# Patient Record
Sex: Male | Born: 1935
Health system: Southern US, Community
[De-identification: ages and names within clinical notes are randomized; demographics above are authoritative.]

## PROBLEM LIST (undated history)

## (undated) DIAGNOSIS — I452 Bifascicular block: Secondary | ICD-10-CM

## (undated) DIAGNOSIS — D649 Anemia, unspecified: Secondary | ICD-10-CM

## (undated) DIAGNOSIS — G4733 Obstructive sleep apnea (adult) (pediatric): Secondary | ICD-10-CM

## (undated) DIAGNOSIS — K5792 Diverticulitis of intestine, part unspecified, without perforation or abscess without bleeding: Secondary | ICD-10-CM

## (undated) DIAGNOSIS — R519 Headache, unspecified: Secondary | ICD-10-CM

## (undated) DIAGNOSIS — D509 Iron deficiency anemia, unspecified: Secondary | ICD-10-CM

## (undated) DIAGNOSIS — H269 Unspecified cataract: Secondary | ICD-10-CM

## (undated) DIAGNOSIS — C61 Malignant neoplasm of prostate: Secondary | ICD-10-CM

## (undated) DIAGNOSIS — H81319 Aural vertigo, unspecified ear: Secondary | ICD-10-CM

## (undated) DIAGNOSIS — K31819 Angiodysplasia of stomach and duodenum without bleeding: Secondary | ICD-10-CM

## (undated) DIAGNOSIS — K649 Unspecified hemorrhoids: Secondary | ICD-10-CM

## (undated) DIAGNOSIS — D126 Benign neoplasm of colon, unspecified: Secondary | ICD-10-CM

## (undated) DIAGNOSIS — I208 Other forms of angina pectoris: Secondary | ICD-10-CM

## (undated) DIAGNOSIS — E785 Hyperlipidemia, unspecified: Secondary | ICD-10-CM

## (undated) DIAGNOSIS — Z85828 Personal history of other malignant neoplasm of skin: Secondary | ICD-10-CM

## (undated) DIAGNOSIS — I2089 Other forms of angina pectoris: Secondary | ICD-10-CM

## (undated) DIAGNOSIS — K635 Polyp of colon: Secondary | ICD-10-CM

## (undated) DIAGNOSIS — M109 Gout, unspecified: Secondary | ICD-10-CM

## (undated) DIAGNOSIS — I5189 Other ill-defined heart diseases: Secondary | ICD-10-CM

## (undated) DIAGNOSIS — C44629 Squamous cell carcinoma of skin of left upper limb, including shoulder: Secondary | ICD-10-CM

## (undated) DIAGNOSIS — E861 Hypovolemia: Secondary | ICD-10-CM

## (undated) DIAGNOSIS — N529 Male erectile dysfunction, unspecified: Secondary | ICD-10-CM

## (undated) DIAGNOSIS — R0609 Other forms of dyspnea: Secondary | ICD-10-CM

## (undated) DIAGNOSIS — C4491 Basal cell carcinoma of skin, unspecified: Secondary | ICD-10-CM

## (undated) DIAGNOSIS — I700 Atherosclerosis of aorta: Secondary | ICD-10-CM

## (undated) DIAGNOSIS — I10 Essential (primary) hypertension: Secondary | ICD-10-CM

## (undated) DIAGNOSIS — K6389 Other specified diseases of intestine: Secondary | ICD-10-CM

## (undated) DIAGNOSIS — M791 Myalgia, unspecified site: Secondary | ICD-10-CM

## (undated) DIAGNOSIS — R7303 Prediabetes: Secondary | ICD-10-CM

## (undated) DIAGNOSIS — C44622 Squamous cell carcinoma of skin of right upper limb, including shoulder: Secondary | ICD-10-CM

## (undated) DIAGNOSIS — I2510 Atherosclerotic heart disease of native coronary artery without angina pectoris: Secondary | ICD-10-CM

## (undated) DIAGNOSIS — T466X5A Adverse effect of antihyperlipidemic and antiarteriosclerotic drugs, initial encounter: Secondary | ICD-10-CM

## (undated) HISTORY — DX: Hypovolemia: E86.1

## (undated) HISTORY — PX: OTHER SURGICAL HISTORY: SHX169

## (undated) HISTORY — DX: Polyp of colon: K63.5

## (undated) HISTORY — PX: CARPECTOMY: SHX5004

## (undated) HISTORY — PX: COLON SURGERY: SHX602

## (undated) HISTORY — DX: Headache, unspecified: R51.9

## (undated) HISTORY — PX: CARDIOVASCULAR STRESS TEST: SHX262

## (undated) HISTORY — DX: Diverticulitis of intestine, part unspecified, without perforation or abscess without bleeding: K57.92

## (undated) HISTORY — DX: Squamous cell carcinoma of skin of left upper limb, including shoulder: C44.629

## (undated) HISTORY — DX: Other specified diseases of intestine: K63.89

## (undated) HISTORY — PX: FOOT SURGERY: SHX648

## (undated) HISTORY — PX: CATARACT EXTRACTION W/ INTRAOCULAR LENS  IMPLANT, BILATERAL: SHX1307

## (undated) HISTORY — PX: CARDIAC SURGERY: SHX584

---
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
## 1984-03-27 HISTORY — PX: KNEE ARTHROSCOPY: SUR90

## 1997-03-27 HISTORY — PX: APPENDECTOMY: SHX54

## 1998-12-24 ENCOUNTER — Ambulatory Visit (HOSPITAL_BASED_OUTPATIENT_CLINIC_OR_DEPARTMENT_OTHER): Admission: RE | Admit: 1998-12-24 | Discharge: 1998-12-24 | Payer: Self-pay | Admitting: Orthopedic Surgery

## 2001-06-28 HISTORY — PX: BILATERAL CARPAL TUNNEL RELEASE: SHX6508

## 2001-06-28 HISTORY — PX: OLECRANON BURSA EXCISION: SUR541

## 2003-09-25 DIAGNOSIS — I214 Non-ST elevation (NSTEMI) myocardial infarction: Secondary | ICD-10-CM

## 2003-09-25 DIAGNOSIS — I25118 Atherosclerotic heart disease of native coronary artery with other forms of angina pectoris: Secondary | ICD-10-CM

## 2003-09-25 DIAGNOSIS — I2510 Atherosclerotic heart disease of native coronary artery without angina pectoris: Secondary | ICD-10-CM | POA: Insufficient documentation

## 2003-09-25 DIAGNOSIS — I252 Old myocardial infarction: Secondary | ICD-10-CM

## 2003-09-25 DIAGNOSIS — I25759 Atherosclerosis of native coronary artery of transplanted heart with unspecified angina pectoris: Secondary | ICD-10-CM

## 2003-09-25 HISTORY — DX: Non-ST elevation (NSTEMI) myocardial infarction: I21.4

## 2003-09-25 HISTORY — DX: Old myocardial infarction: I25.2

## 2003-09-25 HISTORY — DX: Atherosclerosis of native coronary artery of transplanted heart with unspecified angina pectoris: I25.759

## 2003-09-25 HISTORY — DX: Atherosclerotic heart disease of native coronary artery with other forms of angina pectoris: I25.118

## 2003-10-18 HISTORY — PX: LEFT HEART CATH AND CORONARY ANGIOGRAPHY: CATH118249

## 2003-10-19 DIAGNOSIS — Z951 Presence of aortocoronary bypass graft: Secondary | ICD-10-CM

## 2003-10-19 HISTORY — DX: Presence of aortocoronary bypass graft: Z95.1

## 2003-10-19 HISTORY — PX: CORONARY ARTERY BYPASS GRAFT: SHX141

## 2003-10-19 HISTORY — PX: LEFT HEART CATH AND CORONARY ANGIOGRAPHY: CATH118249

## 2004-04-14 ENCOUNTER — Inpatient Hospital Stay: Payer: Self-pay | Admitting: General Practice

## 2004-04-14 HISTORY — PX: TOTAL KNEE ARTHROPLASTY: SHX125

## 2004-04-20 ENCOUNTER — Ambulatory Visit: Payer: Self-pay | Admitting: Internal Medicine

## 2004-10-13 ENCOUNTER — Ambulatory Visit: Payer: Self-pay | Admitting: Ophthalmology

## 2004-10-18 ENCOUNTER — Ambulatory Visit: Payer: Self-pay | Admitting: Ophthalmology

## 2006-07-09 ENCOUNTER — Ambulatory Visit (HOSPITAL_COMMUNITY): Admission: RE | Admit: 2006-07-09 | Discharge: 2006-07-09 | Payer: Self-pay | Admitting: Orthopedic Surgery

## 2006-12-04 ENCOUNTER — Ambulatory Visit: Payer: Self-pay | Admitting: General Surgery

## 2006-12-31 DIAGNOSIS — C4491 Basal cell carcinoma of skin, unspecified: Secondary | ICD-10-CM

## 2006-12-31 HISTORY — DX: Basal cell carcinoma of skin, unspecified: C44.91

## 2007-04-16 ENCOUNTER — Ambulatory Visit: Payer: Self-pay | Admitting: Ophthalmology

## 2007-06-07 ENCOUNTER — Ambulatory Visit: Payer: Self-pay | Admitting: General Practice

## 2007-06-07 ENCOUNTER — Other Ambulatory Visit: Payer: Self-pay

## 2007-06-24 ENCOUNTER — Ambulatory Visit: Payer: Self-pay | Admitting: General Practice

## 2007-06-24 HISTORY — PX: OLECRANON BURSA EXCISION: SUR541

## 2008-08-12 ENCOUNTER — Emergency Department: Payer: Self-pay | Admitting: Unknown Physician Specialty

## 2009-08-04 ENCOUNTER — Ambulatory Visit: Payer: Self-pay | Admitting: General Practice

## 2009-08-25 ENCOUNTER — Ambulatory Visit: Payer: Self-pay | Admitting: General Practice

## 2009-09-06 ENCOUNTER — Ambulatory Visit: Payer: Self-pay | Admitting: General Practice

## 2010-11-08 ENCOUNTER — Ambulatory Visit: Payer: Self-pay | Admitting: General Practice

## 2010-11-08 DIAGNOSIS — I2510 Atherosclerotic heart disease of native coronary artery without angina pectoris: Secondary | ICD-10-CM

## 2010-11-21 ENCOUNTER — Inpatient Hospital Stay: Payer: Self-pay | Admitting: General Practice

## 2010-11-24 ENCOUNTER — Encounter: Payer: Self-pay | Admitting: Internal Medicine

## 2010-11-24 HISTORY — PX: TRANSTHORACIC ECHOCARDIOGRAM: SHX275

## 2010-11-26 ENCOUNTER — Encounter: Payer: Self-pay | Admitting: Internal Medicine

## 2010-12-26 ENCOUNTER — Encounter: Payer: Self-pay | Admitting: Internal Medicine

## 2011-03-28 LAB — HM COLONOSCOPY

## 2011-03-29 DIAGNOSIS — R972 Elevated prostate specific antigen [PSA]: Secondary | ICD-10-CM | POA: Diagnosis not present

## 2011-03-29 DIAGNOSIS — N402 Nodular prostate without lower urinary tract symptoms: Secondary | ICD-10-CM | POA: Diagnosis not present

## 2011-03-30 DIAGNOSIS — H8110 Benign paroxysmal vertigo, unspecified ear: Secondary | ICD-10-CM | POA: Diagnosis not present

## 2011-04-05 DIAGNOSIS — R972 Elevated prostate specific antigen [PSA]: Secondary | ICD-10-CM | POA: Diagnosis not present

## 2011-04-05 DIAGNOSIS — N402 Nodular prostate without lower urinary tract symptoms: Secondary | ICD-10-CM | POA: Diagnosis not present

## 2011-08-31 DIAGNOSIS — I059 Rheumatic mitral valve disease, unspecified: Secondary | ICD-10-CM | POA: Diagnosis not present

## 2011-08-31 DIAGNOSIS — I209 Angina pectoris, unspecified: Secondary | ICD-10-CM | POA: Diagnosis not present

## 2011-08-31 DIAGNOSIS — I2510 Atherosclerotic heart disease of native coronary artery without angina pectoris: Secondary | ICD-10-CM | POA: Diagnosis not present

## 2011-09-07 DIAGNOSIS — I209 Angina pectoris, unspecified: Secondary | ICD-10-CM | POA: Diagnosis not present

## 2011-09-15 DIAGNOSIS — H10509 Unspecified blepharoconjunctivitis, unspecified eye: Secondary | ICD-10-CM | POA: Diagnosis not present

## 2011-09-22 DIAGNOSIS — M19019 Primary osteoarthritis, unspecified shoulder: Secondary | ICD-10-CM | POA: Diagnosis not present

## 2011-10-04 DIAGNOSIS — R972 Elevated prostate specific antigen [PSA]: Secondary | ICD-10-CM | POA: Diagnosis not present

## 2011-10-04 DIAGNOSIS — N402 Nodular prostate without lower urinary tract symptoms: Secondary | ICD-10-CM | POA: Diagnosis not present

## 2011-10-05 DIAGNOSIS — H43819 Vitreous degeneration, unspecified eye: Secondary | ICD-10-CM | POA: Diagnosis not present

## 2011-10-11 DIAGNOSIS — N402 Nodular prostate without lower urinary tract symptoms: Secondary | ICD-10-CM | POA: Diagnosis not present

## 2011-10-11 DIAGNOSIS — R972 Elevated prostate specific antigen [PSA]: Secondary | ICD-10-CM | POA: Diagnosis not present

## 2011-10-25 DIAGNOSIS — J018 Other acute sinusitis: Secondary | ICD-10-CM | POA: Diagnosis not present

## 2011-10-25 DIAGNOSIS — H8110 Benign paroxysmal vertigo, unspecified ear: Secondary | ICD-10-CM | POA: Diagnosis not present

## 2011-11-02 DIAGNOSIS — Z96659 Presence of unspecified artificial knee joint: Secondary | ICD-10-CM | POA: Diagnosis not present

## 2011-12-22 DIAGNOSIS — H8110 Benign paroxysmal vertigo, unspecified ear: Secondary | ICD-10-CM | POA: Diagnosis not present

## 2011-12-29 DIAGNOSIS — M129 Arthropathy, unspecified: Secondary | ICD-10-CM | POA: Diagnosis not present

## 2011-12-29 DIAGNOSIS — M109 Gout, unspecified: Secondary | ICD-10-CM | POA: Diagnosis not present

## 2011-12-29 DIAGNOSIS — R42 Dizziness and giddiness: Secondary | ICD-10-CM | POA: Diagnosis not present

## 2011-12-29 DIAGNOSIS — I2510 Atherosclerotic heart disease of native coronary artery without angina pectoris: Secondary | ICD-10-CM | POA: Diagnosis not present

## 2011-12-29 DIAGNOSIS — M19049 Primary osteoarthritis, unspecified hand: Secondary | ICD-10-CM | POA: Diagnosis not present

## 2012-01-03 DIAGNOSIS — M109 Gout, unspecified: Secondary | ICD-10-CM | POA: Diagnosis not present

## 2012-01-03 DIAGNOSIS — M6530 Trigger finger, unspecified finger: Secondary | ICD-10-CM | POA: Diagnosis not present

## 2012-01-03 DIAGNOSIS — M25549 Pain in joints of unspecified hand: Secondary | ICD-10-CM | POA: Diagnosis not present

## 2012-01-11 ENCOUNTER — Encounter: Payer: Self-pay | Admitting: Rheumatology

## 2012-01-11 DIAGNOSIS — IMO0001 Reserved for inherently not codable concepts without codable children: Secondary | ICD-10-CM | POA: Diagnosis not present

## 2012-01-11 DIAGNOSIS — M109 Gout, unspecified: Secondary | ICD-10-CM | POA: Diagnosis not present

## 2012-01-11 DIAGNOSIS — M25649 Stiffness of unspecified hand, not elsewhere classified: Secondary | ICD-10-CM | POA: Diagnosis not present

## 2012-01-18 DIAGNOSIS — IMO0001 Reserved for inherently not codable concepts without codable children: Secondary | ICD-10-CM | POA: Diagnosis not present

## 2012-01-18 DIAGNOSIS — M25649 Stiffness of unspecified hand, not elsewhere classified: Secondary | ICD-10-CM | POA: Diagnosis not present

## 2012-01-18 DIAGNOSIS — M109 Gout, unspecified: Secondary | ICD-10-CM | POA: Diagnosis not present

## 2012-01-25 DIAGNOSIS — IMO0001 Reserved for inherently not codable concepts without codable children: Secondary | ICD-10-CM | POA: Diagnosis not present

## 2012-01-25 DIAGNOSIS — M109 Gout, unspecified: Secondary | ICD-10-CM | POA: Diagnosis not present

## 2012-01-25 DIAGNOSIS — M25649 Stiffness of unspecified hand, not elsewhere classified: Secondary | ICD-10-CM | POA: Diagnosis not present

## 2012-03-07 DIAGNOSIS — I10 Essential (primary) hypertension: Secondary | ICD-10-CM | POA: Diagnosis not present

## 2012-03-07 DIAGNOSIS — I2510 Atherosclerotic heart disease of native coronary artery without angina pectoris: Secondary | ICD-10-CM | POA: Diagnosis not present

## 2012-03-07 DIAGNOSIS — E782 Mixed hyperlipidemia: Secondary | ICD-10-CM | POA: Diagnosis not present

## 2012-03-07 DIAGNOSIS — G4730 Sleep apnea, unspecified: Secondary | ICD-10-CM | POA: Diagnosis not present

## 2012-03-28 DIAGNOSIS — M999 Biomechanical lesion, unspecified: Secondary | ICD-10-CM | POA: Diagnosis not present

## 2012-03-28 DIAGNOSIS — M5380 Other specified dorsopathies, site unspecified: Secondary | ICD-10-CM | POA: Diagnosis not present

## 2012-03-28 DIAGNOSIS — M533 Sacrococcygeal disorders, not elsewhere classified: Secondary | ICD-10-CM | POA: Diagnosis not present

## 2012-03-29 DIAGNOSIS — M5380 Other specified dorsopathies, site unspecified: Secondary | ICD-10-CM | POA: Diagnosis not present

## 2012-03-29 DIAGNOSIS — M533 Sacrococcygeal disorders, not elsewhere classified: Secondary | ICD-10-CM | POA: Diagnosis not present

## 2012-03-29 DIAGNOSIS — M999 Biomechanical lesion, unspecified: Secondary | ICD-10-CM | POA: Diagnosis not present

## 2012-04-10 DIAGNOSIS — R972 Elevated prostate specific antigen [PSA]: Secondary | ICD-10-CM | POA: Diagnosis not present

## 2012-04-18 DIAGNOSIS — R972 Elevated prostate specific antigen [PSA]: Secondary | ICD-10-CM | POA: Diagnosis not present

## 2012-04-18 DIAGNOSIS — N402 Nodular prostate without lower urinary tract symptoms: Secondary | ICD-10-CM | POA: Diagnosis not present

## 2012-05-08 DIAGNOSIS — Z8546 Personal history of malignant neoplasm of prostate: Secondary | ICD-10-CM

## 2012-05-08 DIAGNOSIS — R972 Elevated prostate specific antigen [PSA]: Secondary | ICD-10-CM | POA: Diagnosis not present

## 2012-05-08 DIAGNOSIS — C61 Malignant neoplasm of prostate: Secondary | ICD-10-CM

## 2012-05-08 DIAGNOSIS — IMO0002 Reserved for concepts with insufficient information to code with codable children: Secondary | ICD-10-CM | POA: Diagnosis not present

## 2012-05-08 HISTORY — DX: Malignant neoplasm of prostate: C61

## 2012-05-08 HISTORY — DX: Personal history of malignant neoplasm of prostate: Z85.46

## 2012-05-15 DIAGNOSIS — C61 Malignant neoplasm of prostate: Secondary | ICD-10-CM | POA: Diagnosis not present

## 2012-05-23 DIAGNOSIS — C61 Malignant neoplasm of prostate: Secondary | ICD-10-CM | POA: Diagnosis not present

## 2012-06-05 ENCOUNTER — Encounter: Payer: Self-pay | Admitting: Radiation Oncology

## 2012-06-05 ENCOUNTER — Ambulatory Visit
Admission: RE | Admit: 2012-06-05 | Discharge: 2012-06-05 | Disposition: A | Discharge status: Home / Self Care | Payer: Medicare Other | Source: Ambulatory Visit | Attending: Radiation Oncology | Admitting: Radiation Oncology

## 2012-06-05 VITALS — BP 124/75 | HR 75 | Temp 98.8°F | Resp 20 | Ht 70.5 in | Wt 232.7 lb

## 2012-06-05 DIAGNOSIS — Z951 Presence of aortocoronary bypass graft: Secondary | ICD-10-CM | POA: Insufficient documentation

## 2012-06-05 DIAGNOSIS — I252 Old myocardial infarction: Secondary | ICD-10-CM | POA: Diagnosis not present

## 2012-06-05 DIAGNOSIS — Z79899 Other long term (current) drug therapy: Secondary | ICD-10-CM | POA: Diagnosis not present

## 2012-06-05 DIAGNOSIS — I10 Essential (primary) hypertension: Secondary | ICD-10-CM | POA: Insufficient documentation

## 2012-06-05 DIAGNOSIS — E785 Hyperlipidemia, unspecified: Secondary | ICD-10-CM | POA: Insufficient documentation

## 2012-06-05 DIAGNOSIS — C61 Malignant neoplasm of prostate: Secondary | ICD-10-CM | POA: Insufficient documentation

## 2012-06-05 HISTORY — DX: Hyperlipidemia, unspecified: E78.5

## 2012-06-05 HISTORY — DX: Malignant neoplasm of prostate: C61

## 2012-06-05 HISTORY — DX: Male erectile dysfunction, unspecified: N52.9

## 2012-06-05 NOTE — Progress Notes (Addendum)
New Consult Prostate Adenocarcinoma Bx 05/08/12 Gleason 3+3=6,PSA=8.26,Volume=57cc  Elevated PSA 9/11= 6.5, 10/11=4.75 s/p antibiotic  Interested in [employer]plantation  Divorced,  Lives with significant other 29 years,Retired, 1 son, no dysuria, gets up 1x night, regular bowel movements, sometimes sciatic pain at night, no nausea,   Allergies:Indocin=tremors,PCN=itching,rash

## 2012-06-05 NOTE — Progress Notes (Signed)
Radiation Oncology         [phone]) [phone] ________________________________  Initial outpatient Consultation  Name: Phillip Nelson MRN: [id number]  Date: 06/05/2012  DOB: [birth year]  CC:No primary provider on file.  Garnett Farm, MD   REFERRING PHYSICIAN: Garnett Farm, MD  DIAGNOSIS: 78 y.o. gentleman with stage T2a adenocarcinoma of the prostate with a Gleason's score of 3+3 and a PSA of 8.26  HISTORY OF PRESENT ILLNESS::Phillip Nelson is a 78 y.o. gentleman.  He was noted to have an elevated PSA of 6.5 in September 2011. After a course of antibiotics, his PSA decreased to 4.75. Therefore, he was carefully monitored by his primary care physician, Dr. Lonna Cobb.  Most recently, the patient was noted to have some firmness in the left side of the prostate.  Accordingly, he was referred for evaluation in urology by Dr. Vernie Ammons on 10/11/2011,  digital rectal examination was performed at that time revealing very subtle firmness along the left lateral aspect of the left lobe of the mid prostate.  The patient proceeded to transrectal ultrasound with 12 biopsies of the prostate on 05/08/2012.  The prostate volume measured 57 cc.  Out of 12 core biopsies, 5 were positive.  The maximum Gleason score was 3+3, and this was seen in the right lateral mid, right lateral apex, left apex, left lateral mid, and left lateral apex in quantities ranging from 5% to 80%.  The patient reviewed the biopsy results with his urologist and he has kindly been referred today for discussion of potential radiation treatment options.  PREVIOUS RADIATION THERAPY: No  PAST MEDICAL HISTORY:  has a past medical history of Prostate cancer (05/08/12); Arthritis; Vertigo; CABG; Hypertension; Gout; Hyperlipidemia; Osteoarthritis, knee; Skin cancer; Status post foot surgery; History of knee replacement, total; Cataract; ED (erectile dysfunction); Allergy; and Myocardial infarction (2005).    PAST SURGICAL HISTORY: Past Surgical  History  Procedure Laterality Date  . Appendectomy    . Arthoscopic elbow Left   . Arthoscopic knee Left   . Carpectomy Right     wrist  . Cataract surgery Bilateral   . Coronary artery bypass graft  2005    FAMILY HISTORY: family history includes Alzheimer's disease in his father and Cancer (age of onset: 51) in his sister.  SOCIAL HISTORY:  reports that he has quit smoking. He has never used smokeless tobacco. He reports that  drinks alcohol. He reports that he does not use illicit drugs.  ALLERGIES: Indocin and Penicillins  MEDICATIONS:  Current Outpatient Prescriptions  Medication Sig Dispense Refill  . allopurinol (ZYLOPRIM) 300 MG tablet Take 300 mg by mouth daily.      . meclizine (ANTIVERT) 25 MG tablet Take 25 mg by mouth as needed.      . naproxen sodium (ANAPROX) 220 MG tablet Take 220 mg by mouth 2 (two) times daily with a meal.      . Potassium 99 MG TABS Take 5 tablets by mouth daily. OTC      . vitamin B-12 (CYANOCOBALAMIN) 100 MCG tablet Take 50 mcg by mouth as needed.       No current facility-administered medications for this encounter.    REVIEW OF SYSTEMS:  A 15 point review of systems is documented in the electronic medical record. This was obtained by the nursing staff. However, I reviewed this with the patient to discuss relevant findings and make appropriate changes.  A comprehensive review of systems was negative..  The patient completed an IPSS and IIEF questionnaire.  His IPSS score was 6 indicating mild urinary outflow obstructive symptoms.  He indicated that his erectile function is unable to initiate sexual activity.   PHYSICAL EXAM: This patient is in no acute distress.  He is alert and oriented.   height is 5' 10.5" (1.791 m) and weight is 232 lb 11.2 oz (105.552 kg). His oral temperature is 98.8 F (37.1 C). His blood pressure is 124/75 and his pulse is 75. His respiration is 20.  He exhibits no respiratory distress or labored breathing.  He appears  neurologically intact.  His mood is pleasant.  His affect is appropriate.  Please note the digital rectal exam findings described above.  LABORATORY DATA:  No results found for this basename: WBC, HGB, HCT, MCV, PLT   No results found for this basename: NA, K, CL, CO2   No results found for this basename: ALT, AST, GGT, ALKPHOS, BILITOT     RADIOGRAPHY: No results found.    IMPRESSION: This gentleman is a 78 y.o. gentleman with stage T2a adenocarcinoma of the prostate with a Gleason's score of 3+3 and a PSA of 8.26.  His T-Stage, Gleason's Score, and PSA put him into the favorable risk group.  Accordingly he is eligible for a variety of potential treatment options including active surveillance, prostatectomy, external radiation or seed implant.  PLAN:Today I reviewed the findings and workup thus far.  We discussed the natural history of prostate cancer.  We reviewed the the implications of T-stage, Gleason's Score, and PSA on decision-making and outcomes in prostate cancer.  We discussed radiation treatment in the management of prostate cancer with regard to the logistics and delivery of external beam radiation treatment as well as the logistics and delivery of prostate brachytherapy.  We compared and contrasted each of these approaches and also compared these against prostatectomy.  The patient expressed interest in prostate brachytherapy.  I filled out a patient counseling form for him with relevant treatment diagrams and we retained a copy for our records.   The patient would like to proceed with prostate brachytherapy.  I will share my findings with Dr. Vernie Ammons and move forward with scheduling the procedure in the near future.     I enjoyed meeting with him today, and will look forward to participating in the care of this very nice gentleman.  I spent 60 minutes face to face with the patient and more than 50% of that time was spent in counseling and/or coordination of care.     ------------------------------------------------  Artist Pais. Kathrynn Running, M.D.

## 2012-06-05 NOTE — Progress Notes (Signed)
Please see the Nurse Progress Note in the MD Initial Consult Encounter for this patient. 

## 2012-06-06 ENCOUNTER — Other Ambulatory Visit: Payer: Self-pay | Admitting: Urology

## 2012-06-06 ENCOUNTER — Telehealth: Payer: Self-pay | Admitting: *Deleted

## 2012-06-06 NOTE — Telephone Encounter (Signed)
Called patient to inform of appts and implant , spoke with patient and he is aware of these appts. And implant.

## 2012-06-20 ENCOUNTER — Telehealth: Payer: Self-pay | Admitting: *Deleted

## 2012-06-20 NOTE — Telephone Encounter (Signed)
CALLED PATIENT TO REMIND OF APPT. FOR 06-21-12, CONFIRMED APPT. WITH PATIENT.

## 2012-06-21 ENCOUNTER — Ambulatory Visit
Admission: RE | Admit: 2012-06-21 | Discharge: 2012-06-21 | Disposition: A | Discharge status: Home / Self Care | Payer: Medicare Other | Source: Ambulatory Visit | Attending: Radiation Oncology | Admitting: Radiation Oncology

## 2012-06-21 ENCOUNTER — Ambulatory Visit (HOSPITAL_COMMUNITY)
Admission: RE | Admit: 2012-06-21 | Discharge: 2012-06-21 | Disposition: A | Discharge status: Home / Self Care | Payer: Medicare Other | Source: Ambulatory Visit | Attending: Urology | Admitting: Urology

## 2012-06-21 DIAGNOSIS — Z01818 Encounter for other preprocedural examination: Secondary | ICD-10-CM | POA: Insufficient documentation

## 2012-06-21 DIAGNOSIS — C61 Malignant neoplasm of prostate: Secondary | ICD-10-CM | POA: Diagnosis not present

## 2012-06-21 DIAGNOSIS — Z87891 Personal history of nicotine dependence: Secondary | ICD-10-CM | POA: Diagnosis not present

## 2012-06-21 NOTE — Progress Notes (Signed)
  Radiation Oncology         [phone] ________________________________  Name: Phillip Nelson MRN: [id number]  Date: 06/21/2012  DOB: Aug 26, 1934  SIMULATION AND TREATMENT PLANNING NOTE PUBIC ARCH STUDY  CC:No primary provider on file.  Garnett Farm, MD  DIAGNOSIS: 78 y.o. gentleman with stage T2a adenocarcinoma of the prostate with a Gleason's score of 3+3 and a PSA of 8.26  COMPLEX SIMULATION:  The patient presented today for evaluation for possible prostate seed implant. He was brought to the radiation planning suite and placed supine on the CT couch. A 3-dimensional image study set was obtained in upload to the planning computer. There, on each axial slice, I contoured the prostate gland. Then, using three-dimensional radiation planning tools I reconstructed the prostate in view of the structures from the transperineal needle pathway to assess for possible pubic arch interference. In doing so, I did not appreciate any pubic arch interference. Also, the patient's prostate volume was estimated based on the drawn structure. The volume was 44.9 cc.  Given the pubic arch appearance and prostate volume, patient remains a good candidate to proceed with prostate seed implant. Today, he freely provided informed written consent to proceed.    PLAN: The patient will undergo prostate seed implant.   ________________________________  Artist Pais. Kathrynn Running, M.D.

## 2012-07-25 ENCOUNTER — Telehealth: Payer: Self-pay | Admitting: *Deleted

## 2012-07-25 NOTE — Telephone Encounter (Signed)
CALLED PATIENT TO REMIND OF APPT. FOR 07-26-12, SPOKE WITH PATIENT AND HE IS AWARE OF THIS APPT. 

## 2012-07-26 DIAGNOSIS — Z85828 Personal history of other malignant neoplasm of skin: Secondary | ICD-10-CM | POA: Diagnosis not present

## 2012-07-26 DIAGNOSIS — E785 Hyperlipidemia, unspecified: Secondary | ICD-10-CM | POA: Diagnosis not present

## 2012-07-26 DIAGNOSIS — I252 Old myocardial infarction: Secondary | ICD-10-CM | POA: Diagnosis not present

## 2012-07-26 DIAGNOSIS — C61 Malignant neoplasm of prostate: Secondary | ICD-10-CM | POA: Diagnosis not present

## 2012-07-26 DIAGNOSIS — Z79899 Other long term (current) drug therapy: Secondary | ICD-10-CM | POA: Diagnosis not present

## 2012-07-26 DIAGNOSIS — Z951 Presence of aortocoronary bypass graft: Secondary | ICD-10-CM | POA: Diagnosis not present

## 2012-07-26 DIAGNOSIS — I10 Essential (primary) hypertension: Secondary | ICD-10-CM | POA: Diagnosis not present

## 2012-07-26 LAB — COMPREHENSIVE METABOLIC PANEL
ALT: 41 U/L (ref 0–53)
AST: 40 U/L — ABNORMAL HIGH (ref 0–37)
Albumin: 4 g/dL (ref 3.5–5.2)
Alkaline Phosphatase: 74 U/L (ref 39–117)
BUN: 12 mg/dL (ref 6–23)
CO2: 25 mEq/L (ref 19–32)
Calcium: 9.8 mg/dL (ref 8.4–10.5)
Chloride: 101 mEq/L (ref 96–112)
Creatinine, Ser: 0.92 mg/dL (ref 0.50–1.35)
GFR calc Af Amer: >90 mL/min (ref >90)
GFR calc non Af Amer: 80 mL/min — ABNORMAL LOW (ref >90)
Glucose, Bld: 93 mg/dL (ref 70–99)
Potassium: 5.2 mEq/L — ABNORMAL HIGH (ref 3.5–5.1)
Sodium: 136 mEq/L (ref 135–145)
Total Bilirubin: 0.7 mg/dL (ref 0.3–1.2)
Total Protein: 8.2 g/dL (ref 6.0–8.3)

## 2012-07-26 LAB — CBC
HCT: 46.4 % (ref 39.0–52.0)
Hemoglobin: 16.2 g/dL (ref 13.0–17.0)
MCH: 33.3 pg (ref 26.0–34.0)
MCHC: 34.9 g/dL (ref 30.0–36.0)
MCV: 95.3 fL (ref 78.0–100.0)
Platelets: 231 10*3/uL (ref 150–400)
RBC: 4.87 MIL/uL (ref 4.22–5.81)
RDW: 12.9 % (ref 11.5–15.5)
WBC: 12.2 10*3/uL — ABNORMAL HIGH (ref 4.0–10.5)

## 2012-07-26 LAB — PROTIME-INR
INR: 0.93 (ref 0.00–1.49)
Prothrombin Time: 12.4 seconds (ref 11.6–15.2)

## 2012-07-26 LAB — APTT: aPTT: 23 seconds — ABNORMAL LOW (ref 24–37)

## 2012-07-30 ENCOUNTER — Encounter (HOSPITAL_BASED_OUTPATIENT_CLINIC_OR_DEPARTMENT_OTHER): Payer: Self-pay | Admitting: *Deleted

## 2012-07-31 ENCOUNTER — Encounter (HOSPITAL_BASED_OUTPATIENT_CLINIC_OR_DEPARTMENT_OTHER): Payer: Self-pay | Admitting: *Deleted

## 2012-07-31 NOTE — Progress Notes (Signed)
NPO AFTER MN. ARRIVES AT 0615. CURRENT LAB RESULTS, CXR, AND EKG IN EPIC AND CHART. WILL DO FLEET ENEMA AM OF SURG.

## 2012-08-01 ENCOUNTER — Telehealth: Payer: Self-pay | Admitting: *Deleted

## 2012-08-01 DIAGNOSIS — C61 Malignant neoplasm of prostate: Secondary | ICD-10-CM | POA: Diagnosis not present

## 2012-08-01 NOTE — Anesthesia Preprocedure Evaluation (Addendum)
Anesthesia Evaluation  Patient identified by MRN, date of birth, ID band Patient awake    Reviewed: Allergy & Precautions, H&P , NPO status , Patient's Chart, lab work & pertinent test results  Airway Mallampati: III TM Distance: >3 FB Neck ROM: Full    Dental  (+) Teeth Intact, Partial Upper and Dental Advisory Given   Pulmonary sleep apnea (Noncompliant with CPAP) , former smoker,    Pulmonary exam normal       Cardiovascular + CAD and + CABG Rhythm:Regular Rate:Normal     Neuro/Psych negative neurological ROS  negative psych ROS   GI/Hepatic negative GI ROS, Neg liver ROS,   Endo/Other  negative endocrine ROS  Renal/GU negative Renal ROS  negative genitourinary   Musculoskeletal negative musculoskeletal ROS (+)   Abdominal   Peds  Hematology negative hematology ROS (+)   Anesthesia Other Findings   Reproductive/Obstetrics                          Anesthesia Physical Anesthesia Plan  ASA: III  Anesthesia Plan: General   Post-op Pain Management:    Induction: Intravenous  Airway Management Planned: LMA  Additional Equipment:   Intra-op Plan:   Post-operative Plan: Extubation in OR  Informed Consent: I have reviewed the patients History and Physical, chart, labs and discussed the procedure including the risks, benefits and alternatives for the proposed anesthesia with the patient or authorized representative who has indicated his/her understanding and acceptance.   Dental advisory given  Plan Discussed with: CRNA  Anesthesia Plan Comments:         Anesthesia Quick Evaluation

## 2012-08-01 NOTE — Telephone Encounter (Signed)
Called patient to remind of procedure for 08-02-12, spoke with patient and he is aware of this procedure. 

## 2012-08-01 NOTE — H&P (Signed)
Elevated PSA: In 9/11 his PSA was found to be 6.5. He was placed on a 30 day course of doxycycline and his PSA was repeated and found to be 4.75. He was seen and evaluated by Dr. Lonna Cobb who felt there was some firmness to the prostate on the left-hand side and recommended a prostate biopsy. I noted some subtle firmness in the lateral aspect of the left lobe of his prostate as well. We discussed evaluation with transrectal ultrasound and biopsy versus continued close observation with serial DRE and PSA and he elected to proceed with observation initially do to an further elevation of his PSA to 8.26 he elected to proceed with biopsy. TRUS/BX 05/08/12: Prostate volume - 57 cc  Pathology: Adenocarcinoma Gleason score 3+3 = 6 in 5/12 cores bilaterally.  Interval history: No new urologic complaints are noted today. He had no difficulties following his prostate biopsy.   Past Medical History Problems  1. History of  Acute Myocardial Infarction V12.59 2. History of  Arthritis V13.4 3. History of  Aural Vertigo 386.19 4. History of  Autologous Artery Coronary Bypass Graft Stenosis 414.02 5. History of  Benign Essential Hypertension 401.1 6. History of  Gout 274.00 7. History of  Hyperlipidemia 272.4 8. History of  Hypertension 401.9 9. History of  Internal Derangement Of Posterior Horn Of Medial Meniscus 717.2 10. History of  Osteoarthritis Of The Knee 715.96 11. History of  Skin Cancer V10.83  Surgical History Problems  1. History of  Appendectomy 2. History of  Arthroscopy Elbow Left 3. History of  Arthroscopy Knee Left 4. History of  Biopsy Of The Prostate Needle 5. History of  Biopsy Skin Right 6. History of  CABG (CABG) V45.81 7. History of  Cataract Surgery Bilateral 8. History of  Curettage Of Left 1st Toe Phalanges 9. History of  Foot Surgery Left 10. History of  Foot Surgery Right 11. History of  Heart Surgery 12. History of  Knee Replacement Right 13. History of  Knee Replacement  Left 14. History of  Wrist Carpectomy Right  Current Meds 1. Aleve TABS; Therapy: (Recorded:11Jan2012) to 2. Allopurinol TABS; Therapy: (Recorded:11Jan2012) to 3. Aspirin 81 MG Oral Tablet; Therapy: (Recorded:11Jan2012) to 4. Azithromycin 250 MG Oral Tablet; Therapy: 14Jan2014 to 5. Gentamicin Sulfate 40 MG/ML Injection Solution; INJECT 80 MG Intramuscular; To Be Done:  12Feb2014; Status: HOLD FOR - Administration 6. Hydrocodone-Acetaminophen 5-325 MG Oral Tablet; Therapy: 27Dec2013 to 7. Levofloxacin 500 MG Oral Tablet; 1 po q day beginning the day prior to biopsy; Therapy:  23Jan2014 to (Evaluate:26Jan2014)  Requested for: 23Jan2014; Last Rx:23Jan2014 8. Meclizine HCl 25 MG Oral Tablet; Therapy: 02Oct2013 to 9. Pravastatin Sodium 20 MG Oral Tablet; Therapy: 12Dec2013 to 10. Vitamin B12 TABS; Therapy: (Recorded:09Jan2013) to  Allergies Medication  1. Indocin CAPS 2. Penicillins  Family History Problems  1. Paternal history of  Alzheimer's Disease 2. Maternal history of  Cerebral Artery Aneurysm 3. Family history of  Family Health Status Number Of Children 4. Family history of  Father Deceased At Age ____ Died at 46 from Alzhiemers 5. Family history of  Mother Deceased At Age ____ Died at 84 from cerebral bleed  Social History Problems  1. Caffeine Use 2. Former Smoker V15.82 3 ppd x 25 years at least 3. Marital History - Divorced V61.03 4. Occupation: retired Nurse, children's  5. History of  Alcohol Use 6. History of  Drug Use  Vitals Vital Signs BMI Calculated: 31.78 BSA Calculated: 2.18 Height: 5 ft 10 in Weight: 222  lb  Blood Pressure: 132 / 82 Heart Rate: 66  Review of Systems Genitourinary, constitutional, skin, eye, otolaryngeal, hematologic/lymphatic, cardiovascular, pulmonary, endocrine, musculoskeletal, gastrointestinal, neurological and psychiatric system(s) were reviewed and pertinent findings if present are noted.  Genitourinary: nocturia and erectile  dysfunction.  Physical Exam Constitutional: Well nourished and well developed. No acute distress.  ENT:. The ears and nose are normal in appearance.  Neck: The appearance of the neck is normal and no neck mass is present.  Pulmonary: No respiratory distress and normal respiratory rhythm and effort.  Cardiovascular: Heart rate and rhythm are normal. No peripheral edema.  Abdomen: The abdomen is soft and nontender. No masses are palpated. No CVA tenderness. No hernias are palpable. No hepatosplenomegaly noted.  Rectal: Rectal exam demonstrates normal sphincter tone, no tenderness and no masses. He had a very subtle firmness to the lateral aspect of the left lobe of the mid- prostate. The prostate has a palpable nodule and is not tender. The left seminal vesicle is nonpalpable. The right seminal vesicle is nonpalpable. The perineum is normal on inspection.  Genitourinary: Examination of the penis demonstrates no discharge, no masses, no lesions and a normal meatus. The scrotum is without lesions. The right epididymis is palpably normal and non-tender. The left epididymis is palpably normal and non-tender. The right testis is non-tender and without masses. The left testis is non-tender and without masses.  Lymphatics: The femoral and inguinal nodes are not enlarged or tender.  Skin: Normal skin turgor, no visible rash and no visible skin lesions.  Neuro/Psych:. Mood and affect are appropriate.   Plan Adenocarcinoma Of The Prostate Gland (185)   The patient was counseled about the natural history of prostate cancer and the standard treatment options that are available for prostate cancer. It was explained to him how his age and life expectancy, clinical stage, Gleason score, and PSA affect his prognosis, the decision to proceed with additional staging studies, as well as how that information influences recommended treatment strategies. We discussed the roles for active surveillance, radiation therapy,  surgical therapy, androgen deprivation, as well as ablative therapy options for the treatment of prostate cancer as appropriate to his individual cancer situation. We discussed the risks and benefits of these options with regard to their impact on cancer control and also in terms of potential adverse events, complications, and impact on quiality of life particularly related to urinary, bowel, and sexual function. The patient was encouraged to ask questions throughout the discussion today and all questions were answered to his stated satisfaction. In addition, the patient was provided with and/or directed to appropriate resources and literature for further education about prostate cancer and treatment options.   He came in with his wife and brought a notebook with questions which I have answered to his satisfaction. We discussed the forms of radiation therapy available and he has elected to proceed with radioactive seeds. We did discuss the risks and potential side effects of radiation therapy.

## 2012-08-02 ENCOUNTER — Ambulatory Visit (HOSPITAL_BASED_OUTPATIENT_CLINIC_OR_DEPARTMENT_OTHER)
Admission: RE | Admit: 2012-08-02 | Discharge: 2012-08-02 | Disposition: A | Discharge status: Home / Self Care | Payer: Medicare Other | Source: Ambulatory Visit | Attending: Urology | Admitting: Urology

## 2012-08-02 ENCOUNTER — Encounter (HOSPITAL_BASED_OUTPATIENT_CLINIC_OR_DEPARTMENT_OTHER): Payer: Self-pay | Admitting: Anesthesiology

## 2012-08-02 ENCOUNTER — Other Ambulatory Visit: Payer: Self-pay

## 2012-08-02 ENCOUNTER — Ambulatory Visit (HOSPITAL_COMMUNITY): Payer: Medicare Other

## 2012-08-02 ENCOUNTER — Ambulatory Visit (HOSPITAL_BASED_OUTPATIENT_CLINIC_OR_DEPARTMENT_OTHER): Payer: Medicare Other | Admitting: Anesthesiology

## 2012-08-02 ENCOUNTER — Encounter (HOSPITAL_BASED_OUTPATIENT_CLINIC_OR_DEPARTMENT_OTHER): Payer: Self-pay | Admitting: *Deleted

## 2012-08-02 ENCOUNTER — Encounter (HOSPITAL_BASED_OUTPATIENT_CLINIC_OR_DEPARTMENT_OTHER)
Admission: RE | Disposition: A | Discharge status: Home / Self Care | Payer: Self-pay | Source: Ambulatory Visit | Attending: Urology

## 2012-08-02 DIAGNOSIS — I252 Old myocardial infarction: Secondary | ICD-10-CM | POA: Insufficient documentation

## 2012-08-02 DIAGNOSIS — I10 Essential (primary) hypertension: Secondary | ICD-10-CM | POA: Insufficient documentation

## 2012-08-02 DIAGNOSIS — C61 Malignant neoplasm of prostate: Secondary | ICD-10-CM | POA: Diagnosis not present

## 2012-08-02 DIAGNOSIS — Z951 Presence of aortocoronary bypass graft: Secondary | ICD-10-CM | POA: Insufficient documentation

## 2012-08-02 DIAGNOSIS — Z85828 Personal history of other malignant neoplasm of skin: Secondary | ICD-10-CM | POA: Insufficient documentation

## 2012-08-02 DIAGNOSIS — Z79899 Other long term (current) drug therapy: Secondary | ICD-10-CM | POA: Insufficient documentation

## 2012-08-02 DIAGNOSIS — E785 Hyperlipidemia, unspecified: Secondary | ICD-10-CM | POA: Insufficient documentation

## 2012-08-02 HISTORY — DX: Obstructive sleep apnea (adult) (pediatric): G47.33

## 2012-08-02 HISTORY — PX: RADIOACTIVE SEED IMPLANT: SHX5150

## 2012-08-02 HISTORY — DX: Gout, unspecified: M10.9

## 2012-08-02 HISTORY — DX: Aural vertigo, unspecified ear: H81.319

## 2012-08-02 HISTORY — DX: Personal history of other malignant neoplasm of skin: Z85.828

## 2012-08-02 HISTORY — DX: Atherosclerotic heart disease of native coronary artery without angina pectoris: I25.10

## 2012-08-02 SURGERY — INSERTION, RADIATION SOURCE, PROSTATE
Anesthesia: General | Site: Prostate | Wound class: Clean Contaminated

## 2012-08-02 MED ORDER — EPHEDRINE SULFATE 50 MG/ML IJ SOLN
INTRAMUSCULAR | Status: DC | PRN
  Administered 2012-08-02: 10 mg via INTRAVENOUS

## 2012-08-02 MED ORDER — FLEET ENEMA 7-19 GM/118ML RE ENEM
1.0000 | ENEMA | Freq: Once | RECTAL | Status: DC
  Filled 2012-08-02: qty 1

## 2012-08-02 MED ORDER — PROPOFOL 10 MG/ML IV BOLUS
INTRAVENOUS | Status: DC | PRN
  Administered 2012-08-02: 250 mg via INTRAVENOUS
  Administered 2012-08-02: 20 mg via INTRAVENOUS

## 2012-08-02 MED ORDER — HYDROCODONE-ACETAMINOPHEN 10-325 MG PO TABS: 1.0000 | ORAL_TABLET | Freq: Four times a day (QID) | ORAL | Status: DC | PRN

## 2012-08-02 MED ORDER — IOHEXOL 350 MG/ML SOLN
INTRAVENOUS | Status: DC | PRN
  Administered 2012-08-02: 7 mL

## 2012-08-02 MED ORDER — FENTANYL CITRATE 0.05 MG/ML IJ SOLN
INTRAMUSCULAR | Status: DC | PRN
  Administered 2012-08-02: 12.5 ug via INTRAVENOUS
  Administered 2012-08-02 (×2): 25 ug via INTRAVENOUS
  Administered 2012-08-02: 50 ug via INTRAVENOUS
  Administered 2012-08-02: 12.5 ug via INTRAVENOUS
  Administered 2012-08-02 (×2): 25 ug via INTRAVENOUS
  Administered 2012-08-02 (×2): 12.5 ug via INTRAVENOUS
  Administered 2012-08-02: 25 ug via INTRAVENOUS

## 2012-08-02 MED ORDER — ACETAMINOPHEN 10 MG/ML IV SOLN
INTRAVENOUS | Status: DC | PRN
  Administered 2012-08-02: 1000 mg via INTRAVENOUS

## 2012-08-02 MED ORDER — PROMETHAZINE HCL 25 MG/ML IJ SOLN
6.2500 mg | INTRAMUSCULAR | Status: DC | PRN
  Filled 2012-08-02: qty 1

## 2012-08-02 MED ORDER — LACTATED RINGERS IV SOLN
INTRAVENOUS | Status: DC
  Administered 2012-08-02 (×3): via INTRAVENOUS
  Filled 2012-08-02: qty 1000

## 2012-08-02 MED ORDER — ONDANSETRON HCL 4 MG/2ML IJ SOLN
INTRAMUSCULAR | Status: DC | PRN
  Administered 2012-08-02: 4 mg via INTRAVENOUS

## 2012-08-02 MED ORDER — DEXAMETHASONE SODIUM PHOSPHATE 4 MG/ML IJ SOLN
INTRAMUSCULAR | Status: DC | PRN
  Administered 2012-08-02: 8 mg via INTRAVENOUS

## 2012-08-02 MED ORDER — CIPROFLOXACIN HCL 500 MG PO TABS: 500.0000 mg | ORAL_TABLET | Freq: Two times a day (BID) | ORAL | Status: DC

## 2012-08-02 MED ORDER — MORPHINE SULFATE 2 MG/ML IJ SOLN
1.0000 mg | INTRAMUSCULAR | Status: DC | PRN
  Filled 2012-08-02: qty 1

## 2012-08-02 MED ORDER — LACTATED RINGERS IV SOLN
INTRAVENOUS | Status: DC
  Filled 2012-08-02: qty 1000

## 2012-08-02 MED ORDER — LIDOCAINE HCL (CARDIAC) 20 MG/ML IV SOLN
INTRAVENOUS | Status: DC | PRN
  Administered 2012-08-02: 75 mg via INTRAVENOUS

## 2012-08-02 MED ORDER — CIPROFLOXACIN IN D5W 400 MG/200ML IV SOLN
400.0000 mg | INTRAVENOUS | Status: AC
  Administered 2012-08-02: 400 mg via INTRAVENOUS
  Filled 2012-08-02: qty 200

## 2012-08-02 SURGICAL SUPPLY — 25 items
BAG URINE DRAINAGE (UROLOGICAL SUPPLIES) ×2 IMPLANT
BLADE SURG ROTATE 9660 (MISCELLANEOUS) ×2 IMPLANT
CATH FOLEY 2WAY SLVR  5CC 16FR (CATHETERS) ×2
CATH FOLEY 2WAY SLVR 5CC 16FR (CATHETERS) ×2 IMPLANT
CATH ROBINSON RED A/P 20FR (CATHETERS) ×2 IMPLANT
CLOTH BEACON ORANGE TIMEOUT ST (SAFETY) ×2 IMPLANT
COVER MAYO STAND STRL (DRAPES) ×2 IMPLANT
COVER TABLE BACK 60X90 (DRAPES) ×2 IMPLANT
DRSG TEGADERM 4X4.75 (GAUZE/BANDAGES/DRESSINGS) ×2 IMPLANT
DRSG TEGADERM 8X12 (GAUZE/BANDAGES/DRESSINGS) ×2 IMPLANT
GAUZE SPONGE 4X4 12PLY STRL LF (GAUZE/BANDAGES/DRESSINGS) ×2 IMPLANT
GLOVE BIO SURGEON STRL SZ7.5 (GLOVE) IMPLANT
GLOVE BIO SURGEON STRL SZ8 (GLOVE) ×12 IMPLANT
GLOVE ECLIPSE 8.0 STRL XLNG CF (GLOVE) ×6 IMPLANT
GOWN STRL REIN XL XLG (GOWN DISPOSABLE) ×2 IMPLANT
GOWN XL W/COTTON TOWEL STD (GOWNS) ×2 IMPLANT
HOLDER FOLEY CATH W/STRAP (MISCELLANEOUS) ×2 IMPLANT
IV NS IRRIG 3000ML ARTHROMATIC (IV SOLUTION) IMPLANT
IV WATER IRR. 1000ML (IV SOLUTION) ×2 IMPLANT
PACK CYSTOSCOPY (CUSTOM PROCEDURE TRAY) ×2 IMPLANT
SYRINGE 10CC LL (SYRINGE) ×2 IMPLANT
Select seed radiioactive seed ×2 IMPLANT
UNDERPAD 30X30 INCONTINENT (UNDERPADS AND DIAPERS) ×4 IMPLANT
WATER STERILE IRR 3000ML UROMA (IV SOLUTION) IMPLANT
WATER STERILE IRR 500ML POUR (IV SOLUTION) ×2 IMPLANT

## 2012-08-02 NOTE — Op Note (Signed)
PATIENT:  Phillip Nelson  PRE-OPERATIVE DIAGNOSIS:  Adenocarcinoma of the prostate  POST-OPERATIVE DIAGNOSIS:  Same  PROCEDURE:  Procedure(s): 1. I-125 radioactive seed implantation 2. Cystoscopy  SURGEON:  Surgeon(s): Garnett Farm  Radiation oncologist: Dr. Margaretmary Dys  ANESTHESIA:  General  EBL:  Minimal  DRAINS: 16 French Foley catheter  INDICATION: Phillip Nelson is a 78 year old male patient with a biopsy-proven adenocarcinoma of the prostate Gleason score 3+3 = 6 in 5/12 cores bilaterally. He has elected to proceed with radioactive seed implantation for treatment.   Description of procedure: After informed consent the patient was brought to the major OR, placed on the table and administered general anesthesia. He was then moved to the modified lithotomy position with his perineum perpendicular to the floor. His perineum and genitalia were then sterilely prepped. An official timeout was then performed. A 16 French Foley catheter was then placed in the bladder and filled with dilute contrast, a rectal tube was placed in the rectum and the transrectal ultrasound probe was placed in the rectum and affixed to the stand. He was then sterilely draped.  Real time ultrasonography was used along with the seed planning software spot-pro version 3.1-00. This was used to develop the seed plan including the number of needles as well as number of seeds required for complete and adequate coverage. Real-time ultrasonography was then used along with the previously developed plan and the Nucletron device to implant a total of 75 seeds using 20 needles. This proceeded without difficulty or complication.  A Foley catheter was then removed as well as the transrectal ultrasound probe and rectal probe. Flexible cystoscopy was then performed using the 17 French flexible scope which revealed a normal urethra throughout its length down to the sphincter which appeared intact. The prostatic urethra  revealed bilobar hypertrophy but no evidence of obstruction, seeds, spacers or lesions. The bladder was then entered and fully and systematically inspected. The ureteral orifices were noted to be of normal configuration and position. The mucosa revealed no evidence of tumors. There were also no stones identified within the bladder. I noted no seeds or spacers on the floor of the bladder and retroflexion of the scope revealed no seeds protruding from the base of the prostate.  The cystoscope was then removed and a new 16 French Foley catheter was then inserted and the balloon was filled with 10 cc of sterile water. This was connected to closed system drainage and the patient was awakened and taken to recovery room in stable and satisfactory condition. He tolerated procedure well and there were no intraoperative complications.

## 2012-08-02 NOTE — Anesthesia Procedure Notes (Signed)
Procedure Name: LMA Insertion Date/Time: 08/02/2012 7:46 AM Performed by: Fran Lowes Pre-anesthesia Checklist: Patient identified, Emergency Drugs available, Suction available and Patient being monitored Patient Re-evaluated:Patient Re-evaluated prior to inductionOxygen Delivery Method: Circle System Utilized Preoxygenation: Pre-oxygenation with 100% oxygen Intubation Type: IV induction Ventilation: Mask ventilation without difficulty LMA: LMA inserted LMA Size: 5.0 Number of attempts: 1 Airway Equipment and Method: bite block Placement Confirmation: positive ETCO2 Tube secured with: Tape Dental Injury: Teeth and Oropharynx as per pre-operative assessment

## 2012-08-02 NOTE — Transfer of Care (Signed)
Immediate Anesthesia Transfer of Care Note    Immediate Anesthesia Transfer of Care Note  Patient: Phillip Nelson  Procedure(s) Performed: Procedure(s) (LRB): RADIOACTIVE SEED IMPLANT (N/A)  Patient Location: Patient transported to PACU with oxygen via face mask at 4 Liters / Min  Anesthesia Type: General  Level of Consciousness: awake and alert   Airway & Oxygen Therapy: Patient Spontanous Breathing and Patient connected to face mask oxygen  Post-op Assessment: Report given to PACU RN and Post -op Vital signs reviewed and stable  Post vital signs: Reviewed and stable  Dentition: Teeth and oropharynx remain in pre-op condition  Complications: No apparent anesthesia complications

## 2012-08-02 NOTE — Interval H&P Note (Signed)
History and Physical Interval Note:  08/02/2012 7:16 AM  Phillip Nelson  has presented today for surgery, with the diagnosis of PROSTATE CANCER  The various methods of treatment have been discussed with the patient and family. After consideration of risks, benefits and other options for treatment, the patient has consented to  Procedure(s): RADIOACTIVE SEED IMPLANT (N/A) as a surgical intervention .  The patient's history has been reviewed, patient examined, no change in status, stable for surgery.  I have reviewed the patient's chart and labs.  Questions were answered to the patient's satisfaction.     Garnett Farm

## 2012-08-02 NOTE — Anesthesia Postprocedure Evaluation (Signed)
Anesthesia Post Note  Patient: Phillip Nelson  Procedure(s) Performed: Procedure(s) (LRB): RADIOACTIVE SEED IMPLANT (N/A)  Anesthesia type: General  Patient location: PACU  Post pain: Pain level controlled  Post assessment: Post-op Vital signs reviewed  Last Vitals:  Filed Vitals:   08/02/12 1030  BP: 120/68  Pulse: 90  Temp:   Resp: 21    Post vital signs: Reviewed  Level of consciousness: sedated  Complications: No apparent anesthesia complications

## 2012-08-05 ENCOUNTER — Encounter (HOSPITAL_BASED_OUTPATIENT_CLINIC_OR_DEPARTMENT_OTHER): Payer: Self-pay | Admitting: Urology

## 2012-08-05 NOTE — Procedures (Signed)
  Radiation Oncology         [phone] ________________________________  Name: Phillip Nelson MRN: [id number]  Date: 06/06/2012  DOB: March 23, 1935       Prostate Seed Implant  CC:No primary provider on file.  No ref. provider found  DIAGNOSIS: 78 y.o. gentleman with stage T2a adenocarcinoma of the prostate with a Gleason's score of 3+3 and a PSA of 8.26  PROCEDURE: Insertion of radioactive I-125 seeds into the prostate gland.  RADIATION DOSE: 145 Gy, definitive therapy.  TECHNIQUE: MOHSIN CRUM was brought to the operating room with the urologist. He was placed in the dorsolithotomy position. He was catheterized and a rectal tube was inserted. The perineum was shaved, prepped and draped. The ultrasound probe was then introduced into the rectum to see the prostate gland.  TREATMENT DEVICE: A needle grid was attached to the ultrasound probe stand and anchor needles were placed.  COMPLEX ISODOSE CALCULATION: The prostate was imaged in 3D using a sagittal sweep of the prostate probe. These images were transferred to the planning computer. There, the prostate, urethra and rectum were defined on each axial reconstructed image. Then, the software created an optimized plan and a few seed positions were adjusted. Then the accepted plan was uploaded to the seed Selectron afterloading unit.  SPECIAL TREATMENT PROCEDURE/SUPERVISION AND HANDLING: The Nucletron FIRST system was used to place the needles under sagittal guidance. A total of 20 needles were used to deposit 75 seeds in the prostate gland. The individual seed activity was 0.497 mCi for a total implant activity of 37.275 mCi.  COMPLEX SIMULATION: At the end of the procedure, an anterior radiograph of the pelvis was obtained to document seed positioning and count. Cystoscopy was performed to check the urethra and bladder.  MICRODOSIMETRY: At the end of the procedure, the patient was emitting 0.04 mrem/hr at 1 meter. Accordingly, he  was considered safe for hospital discharge.  PLAN: The patient will return to the radiation oncology clinic for post implant CT dosimetry in three weeks.   ________________________________  Artist Pais Kathrynn Running, M.D.

## 2012-08-21 ENCOUNTER — Telehealth: Payer: Self-pay | Admitting: *Deleted

## 2012-08-21 NOTE — Telephone Encounter (Signed)
Called patient to remind of appts. For 08-22-12, lvm for a return call 

## 2012-08-22 ENCOUNTER — Ambulatory Visit
Admission: RE | Admit: 2012-08-22 | Discharge: 2012-08-22 | Disposition: A | Discharge status: Home / Self Care | Payer: Medicare Other | Source: Ambulatory Visit | Attending: Radiation Oncology | Admitting: Radiation Oncology

## 2012-08-22 ENCOUNTER — Encounter: Payer: Self-pay | Admitting: Radiation Oncology

## 2012-08-22 VITALS — BP 146/82 | HR 83 | Temp 97.6°F | Ht 70.0 in | Wt 222.0 lb

## 2012-08-22 DIAGNOSIS — C61 Malignant neoplasm of prostate: Secondary | ICD-10-CM

## 2012-08-22 NOTE — Progress Notes (Signed)
Mr. Kelley in s/p prostate seed implant.  He reports initially burning upon urination and rectal discomfort post implant, but currently he c/o urinary frequency and he sits down to void which as he states, "it gives him time to urinate."  He c/o fatigue today, but played golf on Monday.

## 2012-08-22 NOTE — Progress Notes (Signed)
  Radiation Oncology         [phone] ________________________________  Name: ABUNDIO TEUSCHER MRN: [id number]  Date: 08/22/2012  DOB: 1935-02-14  COMPLEX SIMULATION NOTE  NARRATIVE:  The patient was brought to the CT Simulation planning suite today following prostate seed implantation approximately one month ago.  Identity was confirmed.  All relevant records and images related to the planned course of therapy were reviewed.  Then, the patient was set-up supine.  CT images were obtained.  The CT images were loaded into the planning software.  Then the prostate and rectum were contoured.  Treatment planning then occurred.  The implanted iodine 125 seeds were identified by the physics staff for projection of radiation distribution  I have requested : 3D Simulation  I have requested a DVH of the following structures: Prostate and rectum.    ________________________________  Artist Pais Kathrynn Running, M.D.

## 2012-08-22 NOTE — Progress Notes (Signed)
Radiation Oncology         [phone] ________________________________  Name: Phillip Nelson MRN: [id number]  Date: 08/22/2012  DOB: 24-Feb-1935  Follow-Up Visit Note  CC: No primary provider on file.  Garnett Farm, MD  Diagnosis:   78 y.o. gentleman with stage T2a adenocarcinoma of the prostate with a Gleason's score of 3+3 and a PSA of 8.26  Interval Since Last Radiation:  3  weeks  Narrative:  The patient returns today for routine follow-up.  He is complaining of increased urinary frequency and urinary hesitation symptoms. He filled out a questionnaire regarding urinary function today providing and overall IPSS score of 21 characterizing his symptoms as severe.  His pre-implant score was 6. He denies any bowel symptoms.  ALLERGIES:  is allergic to indocin and penicillins.  Meds: Current Outpatient Prescriptions  Medication Sig Dispense Refill  . allopurinol (ZYLOPRIM) 300 MG tablet Take 300 mg by mouth daily.      . fluticasone (FLONASE) 50 MCG/ACT nasal spray       . HYDROcodone-acetaminophen (NORCO) 10-325 MG per tablet Take 1 tablet by mouth every 6 (six) hours as needed for pain.  30 tablet  0  . meclizine (ANTIVERT) 25 MG tablet Take 25 mg by mouth as needed.      . naproxen sodium (ANAPROX) 220 MG tablet Take 220 mg by mouth as needed.       . Potassium 99 MG TABS Take 4 tablets by mouth daily. OTC      . vitamin B-12 (CYANOCOBALAMIN) 100 MCG tablet Take 50 mcg by mouth daily.        No current facility-administered medications for this encounter.    Physical Findings: The patient is in no acute distress. Patient is alert and oriented.  height is 5\' 10"  (1.778 m) and weight is 222 lb (100.699 kg). His temperature is 97.6 F (36.4 C). His blood pressure is 146/82 and his pulse is 83. .  No significant changes.  Lab Findings: Lab Results  Component Value Date   WBC 12.2* 07/26/2012   HGB 16.2 07/26/2012   HCT 46.4 07/26/2012   MCV 95.3 07/26/2012   PLT 231  07/26/2012    Radiographic Findings:  Patient underwent CT imaging in our clinic for post implant dosimetry. The CT appears to demonstrate an adequate distribution of radioactive seeds throughout the prostate gland. There no seeds in her near the rectum. I suspect the final radiation plan and dosimetry will show appropriate coverage of the prostate gland.   Impression: The patient is recovering from the effects of radiation. His urinary symptoms should gradually improve over the next 4-6 months. We talked about this today. He is encouraged by his improvement already and is otherwise please with his outcome.   Plan: Today, I spent time talking to the patient about his prostate seed implant and resolving urinary symptoms. We also talked about long-term follow-up for prostate cancer following seed implant. He understands that ongoing PSA determinations and digital rectal exams will help perform surveillance to rule out disease recurrence. He understands what to expect with his PSA measures. Patient was also educated today about some of the long-term effects would radiation including the Small risk for rectal bleeding and possibly erectile dysfunction. Talked about some of the general management approaches to these potential complications. However, I did encourage the patient to contact her office or return at any point if he has questions or concerns related to his previous radiation and prostate cancer.  _____________________________________  Sheral Apley Tammi Klippel, M.D.

## 2012-08-28 ENCOUNTER — Encounter: Payer: Self-pay | Admitting: Radiation Oncology

## 2012-08-28 DIAGNOSIS — C61 Malignant neoplasm of prostate: Secondary | ICD-10-CM | POA: Diagnosis not present

## 2012-09-13 NOTE — Progress Notes (Signed)
  Radiation Oncology         [phone] ________________________________  Name: Phillip Nelson MRN: [id number]  Date: 08/28/2012  DOB: 1934-12-04  3-D Planning Note Prostate Brachytherapy  Diagnosis: 78 y.o. gentleman with stage T2a adenocarcinoma of the prostate with a Gleason's score of 3+3 and a PSA of 8.26  Narrative: On a previous date, Phillip Nelson returned following prostate seed implantation for post implant planning. He underwent CT scan complex simulation to delineate the three-dimensional structures of the pelvis and demonstrate the radiation distribution.  Since that time, the seed localization, and 3D planning with dose volume histograms have now been completed.  Results:   Prostate Coverage - The dose of radiation delivered to the 90% or more of the prostate gland (D90) was 114.2% of the prescription dose. This exceeds our goal of greater than 90%. Rectal Sparing - The volume of rectal tissue receiving the prescription dose or higher was 0.31 cc. This falls under our thresholds tolerance of 1.0 cc.  Impression: The prostate seed implant appears to show adequate target coverage and appropriate rectal sparing.  Plan:  The patient will continue to follow with urology for ongoing PSA determinations. I would anticipate a high likelihood for local tumor control with minimal risk for rectal morbidity.  ________________________________  Artist Pais Kathrynn Running, M.D.

## 2012-10-16 ENCOUNTER — Encounter: Payer: Self-pay | Admitting: *Deleted

## 2012-11-27 DIAGNOSIS — C61 Malignant neoplasm of prostate: Secondary | ICD-10-CM | POA: Diagnosis not present

## 2012-12-04 DIAGNOSIS — N402 Nodular prostate without lower urinary tract symptoms: Secondary | ICD-10-CM | POA: Diagnosis not present

## 2012-12-04 DIAGNOSIS — C61 Malignant neoplasm of prostate: Secondary | ICD-10-CM | POA: Diagnosis not present

## 2013-01-02 DIAGNOSIS — Z23 Encounter for immunization: Secondary | ICD-10-CM | POA: Diagnosis not present

## 2013-02-05 DIAGNOSIS — H43399 Other vitreous opacities, unspecified eye: Secondary | ICD-10-CM | POA: Diagnosis not present

## 2013-05-06 DIAGNOSIS — C61 Malignant neoplasm of prostate: Secondary | ICD-10-CM | POA: Diagnosis not present

## 2013-05-06 DIAGNOSIS — R109 Unspecified abdominal pain: Secondary | ICD-10-CM | POA: Diagnosis not present

## 2013-05-19 DIAGNOSIS — M999 Biomechanical lesion, unspecified: Secondary | ICD-10-CM | POA: Diagnosis not present

## 2013-05-19 DIAGNOSIS — M5137 Other intervertebral disc degeneration, lumbosacral region: Secondary | ICD-10-CM | POA: Diagnosis not present

## 2013-05-19 DIAGNOSIS — M5380 Other specified dorsopathies, site unspecified: Secondary | ICD-10-CM | POA: Diagnosis not present

## 2013-05-21 DIAGNOSIS — M999 Biomechanical lesion, unspecified: Secondary | ICD-10-CM | POA: Diagnosis not present

## 2013-05-21 DIAGNOSIS — M5380 Other specified dorsopathies, site unspecified: Secondary | ICD-10-CM | POA: Diagnosis not present

## 2013-05-21 DIAGNOSIS — M5137 Other intervertebral disc degeneration, lumbosacral region: Secondary | ICD-10-CM | POA: Diagnosis not present

## 2013-05-29 DIAGNOSIS — C61 Malignant neoplasm of prostate: Secondary | ICD-10-CM | POA: Diagnosis not present

## 2013-06-04 DIAGNOSIS — C61 Malignant neoplasm of prostate: Secondary | ICD-10-CM | POA: Diagnosis not present

## 2013-06-12 DIAGNOSIS — J209 Acute bronchitis, unspecified: Secondary | ICD-10-CM | POA: Diagnosis not present

## 2013-06-12 DIAGNOSIS — J449 Chronic obstructive pulmonary disease, unspecified: Secondary | ICD-10-CM | POA: Diagnosis not present

## 2013-09-16 DIAGNOSIS — M25569 Pain in unspecified knee: Secondary | ICD-10-CM | POA: Diagnosis not present

## 2013-09-16 DIAGNOSIS — S8000XA Contusion of unspecified knee, initial encounter: Secondary | ICD-10-CM | POA: Diagnosis not present

## 2013-10-30 DIAGNOSIS — M25519 Pain in unspecified shoulder: Secondary | ICD-10-CM | POA: Diagnosis not present

## 2013-10-30 DIAGNOSIS — M19019 Primary osteoarthritis, unspecified shoulder: Secondary | ICD-10-CM | POA: Diagnosis not present

## 2013-11-07 ENCOUNTER — Ambulatory Visit: Payer: Self-pay | Admitting: General Practice

## 2013-11-14 DIAGNOSIS — M79609 Pain in unspecified limb: Secondary | ICD-10-CM | POA: Diagnosis not present

## 2013-11-14 DIAGNOSIS — IMO0002 Reserved for concepts with insufficient information to code with codable children: Secondary | ICD-10-CM | POA: Diagnosis not present

## 2013-12-03 DIAGNOSIS — C61 Malignant neoplasm of prostate: Secondary | ICD-10-CM | POA: Diagnosis not present

## 2013-12-04 DIAGNOSIS — M66329 Spontaneous rupture of flexor tendons, unspecified upper arm: Secondary | ICD-10-CM | POA: Diagnosis not present

## 2013-12-10 DIAGNOSIS — C61 Malignant neoplasm of prostate: Secondary | ICD-10-CM | POA: Diagnosis not present

## 2013-12-10 DIAGNOSIS — R300 Dysuria: Secondary | ICD-10-CM | POA: Diagnosis not present

## 2014-01-08 DIAGNOSIS — Z23 Encounter for immunization: Secondary | ICD-10-CM | POA: Diagnosis not present

## 2014-07-20 DIAGNOSIS — G4733 Obstructive sleep apnea (adult) (pediatric): Secondary | ICD-10-CM | POA: Diagnosis not present

## 2014-07-20 DIAGNOSIS — I25119 Atherosclerotic heart disease of native coronary artery with unspecified angina pectoris: Secondary | ICD-10-CM | POA: Diagnosis not present

## 2014-07-20 DIAGNOSIS — I10 Essential (primary) hypertension: Secondary | ICD-10-CM | POA: Insufficient documentation

## 2014-07-20 DIAGNOSIS — E785 Hyperlipidemia, unspecified: Secondary | ICD-10-CM | POA: Insufficient documentation

## 2014-07-20 DIAGNOSIS — E782 Mixed hyperlipidemia: Secondary | ICD-10-CM | POA: Insufficient documentation

## 2014-07-20 DIAGNOSIS — I209 Angina pectoris, unspecified: Secondary | ICD-10-CM | POA: Diagnosis not present

## 2014-07-20 DIAGNOSIS — I2510 Atherosclerotic heart disease of native coronary artery without angina pectoris: Secondary | ICD-10-CM | POA: Insufficient documentation

## 2014-08-04 DIAGNOSIS — I209 Angina pectoris, unspecified: Secondary | ICD-10-CM | POA: Diagnosis not present

## 2014-08-04 DIAGNOSIS — I10 Essential (primary) hypertension: Secondary | ICD-10-CM | POA: Diagnosis not present

## 2014-08-04 DIAGNOSIS — I2510 Atherosclerotic heart disease of native coronary artery without angina pectoris: Secondary | ICD-10-CM | POA: Diagnosis not present

## 2014-08-04 DIAGNOSIS — I25119 Atherosclerotic heart disease of native coronary artery with unspecified angina pectoris: Secondary | ICD-10-CM | POA: Diagnosis not present

## 2014-08-04 DIAGNOSIS — G4733 Obstructive sleep apnea (adult) (pediatric): Secondary | ICD-10-CM | POA: Diagnosis not present

## 2014-08-04 DIAGNOSIS — E782 Mixed hyperlipidemia: Secondary | ICD-10-CM | POA: Diagnosis not present

## 2014-08-06 DIAGNOSIS — J209 Acute bronchitis, unspecified: Secondary | ICD-10-CM | POA: Diagnosis not present

## 2014-08-10 DIAGNOSIS — M109 Gout, unspecified: Secondary | ICD-10-CM | POA: Diagnosis not present

## 2014-08-10 DIAGNOSIS — C801 Malignant (primary) neoplasm, unspecified: Secondary | ICD-10-CM | POA: Diagnosis not present

## 2014-08-10 DIAGNOSIS — Z1389 Encounter for screening for other disorder: Secondary | ICD-10-CM | POA: Diagnosis not present

## 2014-08-10 DIAGNOSIS — M1990 Unspecified osteoarthritis, unspecified site: Secondary | ICD-10-CM | POA: Diagnosis not present

## 2014-08-10 DIAGNOSIS — J209 Acute bronchitis, unspecified: Secondary | ICD-10-CM | POA: Diagnosis not present

## 2014-08-10 DIAGNOSIS — Z7189 Other specified counseling: Secondary | ICD-10-CM | POA: Diagnosis not present

## 2014-08-10 DIAGNOSIS — I252 Old myocardial infarction: Secondary | ICD-10-CM | POA: Diagnosis not present

## 2014-12-02 DIAGNOSIS — C61 Malignant neoplasm of prostate: Secondary | ICD-10-CM | POA: Diagnosis not present

## 2014-12-09 DIAGNOSIS — C61 Malignant neoplasm of prostate: Secondary | ICD-10-CM | POA: Diagnosis not present

## 2014-12-29 DIAGNOSIS — R109 Unspecified abdominal pain: Secondary | ICD-10-CM | POA: Diagnosis not present

## 2014-12-29 DIAGNOSIS — N281 Cyst of kidney, acquired: Secondary | ICD-10-CM | POA: Diagnosis not present

## 2015-01-08 DIAGNOSIS — T63481A Toxic effect of venom of other arthropod, accidental (unintentional), initial encounter: Secondary | ICD-10-CM | POA: Diagnosis not present

## 2015-01-08 DIAGNOSIS — W57XXXA Bitten or stung by nonvenomous insect and other nonvenomous arthropods, initial encounter: Secondary | ICD-10-CM | POA: Diagnosis not present

## 2015-01-08 DIAGNOSIS — S70369A Insect bite (nonvenomous), unspecified thigh, initial encounter: Secondary | ICD-10-CM | POA: Diagnosis not present

## 2015-02-02 DIAGNOSIS — Z23 Encounter for immunization: Secondary | ICD-10-CM | POA: Diagnosis not present

## 2015-03-02 ENCOUNTER — Encounter: Payer: Self-pay | Admitting: Family Medicine

## 2015-03-02 ENCOUNTER — Ambulatory Visit (INDEPENDENT_AMBULATORY_CARE_PROVIDER_SITE_OTHER): Payer: Medicare Other | Admitting: Family Medicine

## 2015-03-02 VITALS — BP 132/78 | HR 83 | Resp 16 | Ht 70.0 in | Wt 227.0 lb

## 2015-03-02 DIAGNOSIS — K635 Polyp of colon: Secondary | ICD-10-CM | POA: Diagnosis not present

## 2015-03-02 DIAGNOSIS — K602 Anal fissure, unspecified: Secondary | ICD-10-CM | POA: Diagnosis not present

## 2015-03-02 DIAGNOSIS — K625 Hemorrhage of anus and rectum: Secondary | ICD-10-CM

## 2015-03-02 DIAGNOSIS — R42 Dizziness and giddiness: Secondary | ICD-10-CM | POA: Diagnosis not present

## 2015-03-02 DIAGNOSIS — Z8719 Personal history of other diseases of the digestive system: Secondary | ICD-10-CM

## 2015-03-02 HISTORY — DX: Personal history of other diseases of the digestive system: Z87.19

## 2015-03-02 MED ORDER — MECLIZINE HCL 25 MG PO TABS: 25.0000 mg | ORAL_TABLET | Freq: Three times a day (TID) | ORAL | Status: DC

## 2015-03-02 MED ORDER — HYDROCORTISONE ACETATE 25 MG RE SUPP: 25.0000 mg | Freq: Two times a day (BID) | RECTAL | Status: DC

## 2015-03-02 NOTE — Progress Notes (Signed)
Name: Phillip Nelson   MRN: [id number]    DOB: 30-Mar-1934   Date:03/02/2015       Progress Note  Subjective  Chief Complaint  Chief Complaint  Patient presents with  . Rectal Bleeding    HPI  Here c/o dark rectal bleeding with a bowel movement x 2 over past 3 dayhs.  None with most recent BM.  Had colonoscopy 2013, found few polyps.  Dr. Jamal Collin.  Both BMs with blood were with hard stool. No problem-specific assessment & plan notes found for this encounter.   Past Medical History  Diagnosis Date  . Hyperlipidemia   . ED (erectile dysfunction)   . Gout, arthritis   . Aural vertigo   . Coronary artery disease [place]  . Prostate cancer ([place]) 05/08/12    Adenocarcinoma,gleason=3+3=6,PSA=8.26,vol=57cc  . History of skin cancer   . OSA (obstructive sleep apnea)     CPAP NON-COMPLIANT    Social History  Substance Use Topics  . Smoking status: Former Smoker -- 3.00 packs/day for 31 years    Quit date: 08/01/1990  . Smokeless tobacco: Never Used  . Alcohol Use: Yes     Comment: rarely  6 beers year,     Current outpatient prescriptions:  .  allopurinol (ZYLOPRIM) 300 MG tablet, One and half tablets daily. Do not stop allopurinol if you have a gout flare., Disp: , Rfl:  .  aspirin EC 81 MG tablet, Take by mouth., Disp: , Rfl:  .  meclizine (ANTIVERT) 25 MG tablet, Take 25 mg by mouth as needed., Disp: , Rfl:  .  naproxen sodium (ANAPROX) 220 MG tablet, Take 220 mg by mouth as needed. , Disp: , Rfl:  .  Potassium 99 MG TABS, Take by mouth., Disp: , Rfl:  .  vitamin B-12 (CYANOCOBALAMIN) 100 MCG tablet, Take 50 mcg by mouth daily. , Disp: , Rfl:   Allergies  Allergen Reactions  . Indocin [Indomethacin]     tremors  . Penicillins Itching and Rash    Review of Systems  Constitutional: Negative for fever, chills, weight loss and malaise/fatigue.  HENT: Negative for hearing loss.   Eyes: Negative for blurred vision and double vision.  Respiratory: Negative for  cough, shortness of breath and wheezing.   Cardiovascular: Negative for chest pain, palpitations and leg swelling.  Gastrointestinal: Positive for constipation. Negative for heartburn, nausea, vomiting and abdominal pain. Blood in stool: darker red.  Genitourinary: Negative for dysuria, urgency and frequency.  Skin: Negative for rash.  Neurological: Negative for weakness and headaches.      Objective  Filed Vitals:   03/02/15 0850  BP: 132/78  Pulse: 83  Resp: 16  Height: 5\' 10"  (1.778 m)  Weight: 227 lb (102.967 kg)     Physical Exam  Constitutional: He is oriented to person, place, and time and well-developed, well-nourished, and in no distress. No distress.  HENT:  Head: Normocephalic and atraumatic.  Cardiovascular: Normal rate, normal heart sounds and intact distal pulses.  Exam reveals no gallop and no friction rub.   No murmur heard. Pulmonary/Chest: Effort normal. No respiratory distress. He has no wheezes. He has no rales.  Abdominal: Soft. Bowel sounds are normal. He exhibits no distension and no mass. There is no tenderness.  Genitourinary: Guaiac negative stool.  Anus with small anal fissure at 12:00 without active bleeding. No rectal masses.  Neurological: He is alert and oriented to person, place, and time.  Vitals reviewed.     No results  found for this or any previous visit (from the past 2160 hour(s)).   Assessment & Plan  1. Colon polyps   2. Rectal bleeding  - Ambulatory referral to General Surgery  3. Anal fissure  - hydrocortisone (ANUSOL-HC) 25 MG suppository; Place 1 suppository (25 mg total) rectally 2 (two) times daily.  Dispense: 12 suppository; Refill: 0  4. Vertigo  - meclizine (ANTIVERT) 25 MG tablet; Take 1 tablet (25 mg total) by mouth 3 (three) times daily.  Dispense: 30 tablet; Refill: 6

## 2015-03-02 NOTE — Patient Instructions (Signed)
Use TUCKs pads to clean after a bowel movement.

## 2015-03-11 ENCOUNTER — Ambulatory Visit (INDEPENDENT_AMBULATORY_CARE_PROVIDER_SITE_OTHER): Payer: Medicare Other | Admitting: General Surgery

## 2015-03-11 ENCOUNTER — Encounter: Payer: Self-pay | Admitting: General Surgery

## 2015-03-11 VITALS — BP 128/72 | HR 74 | Resp 14 | Ht 70.0 in | Wt 227.0 lb

## 2015-03-11 DIAGNOSIS — K625 Hemorrhage of anus and rectum: Secondary | ICD-10-CM

## 2015-03-11 DIAGNOSIS — Z86010 Personal history of colonic polyps: Secondary | ICD-10-CM

## 2015-03-11 DIAGNOSIS — K5732 Diverticulitis of large intestine without perforation or abscess without bleeding: Secondary | ICD-10-CM | POA: Diagnosis not present

## 2015-03-11 LAB — POC HEMOCCULT BLD/STL (OFFICE/1-CARD/DIAGNOSTIC): Fecal Occult Blood, POC: NEGATIVE

## 2015-03-11 NOTE — Patient Instructions (Signed)
Patient  To return as needed.

## 2015-03-11 NOTE — Progress Notes (Signed)
Patient ID: Phillip Nelson, male   DOB: [birth year], 80 y.o.   MRN: [id number]  Chief Complaint  Patient presents with  . Rectal Bleeding    HPI Phillip Nelson is a 80 y.o. male here today for a evaluation of rectal bleeding. He states he noticed it 12 days ago. The color was dark and only happened one time. No abdominal pain.   Last colonoscopy was done in 2013. He has diverticulosis, history of polyps.. I have reviewed the history of present illness with the patient.                                                                        HPI  Past Medical History  Diagnosis Date  . Hyperlipidemia   . ED (erectile dysfunction)   . Gout, arthritis   . Aural vertigo   . Coronary artery disease [place]  . Prostate cancer ([place]) 05/08/12    Adenocarcinoma,gleason=3+3=6,PSA=8.26,vol=57cc  . History of skin cancer   . OSA (obstructive sleep apnea)     CPAP NON-COMPLIANT    Past Surgical History  Procedure Laterality Date  . Carpectomy Right     wrist  . Knee arthroscopy Bilateral   . Cataract extraction w/ intraocular lens  implant, bilateral    . Foot surgery Bilateral     GOUT  . Total knee arthroplasty Bilateral LEFT 2012/   RIGHT 2006  . Left elbow surgery  AGE 3  &  2007  . Cardiovascular stress test  09-07-2011  dr Saralyn Pilar    normal perfusion/ ef 57%/ no ischemia  . Transthoracic echocardiogram  11-24-2010    normal lvf &  lvh/ mild mr, tr, and ai  . Coronary artery bypass graft  JULY 2005  ([facility])    LIMA TO THE LAD  . Radioactive seed implant N/A 08/02/2012    Procedure: RADIOACTIVE SEED IMPLANT;  Surgeon: Claybon Jabs, MD;  Location: [facility];  Service: Urology;  Laterality: N/A;  . Appendectomy      Life was saved with emerg. surgery.     Family History  Problem Relation Age of Onset  . Alzheimer's disease Father   . Cancer Sister 72    treated surgically    Social History Social History  Substance Use Topics  . Smoking  status: Former Smoker -- 3.00 packs/day for 31 years    Quit date: 08/01/1990  . Smokeless tobacco: Never Used  . Alcohol Use: Yes     Comment: rarely  6 beers year,    Allergies  Allergen Reactions  . Indocin [Indomethacin]     tremors  . Penicillins Itching and Rash    Current Outpatient Prescriptions  Medication Sig Dispense Refill  . allopurinol (ZYLOPRIM) 300 MG tablet One and half tablets daily. Do not stop allopurinol if you have a gout flare.    Marland Kitchen aspirin EC 81 MG tablet Take by mouth.    . hydrocortisone (ANUSOL-HC) 25 MG suppository Place 1 suppository (25 mg total) rectally 2 (two) times daily. 12 suppository 0  . meclizine (ANTIVERT) 25 MG tablet Take 1 tablet (25 mg total) by mouth 3 (three) times daily. 30 tablet 6  . naproxen sodium (ANAPROX) 220 MG tablet Take  220 mg by mouth as needed.     . Potassium 99 MG TABS Take by mouth.    . vitamin B-12 (CYANOCOBALAMIN) 100 MCG tablet Take 50 mcg by mouth daily.      No current facility-administered medications for this visit.    Review of Systems Review of Systems  Constitutional: Negative.   Respiratory: Negative.   Cardiovascular: Negative.     Blood pressure 128/72, pulse 74, resp. rate 14, height 5\' 10"  (1.778 m), weight 227 lb (102.967 kg).  Physical Exam Physical Exam  Constitutional: He is oriented to person, place, and time. He appears well-developed and well-nourished.  Eyes: Conjunctivae are normal. No scleral icterus.  Neck: Neck supple.  Cardiovascular: Normal rate, regular rhythm and normal heart sounds.   Pulmonary/Chest: Effort normal and breath sounds normal.  Abdominal: Soft. Bowel sounds are normal. There is no hepatomegaly. There is no tenderness. No hernia.  Genitourinary: Rectal exam shows no external hemorrhoid, no internal hemorrhoid, no fissure, no mass, no tenderness and anal tone normal. Guaiac negative stool.  Lymphadenopathy:    He has no cervical adenopathy.  Neurological: He is  alert and oriented to person, place, and time.  Skin: Skin is warm and dry.    Data Reviewed Notes reviewed  Assessment    One episode of darkish stool, not sure if it was blood related. Exam is stable and stool is heme neg. Pt reassured. Colonoscopy due in 2018    Plan    Patient to return as scheduled or as needed     PCP:  Philbert Riser, Nikki Dom  This information has been scribed by Gaspar Cola CMA.  SANKAR,SEEPLAPUTHUR G 03/11/2015, 9:58 AM

## 2015-03-22 IMAGING — CR DG CHEST 2V
2 series · 2 of 2 positions shown · non-contrast
Comparison: 07/05/2006.

CLINICAL DATA: Preop.

CHEST - 2 VIEW

[w chest pa]
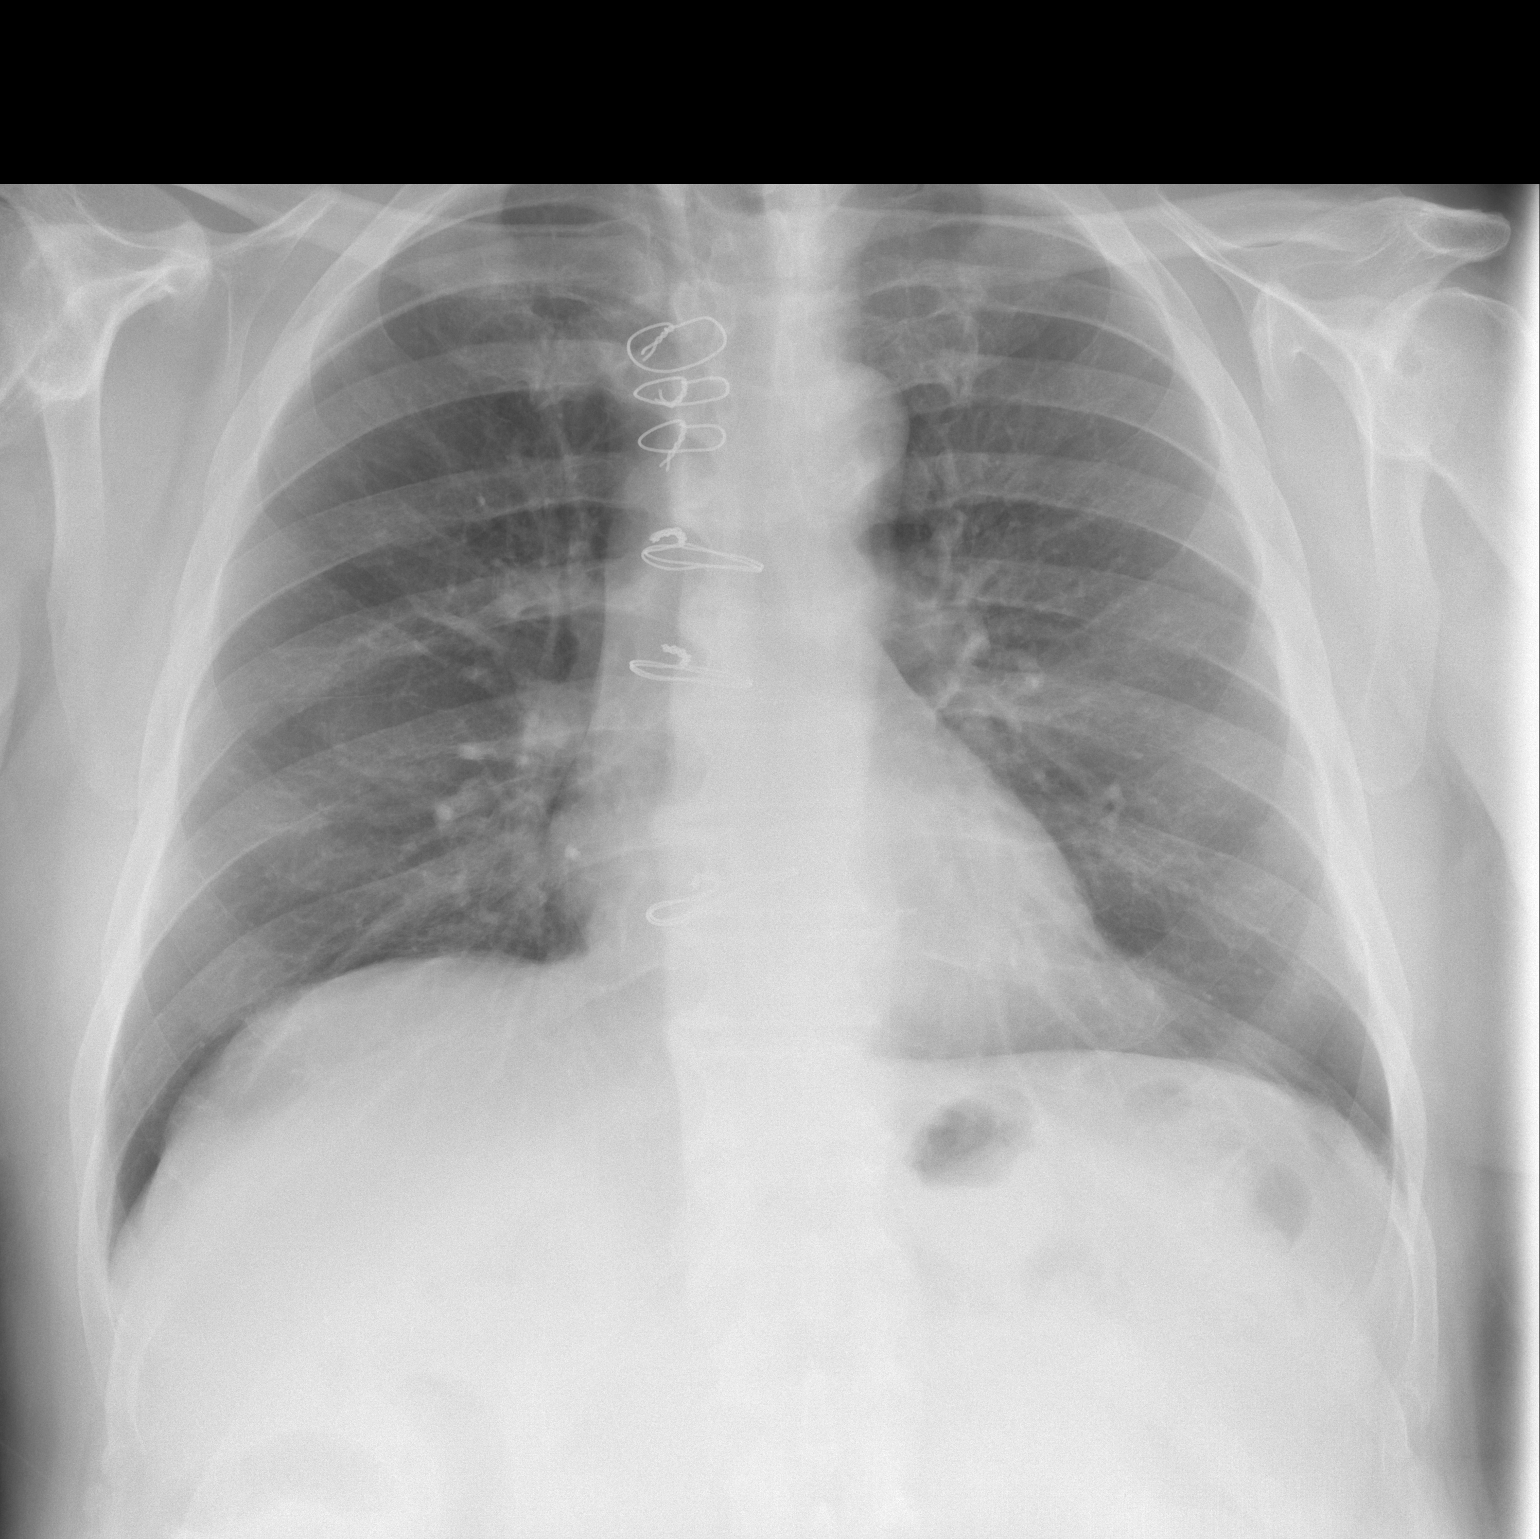

[w chest lat]
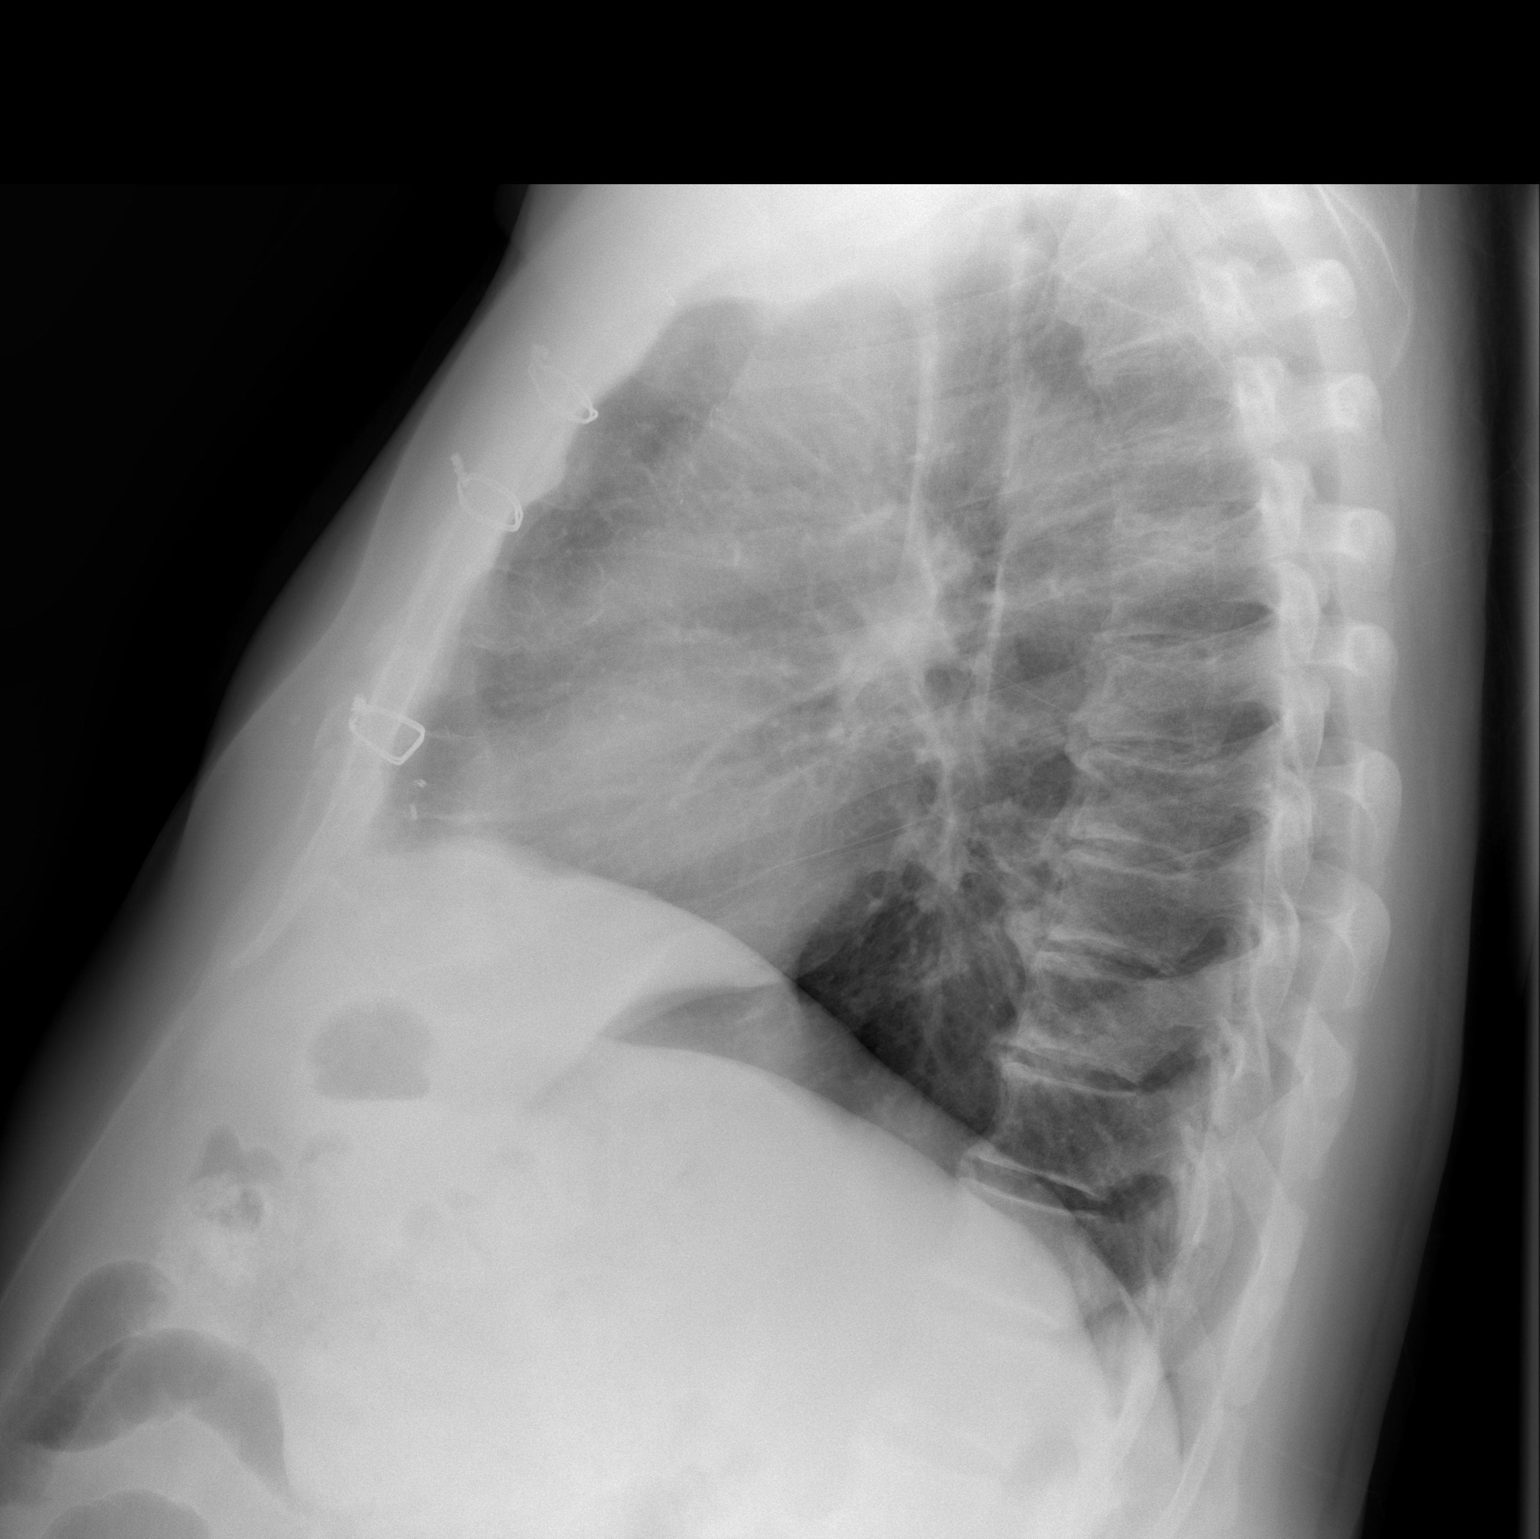

[2 of 2 positions shown; findings below may reference images not displayed]

FINDINGS: Trachea is midline.  Heart size normal.  Lungs are clear.
No pleural fluid.
IMPRESSION: No acute findings.

## 2015-04-01 ENCOUNTER — Encounter: Payer: Self-pay | Admitting: General Surgery

## 2015-04-29 ENCOUNTER — Ambulatory Visit (INDEPENDENT_AMBULATORY_CARE_PROVIDER_SITE_OTHER): Payer: Medicare Other | Admitting: Family Medicine

## 2015-04-29 ENCOUNTER — Encounter: Payer: Self-pay | Admitting: Family Medicine

## 2015-04-29 VITALS — BP 137/84 | HR 86 | Resp 16 | Ht 70.0 in | Wt 230.4 lb

## 2015-04-29 DIAGNOSIS — G4730 Sleep apnea, unspecified: Secondary | ICD-10-CM | POA: Insufficient documentation

## 2015-04-29 NOTE — Progress Notes (Signed)
Name: Phillip Nelson   MRN: [id number]    DOB: 17-Feb-1935   Date:04/29/2015       Progress Note  Subjective  Chief Complaint  Chief Complaint  Patient presents with  . Sleep Apnea    needs CPAP-Sleep [facility] many years [facility].    HPI  Here to get new Rx for CPAP machine.  Dx of sleep apnea.   Had original sleep study >10 yr ago.  Got machine at [place].  Not going to [place] anymore.  He is not aware of settings.  He has not been using his machine regularly recently. OBTW- getting over cough and cold last week.  Getting better. No problem-specific assessment & plan notes found for this encounter.   Past Medical History  Diagnosis Date  . Hyperlipidemia   . ED (erectile dysfunction)   . Gout, arthritis   . Aural vertigo   . Coronary artery disease [place]  . Prostate cancer ([place]) 05/08/12    Adenocarcinoma,gleason=3+3=6,PSA=8.26,vol=57cc  . History of skin cancer   . OSA (obstructive sleep apnea)     CPAP NON-COMPLIANT    Past Surgical History  Procedure Laterality Date  . Carpectomy Right     wrist  . Knee arthroscopy Bilateral   . Cataract extraction w/ intraocular lens  implant, bilateral    . Foot surgery Bilateral     GOUT  . Total knee arthroplasty Bilateral LEFT 2012/   RIGHT 2006  . Left elbow surgery  AGE 41  &  2007  . Cardiovascular stress test  09-07-2011  dr Saralyn Pilar    normal perfusion/ ef 57%/ no ischemia  . Transthoracic echocardiogram  11-24-2010    normal lvf &  lvh/ mild mr, tr, and ai  . Coronary artery bypass graft  JULY 2005  ([facility])    LIMA TO THE LAD  . Radioactive seed implant N/A 08/02/2012    Procedure: RADIOACTIVE SEED IMPLANT;  Surgeon: Claybon Jabs, MD;  Location: [facility];  Service: Urology;  Laterality: N/A;  . Appendectomy      Life was saved with emerg. surgery.     Family History  Problem Relation Age of Onset  . Alzheimer's disease Father   . Cancer Sister 55    treated surgically    Social History    Social History  . Marital Status: Divorced    Spouse Name: N/A  . Number of Children: N/A  . Years of Education: N/A   Occupational History  . Retired    Social History Main Topics  . Smoking status: Former Smoker -- 3.00 packs/day for 31 years    Quit date: 08/01/1990  . Smokeless tobacco: Never Used  . Alcohol Use: Yes     Comment: rarely  6 beers year,  . Drug Use: No     Comment: quit 14moking 25 years ago  . Sexual Activity: Not on file   Other Topics Concern  . Not on file   Social History Narrative     Current outpatient prescriptions:  .  allopurinol (ZYLOPRIM) 300 MG tablet, One and half tablets daily. Do not stop allopurinol if you have a gout flare., Disp: , Rfl:  .  aspirin EC 81 MG tablet, Take by mouth., Disp: , Rfl:  .  chlorhexidine (PERIDEX) 0.12 % solution, , Disp: , Rfl:  .  hydrocortisone (ANUSOL-HC) 25 MG suppository, Place 1 suppository (25 mg total) rectally 2 (two) times daily., Disp: 12 suppository, Rfl: 0 .  meclizine (  ANTIVERT) 25 MG tablet, Take 1 tablet (25 mg total) by mouth 3 (three) times daily., Disp: 30 tablet, Rfl: 6 .  naproxen sodium (ANAPROX) 220 MG tablet, Take 220 mg by mouth as needed. , Disp: , Rfl:  .  Potassium 99 MG TABS, Take by mouth., Disp: , Rfl:  .  vitamin B-12 (CYANOCOBALAMIN) 100 MCG tablet, Take 50 mcg by mouth daily. , Disp: , Rfl:   Allergies  Allergen Reactions  . Indocin [Indomethacin] Other (See Comments)    Shakes. tremors  . Penicillins Itching and Rash     Review of Systems  Constitutional: Negative for fever, chills, weight loss and malaise/fatigue.  HENT: Negative for hearing loss.   Eyes: Negative for blurred vision and double vision.  Respiratory: Positive for cough (very milld) and sputum production (clear, mild). Negative for shortness of breath and wheezing.   Cardiovascular: Negative for chest pain, palpitations and leg swelling.  Gastrointestinal: Negative for heartburn, abdominal pain and  blood in stool.  Genitourinary: Negative for dysuria, urgency and frequency.  Musculoskeletal: Negative for myalgias.  Skin: Negative for rash.  Neurological: Negative for dizziness, tremors, weakness and headaches.      Objective  Filed Vitals:   04/29/15 1310  BP: 137/84  Pulse: 86  Resp: 16  Height: 5\' 10"  (1.778 m)  Weight: 230 lb 6.4 oz (104.509 kg)    Physical Exam  Constitutional: He is oriented to person, place, and time and well-developed, well-nourished, and in no distress. No distress.  HENT:  Head: Normocephalic and atraumatic.  Neck: Normal range of motion. Neck supple. Carotid bruit is not present. No thyromegaly present.  Cardiovascular: Normal rate, regular rhythm and normal heart sounds.  Exam reveals no gallop and no friction rub.   No murmur heard. Pulmonary/Chest: Effort normal and breath sounds normal. No respiratory distress. He has no wheezes. He has no rales.  Musculoskeletal: He exhibits no edema.  Lymphadenopathy:    He has no cervical adenopathy.  Neurological: He is alert and oriented to person, place, and time.  Vitals reviewed.      Recent Results (from the past 2160 hour(s))  POC Hemoccult Bld/Stl (1-Cd Office Dx)     Status: None   Collection Time: 03/11/15  9:40 AM  Result Value Ref Range   Card #1 Date     Fecal Occult Blood, POC Negative Negative     Assessment & Plan  Problem List Items Addressed This Visit      Other   Sleep apnea - Primary      Meds ordered this encounter  Medications  . chlorhexidine (PERIDEX) 0.12 % solution    Sig:    1. Sleep apnea -Refer to [facility] for evaluation \\for  new CPAP machine

## 2015-05-10 DIAGNOSIS — L821 Other seborrheic keratosis: Secondary | ICD-10-CM | POA: Diagnosis not present

## 2015-05-10 DIAGNOSIS — L578 Other skin changes due to chronic exposure to nonionizing radiation: Secondary | ICD-10-CM | POA: Diagnosis not present

## 2015-05-10 DIAGNOSIS — B372 Candidiasis of skin and nail: Secondary | ICD-10-CM | POA: Diagnosis not present

## 2015-05-10 DIAGNOSIS — L570 Actinic keratosis: Secondary | ICD-10-CM | POA: Diagnosis not present

## 2015-05-31 DIAGNOSIS — G4733 Obstructive sleep apnea (adult) (pediatric): Secondary | ICD-10-CM | POA: Diagnosis not present

## 2015-06-08 ENCOUNTER — Encounter: Payer: Self-pay | Admitting: General Surgery

## 2015-06-09 ENCOUNTER — Encounter: Payer: Self-pay | Admitting: General Surgery

## 2015-07-07 DIAGNOSIS — L918 Other hypertrophic disorders of the skin: Secondary | ICD-10-CM | POA: Diagnosis not present

## 2015-07-07 DIAGNOSIS — Z1283 Encounter for screening for malignant neoplasm of skin: Secondary | ICD-10-CM | POA: Diagnosis not present

## 2015-07-07 DIAGNOSIS — L570 Actinic keratosis: Secondary | ICD-10-CM | POA: Diagnosis not present

## 2015-07-07 DIAGNOSIS — L820 Inflamed seborrheic keratosis: Secondary | ICD-10-CM | POA: Diagnosis not present

## 2015-07-07 DIAGNOSIS — D1800 Hemangioma unspecified site: Secondary | ICD-10-CM | POA: Diagnosis not present

## 2015-07-07 DIAGNOSIS — L821 Other seborrheic keratosis: Secondary | ICD-10-CM | POA: Diagnosis not present

## 2015-07-07 DIAGNOSIS — L578 Other skin changes due to chronic exposure to nonionizing radiation: Secondary | ICD-10-CM | POA: Diagnosis not present

## 2015-07-07 DIAGNOSIS — L304 Erythema intertrigo: Secondary | ICD-10-CM | POA: Diagnosis not present

## 2015-08-05 ENCOUNTER — Encounter: Payer: Self-pay | Admitting: Family Medicine

## 2015-08-05 ENCOUNTER — Ambulatory Visit (INDEPENDENT_AMBULATORY_CARE_PROVIDER_SITE_OTHER): Payer: Medicare Other | Admitting: Family Medicine

## 2015-08-05 VITALS — BP 119/70 | HR 70 | Temp 98.0°F | Resp 16 | Ht 71.0 in | Wt 227.0 lb

## 2015-08-05 DIAGNOSIS — G4733 Obstructive sleep apnea (adult) (pediatric): Secondary | ICD-10-CM

## 2015-08-05 NOTE — Patient Instructions (Signed)
Keep up your plans to ease into CPAP. We will ask [facility] if you can try a nose mask.

## 2015-08-05 NOTE — Assessment & Plan Note (Signed)
Doing well on home CPAP. Discussed plan to titrate use at night with goal in wear for 7-8 hours. Will discuss getting a nose only mask with [employer] to help with patient comfort.  Recheck PRN.

## 2015-08-05 NOTE — Progress Notes (Signed)
Subjective:    Patient ID: Phillip Nelson, male    DOB: April 19, 1934, 81 y.o.   MRN: [id number]  HPI: Phillip Nelson is a 81 y.o. male presenting on 08/05/2015 for cpap   HPI  Pt presents for CPAP follow-up. Started on CPAP about 1 mos ago. Can't tolerate at night. Wearing 4 hours during the day. Sleep study found severe sleep apnea. Trying to get used to going to sleep with it. Feels better with CPAP on. Was told to wear the full mask due to mouth breathing by [facility]- but he is clautrophobic.   Past Medical History  Diagnosis Date  . Hyperlipidemia   . ED (erectile dysfunction)   . Gout, arthritis   . Aural vertigo   . Coronary artery disease [place]  . Prostate cancer ([place]) 05/08/12    Adenocarcinoma,gleason=3+3=6,PSA=8.26,vol=57cc  . History of skin cancer   . OSA (obstructive sleep apnea)     CPAP NON-COMPLIANT    Current Outpatient Prescriptions on File Prior to Visit  Medication Sig  . allopurinol (ZYLOPRIM) 300 MG tablet One and half tablets daily. Do not stop allopurinol if you have a gout flare.  Marland Kitchen aspirin EC 81 MG tablet Take by mouth.  . chlorhexidine (PERIDEX) 0.12 % solution   . meclizine (ANTIVERT) 25 MG tablet Take 1 tablet (25 mg total) by mouth 3 (three) times daily. (Patient taking differently: Take 25 mg by mouth 3 (three) times daily as needed. )  . naproxen sodium (ANAPROX) 220 MG tablet Take 220 mg by mouth as needed.   . Potassium 99 MG TABS Take by mouth.  . vitamin B-12 (CYANOCOBALAMIN) 100 MCG tablet Take 50 mcg by mouth daily.   . hydrocortisone (ANUSOL-HC) 25 MG suppository Place 1 suppository (25 mg total) rectally 2 (two) times daily. (Patient not taking: Reported on 08/05/2015)   No current facility-administered medications on file prior to visit.    Review of Systems  Constitutional: Negative for fever and chills.  HENT: Negative.   Respiratory: Negative for chest tightness, shortness of breath and wheezing.   Cardiovascular:  Negative for chest pain, palpitations and leg swelling.  Gastrointestinal: Negative for nausea, vomiting and abdominal pain.  Endocrine: Negative.   Genitourinary: Negative for dysuria, urgency, discharge, penile pain and testicular pain.  Musculoskeletal: Negative for back pain, joint swelling and arthralgias.  Skin: Negative.   Neurological: Negative for dizziness, weakness, numbness and headaches.  Psychiatric/Behavioral: Negative for sleep disturbance and dysphoric mood.   Per HPI unless specifically indicated above     Objective:    BP 119/70 mmHg  Pulse 70  Temp(Src) 98 F (36.7 C) (Oral)  Resp 16  Ht 5\' 11"  (1.803 m)  Wt 227 lb (102.967 kg)  BMI 31.67 kg/m2  Wt Readings from Last 3 Encounters:  08/05/15 227 lb (102.967 kg)  04/29/15 230 lb 6.4 oz (104.509 kg)  03/11/15 227 lb (102.967 kg)    Physical Exam  Constitutional: He is oriented to person, place, and time. He appears well-developed and well-nourished. No distress.  HENT:  Head: Normocephalic and atraumatic.  Neck: Neck supple. No thyromegaly present.  Cardiovascular: Normal rate, regular rhythm and normal heart sounds.  Exam reveals no gallop and no friction rub.   No murmur heard. Pulmonary/Chest: Effort normal and breath sounds normal. He has no wheezes.  Abdominal: Soft. Bowel sounds are normal. He exhibits no distension. There is no tenderness. There is no rebound.  Musculoskeletal: Normal range of motion. He exhibits no edema  or tenderness.  Neurological: He is alert and oriented to person, place, and time. He has normal reflexes.  Skin: Skin is warm and dry. No rash noted. No erythema.  Psychiatric: He has a normal mood and affect. His behavior is normal. Thought content normal.   Results for orders placed or performed in visit on 03/11/15  POC Hemoccult Bld/Stl (1-Cd Office Dx)  Result Value Ref Range   Card #1 Date     Fecal Occult Blood, POC Negative Negative      Assessment & Plan:   Problem  List Items Addressed This Visit      Respiratory   OSA (obstructive sleep apnea) - Primary    Doing well on home CPAP. Discussed plan to titrate use at night with goal in wear for 7-8 hours. Will discuss getting a nose only mask with [facility] to help with patient comfort.  Recheck PRN.          Meds ordered this encounter  Medications  . ketoconazole (NIZORAL) 2 % cream    Sig: Apply 1 application topically as needed.      Follow up plan: Return if symptoms worsen or fail to improve.

## 2015-09-07 DIAGNOSIS — L570 Actinic keratosis: Secondary | ICD-10-CM | POA: Diagnosis not present

## 2015-09-07 DIAGNOSIS — L578 Other skin changes due to chronic exposure to nonionizing radiation: Secondary | ICD-10-CM | POA: Diagnosis not present

## 2015-09-07 DIAGNOSIS — L249 Irritant contact dermatitis, unspecified cause: Secondary | ICD-10-CM | POA: Diagnosis not present

## 2015-09-07 DIAGNOSIS — D1800 Hemangioma unspecified site: Secondary | ICD-10-CM | POA: Diagnosis not present

## 2015-09-07 DIAGNOSIS — L820 Inflamed seborrheic keratosis: Secondary | ICD-10-CM | POA: Diagnosis not present

## 2015-09-07 DIAGNOSIS — M10079 Idiopathic gout, unspecified ankle and foot: Secondary | ICD-10-CM | POA: Diagnosis not present

## 2015-09-07 DIAGNOSIS — L821 Other seborrheic keratosis: Secondary | ICD-10-CM | POA: Diagnosis not present

## 2015-09-23 ENCOUNTER — Telehealth: Payer: Self-pay

## 2015-09-23 ENCOUNTER — Encounter: Payer: Self-pay | Admitting: Emergency Medicine

## 2015-09-23 ENCOUNTER — Ambulatory Visit
Admission: EM | Admit: 2015-09-23 | Discharge: 2015-09-23 | Disposition: A | Discharge status: Home / Self Care | Payer: Medicare Other | Attending: Family Medicine | Admitting: Family Medicine

## 2015-09-23 ENCOUNTER — Ambulatory Visit
Admit: 2015-09-23 | Discharge: 2015-09-23 | Disposition: A | Discharge status: Home / Self Care | Payer: Medicare Other | Attending: Family Medicine | Admitting: Family Medicine

## 2015-09-23 DIAGNOSIS — G319 Degenerative disease of nervous system, unspecified: Secondary | ICD-10-CM | POA: Insufficient documentation

## 2015-09-23 DIAGNOSIS — S0093XA Contusion of unspecified part of head, initial encounter: Secondary | ICD-10-CM | POA: Diagnosis not present

## 2015-09-23 DIAGNOSIS — S0990XA Unspecified injury of head, initial encounter: Secondary | ICD-10-CM

## 2015-09-23 DIAGNOSIS — R51 Headache: Secondary | ICD-10-CM | POA: Diagnosis not present

## 2015-09-23 DIAGNOSIS — S0083XA Contusion of other part of head, initial encounter: Secondary | ICD-10-CM | POA: Diagnosis not present

## 2015-09-23 NOTE — ED Provider Notes (Signed)
CSN: [id number]     Arrival date & time 09/23/15  1218 History   First MD Initiated Contact with Patient 09/23/15 1244    Nurses notes were reviewed. Chief Complaint  Patient presents with  . Head Injury    Patient reports hitting his head Saturday.computer. States that this done them after he was able to move a few minutes he thought he was okay. He never lost consciousness during this whole time but states that since then he's not felt right dizzy and lightheaded. He denies any chest pain shortness of breath. History previous head injury. Patient initially informed me that he was in good health however reviewing his chart he has only ED gout vertigo coronary artery disease and his has history of prostate cancer and skin cancer as well as hyperlipidemia.   He is allergic penicillin and Indocin. He is a former smoker. No pertinent family medical history pertaining to this visit.  (Consider location/radiation/quality/duration/timing/severity/associated sxs/prior Treatment) Patient is a 81 y.o. male presenting with head injury.  Head Injury   Past Medical History  Diagnosis Date  . Hyperlipidemia   . ED (erectile dysfunction)   . Gout, arthritis   . Aural vertigo   . Coronary artery disease [place]  . Prostate cancer ([place]) 05/08/12    Adenocarcinoma,gleason=3+3=6,PSA=8.26,vol=57cc  . History of skin cancer   . OSA (obstructive sleep apnea)     CPAP NON-COMPLIANT   Past Surgical History  Procedure Laterality Date  . Carpectomy Right     wrist  . Knee arthroscopy Bilateral   . Cataract extraction w/ intraocular lens  implant, bilateral    . Foot surgery Bilateral     GOUT  . Total knee arthroplasty Bilateral LEFT 2012/   RIGHT 2006  . Left elbow surgery  AGE 55  &  2007  . Cardiovascular stress test  09-07-2011  dr Saralyn Pilar    normal perfusion/ ef 57%/ no ischemia  . Transthoracic echocardiogram  11-24-2010    normal lvf &  lvh/ mild mr, tr, and ai  . Coronary artery  bypass graft  JULY 2005  ([facility])    LIMA TO THE LAD  . Radioactive seed implant N/A 08/02/2012    Procedure: RADIOACTIVE SEED IMPLANT;  Surgeon: Claybon Jabs, MD;  Location: [facility];  Service: Urology;  Laterality: N/A;  . Appendectomy      Life was saved with emerg. surgery.   . Cardiac surgery     Family History  Problem Relation Age of Onset  . Alzheimer's disease Father   . Cancer Sister 34    treated surgically   Social History  Substance Use Topics  . Smoking status: Former Smoker -- 3.00 packs/day for 31 years    Quit date: 08/01/1990  . Smokeless tobacco: Never Used  . Alcohol Use: Yes     Comment: rarely  6 beers year,    Review of Systems  Allergies  Indocin and Penicillins  Home Medications   Prior to Admission medications   Medication Sig Start Date End Date Taking? Authorizing Provider  allopurinol (ZYLOPRIM) 300 MG tablet One and half tablets daily. Do not stop allopurinol if you have a gout flare. 09/01/09   Historical Provider, MD  aspirin EC 81 MG tablet Take by mouth.    Historical Provider, MD  chlorhexidine (PERIDEX) 0.12 % solution  04/20/15   Historical Provider, MD  hydrocortisone (ANUSOL-HC) 25 MG suppository Place 1 suppository (25 mg total) rectally 2 (two) times daily. Patient  not taking: Reported on 08/05/2015 03/02/15   Arlis Porta., MD  ketoconazole (NIZORAL) 2 % cream Apply 1 application topically as needed. 05/10/15   Historical Provider, MD  meclizine (ANTIVERT) 25 MG tablet Take 1 tablet (25 mg total) by mouth 3 (three) times daily. Patient taking differently: Take 25 mg by mouth 3 (three) times daily as needed.  03/02/15   Arlis Porta., MD  naproxen sodium (ANAPROX) 220 MG tablet Take 220 mg by mouth as needed.     Historical Provider, MD  Potassium 99 MG TABS Take by mouth.    Historical Provider, MD  vitamin B-12 (CYANOCOBALAMIN) 100 MCG tablet Take 50 mcg by mouth daily.     Historical Provider, MD   Meds  Ordered and Administered this Visit  Medications - No data to display  BP 135/72 mmHg  Pulse 81  Temp(Src) 97.9 F (36.6 C) (Oral)  Resp 16  Ht 5\' 10"  (1.778 m)  Wt 220 lb (99.791 kg)  BMI 31.57 kg/m2  SpO2 96% No data found.   Physical Exam  Constitutional: He is oriented to person, place, and time. He appears well-developed and well-nourished.  HENT:  Head: Normocephalic and atraumatic.  Right Ear: External ear normal.  Left Ear: External ear normal.  Eyes: Conjunctivae are normal. Pupils are equal, round, and reactive to light.  Neck: Normal range of motion. Neck supple.  Pulmonary/Chest: Effort normal.  Musculoskeletal: Normal range of motion.  Neurological: He is alert and oriented to person, place, and time.  Skin: Skin is warm and dry.  Psychiatric: He has a normal mood and affect. His behavior is normal.  Vitals reviewed.   ED Course  Procedures (including critical care time)  Labs Review Labs Reviewed - No data to display  Imaging Review No results found.   Visual Acuity Review  Right Eye Distance:   Left Eye Distance:   Bilateral Distance:    Right Eye Near:   Left Eye Near:    Bilateral Near:         MDM   1. Head injury due to trauma, initial encounter    Recommend CT of the head. No CT tech is present at this facility today. Recommend him going to [facility] outpatient at [facility]. He is elected to go outpatient [facility] for CT scan of the head. Bedtime.think he has had a concussion precautions 1 visit bleed and hopefully CT should be able tell us that at this time.    Frederich Cha, MD 09/23/15 [phone]

## 2015-09-23 NOTE — ED Notes (Signed)
Patient states that he stood up and hit the front of his head on his mantle on Saturday.  Patient reports HA, soreness behind his eyes and feels off balance when he walks.  Patient denies LOC.  Patient has no laceration.

## 2015-09-23 NOTE — ED Notes (Signed)
Spoke with pt. Patient advised of all CT results and verbalized understanding. Patient will call back with any questions or concerns. [facility]

## 2015-09-23 NOTE — Discharge Instructions (Signed)
Concussion, Adult A concussion is a brain injury. It is caused by:  A hit to the head.  A quick and sudden movement (jolt) of the head or neck. A concussion is usually not life threatening. Even so, it can cause serious problems. If you had a concussion before, you may have concussion-like problems after a hit to your head. HOME CARE General Instructions  Follow your doctor's directions carefully.  Take medicines only as told by your doctor.  Only take medicines your doctor says are safe.  Do not drink alcohol until your doctor says it is okay. Alcohol and some drugs can slow down healing. They can also put you at risk for further injury.  If you are having trouble remembering things, write them down.  Try to do one thing at a time if you get distracted easily. For example, do not watch TV while making dinner.  Talk to your family members or close friends when making important decisions.  Follow up with your doctor as told.  Watch your symptoms. Tell others to do the same. Serious problems can sometimes happen after a concussion. Older adults are more likely to have these problems.  Tell your teachers, school nurse, school counselor, coach, Product/process development scientist, or work Freight forwarder about your concussion. Tell them about what you can or cannot do. They should watch to see if:  It gets even harder for you to pay attention or concentrate.  It gets even harder for you to remember things or learn new things.  You need more time than normal to finish things.  You become annoyed (irritable) more than before.  You are not able to deal with stress as well.  You have more problems than before.  Rest. Make sure you:  Get plenty of sleep at night.  Go to sleep early.  Go to bed at the same time every day. Try to wake up at the same time.  Rest during the day.  Take naps when you feel tired.  Limit activities where you have to think a lot or concentrate. These include:  Doing  homework.  Doing work related to a job.  Watching TV.  Using the computer. Returning To Your Regular Activities Return to your normal activities slowly, not all at once. You must give your body and brain enough time to heal.   Do not play sports or do other athletic activities until your doctor says it is okay.  Ask your doctor when you can drive, ride a bicycle, or work other vehicles or machines. Never do these things if you feel dizzy.  Ask your doctor about when you can return to work or school. Preventing Another Concussion It is very important to avoid another brain injury, especially before you have healed. In rare cases, another injury can lead to permanent brain damage, brain swelling, or death. The risk of this is greatest during the first 7-10 days after your injury. Avoid injuries by:   Wearing a seat belt when riding in a car.  Not drinking too much alcohol.  Avoiding activities that could lead to a second concussion (such as contact sports).  Wearing a helmet when doing activities like:  Biking.  Skiing.  Skateboarding.  Skating.  Making your home safer by:  Removing things from the floor or stairways that could make you trip.  Using grab bars in bathrooms and handrails by stairs.  Placing non-slip mats on floors and in bathtubs.  Improve lighting in dark areas. GET HELP IF:  It gets even harder for you to pay attention or concentrate.  It gets even harder for you to remember things or learn new things.  You need more time than normal to finish things.  You become annoyed (irritable) more than before.  You are not able to deal with stress as well.  You have more problems than before.  You have problems keeping your balance.  You are not able to react quickly when you should. Get help if you have any of these problems for more than 2 weeks:   Lasting (chronic) headaches.  Dizziness or trouble balancing.  Feeling sick to your stomach  (nausea).  Seeing (vision) problems.  Being affected by noises or light more than normal.  Feeling sad, low, down in the dumps, blue, gloomy, or empty (depressed).  Mood changes (mood swings).  Feeling of fear or nervousness about what may happen (anxiety).  Feeling annoyed.  Memory problems.  Problems concentrating or paying attention.  Sleep problems.  Feeling tired all the time. GET HELP RIGHT AWAY IF:   You have bad headaches or your headaches get worse.  You have weakness (even if it is in one hand, leg, or part of the face).  You have loss of feeling (numbness).  You feel off balance.  You keep throwing up (vomiting).  You feel tired.  One black center of your eye (pupil) is larger than the other.  You twitch or shake violently (convulse).  Your speech is not clear (slurred).  You are more confused, easily angered (agitated), or annoyed than before.  You have more trouble resting than before.  You are unable to recognize people or places.  You have neck pain.  It is difficult to wake you up.  You have unusual behavior changes.  You pass out (lose consciousness). MAKE SURE YOU:   Understand these instructions.  Will watch your condition.  Will get help right away if you are not doing well or get worse.   This information is not intended to replace advice given to you by your health care provider. Make sure you discuss any questions you have with your health care provider.   Document Released: 03/01/2009 Document Revised: 04/03/2014 Document Reviewed: 10/03/2012 Elsevier Interactive Patient Education 2016 [place] Injury, Adult You have a head injury. Headaches and throwing up (vomiting) are common after a head injury. It should be easy to wake up from sleeping. Sometimes you must stay in the hospital. Most problems happen within the first 24 hours. Side effects may occur up to 7-10 days after the injury.  WHAT ARE THE TYPES OF HEAD  INJURIES? Head injuries can be as minor as a bump. Some head injuries can be more severe. More severe head injuries include:  A jarring injury to the brain (concussion).  A bruise of the brain (contusion). This mean there is bleeding in the brain that can cause swelling.  A cracked skull (skull fracture).  Bleeding in the brain that collects, clots, and forms a bump (hematoma). WHEN SHOULD I GET HELP RIGHT AWAY?   You are confused or sleepy.  You cannot be woken up.  You feel sick to your stomach (nauseous) or keep throwing up (vomiting).  Your dizziness or unsteadiness is getting worse.  You have very bad, lasting headaches that are not helped by medicine. Take medicines only as told by your doctor.  You cannot use your arms or legs like normal.  You cannot walk.  You notice changes in  the black spots in the center of the colored part of your eye (pupil).  You have clear or bloody fluid coming from your nose or ears.  You have trouble seeing. During the next 24 hours after the injury, you must stay with someone who can watch you. This person should get help right away (call 911 in the [place] if you start to shake and are not able to control it (have seizures), you pass out, or you are unable to wake up. HOW CAN I PREVENT A HEAD INJURY IN THE FUTURE?  Wear seat belts.  Wear a helmet while bike riding and playing sports like football.  Stay away from dangerous activities around the house. WHEN CAN I RETURN TO NORMAL ACTIVITIES AND ATHLETICS? See your doctor before doing these activities. You should not do normal activities or play contact sports until 1 week after the following symptoms have stopped:  Headache that does not go away.  Dizziness.  Poor attention.  Confusion.  Memory problems.  Sickness to your stomach or throwing up.  Tiredness.  Fussiness.  Bothered by bright lights or loud noises.  Anxiousness or depression.  Restless sleep. MAKE SURE YOU:    Understand these instructions.  Will watch your condition.  Will get help right away if you are not doing well or get worse.   This information is not intended to replace advice given to you by your health care provider. Make sure you discuss any questions you have with your health care provider.   Document Released: 02/24/2008 Document Revised: 04/03/2014 Document Reviewed: 11/18/2012 Elsevier Interactive Patient Education [employer].

## 2015-10-18 DIAGNOSIS — M19011 Primary osteoarthritis, right shoulder: Secondary | ICD-10-CM | POA: Diagnosis not present

## 2015-10-18 DIAGNOSIS — M25511 Pain in right shoulder: Secondary | ICD-10-CM | POA: Diagnosis not present

## 2015-12-02 DIAGNOSIS — C61 Malignant neoplasm of prostate: Secondary | ICD-10-CM | POA: Diagnosis not present

## 2015-12-09 DIAGNOSIS — Z8546 Personal history of malignant neoplasm of prostate: Secondary | ICD-10-CM | POA: Diagnosis not present

## 2015-12-15 ENCOUNTER — Other Ambulatory Visit: Payer: Self-pay | Admitting: Family Medicine

## 2015-12-31 DIAGNOSIS — J069 Acute upper respiratory infection, unspecified: Secondary | ICD-10-CM | POA: Diagnosis not present

## 2016-01-03 ENCOUNTER — Ambulatory Visit (INDEPENDENT_AMBULATORY_CARE_PROVIDER_SITE_OTHER): Payer: Medicare Other | Admitting: Family Medicine

## 2016-01-03 ENCOUNTER — Ambulatory Visit
Admission: RE | Admit: 2016-01-03 | Discharge: 2016-01-03 | Disposition: A | Discharge status: Home / Self Care | Payer: Medicare Other | Source: Ambulatory Visit | Attending: Family Medicine | Admitting: Family Medicine

## 2016-01-03 ENCOUNTER — Encounter: Payer: Self-pay | Admitting: Family Medicine

## 2016-01-03 VITALS — BP 139/84 | HR 81 | Temp 98.9°F | Resp 16 | Ht 71.0 in | Wt 223.0 lb

## 2016-01-03 DIAGNOSIS — R05 Cough: Secondary | ICD-10-CM | POA: Insufficient documentation

## 2016-01-03 DIAGNOSIS — R059 Cough, unspecified: Secondary | ICD-10-CM

## 2016-01-03 DIAGNOSIS — J40 Bronchitis, not specified as acute or chronic: Secondary | ICD-10-CM | POA: Diagnosis not present

## 2016-01-03 MED ORDER — PREDNISONE 10 MG PO TABS: ORAL_TABLET | ORAL | 0 refills | Status: DC

## 2016-01-03 MED ORDER — AZITHROMYCIN 250 MG PO TABS: ORAL_TABLET | ORAL | 0 refills | Status: DC

## 2016-01-03 NOTE — Progress Notes (Signed)
Name: Phillip Nelson   MRN: [id number]    DOB: [birth year]   Date:01/03/2016       Progress Note  Subjective  Chief Complaint  Chief Complaint  Patient presents with  . Pneumonia    HPI Here c/o cough x 4 days.  Cough productive of clear sputum.  SOB.  He has been exposed to severe bronchitis at home with prolonged course.  No fever.  HE was seen at an Urgent Care 3 days ago and treated with [facility].  No problem-specific Assessment & Plan notes found for this encounter.   Past Medical History:  Diagnosis Date  . Aural vertigo   . Coronary artery disease [place]  . ED (erectile dysfunction)   . Gout, arthritis   . History of skin cancer   . Hyperlipidemia   . OSA (obstructive sleep apnea)    CPAP NON-COMPLIANT  . Prostate cancer ([place]) 05/08/12   Adenocarcinoma,gleason=3+3=6,PSA=8.26,vol=57cc    Social History  Substance Use Topics  . Smoking status: Former Smoker    Packs/day: 3.00    Years: 31.00    Quit date: 08/01/1990  . Smokeless tobacco: Never Used  . Alcohol use Yes     Comment: rarely  6 beers year,     Current Outpatient Prescriptions:  .  allopurinol (ZYLOPRIM) 300 MG tablet, TAKE ONE (1) TABLET BY MOUTH EVERY DAY AS DIRECTED, Disp: 30 tablet, Rfl: 3 .  aspirin EC 81 MG tablet, Take by mouth., Disp: , Rfl:  .  chlorhexidine (PERIDEX) 0.12 % solution, , Disp: , Rfl:  .  hydrocortisone (ANUSOL-HC) 25 MG suppository, Place 1 suppository (25 mg total) rectally 2 (two) times daily., Disp: 12 suppository, Rfl: 0 .  hydrocortisone 2.5 % cream, Apply 1 application topically as needed., Disp: , Rfl:  .  ketoconazole (NIZORAL) 2 % cream, Apply 1 application topically as needed., Disp: , Rfl:  .  meclizine (ANTIVERT) 25 MG tablet, Take 1 tablet (25 mg total) by mouth 3 (three) times daily. (Patient taking differently: Take 25 mg by mouth 3 (three) times daily as needed. ), Disp: 30 tablet, Rfl: 6 .  naproxen sodium (ANAPROX) 220 MG tablet, Take 220 mg by  mouth as needed. , Disp: , Rfl:  .  Potassium 99 MG TABS, Take by mouth., Disp: , Rfl:  .  vitamin B-12 (CYANOCOBALAMIN) 100 MCG tablet, Take 50 mcg by mouth daily. , Disp: , Rfl:  .  sulfamethoxazole-trimethoprim (BACTRIM DS,SEPTRA DS) 800-160 MG tablet, Take 1 tablet by mouth 2 (two) times daily., Disp: , Rfl:   Allergies  Allergen Reactions  . Indocin [Indomethacin] Other (See Comments)    Shakes. tremors  . Penicillins Itching and Rash    Review of Systems  Constitutional: Negative for chills, fever, malaise/fatigue and weight loss.  HENT: Positive for congestion. Negative for hearing loss and sore throat.   Eyes: Negative for blurred vision and double vision.  Respiratory: Positive for cough, sputum production, shortness of breath and wheezing (occ.).   Cardiovascular: Negative for chest pain, palpitations and leg swelling.  Gastrointestinal: Negative for abdominal pain, blood in stool and heartburn.  Genitourinary: Negative for dysuria, frequency and urgency.  Musculoskeletal: Negative for joint pain and myalgias.  Neurological: Negative for dizziness, tremors, weakness and headaches.      Objective  Vitals:   01/03/16 1437  BP: 139/84  Pulse: 81  Resp: 16  Temp: 98.9 F (37.2 C)  TempSrc: Oral  Weight: 223 lb (101.2 kg)  Height: 5'  11" (1.803 m)     Physical Exam  Constitutional: He is oriented to person, place, and time and well-developed, well-nourished, and in no distress. No distress.  HENT:  Head: Normocephalic and atraumatic.  Right Ear: External ear normal.  Left Ear: External ear normal.  Nose: Mucosal edema and rhinorrhea present. Right sinus exhibits no maxillary sinus tenderness and no frontal sinus tenderness. Left sinus exhibits no maxillary sinus tenderness and no frontal sinus tenderness.  Mouth/Throat: Oropharynx is clear and moist.  Eyes: Conjunctivae and EOM are normal. Pupils are equal, round, and reactive to light.  Neck: Normal range of  motion. Neck supple. No thyromegaly present.  Cardiovascular: Normal rate, regular rhythm and normal heart sounds.  Exam reveals no gallop and no friction rub.   No murmur heard. Pulmonary/Chest: Effort normal. No respiratory distress. He has no wheezes. He has no rales.  Coarse breath sounds throughout with deep cough a times.  Musculoskeletal: He exhibits no edema.  Lymphadenopathy:    He has no cervical adenopathy.  Neurological: He is alert and oriented to person, place, and time.  Vitals reviewed.     No results found for this or any previous visit (from the past 2160 hour(s)).   Assessment & Plan  1. Cough  - DG Chest 2 View; Future  2. Bronchitis Z-pak Prednisone 30 mg/d x 5 days

## 2016-01-10 ENCOUNTER — Telehealth: Payer: Self-pay | Admitting: Family Medicine

## 2016-01-10 NOTE — Telephone Encounter (Signed)
I would like to see him since this has continued for 10 days.  He may need another chest xray since things have not resolved.   Otherwise, I would just be guessing about what would be best to do.   If he cannot get appt to see me, . Dr. Raliegh Ip may be able to see him in next day of so.

## 2016-01-10 NOTE — Telephone Encounter (Signed)
Pt still has cough and congestion.  Please call [phone]

## 2016-01-10 NOTE — Telephone Encounter (Signed)
Patient scheduled appt for 01/11/16.

## 2016-01-11 ENCOUNTER — Encounter: Payer: Self-pay | Admitting: Family Medicine

## 2016-01-11 ENCOUNTER — Ambulatory Visit (INDEPENDENT_AMBULATORY_CARE_PROVIDER_SITE_OTHER): Payer: Medicare Other | Admitting: Family Medicine

## 2016-01-11 VITALS — BP 140/84 | HR 91 | Temp 98.0°F | Resp 16 | Ht 71.0 in | Wt 223.0 lb

## 2016-01-11 DIAGNOSIS — J209 Acute bronchitis, unspecified: Secondary | ICD-10-CM

## 2016-01-11 MED ORDER — ALBUTEROL SULFATE HFA 108 (90 BASE) MCG/ACT IN AERS: 2.0000 | INHALATION_SPRAY | Freq: Four times a day (QID) | RESPIRATORY_TRACT | 0 refills | Status: DC | PRN

## 2016-01-11 NOTE — Progress Notes (Signed)
Subjective:    Patient ID: Phillip Nelson, male    DOB: October 02, 1934, 81 y.o.   MRN: [id number]  Phillip Nelson is a 81 y.o. male presenting on 01/11/2016 for Cough  Patient presents for a same day appointment.  HPI  ACUTE BRONCHITIS, Recurrent: - Last seen on 01/03/16 at [facility] for same complaint of bronchitis, at that time symptoms present for about 1 week or less, with significant sick contact of wife at home having similar bronchitis (duration about 3 weeks), he was treated with Azithromycin Z pak and Prednisone 30mg  daily x 5 days, also Mucinex-DM, had chest x-ray (negative) - Today presents for follow-up given worsening symptoms. He states finished prednisone and azithromycin course x 5 days, cough and symptoms dramatically improved over weekend for 1-2 days, then gradual worsening again and return of productive cough (sputum is mostly clear to milky without blood or purulence), he started sneezing as well. Still taking Mucinex-DM x 1 week now, tries to stay well hydrated - Uses CPAP machine nightly for OSA - Former smoker, no history of COPD, no residual lung disease or inhaler use - Denies fevers/chills, congestion, sinus pressure headache, chest pain, dyspnea, nausea, vomiting  Social History  Substance Use Topics  . Smoking status: Former Smoker    Packs/day: 3.00    Years: 31.00    Quit date: 08/01/1990  . Smokeless tobacco: Never Used  . Alcohol use Yes     Comment: rarely  6 beers year,    Review of Systems Per HPI unless specifically indicated above     Objective:    BP 140/84 (BP Location: Right Arm, Patient Position: Sitting, Cuff Size: Large)   Pulse 91   Temp 98 F (36.7 C) (Oral)   Resp 16   Ht 5\' 11"  (1.803 m)   Wt 223 lb (101.2 kg)   BMI 31.10 kg/m   Wt Readings from Last 3 Encounters:  01/11/16 223 lb (101.2 kg)  01/03/16 223 lb (101.2 kg)  09/23/15 220 lb (99.8 kg)    Physical Exam  Constitutional: He appears well-developed and  well-nourished. No distress.  Well-appearing, comfortable, cooperative  HENT:  Head: Normocephalic and atraumatic.  Mouth/Throat: Oropharynx is clear and moist.  Eyes: Conjunctivae are normal. Right eye exhibits no discharge. Left eye exhibits no discharge.  Neck: Normal range of motion. Neck supple. No thyromegaly present.  Cardiovascular: Normal rate, regular rhythm, normal heart sounds and intact distal pulses.   No murmur heard. Pulmonary/Chest: Effort normal and breath sounds normal. No respiratory distress.  Mild reduced air movement bilaterally. Coarse breath sounds generalized without exp wheezing or focal crackles. Some improvement with cough with transmitted upper airway sounds.  Musculoskeletal: He exhibits no edema.  Lymphadenopathy:    He has no cervical adenopathy.  Neurological: He is alert.  Skin: Skin is warm and dry. No rash noted. He is not diaphoretic.  Nursing note and vitals reviewed.  I have personally reviewed recent Chest X-ray ordered by other provider at last visit, reviewed images and radiology read. - My interpretation is no evidence of any acute pulmonary disease, no infiltrate, consolidation, or effusion. Normal appearance of heart and borders. S/p prior CABG surgery.  01/03/16 Chest X-ray 2  View FINDINGS: Normal heart size and mediastinal contours. Status post CABG. There is no edema, consolidation, effusion, or pneumothorax. Stable density at the bilateral apex attributed to first costochondral spurring.  IMPRESSION: Stable.  No evidence of acute disease.      Assessment & Plan:  Problem List Items Addressed This Visit    None    Visit Diagnoses    Acute bronchitis, unspecified organism    -  Primary  Consistent with worsening / persistent bronchitis in setting of likely initial viral URI (+sick contact wife same symptoms x 3 weeks). Concern with duration >1 week with improvement after prednisone/azithro, and then recurrent worsening.  Reviewed last CXR 10/9 negative. - Afebrile, no focal signs of infection, not consistent with pneumonia by history or exam, not consistent with sinusitis. No clear wheezing on exam but lungs coarse and some mild reduced air movement.  Plan: 1. Reassurance suspect will still continue improve, may have lingering cough still, also cannot exclude re-infection of same or different viral URI 2. Trial on Albuterol inhaler 2 puffs q 6 hr x 3 days to help open airways given reduced air movement (prior history of smoking no formal dx COPD) - demonstrated inhaler use and advised to ask pharmacist as well 3. Mutual decision to not repeat steroids or antibiotic today, hold off on repeat X-ray given no focal lung findings and afebrile, however if worsening productive cough, febrile, dyspnea then advised patient to follow-up or notify us, and would consider sending in Levaquin course 500mg  daily for 7 days 4. Continue Mucinex-DM x 2-3 days then stop 5. Return criteria reviewed, follow-up within 1 week if not improved     Relevant Medications   albuterol (PROVENTIL HFA;VENTOLIN HFA) 108 (90 Base) MCG/ACT inhaler      Meds ordered this encounter  Medications  . albuterol (PROVENTIL HFA;VENTOLIN HFA) 108 (90 Base) MCG/ACT inhaler    Sig: Inhale 2 puffs into the lungs every 6 (six) hours as needed for wheezing or shortness of breath (cough).    Dispense:  1 Inhaler    Refill:  0      Follow up plan: Return in about 1 week (around 01/18/2016), or if symptoms worsen or fail to improve, for bronchitis.  Phillip Nelson, [facility] 01/11/2016, 10:56 AM

## 2016-01-11 NOTE — Patient Instructions (Addendum)
Thank you for coming in to clinic today.  1. It sounds like you had an Upper Respiratory Virus that has settled into a Bronchitis, lower respiratory tract infection. I don't have concerns for pneumonia today (prior x-ray negative for pneumonia and you don't have fevers), and think that this should gradually improve. Once you are feeling better, the cough may take a few weeks to fully resolve. - Start Albuterol inhaler 2 puffs every 6 hours for next 2-3 days, as discussed (ask pharmacist to demonstrate proper use), then use only as needed, it may make you feel a little jittery and heart race, this is only temporary - May continue Mucinex, for 2-3 more days then stop this medicine, sometimes cough improves OFF of mucinex if it has done its job - Drink plenty of fluids to improve congestion - You may try over the counter Nasal Saline spray (Simply Saline, Ocean Spray) as needed to reduce congestion.  Please call us and leave a detailed message with one of our nurses for me, about how you are doing, if seem to worsen instead of improve over next several days, including significant fever / chills, nausea / vomiting, shortness of breath, then we could consider starting a different antibiotic course such as Levaquin or Doxycycline.  Please schedule a follow-up appointment with Dr. Luan Pulling or Dr Parks Ranger if not improving within 1 week for Bronchitis  If you have any other questions or concerns, please feel free to call the clinic or send a message through [place]. You may also schedule an earlier appointment if necessary.  [name], DO [facility], [facility]  Neurology  Dementia Support Services Lutricia Horsfall (OT, Dementia Specialist) [place], [place] [address] Ph [phone] [email or web address]

## 2016-02-01 DIAGNOSIS — Z23 Encounter for immunization: Secondary | ICD-10-CM | POA: Diagnosis not present

## 2016-03-22 DIAGNOSIS — H47323 Drusen of optic disc, bilateral: Secondary | ICD-10-CM | POA: Diagnosis not present

## 2016-03-23 DIAGNOSIS — L578 Other skin changes due to chronic exposure to nonionizing radiation: Secondary | ICD-10-CM | POA: Diagnosis not present

## 2016-03-23 DIAGNOSIS — I788 Other diseases of capillaries: Secondary | ICD-10-CM | POA: Diagnosis not present

## 2016-03-23 DIAGNOSIS — L820 Inflamed seborrheic keratosis: Secondary | ICD-10-CM | POA: Diagnosis not present

## 2016-03-23 DIAGNOSIS — L821 Other seborrheic keratosis: Secondary | ICD-10-CM | POA: Diagnosis not present

## 2016-03-23 DIAGNOSIS — L570 Actinic keratosis: Secondary | ICD-10-CM | POA: Diagnosis not present

## 2016-06-08 DIAGNOSIS — L821 Other seborrheic keratosis: Secondary | ICD-10-CM | POA: Diagnosis not present

## 2016-06-08 DIAGNOSIS — L304 Erythema intertrigo: Secondary | ICD-10-CM | POA: Diagnosis not present

## 2016-06-08 DIAGNOSIS — L219 Seborrheic dermatitis, unspecified: Secondary | ICD-10-CM | POA: Diagnosis not present

## 2016-06-08 DIAGNOSIS — L570 Actinic keratosis: Secondary | ICD-10-CM | POA: Diagnosis not present

## 2016-06-08 DIAGNOSIS — L820 Inflamed seborrheic keratosis: Secondary | ICD-10-CM | POA: Diagnosis not present

## 2016-06-08 DIAGNOSIS — I788 Other diseases of capillaries: Secondary | ICD-10-CM | POA: Diagnosis not present

## 2016-06-08 DIAGNOSIS — L578 Other skin changes due to chronic exposure to nonionizing radiation: Secondary | ICD-10-CM | POA: Diagnosis not present

## 2016-06-09 DIAGNOSIS — R079 Chest pain, unspecified: Secondary | ICD-10-CM | POA: Diagnosis not present

## 2016-06-09 DIAGNOSIS — I209 Angina pectoris, unspecified: Secondary | ICD-10-CM | POA: Diagnosis not present

## 2016-06-09 DIAGNOSIS — I25118 Atherosclerotic heart disease of native coronary artery with other forms of angina pectoris: Secondary | ICD-10-CM | POA: Diagnosis not present

## 2016-06-09 DIAGNOSIS — G4733 Obstructive sleep apnea (adult) (pediatric): Secondary | ICD-10-CM | POA: Diagnosis not present

## 2016-06-09 DIAGNOSIS — M1A00X0 Idiopathic chronic gout, unspecified site, without tophus (tophi): Secondary | ICD-10-CM | POA: Diagnosis not present

## 2016-06-09 DIAGNOSIS — E785 Hyperlipidemia, unspecified: Secondary | ICD-10-CM | POA: Diagnosis not present

## 2016-06-09 DIAGNOSIS — I10 Essential (primary) hypertension: Secondary | ICD-10-CM | POA: Diagnosis not present

## 2016-06-09 DIAGNOSIS — Z951 Presence of aortocoronary bypass graft: Secondary | ICD-10-CM | POA: Diagnosis not present

## 2016-06-22 DIAGNOSIS — I10 Essential (primary) hypertension: Secondary | ICD-10-CM | POA: Diagnosis not present

## 2016-06-22 DIAGNOSIS — I208 Other forms of angina pectoris: Secondary | ICD-10-CM | POA: Insufficient documentation

## 2016-06-22 DIAGNOSIS — I2089 Other forms of angina pectoris: Secondary | ICD-10-CM

## 2016-06-22 DIAGNOSIS — E782 Mixed hyperlipidemia: Secondary | ICD-10-CM | POA: Diagnosis not present

## 2016-06-22 DIAGNOSIS — I25118 Atherosclerotic heart disease of native coronary artery with other forms of angina pectoris: Secondary | ICD-10-CM | POA: Diagnosis not present

## 2016-06-22 HISTORY — DX: Other forms of angina pectoris: I20.89

## 2016-07-10 ENCOUNTER — Ambulatory Visit (INDEPENDENT_AMBULATORY_CARE_PROVIDER_SITE_OTHER): Payer: Medicare Other | Admitting: Nurse Practitioner

## 2016-07-10 ENCOUNTER — Encounter: Payer: Self-pay | Admitting: Nurse Practitioner

## 2016-07-10 VITALS — BP 139/74 | HR 75 | Temp 97.9°F | Resp 16 | Ht 71.0 in | Wt 227.0 lb

## 2016-07-10 DIAGNOSIS — J069 Acute upper respiratory infection, unspecified: Secondary | ICD-10-CM | POA: Diagnosis not present

## 2016-07-10 NOTE — Patient Instructions (Addendum)
Phillip Nelson, Thank you for coming in to clinic today.  1. This is likely a viral upper respiratory infection this will most likely run it's course in 7 to 10 days. Recommend good hand washing. - Continue anti-histamine loratadine (Claritin) 10mg  daily.  You can also use Flonase 2 sprays each nostril daily for up to 4-6 weeks. - Continue OTC Mucinex (or may try Mucinex-DM for cough) up to 5-10 days then stop. - Drink plenty of fluids to improve congestion - You may try over the counter Nasal Saline spray (Simply Saline, Ocean Spray) as needed to reduce congestion. - Drink warm herbal tea with honey for sore throat. - Start taking Tylenol extra strength 1 to 2 tablets every 6-8 hours for aches or fever/chills for next few days as needed.  Do not take more than 3,000 mg in 24 hours from all medicines.  If symptoms significantly worsening with persistent fevers/chills despite tylenol, nausea, vomiting unable to tolerate food/fluids or medicine, body aches, or shortness of breath, sinus pain pressure or worsening productive cough, then follow-up for re-evaluation, may seek more immediate care at Urgent Care or ED if more concerned for emergency.   Please schedule a follow-up appointment with [name], AGNP or call clinic in 5-7 days from today if symptoms worsen or do not improve.    If you have any other questions or concerns, please feel free to call the clinic or send a message through [place]. You may also schedule an earlier appointment if necessary.  [name], DNP, AGNP-BC Adult Gerontology Nurse Practitioner [facility], [facility]    Upper Respiratory Infection, Adult Most upper respiratory infections (URIs) are a viral infection of the air passages leading to the lungs. A URI affects the nose, throat, and upper air passages. The most common type of URI is nasopharyngitis and is typically referred to as "the common cold." URIs run their course and usually go away  on their own. Most of the time, a URI does not require medical attention, but sometimes a bacterial infection in the upper airways can follow a viral infection. This is called a secondary infection. Sinus and middle ear infections are common types of secondary upper respiratory infections. Bacterial pneumonia can also complicate a URI. A URI can worsen asthma and chronic obstructive pulmonary disease (COPD). Sometimes, these complications can require emergency medical care and may be life threatening. What are the causes? Almost all URIs are caused by viruses. A virus is a type of germ and can spread from one person to another. What increases the risk? You may be at risk for a URI if:  You smoke.  You have chronic heart or lung disease.  You have a weakened defense (immune) system.  You are very young or very old.  You have nasal allergies or asthma.  You work in crowded or poorly ventilated areas.  You work in health care facilities or schools. What are the signs or symptoms? Symptoms typically develop 2-3 days after you come in contact with a cold virus. Most viral URIs last 7-10 days. However, viral URIs from the influenza virus (flu virus) can last 14-18 days and are typically more severe. Symptoms may include:  Runny or stuffy (congested) nose.  Sneezing.  Cough.  Sore throat.  Headache.  Fatigue.  Fever.  Loss of appetite.  Pain in your forehead, behind your eyes, and over your cheekbones (sinus pain).  Muscle aches. How is this diagnosed? Your health care provider may diagnose a URI  by:  Physical exam.  Tests to check that your symptoms are not due to another condition such as:  Strep throat.  Sinusitis.  Pneumonia.  Asthma. How is this treated? A URI goes away on its own with time. It cannot be cured with medicines, but medicines may be prescribed or recommended to relieve symptoms. Medicines may help:  Reduce your fever.  Reduce your  cough.  Relieve nasal congestion. Follow these instructions at home:  Take medicines only as directed by your health care provider.  Gargle warm saltwater or take cough drops to comfort your throat as directed by your health care provider.  Use a warm mist humidifier or inhale steam from a shower to increase air moisture. This may make it easier to breathe.  Drink enough fluid to keep your urine clear or pale yellow.  Eat soups and other clear broths and maintain good nutrition.  Rest as needed.  Return to work when your temperature has returned to normal or as your health care provider advises. You may need to stay home longer to avoid infecting others. You can also use a face mask and careful hand washing to prevent spread of the virus.  Increase the usage of your inhaler if you have asthma.  Do not use any tobacco products, including cigarettes, chewing tobacco, or electronic cigarettes. If you need help quitting, ask your health care provider. How is this prevented? The best way to protect yourself from getting a cold is to practice good hygiene.  Avoid oral or hand contact with people with cold symptoms.  Wash your hands often if contact occurs. There is no clear evidence that vitamin C, vitamin E, echinacea, or exercise reduces the chance of developing a cold. However, it is always recommended to get plenty of rest, exercise, and practice good nutrition. Contact a health care provider if:  You are getting worse rather than better.  Your symptoms are not controlled by medicine.  You have chills.  You have worsening shortness of breath.  You have brown or red mucus.  You have yellow or brown nasal discharge.  You have pain in your face, especially when you bend forward.  You have a fever.  You have swollen neck glands.  You have pain while swallowing.  You have white areas in the back of your throat. Get help right away if:  You have severe or  persistent:  Headache.  Ear pain.  Sinus pain.  Chest pain.  You have chronic lung disease and any of the following:  Wheezing.  Prolonged cough.  Coughing up blood.  A change in your usual mucus.  You have a stiff neck.  You have changes in your:  Vision.  Hearing.  Thinking.  Mood. This information is not intended to replace advice given to you by your health care provider. Make sure you discuss any questions you have with your health care provider. Document Released: 09/06/2000 Document Revised: 11/14/2015 Document Reviewed: 06/18/2013 Elsevier Interactive Patient Education  2017 [employer].

## 2016-07-10 NOTE — Progress Notes (Signed)
I have reviewed this encounter including the documentation in this note and/or discussed this patient with the provider, [name], AGPCNP-BC. I am certifying that I agree with the content of this note as supervising physician.  [name], [facility] 07/10/2016, 1:05 PM

## 2016-07-10 NOTE — Progress Notes (Signed)
Subjective:    Patient ID: Phillip Nelson, male    DOB: 1934/10/05, 82 y.o.   MRN: [id number]  Phillip Nelson is a 82 y.o. male presenting on 07/10/2016 for Cough   HPI   Cough started for about 6 mos that did get better after bronchitis, but has lingered.  He has had worsening of his cough over the last 5 days.  He has a productive cough with yellow sputum.  phlegm.   He has had minor sinus pressure that was relieved with Claritin.  He took Claritin for about 3 days.  He has also been taking Mucinex DM for his cough with some relief.   He has also had some shortness of breath with exertion that is relieved at rest.  Some minor headache / sinus congestion.  Denies rinorrhea, sinus tenderness, sore throat, No fevers, chills, sweats, n/v, diarrhea.   Social History  Substance Use Topics  . Smoking status: Former Smoker    Packs/day: 3.00    Years: 31.00    Quit date: 08/01/1990  . Smokeless tobacco: Never Used  . Alcohol use Yes     Comment: rarely  6 beers year,    Review of Systems Per HPI unless specifically indicated above     Objective:    BP 139/74 (BP Location: Left Arm, Patient Position: Sitting, Cuff Size: Large)   Pulse 75   Temp 97.9 F (36.6 C) (Oral)   Resp 16   Ht 5\' 11"  (1.803 m)   Wt 227 lb (103 kg)   BMI 31.66 kg/m   Wt Readings from Last 3 Encounters:  07/10/16 227 lb (103 kg)  01/11/16 223 lb (101.2 kg)  01/03/16 223 lb (101.2 kg)    Physical Exam  Constitutional: He is oriented to person, place, and time. He appears well-developed and well-nourished. No distress.  HENT:  Head: Normocephalic and atraumatic.  Right Ear: Tympanic membrane, external ear and ear canal normal.  Left Ear: External ear and ear canal normal.  Nose: Mucosal edema and rhinorrhea present. Right sinus exhibits maxillary sinus tenderness. Right sinus exhibits no frontal sinus tenderness. Left sinus exhibits maxillary sinus tenderness. Left sinus exhibits no frontal  sinus tenderness.  Mouth/Throat: Uvula is midline and mucous membranes are normal. Posterior oropharyngeal edema present. No posterior oropharyngeal erythema or tonsillar abscesses.  cobblestoning  Neck: Normal range of motion. Neck supple. No JVD present. No tracheal deviation present. No thyromegaly present.  Cardiovascular: Normal rate, regular rhythm, normal heart sounds and intact distal pulses.   Pulmonary/Chest: Effort normal and breath sounds normal. No respiratory distress. He has no wheezes. He has no rales. He exhibits no tenderness.  egophony absent  Lymphadenopathy:    He has no cervical adenopathy.  Neurological: He is alert and oriented to person, place, and time.  Skin: Skin is warm and dry.  Psychiatric: He has a normal mood and affect. His behavior is normal. Judgment and thought content normal.       Assessment & Plan:   Problem List Items Addressed This Visit    None    Visit Diagnoses    Upper respiratory infection, viral    -  Primary Consistent with viral URI x 5-6 days, with no known sick contacts. Afebrile, well hydrated on exam, no focal signs of infection (ears, throat, lungs clear).  Bronchitis in last 6 months.  No signs/symptoms of active bronchitis or secondary pneumonia.  Plan: 1. Reassurance, likely self-limited with cough lasting up to few weeks. -  Continue anti-histamine loratadine 10mg  daily, also can use Flonase 2 sprays each nostril daily for up to 4-6 weeks - Contunue Mucinex-DM OTC up to 5-10 days then stop 2. Supportive care with nasal saline, warm herbal tea with honey, 3. Improve hydration 4. Tylenol PRN fevers 5. Return criteria given. Consider antibiotic in 5-7 days from today.  Penicillin allergy. Pt states azithromycin didn't work well last time he needed it.       Meds ordered this encounter  Medications  . isosorbide mononitrate (IMDUR) 30 MG 24 hr tablet    Sig: Take 30 mg by mouth daily.  . metoprolol succinate (TOPROL-XL) 25  MG 24 hr tablet    Sig: Take 12.5 mg by mouth daily.   . rosuvastatin (CRESTOR) 5 MG tablet    Sig: Take 5 mg by mouth daily as needed.    Follow up plan: Return if symptoms worsen or fail to improve.  [name], DNP, AGPCNP-BC Adult Gerontology Primary Care Nurse Practitioner [facility] 07/10/2016, 12:36 PM

## 2016-07-12 ENCOUNTER — Telehealth: Payer: Self-pay | Admitting: Nurse Practitioner

## 2016-07-12 DIAGNOSIS — J0140 Acute pansinusitis, unspecified: Secondary | ICD-10-CM

## 2016-07-12 MED ORDER — DOXYCYCLINE HYCLATE 100 MG PO CAPS: 200.0000 mg | ORAL_CAPSULE | Freq: Every day | ORAL | 0 refills | Status: AC

## 2016-07-12 MED ORDER — FLUTICASONE PROPIONATE 50 MCG/ACT NA SUSP: 2.0000 | Freq: Every day | NASAL | 6 refills | Status: DC

## 2016-07-12 NOTE — Telephone Encounter (Signed)
Pt said he still has chest congestion and is losing his voice now.  He asked to have a  prescription for flonas and antibiotics sent to [facility].  His call back number is [phone]

## 2016-07-12 NOTE — Telephone Encounter (Signed)
Orders have been sent to [facility] in [place]

## 2016-07-31 DIAGNOSIS — I208 Other forms of angina pectoris: Secondary | ICD-10-CM | POA: Diagnosis not present

## 2016-07-31 HISTORY — PX: NM GATED MYOCARDIAL STUDY (ARMX HX): HXRAD1849

## 2016-07-31 HISTORY — PX: TRANSTHORACIC ECHOCARDIOGRAM: SHX275

## 2016-08-07 DIAGNOSIS — I208 Other forms of angina pectoris: Secondary | ICD-10-CM | POA: Diagnosis not present

## 2016-08-07 DIAGNOSIS — G4733 Obstructive sleep apnea (adult) (pediatric): Secondary | ICD-10-CM | POA: Diagnosis not present

## 2016-08-07 DIAGNOSIS — I10 Essential (primary) hypertension: Secondary | ICD-10-CM | POA: Diagnosis not present

## 2016-08-07 DIAGNOSIS — I25118 Atherosclerotic heart disease of native coronary artery with other forms of angina pectoris: Secondary | ICD-10-CM | POA: Diagnosis not present

## 2016-08-08 ENCOUNTER — Other Ambulatory Visit: Payer: Self-pay

## 2016-08-08 DIAGNOSIS — M109 Gout, unspecified: Secondary | ICD-10-CM

## 2016-08-08 MED ORDER — ALLOPURINOL 300 MG PO TABS: 300.0000 mg | ORAL_TABLET | Freq: Every day | ORAL | 6 refills | Status: DC

## 2016-08-11 ENCOUNTER — Encounter: Payer: Self-pay | Admitting: Nurse Practitioner

## 2016-08-11 ENCOUNTER — Ambulatory Visit (INDEPENDENT_AMBULATORY_CARE_PROVIDER_SITE_OTHER): Payer: Medicare Other | Admitting: Nurse Practitioner

## 2016-08-11 VITALS — BP 124/70 | HR 90 | Temp 98.4°F | Ht 70.0 in | Wt 222.6 lb

## 2016-08-11 DIAGNOSIS — J069 Acute upper respiratory infection, unspecified: Secondary | ICD-10-CM

## 2016-08-11 MED ORDER — IPRATROPIUM BROMIDE 0.03 % NA SOLN: 2.0000 | Freq: Three times a day (TID) | NASAL | 12 refills | Status: DC

## 2016-08-11 NOTE — Patient Instructions (Addendum)
Phillip Nelson, Thank you for coming in to clinic today.  1. It sounds like you have a Upper Respiratory Virus - this will most likely run it's course in 7 to 10 days. Recommend good hand washing. - Start Atrovent nasal spray decongestant 2 sprays each nostril up to 4 times daily for 5-7 days - Start anti-histamine loratadine 10mg  daily, also can use Flonase 2 sprays each nostril daily for up to 4-6 weeks or Nasocort if easier to use. - If congestion is worse, start OTC Mucinex (or may try Mucinex-DM for cough) up to 7-10 days then stop - Drink plenty of fluids to improve congestion - You may try over the counter Nasal Saline spray (Simply Saline, Ocean Spray) as needed to reduce congestion. - Drink warm herbal tea with honey for sore throat. - Start taking Tylenol extra strength 1 to 2 tablets every 6-8 hours for aches or fever/chills for next few days as needed.  Do not take more than 3,000 mg in 24 hours from all medicines.  May take Ibuprofen as well if tolerated 200-400mg  every 8 hours as needed.   If symptoms significantly worsening with persistent fevers/chills despite tylenol/ibpurofen, nausea, vomiting unable to tolerate food/fluids or medicine, body aches, or shortness of breath, sinus pain pressure or worsening productive cough, then follow-up for re-evaluation, may seek more immediate care at Urgent Care or ED if more concerned for emergency.  I would recommend a non-urgent ENT referral to Dr. Tami Ribas for recurrent sinus infections.    2. For your left ear - there is earwax pushed to your eardrum.  Use debrox solution for about 1 week and then wipe away with a kleenex or washcloth.  You may irrigate with a bulb if needed.  Please schedule a follow-up appointment with [name], AGNP to Return 5-7 days if symptoms worsen or fail to improve.  If you have any other questions or concerns, please feel free to call the clinic or send a message through [place]. You may also schedule  an earlier appointment if necessary.  [name], DNP, AGNP-BC Adult Gerontology Nurse Practitioner [place]

## 2016-08-11 NOTE — Progress Notes (Signed)
Subjective:    Patient ID: Phillip Nelson, male    DOB: [birth year], 82 y.o.   MRN: [id number]  Phillip Nelson is a 81 y.o. male presenting on 08/11/2016 for Cough (sore throat, back pain, and headache x 52mths)   HPI  URI symptoms Worsened again yesterday.  His symptoms are nasal congestion, sinus pressure, pnd, headache,  No sleep with severe cough.  Sit in recliner all night.  Sore throat (using cough drops).  Cough medicine not helping.    Persistent upper respiratory congestion.  Pt declines Xray. Does have a deviated septum  Prefers [facility]  Not taking claritin or any other medication regularly. Notes that he purchased and tried to use Flonase at last visit, but not able to get the delivery devices to work.   Social History  Substance Use Topics  . Smoking status: Former Smoker    Packs/day: 3.00    Years: 31.00    Quit date: 08/01/1990  . Smokeless tobacco: Never Used  . Alcohol use Yes     Comment: rarely  6 beers year,    Review of Systems Per HPI unless specifically indicated above     Objective:    BP 124/70   Pulse 90   Temp 98.4 F (36.9 C) (Oral)   Ht 5\' 10"  (1.778 m)   Wt 222 lb 9.6 oz (101 kg)   SpO2 97%   BMI 31.94 kg/m   Wt Readings from Last 3 Encounters:  08/11/16 222 lb 9.6 oz (101 kg)  07/10/16 227 lb (103 kg)  01/11/16 223 lb (101.2 kg)    Physical Exam  Constitutional: He is oriented to person, place, and time. He appears well-developed and well-nourished. No distress.  HENT:  Head: Normocephalic and atraumatic.  Right Ear: Hearing, tympanic membrane, external ear and ear canal normal.  Left Ear: Hearing, tympanic membrane, external ear and ear canal normal.  Nose: Mucosal edema and rhinorrhea present. Right sinus exhibits maxillary sinus tenderness and frontal sinus tenderness. Left sinus exhibits maxillary sinus tenderness and frontal sinus tenderness.  Mouth/Throat: Uvula is midline, oropharynx is clear  and moist and mucous membranes are normal.  Neck: Normal range of motion. Neck supple.  Cardiovascular: Normal rate and regular rhythm.   Pulmonary/Chest: Effort normal and breath sounds normal. No respiratory distress.  Lymphadenopathy:    He has no cervical adenopathy.  Neurological: He is alert and oriented to person, place, and time.  Skin: Skin is warm and dry.  Psychiatric: He has a normal mood and affect. His behavior is normal. Judgment and thought content normal.       Assessment & Plan:   Problem List Items Addressed This Visit    None    Visit Diagnoses    Upper respiratory tract infection, unspecified type    -  Primary Acute illness. Fever responsive to NSAIDs and tylenol.  Symptoms not worsening. Consistent with viral illness x 2 days with no known sick contacts and no identifiable focal infections of ears, nose, throat.  Plan: 1. Reassurance, likely self-limited with cough lasting up to few weeks - Start Atrovent nasal spray decongestant 2 sprays each nostril up to 4 times daily for 5-7 days - Start anti-histamine Loratadine 10mg  daily,  - also can use Flonase 2 sprays each nostril daily for up to 4-6 weeks OR nasocort if easier to use - Start Mucinex-DM OTC up to 7-10 days then stop 2. Supportive care with nasal saline, warm herbal tea  with honey, 3. Improve hydration 4. Tylenol / Motrin PRN fevers 5. Return criteria given 5-7 days with no improvement 6. ENT referral to pt preference Dr. Tami Ribas to evaluate recurrent URI over last 6 months.    Relevant Medications   ipratropium (ATROVENT) 0.03 % nasal spray   Other Relevant Orders   Ambulatory referral to ENT      Meds ordered this encounter  Medications  . ipratropium (ATROVENT) 0.03 % nasal spray    Sig: Place 2 sprays into both nostrils 3 (three) times daily.    Dispense:  30 mL    Refill:  12    Order Specific Question:   Supervising Provider    Answer:   Olin Hauser [[id number]]    Follow  up plan: Return in about 6 months (around 02/11/2017), or 5-7 days if symptoms worsen or fail to improve, for medicare wellness and annual physical.   [name], DNP, AGPCNP-BC Adult Gerontology Primary Care Nurse Practitioner [facility] 08/11/2016, 1:59 PM

## 2016-08-14 NOTE — Progress Notes (Signed)
I have reviewed this encounter including the documentation in this note and/or discussed this patient with the provider, [name], AGPCNP-BC. I am certifying that I agree with the content of this note as supervising physician.  [name], [facility] 08/14/2016, 4:57 PM

## 2016-08-26 ENCOUNTER — Emergency Department: Payer: Medicare Other

## 2016-08-26 ENCOUNTER — Emergency Department
Admission: EM | Admit: 2016-08-26 | Discharge: 2016-08-26 | Disposition: A | Discharge status: Home / Self Care | Payer: Medicare Other | Attending: Emergency Medicine | Admitting: Emergency Medicine

## 2016-08-26 DIAGNOSIS — Y9353 Activity, golf: Secondary | ICD-10-CM | POA: Diagnosis not present

## 2016-08-26 DIAGNOSIS — R05 Cough: Secondary | ICD-10-CM | POA: Diagnosis not present

## 2016-08-26 DIAGNOSIS — S4992XA Unspecified injury of left shoulder and upper arm, initial encounter: Secondary | ICD-10-CM | POA: Diagnosis present

## 2016-08-26 DIAGNOSIS — X501XXA Overexertion from prolonged static or awkward postures, initial encounter: Secondary | ICD-10-CM | POA: Insufficient documentation

## 2016-08-26 DIAGNOSIS — M25512 Pain in left shoulder: Secondary | ICD-10-CM

## 2016-08-26 DIAGNOSIS — Y929 Unspecified place or not applicable: Secondary | ICD-10-CM | POA: Insufficient documentation

## 2016-08-26 DIAGNOSIS — S46912A Strain of unspecified muscle, fascia and tendon at shoulder and upper arm level, left arm, initial encounter: Secondary | ICD-10-CM | POA: Insufficient documentation

## 2016-08-26 DIAGNOSIS — Z8546 Personal history of malignant neoplasm of prostate: Secondary | ICD-10-CM | POA: Diagnosis not present

## 2016-08-26 DIAGNOSIS — Z7982 Long term (current) use of aspirin: Secondary | ICD-10-CM | POA: Insufficient documentation

## 2016-08-26 DIAGNOSIS — Y999 Unspecified external cause status: Secondary | ICD-10-CM | POA: Insufficient documentation

## 2016-08-26 DIAGNOSIS — Z79899 Other long term (current) drug therapy: Secondary | ICD-10-CM | POA: Insufficient documentation

## 2016-08-26 DIAGNOSIS — I2510 Atherosclerotic heart disease of native coronary artery without angina pectoris: Secondary | ICD-10-CM | POA: Diagnosis not present

## 2016-08-26 DIAGNOSIS — Z87891 Personal history of nicotine dependence: Secondary | ICD-10-CM | POA: Insufficient documentation

## 2016-08-26 DIAGNOSIS — Z85828 Personal history of other malignant neoplasm of skin: Secondary | ICD-10-CM | POA: Insufficient documentation

## 2016-08-26 HISTORY — DX: Other forms of angina pectoris: I20.8

## 2016-08-26 LAB — CBC
HCT: 44.4 % (ref 40.0–52.0)
Hemoglobin: 15.4 g/dL (ref 13.0–18.0)
MCH: 33.3 pg (ref 26.0–34.0)
MCHC: 34.6 g/dL (ref 32.0–36.0)
MCV: 96.1 fL (ref 80.0–100.0)
Platelets: 248 10*3/uL (ref 150–440)
RBC: 4.63 MIL/uL (ref 4.40–5.90)
RDW: 12.8 % (ref 11.5–14.5)
WBC: 12.2 10*3/uL — ABNORMAL HIGH (ref 3.8–10.6)

## 2016-08-26 LAB — COMPREHENSIVE METABOLIC PANEL
ALT: 37 U/L (ref 17–63)
AST: 36 U/L (ref 15–41)
Albumin: 4.3 g/dL (ref 3.5–5.0)
Alkaline Phosphatase: 60 U/L (ref 38–126)
Anion gap: 9 (ref 5–15)
BUN: 20 mg/dL (ref 6–20)
CO2: 28 mmol/L (ref 22–32)
Calcium: 9.4 mg/dL (ref 8.9–10.3)
Chloride: 102 mmol/L (ref 101–111)
Creatinine, Ser: 1.26 mg/dL — ABNORMAL HIGH (ref 0.61–1.24)
GFR calc Af Amer: >60 mL/min (ref >60)
GFR calc non Af Amer: 52 mL/min — ABNORMAL LOW (ref >60)
Glucose, Bld: 133 mg/dL — ABNORMAL HIGH (ref 65–99)
Potassium: 4.9 mmol/L (ref 3.5–5.1)
Sodium: 139 mmol/L (ref 135–145)
Total Bilirubin: 1.1 mg/dL (ref 0.3–1.2)
Total Protein: 7.9 g/dL (ref 6.5–8.1)

## 2016-08-26 LAB — TROPONIN I: Troponin I: <0.03 ng/mL (ref <0.03)

## 2016-08-26 MED ORDER — MORPHINE SULFATE (PF) 2 MG/ML IV SOLN
INTRAVENOUS | Status: AC
  Filled 2016-08-26: qty 1

## 2016-08-26 MED ORDER — ONDANSETRON 4 MG PO TBDP
4.0000 mg | ORAL_TABLET | Freq: Once | ORAL | Status: AC
  Administered 2016-08-26: 4 mg via ORAL

## 2016-08-26 MED ORDER — OXYCODONE-ACETAMINOPHEN 5-325 MG PO TABS
1.0000 | ORAL_TABLET | Freq: Once | ORAL | Status: DC
  Filled 2016-08-26: qty 1

## 2016-08-26 MED ORDER — CYCLOBENZAPRINE HCL 5 MG PO TABS: ORAL_TABLET | ORAL | 0 refills | Status: DC

## 2016-08-26 MED ORDER — MORPHINE SULFATE (PF) 2 MG/ML IV SOLN
2.0000 mg | Freq: Once | INTRAVENOUS | Status: AC
  Administered 2016-08-26: 2 mg via INTRAMUSCULAR

## 2016-08-26 MED ORDER — OXYCODONE-ACETAMINOPHEN 5-325 MG PO TABS: 1.0000 | ORAL_TABLET | ORAL | 0 refills | Status: DC | PRN

## 2016-08-26 MED ORDER — CYCLOBENZAPRINE HCL 10 MG PO TABS
5.0000 mg | ORAL_TABLET | Freq: Once | ORAL | Status: AC
  Administered 2016-08-26: 5 mg via ORAL
  Filled 2016-08-26: qty 1

## 2016-08-26 MED ORDER — ONDANSETRON 4 MG PO TBDP
ORAL_TABLET | ORAL | Status: AC
  Filled 2016-08-26: qty 1

## 2016-08-26 NOTE — ED Triage Notes (Signed)
Patient c/o left shoulder pain back. Pt reports accompanying symptoms of weakness. Pt reports shoulder began to hurt two days after golfing. Pt denies injury.

## 2016-08-26 NOTE — ED Notes (Signed)
Dr. Sung at bedside.  

## 2016-08-26 NOTE — Discharge Instructions (Signed)
1. You may take medicines as needed for pain & muscle spasms (Percocet/Flexeril #15). 2. Wear sling as needed for comfort. 3. Return to the ER for worsening symptoms, persistent vomiting, difficulty breathing or other concerns.

## 2016-08-26 NOTE — ED Provider Notes (Signed)
[facility] Emergency Department Provider Note   ____________________________________________   First MD Initiated Contact with Patient 08/26/16 2309     (approximate)  I have reviewed the triage vital signs and the nursing notes.   HISTORY  Chief Complaint Shoulder Pain (left)    HPI Phillip Nelson is a 82 y.o. male who presents to the ED from home with a chief complaint of nontraumatic shoulder pain. Patient reports left shoulder pain the morning after he went golfing. He did not golf more than usual, however, he tried a new grip by rotating his left hand inward. Patient is left-hand dominant. Complains of pain to posterior shoulder which is worsened with movement. Denies associated extremity weakness, numbness or tingling. Denies recent fever, chills, neck pain, chest pain, shortness of breath, abdominal pain, nausea, vomiting, diarrhea. Denies recent travel or trauma. Patient took 2 ibuprofen prior to arrival without relief of symptoms.   Past Medical History:  Diagnosis Date  . Angina at rest [facility])   . Aural vertigo   . Coronary artery disease [place]  . ED (erectile dysfunction)   . Gout, arthritis   . History of skin cancer   . Hyperlipidemia   . OSA (obstructive sleep apnea)    CPAP NON-COMPLIANT  . Prostate cancer ([place]) 05/08/12   Adenocarcinoma,gleason=3+3=6,PSA=8.26,vol=57cc    Patient Active Problem List   Diagnosis Date Noted  . Stable angina pectoris ([place]) 06/22/2016  . Sleep apnea 04/29/2015  . Colon polyps 03/02/2015  . Rectal bleeding 03/02/2015  . Benign essential HTN 07/20/2014  . CAD (coronary artery disease) 07/20/2014  . Chest pain 07/20/2014  . Hyperlipemia, mixed 07/20/2014  . OSA (obstructive sleep apnea) 07/20/2014  . Contusion of knee 09/16/2013  . Prostate cancer ([place]) 05/08/2012    Past Surgical History:  Procedure Laterality Date  . APPENDECTOMY     Life was saved with emerg. surgery.   Marland Kitchen  CARDIAC SURGERY    . CARDIOVASCULAR STRESS TEST  09-07-2011  dr Saralyn Pilar   normal perfusion/ ef 57%/ no ischemia  . CARPECTOMY Right    wrist  . CATARACT EXTRACTION W/ INTRAOCULAR LENS  IMPLANT, BILATERAL    . CORONARY ARTERY BYPASS GRAFT  JULY 2005  ([facility])   LIMA TO THE LAD  . FOOT SURGERY Bilateral    GOUT  . KNEE ARTHROSCOPY Bilateral   . LEFT ELBOW SURGERY  AGE 37  &  2007  . RADIOACTIVE SEED IMPLANT N/A 08/02/2012   Procedure: RADIOACTIVE SEED IMPLANT;  Surgeon: Claybon Jabs, MD;  Location: [facility];  Service: Urology;  Laterality: N/A;  . TOTAL KNEE ARTHROPLASTY Bilateral LEFT 2012/   RIGHT 2006  . TRANSTHORACIC ECHOCARDIOGRAM  11-24-2010   normal lvf &  lvh/ mild mr, tr, and ai    Prior to Admission medications   Medication Sig Start Date End Date Taking? Authorizing Provider  albuterol (PROVENTIL HFA;VENTOLIN HFA) 108 (90 Base) MCG/ACT inhaler Inhale 2 puffs into the lungs every 6 (six) hours as needed for wheezing or shortness of breath (cough). 01/11/16   Karamalegos, Devonne Doughty, DO  allopurinol (ZYLOPRIM) 300 MG tablet TAKE ONE (1) TABLET BY MOUTH EVERY DAY AS DIRECTED 12/16/15   Arlis Porta., MD  allopurinol (ZYLOPRIM) 300 MG tablet Take 1 tablet (300 mg total) by mouth daily. Patient not taking: Reported on 08/11/2016 08/08/16   Mikey College, NP  aspirin EC 81 MG tablet Take by mouth.    [provider]  cyclobenzaprine (FLEXERIL) 5 MG tablet 1 tablet every 8 hours as needed for muscle spasms 08/26/16   Paulette Blanch, MD  fluticasone [facility]) 50 MCG/ACT nasal spray Place 2 sprays into both nostrils daily. Patient not taking: Reported on 08/11/2016 07/12/16   Mikey College, NP  hydrocortisone 2.5 % cream Apply 1 application topically as needed. 09/07/15   [provider]  ipratropium (ATROVENT) 0.03 % nasal spray Place 2 sprays into both nostrils 3 (three) times daily. 08/11/16   Mikey College, NP  isosorbide  mononitrate (IMDUR) 30 MG 24 hr tablet Take 30 mg by mouth daily. 06/09/16 06/09/17  [provider]  meclizine (ANTIVERT) 25 MG tablet Take 1 tablet (25 mg total) by mouth 3 (three) times daily. Patient not taking: Reported on 08/11/2016 03/02/15   Arlis Porta., MD  metoprolol succinate (TOPROL-XL) 25 MG 24 hr tablet Take 12.5 mg by mouth daily.  06/09/16   [provider]  naproxen sodium (ANAPROX) 220 MG tablet Take 220 mg by mouth as needed.     [provider]  oxyCODONE-acetaminophen (ROXICET) 5-325 MG tablet Take 1 tablet by mouth every 4 (four) hours as needed for severe pain. 08/26/16   Paulette Blanch, MD  Potassium 99 MG TABS Take by mouth.    [provider]  rosuvastatin (CRESTOR) 5 MG tablet Take 5 mg by mouth daily as needed. 06/09/16 06/09/17  [provider]  vitamin B-12 (CYANOCOBALAMIN) 100 MCG tablet Take 50 mcg by mouth daily.     [provider]    Allergies Indocin [indomethacin] and Penicillins  Family History  Problem Relation Age of Onset  . Alzheimer's disease Father   . Cancer Sister 28       treated surgically    Social History Social History  Substance Use Topics  . Smoking status: Former Smoker    Packs/day: 3.00    Years: 31.00    Quit date: 08/01/1990  . Smokeless tobacco: Never Used  . Alcohol use Yes     Comment: rarely  6 beers year,    Review of Systems  Constitutional: No fever/chills Eyes: No visual changes. ENT: No sore throat. Cardiovascular: Denies chest pain. Respiratory: Denies shortness of breath. Gastrointestinal: No abdominal pain.  No nausea, no vomiting.  No diarrhea.  No constipation. Genitourinary: Negative for dysuria. Musculoskeletal: Positive for left shoulder pain. Negative for back pain. Skin: Negative for rash. Neurological: Negative for headaches, focal weakness or numbness.   ____________________________________________   PHYSICAL EXAM:  VITAL SIGNS: ED  Triage Vitals  Enc Vitals Group     BP 08/26/16 2039 (!) 151/80     Pulse Rate 08/26/16 2039 82     Resp 08/26/16 2039 15     Temp 08/26/16 2039 97.8 F (36.6 C)     Temp Source 08/26/16 2039 Oral     SpO2 08/26/16 2039 94 %     Weight 08/26/16 2039 218 lb (98.9 kg)     Height 08/26/16 2039 5\' 10"  (1.778 m)     Head Circumference --      Peak Flow --      Pain Score 08/26/16 2038 10     Pain Loc --      Pain Edu? --      Excl. in [place]? --     Constitutional: Alert and oriented. Well appearing and in no acute distress. Eyes: Conjunctivae are normal. PERRL. EOMI. Head: Atraumatic. Nose: No congestion/rhinnorhea. Mouth/Throat: Mucous membranes are  moist.  Oropharynx non-erythematous. Neck: No stridor.  No cervical spine tenderness to palpation.  No carotid bruits. Cardiovascular: Normal rate, regular rhythm. Grossly normal heart sounds.  Good peripheral circulation. Respiratory: Normal respiratory effort.  No retractions. Lungs CTAB. Gastrointestinal: Soft and nontender. No distention. No abdominal bruits. No CVA tenderness. Musculoskeletal: Left posterior shoulder tender to palpation. Full range of motion with pain. Spasms to left posterior shoulder. 2+ radial pulses. Brisk, less than 5 some capillary refill. 5/5 motor strength and sensation. Neurologic:  Normal speech and language. No gross focal neurologic deficits are appreciated. No gait instability. Skin:  Skin is warm, dry and intact. No rash noted. Psychiatric: Mood and affect are normal. Speech and behavior are normal.  ____________________________________________   LABS (all labs ordered are listed, but only abnormal results are displayed)  Labs Reviewed  CBC - Abnormal; Notable for the following:       Result Value   WBC 12.2 (*)    All other components within normal limits  COMPREHENSIVE METABOLIC PANEL - Abnormal; Notable for the following:    Glucose, Bld 133 (*)    Creatinine, Ser 1.26 (*)    GFR calc non Af  Amer 52 (*)    All other components within normal limits  TROPONIN I   ____________________________________________  EKG  ED ECG REPORT I, Salih Williamson J, the attending physician, personally viewed and interpreted this ECG.   Date: 08/26/2016  EKG Time: 2034  Rate: 74  Rhythm: normal EKG, normal sinus rhythm  Axis: LAD  Intervals:first-degree A-V block   ST&T Change: Nonspecific  ____________________________________________  RADIOLOGY  Dg Chest 2 View  Result Date: 08/26/2016 CLINICAL DATA:  Chronic cough.  Initial encounter. EXAM: CHEST  2 VIEW COMPARISON:  Chest radiograph performed 01/03/2016 FINDINGS: The lungs are well-aerated and clear. There is no evidence of focal opacification, pleural effusion or pneumothorax. The heart is normal in size; the mediastinal contour is within normal limits. The patient is status post median sternotomy No acute osseous abnormalities are seen. IMPRESSION: No acute cardiopulmonary process seen. Electronically Signed   By: Garald Balding M.D.   On: 08/26/2016 21:11   Dg Shoulder Left  Result Date: 08/26/2016 CLINICAL DATA:  Acute onset of posterior left shoulder pain while playing golf. Initial encounter. EXAM: LEFT SHOULDER - 2+ VIEW COMPARISON:  None. FINDINGS: There is no evidence of fracture or dislocation. The left humeral head is seated within the glenoid fossa. Cortical irregularity and sclerosis are noted at the glenoid, reflecting degenerative change. Degenerative change is also noted at the left acromioclavicular joint. No significant soft tissue abnormalities are seen. The visualized portions of the left lung are clear. The patient is status post median sternotomy. IMPRESSION: 1. No evidence of acute fracture or dislocation. 2. Mild degenerative change at the left glenohumeral joint. 3. Degenerative change at the left acromioclavicular joint. Electronically Signed   By: Garald Balding M.D.   On: 08/26/2016 21:12     ____________________________________________   PROCEDURES  Procedure(s) performed: None  Procedures  Critical Care performed: No  ____________________________________________   INITIAL IMPRESSION / ASSESSMENT AND PLAN / ED COURSE  Pertinent labs & imaging results that were available during my care of the patient were reviewed by me and considered in my medical decision making (see chart for details).  82 year old male who presents with left shoulder pain after playing golf. EKG and troponin are unremarkable. Reproducible pain on palpation of shoulder. Will place on analgesia, muscle relaxer and patient will follow-up  with his orthopedic doctor next week. Strict return precautions given. Patient verbalizes understanding and agrees with plan of care.      ____________________________________________   FINAL CLINICAL IMPRESSION(S) / ED DIAGNOSES  Final diagnoses:  Strain of left shoulder, initial encounter  Acute pain of left shoulder      NEW MEDICATIONS STARTED DURING THIS VISIT:  New Prescriptions   CYCLOBENZAPRINE (FLEXERIL) 5 MG TABLET    1 tablet every 8 hours as needed for muscle spasms   OXYCODONE-ACETAMINOPHEN (ROXICET) 5-325 MG TABLET    Take 1 tablet by mouth every 4 (four) hours as needed for severe pain.     Note:  This document was prepared using Dragon voice recognition software and may include unintentional dictation errors.    Paulette Blanch, MD 08/27/16 [phone]

## 2016-08-27 ENCOUNTER — Telehealth: Payer: Self-pay | Admitting: Emergency Medicine

## 2016-08-30 DIAGNOSIS — M7582 Other shoulder lesions, left shoulder: Secondary | ICD-10-CM | POA: Diagnosis not present

## 2016-08-30 DIAGNOSIS — M19012 Primary osteoarthritis, left shoulder: Secondary | ICD-10-CM | POA: Diagnosis not present

## 2016-08-31 ENCOUNTER — Other Ambulatory Visit: Payer: Self-pay | Admitting: Orthopedic Surgery

## 2016-08-31 DIAGNOSIS — M25512 Pain in left shoulder: Secondary | ICD-10-CM

## 2016-09-05 ENCOUNTER — Ambulatory Visit
Admission: RE | Admit: 2016-09-05 | Discharge: 2016-09-05 | Disposition: A | Discharge status: Home / Self Care | Payer: Medicare Other | Source: Ambulatory Visit | Attending: Orthopedic Surgery | Admitting: Orthopedic Surgery

## 2016-09-05 DIAGNOSIS — M25812 Other specified joint disorders, left shoulder: Secondary | ICD-10-CM | POA: Diagnosis not present

## 2016-09-05 DIAGNOSIS — M75102 Unspecified rotator cuff tear or rupture of left shoulder, not specified as traumatic: Secondary | ICD-10-CM | POA: Insufficient documentation

## 2016-09-05 DIAGNOSIS — M25512 Pain in left shoulder: Secondary | ICD-10-CM | POA: Diagnosis not present

## 2016-09-05 DIAGNOSIS — M7532 Calcific tendinitis of left shoulder: Secondary | ICD-10-CM | POA: Diagnosis not present

## 2016-09-07 DIAGNOSIS — M25512 Pain in left shoulder: Secondary | ICD-10-CM | POA: Diagnosis not present

## 2016-09-13 DIAGNOSIS — M75112 Incomplete rotator cuff tear or rupture of left shoulder, not specified as traumatic: Secondary | ICD-10-CM | POA: Diagnosis not present

## 2016-09-19 DIAGNOSIS — M25512 Pain in left shoulder: Secondary | ICD-10-CM | POA: Diagnosis not present

## 2016-09-19 DIAGNOSIS — M75112 Incomplete rotator cuff tear or rupture of left shoulder, not specified as traumatic: Secondary | ICD-10-CM | POA: Diagnosis not present

## 2016-09-22 DIAGNOSIS — M75112 Incomplete rotator cuff tear or rupture of left shoulder, not specified as traumatic: Secondary | ICD-10-CM | POA: Diagnosis not present

## 2016-09-22 DIAGNOSIS — M25512 Pain in left shoulder: Secondary | ICD-10-CM | POA: Diagnosis not present

## 2016-09-29 DIAGNOSIS — M25512 Pain in left shoulder: Secondary | ICD-10-CM | POA: Diagnosis not present

## 2016-09-29 DIAGNOSIS — M75112 Incomplete rotator cuff tear or rupture of left shoulder, not specified as traumatic: Secondary | ICD-10-CM | POA: Diagnosis not present

## 2016-10-05 DIAGNOSIS — J309 Allergic rhinitis, unspecified: Secondary | ICD-10-CM | POA: Diagnosis not present

## 2016-10-05 DIAGNOSIS — R51 Headache: Secondary | ICD-10-CM | POA: Diagnosis not present

## 2016-10-05 DIAGNOSIS — R42 Dizziness and giddiness: Secondary | ICD-10-CM | POA: Diagnosis not present

## 2016-10-11 ENCOUNTER — Other Ambulatory Visit (HOSPITAL_COMMUNITY): Payer: Self-pay | Admitting: Orthopedic Surgery

## 2016-10-11 DIAGNOSIS — M25512 Pain in left shoulder: Secondary | ICD-10-CM | POA: Diagnosis not present

## 2016-10-13 ENCOUNTER — Other Ambulatory Visit: Payer: Self-pay

## 2016-10-13 ENCOUNTER — Other Ambulatory Visit (HOSPITAL_COMMUNITY): Payer: Self-pay | Admitting: Orthopedic Surgery

## 2016-10-13 ENCOUNTER — Other Ambulatory Visit: Payer: Self-pay | Admitting: Orthopedic Surgery

## 2016-10-13 DIAGNOSIS — M25819 Other specified joint disorders, unspecified shoulder: Secondary | ICD-10-CM

## 2016-10-24 ENCOUNTER — Ambulatory Visit
Admission: RE | Admit: 2016-10-24 | Discharge: 2016-10-24 | Disposition: A | Discharge status: Home / Self Care | Payer: Medicare Other | Source: Ambulatory Visit | Attending: Orthopedic Surgery | Admitting: Orthopedic Surgery

## 2016-10-24 ENCOUNTER — Encounter: Payer: Self-pay | Admitting: *Deleted

## 2016-10-24 DIAGNOSIS — M25819 Other specified joint disorders, unspecified shoulder: Secondary | ICD-10-CM | POA: Diagnosis not present

## 2016-10-24 NOTE — Procedures (Signed)
Interventional Radiology Procedure Note  Procedure: US guided aspiration of left glenoid notch cyst with local anesthesia.   Findings: ~6-7cc of gelatinous yellow/dark maroon material aspirated via 18g trocar needle.    Complications: None  Recommendations:  - Ok to shower tomorrow - Do not submerge for 7 days - Routine wound care   Signed,  Dulcy Fanny. Earleen Newport, DO

## 2016-10-26 ENCOUNTER — Ambulatory Visit (INDEPENDENT_AMBULATORY_CARE_PROVIDER_SITE_OTHER): Payer: Medicare Other | Admitting: General Surgery

## 2016-10-26 ENCOUNTER — Encounter: Payer: Self-pay | Admitting: General Surgery

## 2016-10-26 VITALS — BP 130/70 | HR 70 | Resp 12 | Ht 72.0 in | Wt 217.0 lb

## 2016-10-26 DIAGNOSIS — K625 Hemorrhage of anus and rectum: Secondary | ICD-10-CM

## 2016-10-26 DIAGNOSIS — Z86010 Personal history of colonic polyps: Secondary | ICD-10-CM

## 2016-10-26 DIAGNOSIS — K5732 Diverticulitis of large intestine without perforation or abscess without bleeding: Secondary | ICD-10-CM

## 2016-10-26 LAB — POC HEMOCCULT BLD/STL (OFFICE/1-CARD/DIAGNOSTIC): Fecal Occult Blood, POC: NEGATIVE

## 2016-10-26 MED ORDER — POLYETHYLENE GLYCOL 3350 17 GM/SCOOP PO POWD: ORAL | 0 refills | Status: DC

## 2016-10-26 NOTE — Progress Notes (Signed)
Patient ID: Phillip Nelson, male   DOB: [birth year], 82 y.o.   MRN: [id number]  Chief Complaint  Patient presents with  . Rectal Bleeding    HPI Phillip Nelson is a 82 y.o. male here today for evaluation of rectal bleeding. Patient states he noticed this about a week ago. He states he had bleeding for three days, no pian. Bright red blood on the paper and in the bowel. He states in the last three days no further bleeding. He has had some similar episodes in the past. He has known diverticulosis and history of colon polyp.  Marland KitchenHPI  Past Medical History:  Diagnosis Date  . Angina at rest [facility])   . Aural vertigo   . Coronary artery disease [place]  . ED (erectile dysfunction)   . Gout, arthritis   . History of skin cancer   . Hyperlipidemia   . OSA (obstructive sleep apnea)    CPAP NON-COMPLIANT  . Prostate cancer ([place]) 05/08/12   Adenocarcinoma,gleason=3+3=6,PSA=8.26,vol=57cc    Past Surgical History:  Procedure Laterality Date  . APPENDECTOMY     Life was saved with emerg. surgery.   Marland Kitchen CARDIAC SURGERY    . CARDIOVASCULAR STRESS TEST  09-07-2011  dr Saralyn Pilar   normal perfusion/ ef 57%/ no ischemia  . CARPECTOMY Right    wrist  . CATARACT EXTRACTION W/ INTRAOCULAR LENS  IMPLANT, BILATERAL    . CORONARY ARTERY BYPASS GRAFT  JULY 2005  ([facility])   LIMA TO THE LAD  . FOOT SURGERY Bilateral    GOUT  . KNEE ARTHROSCOPY Bilateral   . LEFT ELBOW SURGERY  AGE 56  &  2007  . RADIOACTIVE SEED IMPLANT N/A 08/02/2012   Procedure: RADIOACTIVE SEED IMPLANT;  Surgeon: Claybon Jabs, MD;  Location: [facility];  Service: Urology;  Laterality: N/A;  . TOTAL KNEE ARTHROPLASTY Bilateral LEFT 2012/   RIGHT 2006  . TRANSTHORACIC ECHOCARDIOGRAM  11-24-2010   normal lvf &  lvh/ mild mr, tr, and ai    Family History  Problem Relation Age of Onset  . Alzheimer's disease Father   . Cancer Sister 23       treated surgically    Social History Social History  Substance  Use Topics  . Smoking status: Former Smoker    Packs/day: 3.00    Years: 31.00    Quit date: 08/01/1990  . Smokeless tobacco: Never Used  . Alcohol use Yes     Comment: rarely  6 beers year,    Allergies  Allergen Reactions  . Indocin [Indomethacin] Other (See Comments)    Shakes. tremors  . Penicillins Itching and Rash    Current Outpatient Prescriptions  Medication Sig Dispense Refill  . albuterol (PROVENTIL HFA;VENTOLIN HFA) 108 (90 Base) MCG/ACT inhaler Inhale 2 puffs into the lungs every 6 (six) hours as needed for wheezing or shortness of breath (cough). 1 Inhaler 0  . allopurinol (ZYLOPRIM) 300 MG tablet TAKE ONE (1) TABLET BY MOUTH EVERY DAY AS DIRECTED 30 tablet 3  . allopurinol (ZYLOPRIM) 300 MG tablet Take 1 tablet (300 mg total) by mouth daily. 30 tablet 6  . aspirin EC 81 MG tablet Take by mouth.    . cyclobenzaprine (FLEXERIL) 5 MG tablet 1 tablet every 8 hours as needed for muscle spasms 15 tablet 0  . fluticasone (FLONASE) 50 MCG/ACT nasal spray Place 2 sprays into both nostrils daily. 16 g 6  . hydrocortisone 2.5 % cream Apply 1 application topically as  needed.    Marland Kitchen ipratropium (ATROVENT) 0.03 % nasal spray Place 2 sprays into both nostrils 3 (three) times daily. 30 mL 12  . isosorbide mononitrate (IMDUR) 30 MG 24 hr tablet Take 30 mg by mouth daily.    . meclizine (ANTIVERT) 25 MG tablet Take 1 tablet (25 mg total) by mouth 3 (three) times daily. 30 tablet 6  . metoprolol succinate (TOPROL-XL) 25 MG 24 hr tablet Take 12.5 mg by mouth daily.     . naproxen sodium (ANAPROX) 220 MG tablet Take 220 mg by mouth as needed.     Marland Kitchen oxyCODONE-acetaminophen (ROXICET) 5-325 MG tablet Take 1 tablet by mouth every 4 (four) hours as needed for severe pain. 15 tablet 0  . Potassium 99 MG TABS Take by mouth.    . rosuvastatin (CRESTOR) 5 MG tablet Take 5 mg by mouth daily as needed.    . vitamin B-12 (CYANOCOBALAMIN) 100 MCG tablet Take 50 mcg by mouth daily.     . polyethylene  glycol powder (GLYCOLAX/MIRALAX) powder 255 grams one bottle for colonoscopy prep 255 g 0   No current facility-administered medications for this visit.     Review of Systems Review of Systems  Constitutional: Negative.   Respiratory: Negative.   Cardiovascular: Negative.   Gastrointestinal: Positive for anal bleeding.    Blood pressure 130/70, pulse 70, resp. rate 12, height 6' (1.829 m), weight 217 lb (98.4 kg).  Physical Exam Physical Exam  Constitutional: He is oriented to person, place, and time. He appears well-developed and well-nourished.  Eyes: Conjunctivae are normal. No scleral icterus.  Neck: Neck supple.  Cardiovascular: Normal rate, regular rhythm and normal heart sounds.   Pulmonary/Chest: Effort normal.  Abdominal: Soft. Bowel sounds are normal. There is no tenderness.  Genitourinary: Rectal exam shows no external hemorrhoid, no internal hemorrhoid, no fissure, no mass, no tenderness, anal tone normal and guaiac negative stool.  Lymphadenopathy:    He has no cervical adenopathy.  Neurological: He is alert and oriented to person, place, and time.  Skin: Skin is warm.    Data Reviewed Prior notes reviewed   Assessment   rectal bleeding-likely from minor trauma to the anterolateral rectal mucosa No apparent findings in the anorectal exam to account for his episode of bleeding. Small amount of stool obtained is heme-negative. Patient is due for surveillance colonoscopy for history of polyp.    Plan   Patient advised. He is agreeable to proceed with colonoscopy    Colonoscopy with possible biopsy/polypectomy prn: Information regarding the procedure, including its potential risks and complications (including but not limited to perforation of the bowel, which may require emergency surgery to repair, and bleeding) was verbally given to the patient. Educational information regarding lower intestinal endoscopy was given to the patient. Written instructions for how to  complete the bowel prep using Miralax were provided. The importance of drinking ample fluids to avoid dehydration as a result of the prep emphasized.  I have completed the exam and reviewed the above documentation for accuracy and completeness.  I agree with the above.  Haematologist has been used and any errors in dictation or transcription are unintentional.  Belford Pascucci G. Jamal Collin, M.D., F.A.C.S.  Junie Panning G 10/27/2016, 8:44 AM  Patient has been scheduled for a colonoscopy on 10-31-16 at [facility]. Miralax prescription has been sent in to the patient's pharmacy today. Colonoscopy instructions have been reviewed with the patient. This patient is aware to call the office if they have further questions.  Dominga Ferry, CMA

## 2016-10-26 NOTE — Patient Instructions (Signed)
Colonoscopy, Adult A colonoscopy is an exam to look at the entire large intestine. During the exam, a lubricated, bendable tube is inserted into the anus and then passed into the rectum, colon, and other parts of the large intestine. A colonoscopy is often done as a part of normal colorectal screening or in response to certain symptoms, such as anemia, persistent diarrhea, abdominal pain, and blood in the stool. The exam can help screen for and diagnose medical problems, including:  Tumors.  Polyps.  Inflammation.  Areas of bleeding.  Tell a health care provider about:  Any allergies you have.  All medicines you are taking, including vitamins, herbs, eye drops, creams, and over-the-counter medicines.  Any problems you or family members have had with anesthetic medicines.  Any blood disorders you have.  Any surgeries you have had.  Any medical conditions you have.  Any problems you have had passing stool. What are the risks? Generally, this is a safe procedure. However, problems may occur, including:  Bleeding.  A tear in the intestine.  A reaction to medicines given during the exam.  Infection (rare).  What happens before the procedure? Eating and drinking restrictions Follow instructions from your health care provider about eating and drinking, which may include:  A few days before the procedure - follow a low-fiber diet. Avoid nuts, seeds, dried fruit, raw fruits, and vegetables.  1-3 days before the procedure - follow a clear liquid diet. Drink only clear liquids, such as clear broth or bouillon, black coffee or tea, clear juice, clear soft drinks or sports drinks, gelatin dessert, and popsicles. Avoid any liquids that contain red or purple dye.  On the day of the procedure - do not eat or drink anything during the 2 hours before the procedure, or within the time period that your health care provider recommends.  Bowel prep If you were prescribed an oral bowel prep  to clean out your colon:  Take it as told by your health care provider. Starting the day before your procedure, you will need to drink a large amount of medicated liquid. The liquid will cause you to have multiple loose stools until your stool is almost clear or light green.  If your skin or anus gets irritated from diarrhea, you may use these to relieve the irritation: ? Medicated wipes, such as adult wet wipes with aloe and vitamin E. ? A skin soothing-product like petroleum jelly.  If you vomit while drinking the bowel prep, take a break for up to 60 minutes and then begin the bowel prep again. If vomiting continues and you cannot take the bowel prep without vomiting, call your health care provider.  General instructions  Ask your health care provider about changing or stopping your regular medicines. This is especially important if you are taking diabetes medicines or blood thinners.  Plan to have someone take you home from the hospital or clinic. What happens during the procedure?  An IV tube may be inserted into one of your veins.  You will be given medicine to help you relax (sedative).  To reduce your risk of infection: ? Your health care team will wash or sanitize their hands. ? Your anal area will be washed with soap.  You will be asked to lie on your side with your knees bent.  Your health care provider will lubricate a long, thin, flexible tube. The tube will have a camera and a light on the end.  The tube will be inserted into your   anus.  The tube will be gently eased through your rectum and colon.  Air will be delivered into your colon to keep it open. You may feel some pressure or cramping.  The camera will be used to take images during the procedure.  A small tissue sample may be removed from your body to be examined under a microscope (biopsy). If any potential problems are found, the tissue will be sent to a lab for testing.  If small polyps are found, your  health care provider may remove them and have them checked for cancer cells.  The tube that was inserted into your anus will be slowly removed. The procedure may vary among health care providers and hospitals. What happens after the procedure?  Your blood pressure, heart rate, breathing rate, and blood oxygen level will be monitored until the medicines you were given have worn off.  Do not drive for 24 hours after the exam.  You may have a small amount of blood in your stool.  You may pass gas and have mild abdominal cramping or bloating due to the air that was used to inflate your colon during the exam.  It is up to you to get the results of your procedure. Ask your health care provider, or the department performing the procedure, when your results will be ready. This information is not intended to replace advice given to you by your health care provider. Make sure you discuss any questions you have with your health care provider. Document Released: 03/10/2000 Document Revised: 01/12/2016 Document Reviewed: 05/25/2015 Elsevier Interactive Patient Education  2018 Elsevier Inc.  

## 2016-10-30 ENCOUNTER — Encounter: Payer: Self-pay | Admitting: *Deleted

## 2016-10-30 DIAGNOSIS — R58 Hemorrhage, not elsewhere classified: Secondary | ICD-10-CM | POA: Diagnosis not present

## 2016-10-30 DIAGNOSIS — R208 Other disturbances of skin sensation: Secondary | ICD-10-CM | POA: Diagnosis not present

## 2016-10-30 DIAGNOSIS — D485 Neoplasm of uncertain behavior of skin: Secondary | ICD-10-CM | POA: Diagnosis not present

## 2016-10-30 DIAGNOSIS — R234 Changes in skin texture: Secondary | ICD-10-CM | POA: Diagnosis not present

## 2016-10-30 DIAGNOSIS — C44612 Basal cell carcinoma of skin of right upper limb, including shoulder: Secondary | ICD-10-CM | POA: Diagnosis not present

## 2016-10-30 DIAGNOSIS — L821 Other seborrheic keratosis: Secondary | ICD-10-CM | POA: Diagnosis not present

## 2016-10-31 ENCOUNTER — Ambulatory Visit
Admission: RE | Admit: 2016-10-31 | Discharge: 2016-10-31 | Disposition: A | Discharge status: Home / Self Care | Payer: Medicare Other | Source: Ambulatory Visit | Attending: General Surgery | Admitting: General Surgery

## 2016-10-31 ENCOUNTER — Encounter
Admission: RE | Disposition: A | Discharge status: Home / Self Care | Payer: Self-pay | Source: Ambulatory Visit | Attending: General Surgery

## 2016-10-31 ENCOUNTER — Encounter: Payer: Self-pay | Admitting: *Deleted

## 2016-10-31 ENCOUNTER — Ambulatory Visit: Payer: Medicare Other | Admitting: Anesthesiology

## 2016-10-31 DIAGNOSIS — D122 Benign neoplasm of ascending colon: Secondary | ICD-10-CM | POA: Insufficient documentation

## 2016-10-31 DIAGNOSIS — Z87891 Personal history of nicotine dependence: Secondary | ICD-10-CM | POA: Diagnosis not present

## 2016-10-31 DIAGNOSIS — E785 Hyperlipidemia, unspecified: Secondary | ICD-10-CM | POA: Diagnosis not present

## 2016-10-31 DIAGNOSIS — Z1211 Encounter for screening for malignant neoplasm of colon: Secondary | ICD-10-CM | POA: Insufficient documentation

## 2016-10-31 DIAGNOSIS — I10 Essential (primary) hypertension: Secondary | ICD-10-CM | POA: Insufficient documentation

## 2016-10-31 DIAGNOSIS — M109 Gout, unspecified: Secondary | ICD-10-CM | POA: Insufficient documentation

## 2016-10-31 DIAGNOSIS — Z7951 Long term (current) use of inhaled steroids: Secondary | ICD-10-CM | POA: Insufficient documentation

## 2016-10-31 DIAGNOSIS — M1990 Unspecified osteoarthritis, unspecified site: Secondary | ICD-10-CM | POA: Diagnosis not present

## 2016-10-31 DIAGNOSIS — D128 Benign neoplasm of rectum: Secondary | ICD-10-CM | POA: Diagnosis not present

## 2016-10-31 DIAGNOSIS — Z79899 Other long term (current) drug therapy: Secondary | ICD-10-CM | POA: Insufficient documentation

## 2016-10-31 DIAGNOSIS — Z9841 Cataract extraction status, right eye: Secondary | ICD-10-CM | POA: Insufficient documentation

## 2016-10-31 DIAGNOSIS — Z96653 Presence of artificial knee joint, bilateral: Secondary | ICD-10-CM | POA: Insufficient documentation

## 2016-10-31 DIAGNOSIS — K5730 Diverticulosis of large intestine without perforation or abscess without bleeding: Secondary | ICD-10-CM | POA: Insufficient documentation

## 2016-10-31 DIAGNOSIS — Z8546 Personal history of malignant neoplasm of prostate: Secondary | ICD-10-CM | POA: Insufficient documentation

## 2016-10-31 DIAGNOSIS — K5790 Diverticulosis of intestine, part unspecified, without perforation or abscess without bleeding: Secondary | ICD-10-CM | POA: Diagnosis not present

## 2016-10-31 DIAGNOSIS — G4733 Obstructive sleep apnea (adult) (pediatric): Secondary | ICD-10-CM | POA: Insufficient documentation

## 2016-10-31 DIAGNOSIS — K625 Hemorrhage of anus and rectum: Secondary | ICD-10-CM | POA: Insufficient documentation

## 2016-10-31 DIAGNOSIS — K621 Rectal polyp: Secondary | ICD-10-CM | POA: Insufficient documentation

## 2016-10-31 DIAGNOSIS — Z7982 Long term (current) use of aspirin: Secondary | ICD-10-CM | POA: Insufficient documentation

## 2016-10-31 DIAGNOSIS — I2510 Atherosclerotic heart disease of native coronary artery without angina pectoris: Secondary | ICD-10-CM | POA: Insufficient documentation

## 2016-10-31 DIAGNOSIS — Z880 Allergy status to penicillin: Secondary | ICD-10-CM | POA: Insufficient documentation

## 2016-10-31 DIAGNOSIS — Z9842 Cataract extraction status, left eye: Secondary | ICD-10-CM | POA: Diagnosis not present

## 2016-10-31 DIAGNOSIS — Z9889 Other specified postprocedural states: Secondary | ICD-10-CM | POA: Insufficient documentation

## 2016-10-31 DIAGNOSIS — Z886 Allergy status to analgesic agent status: Secondary | ICD-10-CM | POA: Diagnosis not present

## 2016-10-31 DIAGNOSIS — C44612 Basal cell carcinoma of skin of right upper limb, including shoulder: Secondary | ICD-10-CM | POA: Diagnosis not present

## 2016-10-31 DIAGNOSIS — Z9049 Acquired absence of other specified parts of digestive tract: Secondary | ICD-10-CM | POA: Insufficient documentation

## 2016-10-31 DIAGNOSIS — C44519 Basal cell carcinoma of skin of other part of trunk: Secondary | ICD-10-CM | POA: Diagnosis not present

## 2016-10-31 DIAGNOSIS — Z86010 Personal history of colonic polyps: Secondary | ICD-10-CM | POA: Insufficient documentation

## 2016-10-31 DIAGNOSIS — K635 Polyp of colon: Secondary | ICD-10-CM | POA: Diagnosis not present

## 2016-10-31 DIAGNOSIS — Z961 Presence of intraocular lens: Secondary | ICD-10-CM | POA: Insufficient documentation

## 2016-10-31 DIAGNOSIS — Z85828 Personal history of other malignant neoplasm of skin: Secondary | ICD-10-CM | POA: Diagnosis not present

## 2016-10-31 DIAGNOSIS — Z951 Presence of aortocoronary bypass graft: Secondary | ICD-10-CM | POA: Insufficient documentation

## 2016-10-31 DIAGNOSIS — Z820 Family history of epilepsy and other diseases of the nervous system: Secondary | ICD-10-CM | POA: Insufficient documentation

## 2016-10-31 DIAGNOSIS — G4730 Sleep apnea, unspecified: Secondary | ICD-10-CM | POA: Insufficient documentation

## 2016-10-31 HISTORY — PX: COLONOSCOPY WITH PROPOFOL: SHX5780

## 2016-10-31 SURGERY — COLONOSCOPY WITH PROPOFOL
Anesthesia: General

## 2016-10-31 MED ORDER — PROPOFOL 500 MG/50ML IV EMUL
INTRAVENOUS | Status: DC | PRN
  Administered 2016-10-31: 100 ug/kg/min via INTRAVENOUS

## 2016-10-31 MED ORDER — PHENYLEPHRINE HCL 10 MG/ML IJ SOLN
INTRAMUSCULAR | Status: DC | PRN
  Administered 2016-10-31: 50 ug via INTRAVENOUS

## 2016-10-31 MED ORDER — PROPOFOL 500 MG/50ML IV EMUL
INTRAVENOUS | Status: AC
  Filled 2016-10-31: qty 50

## 2016-10-31 MED ORDER — FENTANYL CITRATE (PF) 100 MCG/2ML IJ SOLN
INTRAMUSCULAR | Status: DC | PRN
  Administered 2016-10-31: 50 ug via INTRAVENOUS
  Administered 2016-10-31 (×2): 25 ug via INTRAVENOUS

## 2016-10-31 MED ORDER — FENTANYL CITRATE (PF) 100 MCG/2ML IJ SOLN
INTRAMUSCULAR | Status: AC
  Filled 2016-10-31: qty 2

## 2016-10-31 MED ORDER — SODIUM CHLORIDE 0.9 % IV SOLN
INTRAVENOUS | Status: DC
  Administered 2016-10-31: 13:00:00 via INTRAVENOUS

## 2016-10-31 MED ORDER — EPHEDRINE SULFATE 50 MG/ML IJ SOLN
INTRAMUSCULAR | Status: DC | PRN
  Administered 2016-10-31: 5 mg via INTRAVENOUS
  Administered 2016-10-31: 10 mg via INTRAVENOUS
  Administered 2016-10-31 (×2): 5 mg via INTRAVENOUS

## 2016-10-31 MED ORDER — EPHEDRINE SULFATE 50 MG/ML IJ SOLN
INTRAMUSCULAR | Status: AC
  Filled 2016-10-31: qty 1

## 2016-10-31 NOTE — Anesthesia Postprocedure Evaluation (Signed)
Anesthesia Post Note  Patient: Phillip Nelson  Procedure(s) Performed: Procedure(s) (LRB): COLONOSCOPY WITH PROPOFOL (N/A)  Patient location during evaluation: Endoscopy Anesthesia Type: General Level of consciousness: awake and alert Pain management: pain level controlled Vital Signs Assessment: post-procedure vital signs reviewed and stable Respiratory status: spontaneous breathing, nonlabored ventilation, respiratory function stable and patient connected to nasal cannula oxygen Cardiovascular status: blood pressure returned to baseline and stable Postop Assessment: no signs of nausea or vomiting Anesthetic complications: no     Last Vitals:  Vitals:   10/31/16 1510 10/31/16 1520  BP: 101/61 111/65  Pulse: 86 89  Resp: 13 15  Temp: (!) 35.8 C     Last Pain:  Vitals:   10/31/16 1510  TempSrc: Tympanic                 Martha Clan

## 2016-10-31 NOTE — Interval H&P Note (Signed)
History and Physical Interval Note:  10/31/2016 12:49 PM  Phillip Nelson  has presented today for surgery, with the diagnosis of RECTAL BLEED HX COLON POLYPS  The various methods of treatment have been discussed with the patient and family. After consideration of risks, benefits and other options for treatment, the patient has consented to  Procedure(s): COLONOSCOPY WITH PROPOFOL (N/A) as a surgical intervention .  The patient's history has been reviewed, patient examined, no change in status, stable for surgery.  I have reviewed the patient's chart and labs.  Questions were answered to the patient's satisfaction.     SANKAR,SEEPLAPUTHUR G

## 2016-10-31 NOTE — H&P (View-Only) (Signed)
Patient ID: Phillip Nelson, male   DOB: Nov 12, 1934, 82 y.o.   MRN: [id number]  Chief Complaint  Patient presents with  . Rectal Bleeding    HPI Phillip Nelson is a 82 y.o. male here today for evaluation of rectal bleeding. Patient states he noticed this about a week ago. He states he had bleeding for three days, no pian. Bright red blood on the paper and in the bowel. He states in the last three days no further bleeding. He has had some similar episodes in the past. He has known diverticulosis and history of colon polyp.  Marland KitchenHPI  Past Medical History:  Diagnosis Date  . Angina at rest [facility])   . Aural vertigo   . Coronary artery disease [place]  . ED (erectile dysfunction)   . Gout, arthritis   . History of skin cancer   . Hyperlipidemia   . OSA (obstructive sleep apnea)    CPAP NON-COMPLIANT  . Prostate cancer ([place]) 05/08/12   Adenocarcinoma,gleason=3+3=6,PSA=8.26,vol=57cc    Past Surgical History:  Procedure Laterality Date  . APPENDECTOMY     Life was saved with emerg. surgery.   Marland Kitchen CARDIAC SURGERY    . CARDIOVASCULAR STRESS TEST  09-07-2011  dr Saralyn Pilar   normal perfusion/ ef 57%/ no ischemia  . CARPECTOMY Right    wrist  . CATARACT EXTRACTION W/ INTRAOCULAR LENS  IMPLANT, BILATERAL    . CORONARY ARTERY BYPASS GRAFT  JULY 2005  ([facility])   LIMA TO THE LAD  . FOOT SURGERY Bilateral    GOUT  . KNEE ARTHROSCOPY Bilateral   . LEFT ELBOW SURGERY  AGE 71  &  2007  . RADIOACTIVE SEED IMPLANT N/A 08/02/2012   Procedure: RADIOACTIVE SEED IMPLANT;  Surgeon: Claybon Jabs, MD;  Location: [facility];  Service: Urology;  Laterality: N/A;  . TOTAL KNEE ARTHROPLASTY Bilateral LEFT 2012/   RIGHT 2006  . TRANSTHORACIC ECHOCARDIOGRAM  11-24-2010   normal lvf &  lvh/ mild mr, tr, and ai    Family History  Problem Relation Age of Onset  . Alzheimer's disease Father   . Cancer Sister 57       treated surgically    Social History Social History  Substance  Use Topics  . Smoking status: Former Smoker    Packs/day: 3.00    Years: 31.00    Quit date: 08/01/1990  . Smokeless tobacco: Never Used  . Alcohol use Yes     Comment: rarely  6 beers year,    Allergies  Allergen Reactions  . Indocin [Indomethacin] Other (See Comments)    Shakes. tremors  . Penicillins Itching and Rash    Current Outpatient Prescriptions  Medication Sig Dispense Refill  . albuterol (PROVENTIL HFA;VENTOLIN HFA) 108 (90 Base) MCG/ACT inhaler Inhale 2 puffs into the lungs every 6 (six) hours as needed for wheezing or shortness of breath (cough). 1 Inhaler 0  . allopurinol (ZYLOPRIM) 300 MG tablet TAKE ONE (1) TABLET BY MOUTH EVERY DAY AS DIRECTED 30 tablet 3  . allopurinol (ZYLOPRIM) 300 MG tablet Take 1 tablet (300 mg total) by mouth daily. 30 tablet 6  . aspirin EC 81 MG tablet Take by mouth.    . cyclobenzaprine (FLEXERIL) 5 MG tablet 1 tablet every 8 hours as needed for muscle spasms 15 tablet 0  . fluticasone (FLONASE) 50 MCG/ACT nasal spray Place 2 sprays into both nostrils daily. 16 g 6  . hydrocortisone 2.5 % cream Apply 1 application topically as  needed.    Marland Kitchen ipratropium (ATROVENT) 0.03 % nasal spray Place 2 sprays into both nostrils 3 (three) times daily. 30 mL 12  . isosorbide mononitrate (IMDUR) 30 MG 24 hr tablet Take 30 mg by mouth daily.    . meclizine (ANTIVERT) 25 MG tablet Take 1 tablet (25 mg total) by mouth 3 (three) times daily. 30 tablet 6  . metoprolol succinate (TOPROL-XL) 25 MG 24 hr tablet Take 12.5 mg by mouth daily.     . naproxen sodium (ANAPROX) 220 MG tablet Take 220 mg by mouth as needed.     Marland Kitchen oxyCODONE-acetaminophen (ROXICET) 5-325 MG tablet Take 1 tablet by mouth every 4 (four) hours as needed for severe pain. 15 tablet 0  . Potassium 99 MG TABS Take by mouth.    . rosuvastatin (CRESTOR) 5 MG tablet Take 5 mg by mouth daily as needed.    . vitamin B-12 (CYANOCOBALAMIN) 100 MCG tablet Take 50 mcg by mouth daily.     . polyethylene  glycol powder (GLYCOLAX/MIRALAX) powder 255 grams one bottle for colonoscopy prep 255 g 0   No current facility-administered medications for this visit.     Review of Systems Review of Systems  Constitutional: Negative.   Respiratory: Negative.   Cardiovascular: Negative.   Gastrointestinal: Positive for anal bleeding.    Blood pressure 130/70, pulse 70, resp. rate 12, height 6' (1.829 m), weight 217 lb (98.4 kg).  Physical Exam Physical Exam  Constitutional: He is oriented to person, place, and time. He appears well-developed and well-nourished.  Eyes: Conjunctivae are normal. No scleral icterus.  Neck: Neck supple.  Cardiovascular: Normal rate, regular rhythm and normal heart sounds.   Pulmonary/Chest: Effort normal.  Abdominal: Soft. Bowel sounds are normal. There is no tenderness.  Genitourinary: Rectal exam shows no external hemorrhoid, no internal hemorrhoid, no fissure, no mass, no tenderness, anal tone normal and guaiac negative stool.  Lymphadenopathy:    He has no cervical adenopathy.  Neurological: He is alert and oriented to person, place, and time.  Skin: Skin is warm.    Data Reviewed Prior notes reviewed   Assessment   rectal bleeding-likely from minor trauma to the anterolateral rectal mucosa No apparent findings in the anorectal exam to account for his episode of bleeding. Small amount of stool obtained is heme-negative. Patient is due for surveillance colonoscopy for history of polyp.    Plan   Patient advised. He is agreeable to proceed with colonoscopy    Colonoscopy with possible biopsy/polypectomy prn: Information regarding the procedure, including its potential risks and complications (including but not limited to perforation of the bowel, which may require emergency surgery to repair, and bleeding) was verbally given to the patient. Educational information regarding lower intestinal endoscopy was given to the patient. Written instructions for how to  complete the bowel prep using Miralax were provided. The importance of drinking ample fluids to avoid dehydration as a result of the prep emphasized.  I have completed the exam and reviewed the above documentation for accuracy and completeness.  I agree with the above.  Haematologist has been used and any errors in dictation or transcription are unintentional.  Dalis Beers G. Jamal Collin, M.D., F.A.C.S.  Junie Panning G 10/27/2016, 8:44 AM  Patient has been scheduled for a colonoscopy on 10-31-16 at [facility]. Miralax prescription has been sent in to the patient's pharmacy today. Colonoscopy instructions have been reviewed with the patient. This patient is aware to call the office if they have further questions.  Dominga Ferry, CMA

## 2016-10-31 NOTE — Anesthesia Procedure Notes (Signed)
Performed by: Vaughan Sine Ventilation: Oral airway inserted - appropriate to patient size

## 2016-10-31 NOTE — Anesthesia Procedure Notes (Signed)
Performed by: COOK-MARTIN, Knight [place]elkers Pre-anesthesia Checklist: Patient identified, Emergency Drugs available, Suction available, Patient being monitored and Timeout performed Patient Re-evaluated:Patient Re-evaluated prior to induction Oxygen Delivery Method: Nasal cannula Preoxygenation: Pre-oxygenation with 100% oxygen Induction Type: IV induction Placement Confirmation: positive ETCO2 and CO2 detector       

## 2016-10-31 NOTE — Anesthesia Post-op Follow-up Note (Signed)
Anesthesia QCDR form completed.        

## 2016-10-31 NOTE — Op Note (Signed)
[facility] Gastroenterology Patient Name: Phillip Nelson Procedure Date: 10/31/2016 1:59 PM MRN: [id number] Account #: [id number] Date of Birth: 15-May-1934 Admit Type: Outpatient Age: 82 Room: [facility] ENDO ROOM 1 Gender: Male Note Status: Finalized Procedure:            Colonoscopy Indications:          High risk colon cancer surveillance: Personal history                        of colonic polyps Providers:            Esme Freund G. Jamal Collin, MD Referring MD:         Arlis Porta, MD (Referring MD) Medicines:            Total IV Anesthesia (TIVA) Complications:        No immediate complications. Procedure:            Pre-Anesthesia Assessment:                       - Using IV propofol under the supervision of an                        anesthesiologist was determined to be medically                        necessary for this procedure based on review of the                        patient's medical history, medications, and prior                        anesthesia history.                       After obtaining informed consent, the colonoscope was                        passed under direct vision. Throughout the procedure,                        the patient's blood pressure, pulse, and oxygen                        saturations were monitored continuously. The                        Colonoscope was introduced through the anus and                        advanced to the the cecum, identified by the ileocecal                        valve. The colonoscopy was performed without                        difficulty. The patient tolerated the procedure well.                        The quality of the bowel preparation was adequate. Findings:      The perianal and digital rectal examinations were normal.  Multiple small and large-mouthed diverticula were found in the sigmoid       colon and descending colon.      A 5 mm polyp was found in the rectum. The polyp was  sessile. The polyp       was removed with a hot snare. Resection and retrieval were complete.      A 3 mm polyp was found in the ascending colon. The polyp was sessile.       The polyp was removed with a cold biopsy forceps. Resection and       retrieval were complete.      The exam was otherwise without abnormality. Impression:           - Diverticulosis in the sigmoid colon and in the                        descending colon.                       - One 5 mm polyp in the rectum, removed with a hot                        snare. Resected and retrieved.                       - One 3 mm polyp in the ascending colon, removed with a                        cold biopsy forceps. Resected and retrieved.                       - The examination was otherwise normal. Recommendation:       - Discharge patient to home.                       - Resume previous diet.                       - Continue present medications.                       - Await pathology results.                       - Repeat colonoscopy in 5 years for surveillance. Procedure Code(s):    --- Professional ---                       [phone], Colonoscopy, flexible; with removal of tumor(s),                        polyp(s), or other lesion(s) by snare technique                       45380, 89, Colonoscopy, flexible; with biopsy, single                        or multiple Diagnosis Code(s):    --- Professional ---                       Z86.010, Personal history of colonic polyps  K62.1, Rectal polyp                       D12.2, Benign neoplasm of ascending colon                       K57.30, Diverticulosis of large intestine without                        perforation or abscess without bleeding CPT copyright 2016 American Medical Association. All rights reserved. The codes documented in this report are preliminary and upon coder review may  be revised to meet current compliance requirements. Christene Lye,  MD 10/31/2016 3:14:25 PM This report has been signed electronically. Number of Addenda: 0 Note Initiated On: 10/31/2016 1:59 PM Scope Withdrawal Time: 0 hours 18 minutes 31 seconds  Total Procedure Duration: 1 hour 0 minutes 48 seconds       [facility]

## 2016-10-31 NOTE — Transfer of Care (Signed)
Immediate Anesthesia Transfer of Care Note  Patient: Phillip Nelson  Procedure(s) Performed: Procedure(s): COLONOSCOPY WITH PROPOFOL (N/A)  Patient Location: PACU  Anesthesia Type:General  Level of Consciousness: awake and sedated  Airway & Oxygen Therapy: Patient Spontanous Breathing and Patient connected to nasal cannula oxygen  Post-op Assessment: Report given to RN and Post -op Vital signs reviewed and stable  Post vital signs: Reviewed and stable  Last Vitals:  Vitals:   10/31/16 1255 10/31/16 1510  BP: 122/87 101/61  Pulse: 91 86  Resp: 20 13  Temp: 36.7 C (!) 35.8 C    Last Pain:  Vitals:   10/31/16 1510  TempSrc: Tympanic      Patients Stated Pain Goal: 0 (45/62/56 3893)  Complications: No apparent anesthesia complications

## 2016-10-31 NOTE — Transfer of Care (Signed)
Immediate Anesthesia Transfer of Care Note  Patient: Phillip Nelson  Procedure(s) Performed: Procedure(s): COLONOSCOPY WITH PROPOFOL (N/A)  Patient Location: PACU  Anesthesia Type:General  Level of Consciousness: awake and sedated  Airway & Oxygen Therapy: Patient Spontanous Breathing and Patient connected to nasal cannula oxygen  Post-op Assessment: Report given to RN and Post -op Vital signs reviewed and stable  Post vital signs: Reviewed and stable  Last Vitals:  Vitals:   10/31/16 1255 10/31/16 1510  BP: 122/87 101/61  Pulse: 91 86  Resp: 20 13  Temp: 36.7 C (!) 35.8 C    Last Pain:  Vitals:   10/31/16 1510  TempSrc: Tympanic      Patients Stated Pain Goal: 0 (03/70/96 4383)  Complications: No apparent anesthesia complications

## 2016-10-31 NOTE — Anesthesia Preprocedure Evaluation (Signed)
Anesthesia Evaluation  Patient identified by MRN, date of birth, ID band Patient awake    Reviewed: Allergy & Precautions, H&P , NPO status , Patient's Chart, lab work & pertinent test results, reviewed documented beta blocker date and time   Airway Mallampati: III  TM Distance: >3 FB Neck ROM: Full    Dental  (+) Teeth Intact, Partial Upper, Dental Advisory Given   Pulmonary sleep apnea (Noncompliant with CPAP) and Continuous Positive Airway Pressure Ventilation , former smoker,    Pulmonary exam normal        Cardiovascular hypertension, Pt. on medications and Pt. on home beta blockers + angina with exertion + CAD and + CABG  Normal cardiovascular exam Rhythm:Regular Rate:Normal     Neuro/Psych negative neurological ROS  negative psych ROS   GI/Hepatic negative GI ROS, Neg liver ROS,   Endo/Other  negative endocrine ROS  Renal/GU negative Renal ROS  negative genitourinary   Musculoskeletal negative musculoskeletal ROS (+) Arthritis , Osteoarthritis,    Abdominal   Peds negative pediatric ROS (+)  Hematology negative hematology ROS (+)   Anesthesia Other Findings Past Medical History: No date: Angina at rest [facility]) No date: Aural vertigo [place]: Coronary artery disease No date: ED (erectile dysfunction) No date: Gout, arthritis No date: History of skin cancer No date: Hyperlipidemia No date: OSA (obstructive sleep apnea)     Comment:  CPAP NON-COMPLIANT 05/08/12: Prostate cancer ([place])     Comment:  Adenocarcinoma,gleason=3+3=6,PSA=8.26,vol=57cc  Reproductive/Obstetrics                             Anesthesia Physical  Anesthesia Plan  ASA: III  Anesthesia Plan: General   Post-op Pain Management:    Induction: Intravenous  PONV Risk Score and Plan:   Airway Management Planned: Nasal Cannula  Additional Equipment:   Intra-op Plan:   Post-operative Plan:    Informed Consent: I have reviewed the patients History and Physical, chart, labs and discussed the procedure including the risks, benefits and alternatives for the proposed anesthesia with the patient or authorized representative who has indicated his/her understanding and acceptance.   Dental advisory given  Plan Discussed with: CRNA  Anesthesia Plan Comments:         Anesthesia Quick Evaluation

## 2016-11-01 ENCOUNTER — Encounter: Payer: Self-pay | Admitting: General Surgery

## 2016-11-02 LAB — SURGICAL PATHOLOGY

## 2016-12-25 DIAGNOSIS — C61 Malignant neoplasm of prostate: Secondary | ICD-10-CM | POA: Diagnosis not present

## 2016-12-29 DIAGNOSIS — Z8546 Personal history of malignant neoplasm of prostate: Secondary | ICD-10-CM | POA: Diagnosis not present

## 2017-01-10 ENCOUNTER — Ambulatory Visit (INDEPENDENT_AMBULATORY_CARE_PROVIDER_SITE_OTHER): Payer: Medicare Other

## 2017-01-10 DIAGNOSIS — Z23 Encounter for immunization: Secondary | ICD-10-CM | POA: Diagnosis not present

## 2017-03-15 DIAGNOSIS — M19031 Primary osteoarthritis, right wrist: Secondary | ICD-10-CM | POA: Diagnosis not present

## 2017-03-15 DIAGNOSIS — M19032 Primary osteoarthritis, left wrist: Secondary | ICD-10-CM | POA: Diagnosis not present

## 2017-03-15 DIAGNOSIS — G5602 Carpal tunnel syndrome, left upper limb: Secondary | ICD-10-CM | POA: Diagnosis not present

## 2017-03-15 DIAGNOSIS — M25532 Pain in left wrist: Secondary | ICD-10-CM | POA: Diagnosis not present

## 2017-04-11 DIAGNOSIS — M1A00X1 Idiopathic chronic gout, unspecified site, with tophus (tophi): Secondary | ICD-10-CM | POA: Diagnosis not present

## 2017-04-11 DIAGNOSIS — M19041 Primary osteoarthritis, right hand: Secondary | ICD-10-CM | POA: Diagnosis not present

## 2017-04-11 DIAGNOSIS — M19031 Primary osteoarthritis, right wrist: Secondary | ICD-10-CM | POA: Diagnosis not present

## 2017-04-11 DIAGNOSIS — R200 Anesthesia of skin: Secondary | ICD-10-CM | POA: Diagnosis not present

## 2017-04-11 DIAGNOSIS — R202 Paresthesia of skin: Secondary | ICD-10-CM | POA: Diagnosis not present

## 2017-04-11 DIAGNOSIS — M19032 Primary osteoarthritis, left wrist: Secondary | ICD-10-CM | POA: Diagnosis not present

## 2017-04-16 DIAGNOSIS — G629 Polyneuropathy, unspecified: Secondary | ICD-10-CM | POA: Diagnosis not present

## 2017-04-26 DIAGNOSIS — G629 Polyneuropathy, unspecified: Secondary | ICD-10-CM | POA: Diagnosis not present

## 2017-04-26 DIAGNOSIS — M1A00X1 Idiopathic chronic gout, unspecified site, with tophus (tophi): Secondary | ICD-10-CM | POA: Diagnosis not present

## 2017-04-26 DIAGNOSIS — M19031 Primary osteoarthritis, right wrist: Secondary | ICD-10-CM | POA: Diagnosis not present

## 2017-04-26 DIAGNOSIS — M19032 Primary osteoarthritis, left wrist: Secondary | ICD-10-CM | POA: Diagnosis not present

## 2017-05-11 ENCOUNTER — Emergency Department: Payer: Medicare Other

## 2017-05-11 ENCOUNTER — Emergency Department
Admission: EM | Admit: 2017-05-11 | Discharge: 2017-05-11 | Disposition: A | Discharge status: Home / Self Care | Payer: Medicare Other | Attending: Student in an Organized Health Care Education/Training Program | Admitting: Student in an Organized Health Care Education/Training Program

## 2017-05-11 DIAGNOSIS — I2510 Atherosclerotic heart disease of native coronary artery without angina pectoris: Secondary | ICD-10-CM | POA: Diagnosis not present

## 2017-05-11 DIAGNOSIS — I10 Essential (primary) hypertension: Secondary | ICD-10-CM | POA: Diagnosis not present

## 2017-05-11 DIAGNOSIS — R55 Syncope and collapse: Secondary | ICD-10-CM | POA: Insufficient documentation

## 2017-05-11 DIAGNOSIS — Z87891 Personal history of nicotine dependence: Secondary | ICD-10-CM | POA: Insufficient documentation

## 2017-05-11 DIAGNOSIS — Z8546 Personal history of malignant neoplasm of prostate: Secondary | ICD-10-CM | POA: Diagnosis not present

## 2017-05-11 DIAGNOSIS — Z85828 Personal history of other malignant neoplasm of skin: Secondary | ICD-10-CM | POA: Diagnosis not present

## 2017-05-11 DIAGNOSIS — R42 Dizziness and giddiness: Secondary | ICD-10-CM | POA: Insufficient documentation

## 2017-05-11 DIAGNOSIS — M6281 Muscle weakness (generalized): Secondary | ICD-10-CM | POA: Diagnosis not present

## 2017-05-11 DIAGNOSIS — Z79899 Other long term (current) drug therapy: Secondary | ICD-10-CM | POA: Diagnosis not present

## 2017-05-11 DIAGNOSIS — Z7982 Long term (current) use of aspirin: Secondary | ICD-10-CM | POA: Insufficient documentation

## 2017-05-11 DIAGNOSIS — Z951 Presence of aortocoronary bypass graft: Secondary | ICD-10-CM | POA: Diagnosis not present

## 2017-05-11 DIAGNOSIS — Z96651 Presence of right artificial knee joint: Secondary | ICD-10-CM | POA: Insufficient documentation

## 2017-05-11 DIAGNOSIS — Z96652 Presence of left artificial knee joint: Secondary | ICD-10-CM | POA: Diagnosis not present

## 2017-05-11 DIAGNOSIS — R531 Weakness: Secondary | ICD-10-CM | POA: Insufficient documentation

## 2017-05-11 LAB — BASIC METABOLIC PANEL
Anion gap: 7 (ref 5–15)
BUN: 16 mg/dL (ref 6–20)
CO2: 23 mmol/L (ref 22–32)
Calcium: 8.8 mg/dL — ABNORMAL LOW (ref 8.9–10.3)
Chloride: 107 mmol/L (ref 101–111)
Creatinine, Ser: 0.88 mg/dL (ref 0.61–1.24)
GFR calc Af Amer: >60 mL/min (ref >60)
GFR calc non Af Amer: >60 mL/min (ref >60)
Glucose, Bld: 114 mg/dL — ABNORMAL HIGH (ref 65–99)
Potassium: 3.3 mmol/L — ABNORMAL LOW (ref 3.5–5.1)
Sodium: 137 mmol/L (ref 135–145)

## 2017-05-11 LAB — PROTIME-INR
INR: 0.96
Prothrombin Time: 12.7 seconds (ref 11.4–15.2)

## 2017-05-11 LAB — URINE DRUG SCREEN, QUALITATIVE (ARMC ONLY)
Amphetamines, Ur Screen: NOT DETECTED
Barbiturates, Ur Screen: NOT DETECTED
Benzodiazepine, Ur Scrn: NOT DETECTED
Cannabinoid 50 Ng, Ur ~~LOC~~: NOT DETECTED
Cocaine Metabolite,Ur ~~LOC~~: NOT DETECTED
MDMA (Ecstasy)Ur Screen: NOT DETECTED
Methadone Scn, Ur: NOT DETECTED
Opiate, Ur Screen: NOT DETECTED
Phencyclidine (PCP) Ur S: NOT DETECTED
Tricyclic, Ur Screen: NOT DETECTED

## 2017-05-11 LAB — URINALYSIS, ROUTINE W REFLEX MICROSCOPIC
Bacteria, UA: NONE SEEN
Bilirubin Urine: NEGATIVE
Glucose, UA: NEGATIVE mg/dL
Hgb urine dipstick: NEGATIVE
Ketones, ur: NEGATIVE mg/dL
Leukocytes, UA: NEGATIVE
Nitrite: NEGATIVE
Protein, ur: NEGATIVE mg/dL
Specific Gravity, Urine: 1.013 (ref 1.005–1.030)
pH: 8 (ref 5.0–8.0)

## 2017-05-11 LAB — CBC
HCT: 44.1 % (ref 40.0–52.0)
Hemoglobin: 14.8 g/dL (ref 13.0–18.0)
MCH: 32.6 pg (ref 26.0–34.0)
MCHC: 33.7 g/dL (ref 32.0–36.0)
MCV: 96.9 fL (ref 80.0–100.0)
Platelets: 242 10*3/uL (ref 150–440)
RBC: 4.56 MIL/uL (ref 4.40–5.90)
RDW: 13.1 % (ref 11.5–14.5)
WBC: 10.5 10*3/uL (ref 3.8–10.6)

## 2017-05-11 LAB — APTT: aPTT: 29 seconds (ref 24–36)

## 2017-05-11 LAB — ETHANOL: Alcohol, Ethyl (B): <10 mg/dL (ref <10)

## 2017-05-11 LAB — TROPONIN I: Troponin I: <0.03 ng/mL (ref <0.03)

## 2017-05-11 MED ORDER — MECLIZINE HCL 25 MG PO TABS
12.5000 mg | ORAL_TABLET | Freq: Once | ORAL | Status: AC
  Administered 2017-05-11: 12.5 mg via ORAL
  Filled 2017-05-11: qty 1

## 2017-05-11 MED ORDER — MECLIZINE HCL 12.5 MG PO TABS: 12.5000 mg | ORAL_TABLET | Freq: Three times a day (TID) | ORAL | 0 refills | Status: DC | PRN

## 2017-05-11 NOTE — ED Triage Notes (Signed)
Pt brought in by [facility] from home.  Pt states that he was laying down to take a nap at 3pm and had a "falling" sensation.  Pt states that he then became very dizzy and weak, which is worse than his normal vertigo.  Pt is A&Ox4, in NAD.

## 2017-05-11 NOTE — ED Notes (Signed)
Pt discharged to home.  Family member driving.  Discharge instructions reviewed.  Verbalized understanding.  No questions or concerns at this time.  Teach back verified.  Pt in NAD.  No items left in ED.   

## 2017-05-11 NOTE — ED Provider Notes (Signed)
[facility] Emergency Department Provider Note    First MD Initiated Contact with Patient 05/11/17 1626     (approximate)  I have reviewed the triage vital signs and the nursing notes.   HISTORY  Chief Complaint Near Syncope    HPI Phillip Nelson is a 83 y.o. male with a history of vertigo as well as CAD who presents with chief complaint of dizziness that occurred when he was laying down to take a nap.  States that he felt that the room was spinning around violently.  It subsequently stopped.  2 minutes later it reoccurred and he still having some dizziness.  Does feel primary lightheaded and that his head is detached from his body.  Or tingling.  No blurry vision.  No headache.  No neck pain.  Past Medical History:  Diagnosis Date  . Angina at rest [facility])   . Aural vertigo   . Coronary artery disease [place]  . ED (erectile dysfunction)   . Gout, arthritis   . History of skin cancer   . Hyperlipidemia   . OSA (obstructive sleep apnea)    CPAP NON-COMPLIANT  . Prostate cancer ([place]) 05/08/12   Adenocarcinoma,gleason=3+3=6,PSA=8.26,vol=57cc   Family History  Problem Relation Age of Onset  . Alzheimer's disease Father   . Cancer Sister 61       treated surgically   Past Surgical History:  Procedure Laterality Date  . APPENDECTOMY     Life was saved with emerg. surgery.   Marland Kitchen CARDIAC SURGERY    . CARDIOVASCULAR STRESS TEST  09-07-2011  dr Saralyn Pilar   normal perfusion/ ef 57%/ no ischemia  . CARPECTOMY Right    wrist  . CATARACT EXTRACTION W/ INTRAOCULAR LENS  IMPLANT, BILATERAL    . COLONOSCOPY WITH PROPOFOL N/A 10/31/2016   Procedure: COLONOSCOPY WITH PROPOFOL;  Surgeon: Christene Lye, MD;  Location: [facility] ENDOSCOPY;  Service: Endoscopy;  Laterality: N/A;  . CORONARY ARTERY BYPASS GRAFT  JULY 2005  ([facility])   LIMA TO THE LAD  . FOOT SURGERY Bilateral    GOUT  . KNEE ARTHROSCOPY Bilateral   . LEFT ELBOW SURGERY  AGE 57  &  2007    . RADIOACTIVE SEED IMPLANT N/A 08/02/2012   Procedure: RADIOACTIVE SEED IMPLANT;  Surgeon: Claybon Jabs, MD;  Location: [facility];  Service: Urology;  Laterality: N/A;  . TOTAL KNEE ARTHROPLASTY Bilateral LEFT 2012/   RIGHT 2006  . TRANSTHORACIC ECHOCARDIOGRAM  11-24-2010   normal lvf &  lvh/ mild mr, tr, and ai   Patient Active Problem List   Diagnosis Date Noted  . Stable angina pectoris ([place]) 06/22/2016  . Sleep apnea 04/29/2015  . Colon polyps 03/02/2015  . Rectal bleeding 03/02/2015  . Benign essential HTN 07/20/2014  . CAD (coronary artery disease) 07/20/2014  . Chest pain 07/20/2014  . Hyperlipemia, mixed 07/20/2014  . OSA (obstructive sleep apnea) 07/20/2014  . Contusion of knee 09/16/2013  . Prostate cancer ([place]) 05/08/2012      Prior to Admission medications   Medication Sig Start Date End Date Taking? Authorizing Provider  albuterol (PROVENTIL HFA;VENTOLIN HFA) 108 (90 Base) MCG/ACT inhaler Inhale 2 puffs into the lungs every 6 (six) hours as needed for wheezing or shortness of breath (cough). 01/11/16   Karamalegos, Devonne Doughty, DO  allopurinol (ZYLOPRIM) 300 MG tablet TAKE ONE (1) TABLET BY MOUTH EVERY DAY AS DIRECTED 12/16/15   Arlis Porta., MD  allopurinol (ZYLOPRIM) 300 MG  tablet Take 1 tablet (300 mg total) by mouth daily. 08/08/16   Mikey College, NP  aspirin EC 81 MG tablet Take by mouth.    [provider]  cyclobenzaprine (FLEXERIL) 5 MG tablet 1 tablet every 8 hours as needed for muscle spasms 08/26/16   Paulette Blanch, MD  fluticasone [facility]) 50 MCG/ACT nasal spray Place 2 sprays into both nostrils daily. 07/12/16   Mikey College, NP  hydrocortisone 2.5 % cream Apply 1 application topically as needed. 09/07/15   [provider]  ipratropium (ATROVENT) 0.03 % nasal spray Place 2 sprays into both nostrils 3 (three) times daily. 08/11/16   Mikey College, NP  isosorbide mononitrate (IMDUR) 30 MG 24 hr  tablet Take 30 mg by mouth daily. 06/09/16 06/09/17  [provider]  meclizine (ANTIVERT) 12.5 MG tablet Take 1 tablet (12.5 mg total) by mouth 3 (three) times daily as needed for dizziness. 05/11/17   Merlyn Lot, MD  meclizine (ANTIVERT) 25 MG tablet Take 1 tablet (25 mg total) by mouth 3 (three) times daily. 03/02/15   Arlis Porta., MD  metoprolol succinate (TOPROL-XL) 25 MG 24 hr tablet Take 12.5 mg by mouth daily.  06/09/16   [provider]  naproxen sodium (ANAPROX) 220 MG tablet Take 220 mg by mouth as needed.     [provider]  oxyCODONE-acetaminophen (ROXICET) 5-325 MG tablet Take 1 tablet by mouth every 4 (four) hours as needed for severe pain. 08/26/16   Paulette Blanch, MD  polyethylene glycol powder (GLYCOLAX/MIRALAX) powder 255 grams one bottle for colonoscopy prep 10/26/16   Christene Lye, MD  Potassium 99 MG TABS Take by mouth.    [provider]  rosuvastatin (CRESTOR) 5 MG tablet Take 5 mg by mouth daily as needed. 06/09/16 06/09/17  [provider]  vitamin B-12 (CYANOCOBALAMIN) 100 MCG tablet Take 50 mcg by mouth daily.     [provider]    Allergies Indocin [indomethacin] and Penicillins    Social History Social History   Tobacco Use  . Smoking status: Former Smoker    Packs/day: 3.00    Years: 31.00    Pack years: 93.00    Last attempt to quit: 08/01/1990    Years since quitting: 26.7  . Smokeless tobacco: Never Used  Substance Use Topics  . Alcohol use: No    Comment: rarely  6 beers year,  . Drug use: No    Comment: quit 56moking 25 years ago    Review of Systems Patient denies headaches, rhinorrhea, blurry vision, numbness, shortness of breath, chest pain, edema, cough, abdominal pain, nausea, vomiting, diarrhea, dysuria, fevers, rashes or hallucinations unless otherwise stated above in HPI. ____________________________________________   PHYSICAL EXAM:  VITAL SIGNS: Vitals:    05/11/17 1633 05/11/17 2130  BP: (!) 120/104 (!) 145/79  Pulse: 80 83  Resp: (!) 22 10  Temp: (!) 97.3 F (36.3 C)   SpO2: 99% 94%    Constitutional: Alert and oriented. Well appearing and in no acute distress. Eyes: Conjunctivae are normal.  Head: Atraumatic. Nose: No congestion/rhinnorhea. Mouth/Throat: Mucous membranes are moist.   Neck: No stridor. Painless ROM.  Cardiovascular: Normal rate, regular rhythm. Grossly normal heart sounds.  Good peripheral circulation. Respiratory: Normal respiratory effort.  No retractions. Lungs CTAB. Gastrointestinal: Soft and nontender. No distention. No abdominal bruits. No CVA tenderness. Genitourinary:  Musculoskeletal: No lower extremity tenderness nor edema.  No joint effusions. Neurologic:  CN- intact.  No  facial droop, Normal FNF.  Normal heel to shin.  Sensation intact bilaterally. Normal speech and language. No gross focal neurologic deficits are appreciated. No gait instability. Skin:  Skin is warm, dry and intact. No rash noted. Psychiatric: Mood and affect are normal. Speech and behavior are normal.  ____________________________________________   LABS (all labs ordered are listed, but only abnormal results are displayed)  Results for orders placed or performed during the hospital encounter of 05/11/17 (from the past 24 hour(s))  Basic metabolic panel     Status: Abnormal   Collection Time: 05/11/17  4:33 PM  Result Value Ref Range   Sodium 137 135 - 145 mmol/L   Potassium 3.3 (L) 3.5 - 5.1 mmol/L   Chloride 107 101 - 111 mmol/L   CO2 23 22 - 32 mmol/L   Glucose, Bld 114 (H) 65 - 99 mg/dL   BUN 16 6 - 20 mg/dL   Creatinine, Ser 0.88 0.61 - 1.24 mg/dL   Calcium 8.8 (L) 8.9 - 10.3 mg/dL   GFR calc non Af Amer >60 >60 mL/min   GFR calc Af Amer >60 >60 mL/min   Anion gap 7 5 - 15  CBC     Status: None   Collection Time: 05/11/17  4:33 PM  Result Value Ref Range   WBC 10.5 3.8 - 10.6 K/uL   RBC 4.56 4.40 - 5.90 MIL/uL    Hemoglobin 14.8 13.0 - 18.0 g/dL   HCT 44.1 40.0 - 52.0 %   MCV 96.9 80.0 - 100.0 fL   MCH 32.6 26.0 - 34.0 pg   MCHC 33.7 32.0 - 36.0 g/dL   RDW 13.1 11.5 - 14.5 %   Platelets 242 150 - 440 K/uL  Ethanol     Status: None   Collection Time: 05/11/17  4:33 PM  Result Value Ref Range   Alcohol, Ethyl (B) <10 <10 mg/dL  Protime-INR     Status: None   Collection Time: 05/11/17  4:33 PM  Result Value Ref Range   Prothrombin Time 12.7 11.4 - 15.2 seconds   INR 0.96   APTT     Status: None   Collection Time: 05/11/17  4:33 PM  Result Value Ref Range   aPTT 29 24 - 36 seconds  Troponin I     Status: None   Collection Time: 05/11/17  4:33 PM  Result Value Ref Range   Troponin I <0.03 <0.03 ng/mL  Urine Drug Screen, Qualitative     Status: None   Collection Time: 05/11/17  4:45 PM  Result Value Ref Range   Tricyclic, Ur Screen NONE DETECTED NONE DETECTED   Amphetamines, Ur Screen NONE DETECTED NONE DETECTED   MDMA (Ecstasy)Ur Screen NONE DETECTED NONE DETECTED   Cocaine Metabolite,Ur [place] NONE DETECTED NONE DETECTED   Opiate, Ur Screen NONE DETECTED NONE DETECTED   Phencyclidine (PCP) Ur S NONE DETECTED NONE DETECTED   Cannabinoid 50 Ng, Ur [place] NONE DETECTED NONE DETECTED   Barbiturates, Ur Screen NONE DETECTED NONE DETECTED   Benzodiazepine, Ur Scrn NONE DETECTED NONE DETECTED   Methadone Scn, Ur NONE DETECTED NONE DETECTED  Urinalysis, Routine w reflex microscopic     Status: Abnormal   Collection Time: 05/11/17  4:45 PM  Result Value Ref Range   Color, Urine YELLOW (A) YELLOW   APPearance CLEAR (A) CLEAR   Specific Gravity, Urine 1.013 1.005 - 1.030   pH 8.0 5.0 - 8.0   Glucose, UA NEGATIVE NEGATIVE mg/dL   Hgb urine  dipstick NEGATIVE NEGATIVE   Bilirubin Urine NEGATIVE NEGATIVE   Ketones, ur NEGATIVE NEGATIVE mg/dL   Protein, ur NEGATIVE NEGATIVE mg/dL   Nitrite NEGATIVE NEGATIVE   Leukocytes, UA NEGATIVE NEGATIVE   RBC / HPF 0-5 0 - 5 RBC/hpf   WBC, UA 0-5 0 - 5 WBC/hpf    Bacteria, UA NONE SEEN NONE SEEN   Squamous Epithelial / LPF 0-5 (A) NONE SEEN   ____________________________________________  EKG My review and personal interpretation at Time: 16:29   Indication: dizziness  Rate: 80  Rhythm: sinus Axis: normal Other: first degree block, rbbb, no stemi ____________________________________________  RADIOLOGY  I personally reviewed all radiographic images ordered to evaluate for the above acute complaints and reviewed radiology reports and findings.  These findings were personally discussed with the patient.  Please see medical record for radiology report.  ____________________________________________   PROCEDURES  Procedure(s) performed:  Procedures    Critical Care performed: no ____________________________________________   INITIAL IMPRESSION / ASSESSMENT AND PLAN / ED COURSE  Pertinent labs & imaging results that were available during my care of the patient were reviewed by me and considered in my medical decision making (see chart for details).  DDX: bppv, cva, tia, hypoglycemia, dehydration, electrolyte abnormality, dissection, sepsis   Phillip Nelson is a 83 y.o. who presents to the ED with symptoms as described above.  Patient well-appearing and in no acute distress.  Slightly difficult to differentiate between central versus peripheral vertigo given his history of vertigo but nonfocal neuro exam and no inducible nystagmus.  CT imaging ordered to evaluate for evidence of mass or subdural shows no evidence of bleed or acute abnormality.  Blood work is reassuring.  Given his history of vertigo will give dose of meclizine as well as order MRI to evaluate for CVA and posterior circulation process.  Does not seem clinically consistent with dysrhythmia, ACS or infectious process.  Clinical Course as of May 12 10  Fri May 11, 2017  2132 Patient reassessed.  MRI is reassuring patient states symptoms have resolved.  Able to ambulate with  a steady gait.  This point do believe patient stable and appropriate for outpatient follow-up.  [PR]    Clinical Course User Index [PR] Merlyn Lot, MD     ____________________________________________   FINAL CLINICAL IMPRESSION(S) / ED DIAGNOSES  Final diagnoses:  Dizziness      NEW MEDICATIONS STARTED DURING THIS VISIT:  Discharge Medication List as of 05/11/2017  9:34 PM       Note:  This document was prepared using Dragon voice recognition software and may include unintentional dictation errors.    Merlyn Lot, MD 05/12/17 [phone]

## 2017-05-11 NOTE — Discharge Instructions (Signed)
Return to the ER for any chest pain palpitations numbness or tingling or for any additional questions or concerns.

## 2017-05-29 DIAGNOSIS — M25512 Pain in left shoulder: Secondary | ICD-10-CM | POA: Diagnosis not present

## 2017-06-14 ENCOUNTER — Other Ambulatory Visit: Payer: Self-pay | Admitting: Orthopedic Surgery

## 2017-06-14 DIAGNOSIS — S66212A Strain of extensor muscle, fascia and tendon of left thumb at wrist and hand level, initial encounter: Secondary | ICD-10-CM | POA: Diagnosis not present

## 2017-06-24 NOTE — H&P (Signed)
Phillip Nelson is an 83 y.o. male.   CC / Reason for Visit: Bilateral hand burning; left thumb tendon pain HPI: This patient returns for reevaluation, and I have received and reviewed his NCS/EMG from 04-16-17, performed at the neurology department at [facility] by Dr. Gurney Maxin.  The conclusion is 1 of a chronic severe generalized polyneuropathy without upper extremity compressive neuropathy.  Patient reports continued neuropathic symptoms in both his feet and his hands, as well as continued EPL instability.  He is not on any anticoagulation, and does have a C pap at home.  HPI 05-29-17: This patient is an 83 year old, left-hand-dominant, male who indicates that he has been having burning sensations into bilateral upper extremities as well as a snapping, popping sensation in his left wrist for quite some time, at least 6 months.  He indicates that he did see Dr. Meda Coffee at [facility].  The patient states that [facility] conduction studies were ordered for his symptoms, but he is unaware of what the results were and we do not have access to the studies today.  Patient indicates that he also had x-rays at that time, which we do not have access to as well.  He presents today with some degree of dissatisfaction with his prior care. He also was hopeful that he could feel better enough to participate in golf.   Past Medical History:  Diagnosis Date  . Angina at rest [facility])   . Aural vertigo   . Coronary artery disease [place]  . ED (erectile dysfunction)   . Gout, arthritis   . History of skin cancer   . Hyperlipidemia   . OSA (obstructive sleep apnea)    CPAP NON-COMPLIANT  . Prostate cancer ([place]) 05/08/12   Adenocarcinoma,gleason=3+3=6,PSA=8.26,vol=57cc    Past Surgical History:  Procedure Laterality Date  . APPENDECTOMY     Life was saved with emerg. surgery.   Marland Kitchen CARDIAC SURGERY    . CARDIOVASCULAR STRESS TEST  09-07-2011  dr Saralyn Pilar   normal perfusion/ ef 57%/ no  ischemia  . CARPECTOMY Right    wrist  . CATARACT EXTRACTION W/ INTRAOCULAR LENS  IMPLANT, BILATERAL    . COLONOSCOPY WITH PROPOFOL N/A 10/31/2016   Procedure: COLONOSCOPY WITH PROPOFOL;  Surgeon: Christene Lye, MD;  Location: [facility] ENDOSCOPY;  Service: Endoscopy;  Laterality: N/A;  . CORONARY ARTERY BYPASS GRAFT  JULY 2005  ([facility])   LIMA TO THE LAD  . FOOT SURGERY Bilateral    GOUT  . KNEE ARTHROSCOPY Bilateral   . LEFT ELBOW SURGERY  AGE 40  &  2007  . RADIOACTIVE SEED IMPLANT N/A 08/02/2012   Procedure: RADIOACTIVE SEED IMPLANT;  Surgeon: Claybon Jabs, MD;  Location: [facility];  Service: Urology;  Laterality: N/A;  . TOTAL KNEE ARTHROPLASTY Bilateral LEFT 2012/   RIGHT 2006  . TRANSTHORACIC ECHOCARDIOGRAM  11-24-2010   normal lvf &  lvh/ mild mr, tr, and ai    Family History  Problem Relation Age of Onset  . Alzheimer's disease Father   . Cancer Sister 86       treated surgically   Social History:  reports that he quit smoking about 26 years ago. He has a 93.00 pack-year smoking history. He has never used smokeless tobacco. He reports that he does not drink alcohol or use drugs.  Allergies:  Allergies  Allergen Reactions  . Indocin [Indomethacin] Other (See Comments)    Shakes. tremors  . Penicillins Itching and Rash  No medications prior to admission.    No results found for this or any previous visit (from the past 48 hour(s)). No results found.  Review of Systems  All other systems reviewed and are negative.   There were no vitals taken for this visit. Physical Exam  Constitutional:  WD, WN, NAD HEENT:  NCAT, EOMI Neuro/Psych:  Alert & oriented to person, place, and time; appropriate mood & affect Lymphatic: No generalized UE edema or lymphadenopathy Extremities / MSK:  Both UE are normal with respect to appearance, ranges of motion, joint stability, muscle strength/tone, sensation, & perfusion except as otherwise noted:  This  patient has multiple small joint osteoarthritic changes bilaterally.  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  On the left side, the EPL visually and palpably triggers, perhaps subluxating back and forth over Lister's tubercle versus other tophaceaous deposits.  Palpating the tendon is not painful although you can feel some crepitus with its motion.  Digital range of motion index through small finger is limited due to small joint arthritis.  Light touch sensibility is intact, although somewhat altered on both the volar and dorsal surfaces specifically in the left hand.   Labs / Xrays:  None today.  X-rays from 05-29-17: 3 views of the left wrist ordered and obtained today demonstrate pretty significant arthritic changes in the wrist as well as multiple small joints throughout the hand.  In addition, there is some calcium deposition in the dorsal soft tissues of the distal forearm, best appreciated on the lateral.  Assessment: 1.  Probable EPL instability 2.  Bilateral hand burning over diffuse areas-etilogy likely generalized polyneuropathy  Plan:  The findings are discussed with the patient.  I indicated that it is unlikely there is any surgical intervention that could be helpful for his neuropathic symptoms.  However, his EPL symptoms I think he can be helped, but not without surgery.  I discussed with him a plan that included exploration, likely under the field block with sedation, with either release of the EPL third compartment with transposition of the tendon, or repair or stabilization of the compartment.  The details of the operative procedure were discussed with the patient.  Questions were invited and answered.  In addition to the goal of the procedure, the risks of the procedure to include but not limited to bleeding; infection; damage to the nerves or blood vessels that could result in bleeding, numbness, weakness, chronic pain, and  the need for additional procedures; stiffness; the need for revision surgery; and anesthetic risks were reviewed.  No specific outcome was guaranteed or implied.  Informed consent was obtained.  Jolyn Nap, MD 06/24/2017, 9:42 PM

## 2017-06-25 ENCOUNTER — Ambulatory Visit (HOSPITAL_BASED_OUTPATIENT_CLINIC_OR_DEPARTMENT_OTHER): Payer: Medicare Other | Admitting: Certified Registered"

## 2017-06-25 ENCOUNTER — Encounter (HOSPITAL_BASED_OUTPATIENT_CLINIC_OR_DEPARTMENT_OTHER): Payer: Self-pay | Admitting: Certified Registered"

## 2017-06-25 ENCOUNTER — Encounter (HOSPITAL_BASED_OUTPATIENT_CLINIC_OR_DEPARTMENT_OTHER)
Admission: RE | Disposition: A | Discharge status: Home / Self Care | Payer: Self-pay | Source: Ambulatory Visit | Attending: Orthopedic Surgery

## 2017-06-25 ENCOUNTER — Other Ambulatory Visit: Payer: Self-pay

## 2017-06-25 ENCOUNTER — Ambulatory Visit (HOSPITAL_BASED_OUTPATIENT_CLINIC_OR_DEPARTMENT_OTHER)
Admission: RE | Admit: 2017-06-25 | Discharge: 2017-06-25 | Disposition: A | Discharge status: Home / Self Care | Payer: Medicare Other | Source: Ambulatory Visit | Attending: Orthopedic Surgery | Admitting: Orthopedic Surgery

## 2017-06-25 DIAGNOSIS — R208 Other disturbances of skin sensation: Secondary | ICD-10-CM | POA: Diagnosis present

## 2017-06-25 DIAGNOSIS — G4733 Obstructive sleep apnea (adult) (pediatric): Secondary | ICD-10-CM | POA: Diagnosis not present

## 2017-06-25 DIAGNOSIS — I10 Essential (primary) hypertension: Secondary | ICD-10-CM | POA: Diagnosis not present

## 2017-06-25 DIAGNOSIS — Z85828 Personal history of other malignant neoplasm of skin: Secondary | ICD-10-CM | POA: Diagnosis not present

## 2017-06-25 DIAGNOSIS — Z951 Presence of aortocoronary bypass graft: Secondary | ICD-10-CM | POA: Diagnosis not present

## 2017-06-25 DIAGNOSIS — S66212A Strain of extensor muscle, fascia and tendon of left thumb at wrist and hand level, initial encounter: Secondary | ICD-10-CM | POA: Diagnosis not present

## 2017-06-25 DIAGNOSIS — I2510 Atherosclerotic heart disease of native coronary artery without angina pectoris: Secondary | ICD-10-CM | POA: Insufficient documentation

## 2017-06-25 DIAGNOSIS — Z96653 Presence of artificial knee joint, bilateral: Secondary | ICD-10-CM | POA: Diagnosis not present

## 2017-06-25 DIAGNOSIS — M65312 Trigger thumb, left thumb: Secondary | ICD-10-CM | POA: Diagnosis not present

## 2017-06-25 DIAGNOSIS — Z8546 Personal history of malignant neoplasm of prostate: Secondary | ICD-10-CM | POA: Insufficient documentation

## 2017-06-25 DIAGNOSIS — Z87891 Personal history of nicotine dependence: Secondary | ICD-10-CM | POA: Diagnosis not present

## 2017-06-25 DIAGNOSIS — E782 Mixed hyperlipidemia: Secondary | ICD-10-CM | POA: Diagnosis not present

## 2017-06-25 DIAGNOSIS — M67844 Other specified disorders of tendon, left hand: Secondary | ICD-10-CM | POA: Diagnosis not present

## 2017-06-25 HISTORY — PX: DORSAL COMPARTMENT RELEASE: SHX5039

## 2017-06-25 SURGERY — RELEASE, FIRST DORSAL COMPARTMENT, HAND
Anesthesia: Monitor Anesthesia Care | Site: Wrist | Laterality: Left

## 2017-06-25 MED ORDER — LIDOCAINE HCL 1 % IJ SOLN
INTRAMUSCULAR | Status: DC | PRN
  Administered 2017-06-25: 10 mL via INTRAMUSCULAR

## 2017-06-25 MED ORDER — FENTANYL CITRATE (PF) 100 MCG/2ML IJ SOLN
50.0000 ug | INTRAMUSCULAR | Status: DC | PRN
  Administered 2017-06-25: 50 ug via INTRAVENOUS

## 2017-06-25 MED ORDER — MIDAZOLAM HCL 2 MG/2ML IJ SOLN: 1.0000 mg | INTRAMUSCULAR | Status: DC | PRN

## 2017-06-25 MED ORDER — PROPOFOL 10 MG/ML IV BOLUS
INTRAVENOUS | Status: DC | PRN
  Administered 2017-06-25 (×3): 10 mg via INTRAVENOUS

## 2017-06-25 MED ORDER — SCOPOLAMINE 1 MG/3DAYS TD PT72: 1.0000 | MEDICATED_PATCH | Freq: Once | TRANSDERMAL | Status: DC | PRN

## 2017-06-25 MED ORDER — OXYCODONE HCL 5 MG PO TABS: 5.0000 mg | ORAL_TABLET | Freq: Four times a day (QID) | ORAL | 0 refills | Status: DC | PRN

## 2017-06-25 MED ORDER — ACETAMINOPHEN 325 MG PO TABS: 650.0000 mg | ORAL_TABLET | Freq: Four times a day (QID) | ORAL | Status: DC

## 2017-06-25 MED ORDER — FENTANYL CITRATE (PF) 100 MCG/2ML IJ SOLN
INTRAMUSCULAR | Status: AC
  Filled 2017-06-25: qty 2

## 2017-06-25 MED ORDER — CLINDAMYCIN PHOSPHATE 900 MG/50ML IV SOLN
900.0000 mg | INTRAVENOUS | Status: AC
  Administered 2017-06-25: 900 mg via INTRAVENOUS

## 2017-06-25 MED ORDER — LIDOCAINE HCL (CARDIAC) 20 MG/ML IV SOLN
INTRAVENOUS | Status: DC | PRN
  Administered 2017-06-25: 30 mg via INTRAVENOUS

## 2017-06-25 MED ORDER — CLINDAMYCIN PHOSPHATE 900 MG/50ML IV SOLN
INTRAVENOUS | Status: AC
  Filled 2017-06-25: qty 50

## 2017-06-25 MED ORDER — LACTATED RINGERS IV SOLN: INTRAVENOUS | Status: DC

## 2017-06-25 MED ORDER — ONDANSETRON HCL 4 MG/2ML IJ SOLN
INTRAMUSCULAR | Status: DC | PRN
  Administered 2017-06-25: 4 mg via INTRAVENOUS

## 2017-06-25 MED ORDER — LACTATED RINGERS IV SOLN
INTRAVENOUS | Status: DC
  Administered 2017-06-25: 09:00:00 via INTRAVENOUS

## 2017-06-25 SURGICAL SUPPLY — 48 items
BENZOIN TINCTURE PRP APPL 2/3 (GAUZE/BANDAGES/DRESSINGS) IMPLANT
BLADE CLIPPER SURG (BLADE) ×2 IMPLANT
BLADE MINI RND TIP GREEN BEAV (BLADE) IMPLANT
BLADE SURG 15 STRL LF DISP TIS (BLADE) ×1 IMPLANT
BLADE SURG 15 STRL SS (BLADE) ×2
BNDG CMPR 9X4 STRL LF SNTH (GAUZE/BANDAGES/DRESSINGS) ×1
BNDG COHESIVE 4X5 TAN STRL (GAUZE/BANDAGES/DRESSINGS) ×2 IMPLANT
BNDG ESMARK 4X9 LF (GAUZE/BANDAGES/DRESSINGS) ×2 IMPLANT
BNDG GAUZE ELAST 4 BULKY (GAUZE/BANDAGES/DRESSINGS) ×2 IMPLANT
CHLORAPREP W/TINT 26ML (MISCELLANEOUS) ×2 IMPLANT
CORD BIPOLAR FORCEPS 12FT (ELECTRODE) ×2 IMPLANT
COVER BACK TABLE 60X90IN (DRAPES) ×2 IMPLANT
COVER MAYO STAND STRL (DRAPES) ×2 IMPLANT
CUFF TOURNIQUET SINGLE 18IN (TOURNIQUET CUFF) ×2 IMPLANT
DRAPE EXTREMITY T 121X128X90 (DRAPE) ×2 IMPLANT
DRAPE SURG 17X23 STRL (DRAPES) ×2 IMPLANT
DRSG EMULSION OIL 3X3 NADH (GAUZE/BANDAGES/DRESSINGS) ×2 IMPLANT
GAUZE SPONGE 4X4 12PLY STRL LF (GAUZE/BANDAGES/DRESSINGS) ×2 IMPLANT
GLOVE BIO SURGEON STRL SZ 6.5 (GLOVE) ×4 IMPLANT
GLOVE BIO SURGEON STRL SZ7 (GLOVE) ×2 IMPLANT
GLOVE BIO SURGEON STRL SZ7.5 (GLOVE) ×2 IMPLANT
GLOVE BIOGEL PI IND STRL 7.0 (GLOVE) ×1 IMPLANT
GLOVE BIOGEL PI IND STRL 8 (GLOVE) ×1 IMPLANT
GLOVE BIOGEL PI INDICATOR 7.0 (GLOVE) ×1
GLOVE BIOGEL PI INDICATOR 8 (GLOVE) ×1
GLOVE ECLIPSE 6.5 STRL STRAW (GLOVE) ×2 IMPLANT
GOWN STRL REUS W/ TWL LRG LVL3 (GOWN DISPOSABLE) ×3 IMPLANT
GOWN STRL REUS W/TWL LRG LVL3 (GOWN DISPOSABLE) ×3
GOWN STRL REUS W/TWL XL LVL3 (GOWN DISPOSABLE) ×2 IMPLANT
NDL SAFETY ECLIPSE 18X1.5 (NEEDLE) IMPLANT
NEEDLE HYPO 18GX1.5 SHARP (NEEDLE)
NEEDLE HYPO 25X1 1.5 SAFETY (NEEDLE) ×4 IMPLANT
NS IRRIG 1000ML POUR BTL (IV SOLUTION) ×2 IMPLANT
PACK BASIN DAY SURGERY FS (CUSTOM PROCEDURE TRAY) ×2 IMPLANT
PADDING CAST ABS 4INX4YD NS (CAST SUPPLIES) ×1
PADDING CAST ABS COTTON 4X4 ST (CAST SUPPLIES) ×1 IMPLANT
SPLINT PLASTER CAST XFAST 3X15 (CAST SUPPLIES) IMPLANT
SPLINT PLASTER XTRA FASTSET 3X (CAST SUPPLIES)
STOCKINETTE 6  STRL (DRAPES) ×1
STOCKINETTE 6 STRL (DRAPES) ×1 IMPLANT
STRIP CLOSURE SKIN 1/2X4 (GAUZE/BANDAGES/DRESSINGS) IMPLANT
SUT VICRYL RAPIDE 4-0 (SUTURE) IMPLANT
SUT VICRYL RAPIDE 4/0 PS 2 (SUTURE) ×2 IMPLANT
SYR 10ML LL (SYRINGE) ×4 IMPLANT
SYR BULB 3OZ (MISCELLANEOUS) IMPLANT
TOWEL OR 17X24 6PK STRL BLUE (TOWEL DISPOSABLE) ×2 IMPLANT
TOWEL OR NON WOVEN STRL DISP B (DISPOSABLE) IMPLANT
UNDERPAD 30X30 (UNDERPADS AND DIAPERS) IMPLANT

## 2017-06-25 NOTE — Anesthesia Postprocedure Evaluation (Signed)
Anesthesia Post Note  Patient: Phillip Nelson  Procedure(s) Performed: LEFT WRIST TENDON SHEATH RELEASE (Left Wrist)     Patient location during evaluation: PACU Anesthesia Type: MAC Level of consciousness: awake and alert Pain management: pain level controlled Vital Signs Assessment: post-procedure vital signs reviewed and stable Respiratory status: spontaneous breathing, nonlabored ventilation, respiratory function stable and patient connected to nasal cannula oxygen Cardiovascular status: stable and blood pressure returned to baseline Postop Assessment: no apparent nausea or vomiting Anesthetic complications: no    Last Vitals:  Vitals:   06/25/17 1015 06/25/17 1058  BP: 126/75 137/83  Pulse: 81 76  Resp: 20 16  Temp:  36.6 C  SpO2: 97% 96%    Last Pain:  Vitals:   06/25/17 1058  TempSrc: Oral  PainSc:                  Montez Hageman

## 2017-06-25 NOTE — Transfer of Care (Signed)
Immediate Anesthesia Transfer of Care Note  Patient: Phillip Nelson  Procedure(s) Performed: LEFT WRIST TENDON SHEATH RELEASE (Left Wrist)  Patient Location: PACU  Anesthesia Type:MAC  Level of Consciousness: awake, alert , oriented and patient cooperative  Airway & Oxygen Therapy: Patient Spontanous Breathing and Patient connected to face mask oxygen  Post-op Assessment: Report given to RN and Post -op Vital signs reviewed and stable  Post vital signs: Reviewed and stable  Last Vitals:  Vitals Value Taken Time  BP    Temp    Pulse 84 06/25/2017 10:10 AM  Resp    SpO2 96 % 06/25/2017 10:10 AM  Vitals shown include unvalidated device data.  Last Pain:  Vitals:   06/25/17 0755  TempSrc: Oral         Complications: No apparent anesthesia complications

## 2017-06-25 NOTE — Discharge Instructions (Signed)
°  Post Anesthesia Home Care Instructions  Activity: Get plenty of rest for the remainder of the day. A responsible individual must stay with you for 24 hours following the procedure.  For the next 24 hours, DO NOT: -Drive a car -Paediatric nurse -Drink alcoholic beverages -Take any medication unless instructed by your physician -Make any legal decisions or sign important papers.  Meals: Start with liquid foods such as gelatin or soup. Progress to regular foods as tolerated. Avoid greasy, spicy, heavy foods. If nausea and/or vomiting occur, drink only clear liquids until the nausea and/or vomiting subsides. Call your physician if vomiting continues.  Special Instructions/Symptoms: Your throat may feel dry or sore from the anesthesia or the breathing tube placed in your throat during surgery. If this causes discomfort, gargle with warm salt water. The discomfort should disappear within 24 hours.  If you had a scopolamine patch placed behind your ear for the management of post- operative nausea and/or vomiting:  1. The medication in the patch is effective for 72 hours, after which it should be removed.  Wrap patch in a tissue and discard in the trash. Wash hands thoroughly with soap and water. 2. You may remove the patch earlier than 72 hours if you experience unpleasant side effects which may include dry mouth, dizziness or visual disturbances. 3. Avoid touching the patch. Wash your hands with soap and water after contact with the patch.      Discharge Instructions   You have a light dressing on your hand.  You may begin gentle motion of your fingers and hand immediately, but you should not do any heavy lifting or gripping.  Elevate your hand to reduce pain & swelling of the digits.  Ice over the operative site may be helpful to reduce pain & swelling.  DO NOT USE HEAT. Pain medicine has been prescribed for you.  Use Tylenol 650 mg every 6 hours and Aleve 220 mg twice per day. Use the  Oxycodone, 5 mg  For severe breakthrough pain. Leave the dressing in place until the third day after your surgery and then remove it, leaving it open to air.  After the bandage has been removed you may shower, regularly washing the incision and letting the water run over it, but not submerging it (no swimming, soaking it in dishwater, etc.) You may drive a car when you are off of prescription pain medications and can safely control your vehicle with both hands. We will address whether therapy will be required or not when you return to the office. You may have already made your follow-up appointment when we completed your preop visit.  If not, please call our office today or the next business day to make your return appointment for 10-15 days after surgery.   Please call [phone] during normal business hours or [phone] after hours for any problems. Including the following:  - excessive redness of the incisions - drainage for more than 4 days - fever of more than 101.5 F  *Please note that pain medications will not be refilled after hours or on weekends.

## 2017-06-25 NOTE — Op Note (Signed)
06/25/2017  8:34 AM  PATIENT:  Phillip Nelson  83 y.o. male  PRE-OPERATIVE DIAGNOSIS: Left wrist EPL triggering  POST-OPERATIVE DIAGNOSIS:  Same  PROCEDURE: Debridement of left EPL tendon at the wrist  SURGEON: Rayvon Char. Grandville Silos, MD  PHYSICIAN ASSISTANT: Morley Kos, OPA-C  ANESTHESIA:  local and MAC  SPECIMENS: Section of tended to pathology to examine for tophus  DRAINS:   None  EBL:  less than 50 mL  PREOPERATIVE INDICATIONS:  Phillip Nelson is a  83 y.o. male with bothersome triggering of the left EPL tendon  The risks benefits and alternatives were discussed with the patient preoperatively including but not limited to the risks of infection, bleeding, nerve injury, cardiopulmonary complications, the need for revision surgery, among others, and the patient verbalized understanding and consented to proceed.  OPERATIVE IMPLANTS: None  OPERATIVE PROCEDURE:  After receiving prophylactic antibiotics, the patient was escorted to the operative theatre and placed in a supine position.  A surgical "time-out" was performed during which the planned procedure, proposed operative site, and the correct patient identity were compared to the operative consent and agreement confirmed by the circulating nurse according to current facility policy.  The surgical field was anesthetized by me with a mixture of lidocaine and Marcaine bearing epinephrine.  Following application of a tourniquet to the operative extremity, the exposed skin was prepped with Chloraprep and draped in the usual sterile fashion.    A 3 limb zigzag incision was marked over the course of the EPL tendon at the level of the wrist.  It was made sharply with a scalpel, subtends tissues dissected with blunt spreading dissection.  Some additional hemostasis was obtained with bipolar electrocautery.  He was fairly lightly sedated during this portion of the procedure and pain control with the field block was adequate.  The EPL  sheath was opened, distal to Lister's tubercle and the tendon found to be enlarged, with some vegetative masses emanating from it, there appeared to be tophaceous invasion of the tendon.  The enlargement of the tendon was excised sharply with a scalpel.  This resulted in incomplete excision of the infiltrative process, and narrowing of the tendon at that region, but still thought to have sufficient tendon to allow it to remain.  Actively, he could extend the thumb again without any catching or triggering noted.  He agreed intraoperatively when asked.  The wound was irrigated, the tendon sheath closed with 4-0 Vicryl Rapide interrupted figure-of-eight sutures and the skin was closed with 4-0 Vicryl Rapide interrupted sutures.  A bulky dressing was applied and he was taken to the recovery room in stable condition.  DISPOSITION: He will be discharged home today with typical instructions, returning 10-15 days.

## 2017-06-25 NOTE — Interval H&P Note (Signed)
History and Physical Interval Note:  06/25/2017 8:34 AM  Phillip Nelson  has presented today for surgery, with the diagnosis of [place]  The various methods of treatment have been discussed with the patient and family. After consideration of risks, benefits and other options for treatment, the patient has consented to  Procedure(s): LEFT WRIST TENDON SHEATH RELEASE VS. REPAIR (Left) as a surgical intervention .  The patient's history has been reviewed, patient examined, no change in status, stable for surgery.  I have reviewed the patient's chart and labs.  Questions were answered to the patient's satisfaction.     Jolyn Nap

## 2017-06-25 NOTE — Anesthesia Preprocedure Evaluation (Signed)
Anesthesia Evaluation  Patient identified by MRN, date of birth, ID band Patient awake    Reviewed: Allergy & Precautions, NPO status , Patient's Chart, lab work & pertinent test results  Airway Mallampati: II  TM Distance: >3 FB Neck ROM: Full    Dental no notable dental hx. (+) Upper Dentures   Pulmonary former smoker,    Pulmonary exam normal breath sounds clear to auscultation       Cardiovascular hypertension, + CAD  Normal cardiovascular exam Rhythm:Regular Rate:Normal     Neuro/Psych negative neurological ROS  negative psych ROS   GI/Hepatic negative GI ROS, Neg liver ROS,   Endo/Other  negative endocrine ROS  Renal/GU negative Renal ROS  negative genitourinary   Musculoskeletal negative musculoskeletal ROS (+)   Abdominal   Peds negative pediatric ROS (+)  Hematology negative hematology ROS (+)   Anesthesia Other Findings   Reproductive/Obstetrics negative OB ROS                             Anesthesia Physical Anesthesia Plan  ASA: III  Anesthesia Plan: MAC   Post-op Pain Management:    Induction:   PONV Risk Score and Plan: 1 and Ondansetron  Airway Management Planned: Simple Face Mask  Additional Equipment:   Intra-op Plan:   Post-operative Plan:   Informed Consent: I have reviewed the patients History and Physical, chart, labs and discussed the procedure including the risks, benefits and alternatives for the proposed anesthesia with the patient or authorized representative who has indicated his/her understanding and acceptance.   Dental advisory given  Plan Discussed with:   Anesthesia Plan Comments:         Anesthesia Quick Evaluation

## 2017-06-25 NOTE — Anesthesia Procedure Notes (Signed)
Procedure Name: MAC Date/Time: 06/25/2017 9:46 AM Performed by: Signe Colt, CRNA Pre-anesthesia Checklist: Patient identified, Emergency Drugs available, Suction available, Patient being monitored and Timeout performed Patient Re-evaluated:Patient Re-evaluated prior to induction Oxygen Delivery Method: Simple face mask

## 2017-06-26 ENCOUNTER — Encounter (HOSPITAL_BASED_OUTPATIENT_CLINIC_OR_DEPARTMENT_OTHER): Payer: Self-pay | Admitting: Orthopedic Surgery

## 2017-07-03 DIAGNOSIS — S66212A Strain of extensor muscle, fascia and tendon of left thumb at wrist and hand level, initial encounter: Secondary | ICD-10-CM | POA: Diagnosis not present

## 2017-08-23 DIAGNOSIS — L820 Inflamed seborrheic keratosis: Secondary | ICD-10-CM | POA: Diagnosis not present

## 2017-08-23 DIAGNOSIS — L508 Other urticaria: Secondary | ICD-10-CM | POA: Diagnosis not present

## 2017-08-23 DIAGNOSIS — C44622 Squamous cell carcinoma of skin of right upper limb, including shoulder: Secondary | ICD-10-CM

## 2017-08-23 DIAGNOSIS — L578 Other skin changes due to chronic exposure to nonionizing radiation: Secondary | ICD-10-CM | POA: Diagnosis not present

## 2017-08-23 DIAGNOSIS — D485 Neoplasm of uncertain behavior of skin: Secondary | ICD-10-CM | POA: Diagnosis not present

## 2017-08-23 DIAGNOSIS — L821 Other seborrheic keratosis: Secondary | ICD-10-CM | POA: Diagnosis not present

## 2017-08-23 DIAGNOSIS — S80861A Insect bite (nonvenomous), right lower leg, initial encounter: Secondary | ICD-10-CM | POA: Diagnosis not present

## 2017-08-23 HISTORY — DX: Squamous cell carcinoma of skin of right upper limb, including shoulder: C44.622

## 2017-09-03 DIAGNOSIS — S80861A Insect bite (nonvenomous), right lower leg, initial encounter: Secondary | ICD-10-CM | POA: Diagnosis not present

## 2017-09-03 DIAGNOSIS — L508 Other urticaria: Secondary | ICD-10-CM | POA: Diagnosis not present

## 2017-09-03 DIAGNOSIS — C44622 Squamous cell carcinoma of skin of right upper limb, including shoulder: Secondary | ICD-10-CM | POA: Diagnosis not present

## 2017-10-25 ENCOUNTER — Other Ambulatory Visit: Payer: Self-pay

## 2017-10-29 ENCOUNTER — Encounter: Payer: Self-pay | Admitting: Family Medicine

## 2017-10-29 ENCOUNTER — Ambulatory Visit (INDEPENDENT_AMBULATORY_CARE_PROVIDER_SITE_OTHER): Payer: Medicare Other | Admitting: Family Medicine

## 2017-10-29 VITALS — BP 138/67 | HR 74 | Temp 98.3°F | Resp 16 | Ht 70.0 in | Wt 220.0 lb

## 2017-10-29 DIAGNOSIS — W57XXXA Bitten or stung by nonvenomous insect and other nonvenomous arthropods, initial encounter: Secondary | ICD-10-CM | POA: Diagnosis not present

## 2017-10-29 DIAGNOSIS — S80861A Insect bite (nonvenomous), right lower leg, initial encounter: Secondary | ICD-10-CM

## 2017-10-29 DIAGNOSIS — R7309 Other abnormal glucose: Secondary | ICD-10-CM

## 2017-10-29 DIAGNOSIS — T466X5A Adverse effect of antihyperlipidemic and antiarteriosclerotic drugs, initial encounter: Secondary | ICD-10-CM | POA: Diagnosis not present

## 2017-10-29 DIAGNOSIS — M791 Myalgia, unspecified site: Secondary | ICD-10-CM | POA: Insufficient documentation

## 2017-10-29 DIAGNOSIS — C61 Malignant neoplasm of prostate: Secondary | ICD-10-CM

## 2017-10-29 DIAGNOSIS — E782 Mixed hyperlipidemia: Secondary | ICD-10-CM | POA: Diagnosis not present

## 2017-10-29 NOTE — Patient Instructions (Addendum)
Thank you for coming to the office today.  Labs today - stay tuned for results.  We will also check for Lyme as discussed.  DUE for FASTING BLOOD WORK (no food or drink after midnight before the lab appointment, only water or coffee without cream/sugar on the morning of)  TODAY  Please schedule a Follow-up Appointment to: Return in about 1 year (around 10/30/2018) for Yearly Medicare Check up.  If you have any other questions or concerns, please feel free to call the office or send a message through [place]. You may also schedule an earlier appointment if necessary.  Additionally, you may be receiving a survey about your experience at our office within a few days to 1 week by e-mail or mail. We value your feedback.  Phillip Putnam, DO [facility]

## 2017-10-29 NOTE — Assessment & Plan Note (Signed)
Due for lipids Last lipid panel >1-2 years Elevated ASCVD with known CAD Prior myalgia on statin rosuvastatin 5mg   Plan: 1. Check lipids determine if may consider intermittent statin dosing 2. Continue ASA 81mg  for secnodary ASCVD risk reduction 3. Encourage improved lifestyle - low carb/cholesterol, reduce portion size, continue improving regular exercise 4. Follow-up yearly lipids

## 2017-10-29 NOTE — Assessment & Plan Note (Signed)
Prior myalgia induced by rosuvastatin 5mg , since discontinued

## 2017-10-29 NOTE — Progress Notes (Signed)
Subjective:    Patient ID: Phillip Nelson, male    DOB: 01/23/1935, 83 y.o.   MRN: [id number]  Phillip Nelson is a 83 y.o. male presenting on 10/29/2017 for Prostate Cancer (need lab work for PSA)   HPI   Follow-up Prostate Cancer in remission s/p radioactive seed implant Previously followed by [facility] in [place], had radioactive see implantation in 2014 with very good results. Had been followed by urology with yearly PSA, last results were negative < 0.003 by report, do not have copy of report, now released by urology. Asymptomatic currently without endorsed symptoms of LUTS. - admits will have some nocturia once already awake due to shoulder problems - Due for repeat PSA today Denies fevers, chills, unintentional weight loss, dysuria, hematuria, urinary frequency, weak stream, nocturia worsening  Tick Bite, Right lower leg Onset 6 weeks ago with tick, present for >24 hours, unsure, it was removed completely including the head. He never developed rash extending from this areas, he did have a scab that did not heal. He saw Dermatologist Dr Nehemiah Massed and they did a steroid injection at this point. And he has had other areas of skin cancer removed. - Still has scab and asking if can be tested for lyme disease as a precaution  HYPERLIPIDEMIA: - Reports no concerns. Last lipid panel >1-2 years ago - Not on cholesterol medication currently. He has history of Statin Myalgia and cannot take statin, previously Rosuvastatin 5mg  - he will reconsider - He tries to stay active. Eats balanced diet.  Elevated Glucose No recent A1c measurements. He has prior abnormal glucose readings, due for lab check. No prior dx of hyperglycemia or pre-diabetes. Not on any medications  Health Maintenance: - No record of PNA vaccines, he has received this before after age 31, request [place] to check record - Will return for Flu vaccine   Past Surgical History:  Procedure Laterality Date  .  APPENDECTOMY     Life was saved with emerg. surgery.   [facility] Kitchen CARDIAC SURGERY    . CARDIOVASCULAR STRESS TEST  09-07-2011  dr Saralyn Pilar   normal perfusion/ ef 57%/ no ischemia  . CARPECTOMY Right    wrist  . CATARACT EXTRACTION W/ INTRAOCULAR LENS  IMPLANT, BILATERAL    . COLONOSCOPY WITH PROPOFOL N/A 10/31/2016   Procedure: COLONOSCOPY WITH PROPOFOL;  Surgeon: Christene Lye, MD;  Location: [facility];  Service: Endoscopy;  Laterality: N/A;  . CORONARY ARTERY BYPASS GRAFT  JULY 2005  ([facility])   LIMA TO THE LAD  . DORSAL COMPARTMENT RELEASE Left 06/25/2017   Procedure: LEFT WRIST TENDON SHEATH RELEASE;  Surgeon: Milly Jakob, MD;  Location: [place];  Service: Orthopedics;  Laterality: Left;  . FOOT SURGERY Bilateral    GOUT  . KNEE ARTHROSCOPY Bilateral   . LEFT ELBOW SURGERY  AGE 57  &  2007  . RADIOACTIVE SEED IMPLANT N/A 08/02/2012   Procedure: RADIOACTIVE SEED IMPLANT;  Surgeon: Claybon Jabs, MD;  Location: [facility];  Service: Urology;  Laterality: N/A;  . TOTAL KNEE ARTHROPLASTY Bilateral LEFT 2012/   RIGHT 2006  . TRANSTHORACIC ECHOCARDIOGRAM  11-24-2010   normal lvf &  lvh/ mild mr, tr, and ai     Depression screen [facility] 2/9 10/29/2017 07/10/2016 01/03/2016  Decreased Interest 0 0 0  Down, Depressed, Hopeless 0 0 0  PHQ - 2 Score 0 0 0    Social History   Tobacco Use  . Smoking status: Former  Smoker    Packs/day: 3.00    Years: 31.00    Pack years: 93.00    Last attempt to quit: 08/01/1990    Years since quitting: 27.2  . Smokeless tobacco: Never Used  Substance Use Topics  . Alcohol use: No    Comment: rarely  6 beers year,  . Drug use: No    Comment: quit 36moking 25 years ago    Review of Systems Per HPI unless specifically indicated above     Objective:    BP 138/67   Pulse 74   Temp 98.3 F (36.8 C) (Oral)   Resp 16   Ht 5\' 10"  (1.778 m)   Wt 220 lb (99.8 kg)   BMI 31.57 kg/m   Wt Readings from Last 3  Encounters:  10/29/17 220 lb (99.8 kg)  06/25/17 221 lb (100.2 kg)  05/11/17 217 lb (98.4 kg)    Physical Exam  Constitutional: He is oriented to person, place, and time. He appears well-developed and well-nourished. No distress.  Well-appearing, comfortable, cooperative  HENT:  Head: Normocephalic and atraumatic.  Mouth/Throat: Oropharynx is clear and moist.  Eyes: Conjunctivae are normal. Right eye exhibits no discharge. Left eye exhibits no discharge.  Neck: Normal range of motion. Neck supple. No thyromegaly present.  Cardiovascular: Normal rate, regular rhythm, normal heart sounds and intact distal pulses.  No murmur heard. Pulmonary/Chest: Effort normal and breath sounds normal. No respiratory distress. He has no wheezes. He has no rales.  Musculoskeletal: Normal range of motion. He exhibits no edema.  Lymphadenopathy:    He has no cervical adenopathy.  Neurological: He is alert and oriented to person, place, and time.  Skin: Skin is warm and dry. No rash noted. He is not diaphoretic. No erythema.  Right lower leg inner aspect - with residual scab with very localized irritation without actual erythema, induration, or rash.  Multiple Seborrheic keratoses on skin.  Right forearm well healed s/p excision of skin cancer  Psychiatric: He has a normal mood and affect. His behavior is normal.  Well groomed, good eye contact, normal speech and thoughts  Nursing note and vitals reviewed.  Results for orders placed or performed during the hospital encounter of 03/50/09  Basic metabolic panel  Result Value Ref Range   Sodium 137 135 - 145 mmol/L   Potassium 3.3 (L) 3.5 - 5.1 mmol/L   Chloride 107 101 - 111 mmol/L   CO2 23 22 - 32 mmol/L   Glucose, Bld 114 (H) 65 - 99 mg/dL   BUN 16 6 - 20 mg/dL   Creatinine, Ser 0.88 0.61 - 1.24 mg/dL   Calcium 8.8 (L) 8.9 - 10.3 mg/dL   GFR calc non Af Amer >60 >60 mL/min   GFR calc Af Amer >60 >60 mL/min   Anion gap 7 5 - 15  CBC  Result  Value Ref Range   WBC 10.5 3.8 - 10.6 K/uL   RBC 4.56 4.40 - 5.90 MIL/uL   Hemoglobin 14.8 13.0 - 18.0 g/dL   HCT 44.1 40.0 - 52.0 %   MCV 96.9 80.0 - 100.0 fL   MCH 32.6 26.0 - 34.0 pg   MCHC 33.7 32.0 - 36.0 g/dL   RDW 13.1 11.5 - 14.5 %   Platelets 242 150 - 440 K/uL  Ethanol  Result Value Ref Range   Alcohol, Ethyl (B) <10 <10 mg/dL  Protime-INR  Result Value Ref Range   Prothrombin Time 12.7 11.4 - 15.2 seconds   INR  0.96   APTT  Result Value Ref Range   aPTT 29 24 - 36 seconds  Troponin I  Result Value Ref Range   Troponin I <0.03 <0.03 ng/mL  Urine Drug Screen, Qualitative  Result Value Ref Range   Tricyclic, Ur Screen NONE DETECTED NONE DETECTED   Amphetamines, Ur Screen NONE DETECTED NONE DETECTED   MDMA (Ecstasy)Ur Screen NONE DETECTED NONE DETECTED   Cocaine Metabolite,Ur [place] NONE DETECTED NONE DETECTED   Opiate, Ur Screen NONE DETECTED NONE DETECTED   Phencyclidine (PCP) Ur S NONE DETECTED NONE DETECTED   Cannabinoid 50 Ng, Ur [place] NONE DETECTED NONE DETECTED   Barbiturates, Ur Screen NONE DETECTED NONE DETECTED   Benzodiazepine, Ur Scrn NONE DETECTED NONE DETECTED   Methadone Scn, Ur NONE DETECTED NONE DETECTED  Urinalysis, Routine w reflex microscopic  Result Value Ref Range   Color, Urine YELLOW (A) YELLOW   APPearance CLEAR (A) CLEAR   Specific Gravity, Urine 1.013 1.005 - 1.030   pH 8.0 5.0 - 8.0   Glucose, UA NEGATIVE NEGATIVE mg/dL   Hgb urine dipstick NEGATIVE NEGATIVE   Bilirubin Urine NEGATIVE NEGATIVE   Ketones, ur NEGATIVE NEGATIVE mg/dL   Protein, ur NEGATIVE NEGATIVE mg/dL   Nitrite NEGATIVE NEGATIVE   Leukocytes, UA NEGATIVE NEGATIVE   RBC / HPF 0-5 0 - 5 RBC/hpf   WBC, UA 0-5 0 - 5 WBC/hpf   Bacteria, UA NONE SEEN NONE SEEN   Squamous Epithelial / LPF 0-5 (A) NONE SEEN      Assessment & Plan:   Problem List Items Addressed This Visit    Hyperlipemia, mixed    Due for lipids Last lipid panel >1-2 years Elevated ASCVD with known  CAD Prior myalgia on statin rosuvastatin 5mg   Plan: 1. Check lipids determine if may consider intermittent statin dosing 2. Continue ASA 81mg  for secnodary ASCVD risk reduction 3. Encourage improved lifestyle - low carb/cholesterol, reduce portion size, continue improving regular exercise 4. Follow-up yearly lipids       Relevant Orders   COMPLETE METABOLIC PANEL WITH GFR   Lipid panel   Myalgia due to statin    Prior myalgia induced by rosuvastatin 5mg , since discontinued      Relevant Orders   Lipid panel   Prostate cancer ([place]) - Primary    Currently asymptomatic, seems to be in remission by last report of Urology eval 1 year ago S/p radioactive seed implant 2014 No available PSA lab in current system  Plan Check PSA today - pending result will determine follow-up yearly or less, if elevated then concern for return to [facility] will discuss with patient Follow-up as planned      Relevant Orders   PSA, Total with Reflex to PSA, Free    Other Visit Diagnoses    Tick bite of right lower leg, initial encounter     Clinically less likely to be Lyme, given no erythema migrans reaction or other symptoms now 6 weeks later Will check lyme antibodies by patient request today     Relevant Orders   B. burgdorfi antibodies   Abnormal glucose       Relevant Orders   Hemoglobin A1c   COMPLETE METABOLIC PANEL WITH GFR      No orders of the defined types were placed in this encounter.   Follow up plan: Return in about 1 year (around 10/30/2018) for Yearly Medicare Check up.  Future labs will be ordered for 10/30/18 once current labs resulted.  [name],  [facility] 10/29/2017, 12:14 PM

## 2017-10-29 NOTE — Assessment & Plan Note (Signed)
Currently asymptomatic, seems to be in remission by last report of Urology eval 1 year ago S/p radioactive seed implant 2014 No available PSA lab in current system  Plan Check PSA today - pending result will determine follow-up yearly or less, if elevated then concern for return to [facility] will discuss with patient Follow-up as planned

## 2017-10-30 LAB — COMPLETE METABOLIC PANEL WITH GFR
AG Ratio: 1.3 (calc) (ref 1.0–2.5)
ALT: 32 U/L (ref 9–46)
AST: 34 U/L (ref 10–35)
Albumin: 4.2 g/dL (ref 3.6–5.1)
Alkaline phosphatase (APISO): 62 U/L (ref 40–115)
BUN: 13 mg/dL (ref 7–25)
CO2: 24 mmol/L (ref 20–32)
Calcium: 9.6 mg/dL (ref 8.6–10.3)
Chloride: 106 mmol/L (ref 98–110)
Creat: 0.99 mg/dL (ref 0.70–1.11)
GFR, Est African American: 83 mL/min/{1.73_m2} (ref >=60)
GFR, Est Non African American: 72 mL/min/{1.73_m2} (ref >=60)
Globulin: 3.2 g/dL (calc) (ref 1.9–3.7)
Glucose, Bld: 100 mg/dL — ABNORMAL HIGH (ref 65–99)
Potassium: 4.5 mmol/L (ref 3.5–5.3)
Sodium: 139 mmol/L (ref 135–146)
Total Bilirubin: 1.1 mg/dL (ref 0.2–1.2)
Total Protein: 7.4 g/dL (ref 6.1–8.1)

## 2017-10-30 LAB — LIPID PANEL
Cholesterol: 180 mg/dL (ref <200)
HDL: 60 mg/dL (ref >40)
LDL Cholesterol (Calc): 96 mg/dL (calc)
Non-HDL Cholesterol (Calc): 120 mg/dL (calc) (ref <130)
Total CHOL/HDL Ratio: 3 (calc) (ref <5.0)
Triglycerides: 144 mg/dL (ref <150)

## 2017-10-30 LAB — B. BURGDORFI ANTIBODIES: B burgdorferi Ab IgG+IgM: <0.9 index

## 2017-10-30 LAB — HEMOGLOBIN A1C
Hgb A1c MFr Bld: 5.7 % of total Hgb — ABNORMAL HIGH (ref <5.7)
Mean Plasma Glucose: 117 (calc)
eAG (mmol/L): 6.5 (calc)

## 2017-10-30 LAB — PSA, TOTAL WITH REFLEX TO PSA, FREE: PSA, Total: <0.1 ng/mL (ref <=4.0)

## 2017-10-31 ENCOUNTER — Encounter: Payer: Self-pay | Admitting: Family Medicine

## 2017-10-31 DIAGNOSIS — R7309 Other abnormal glucose: Secondary | ICD-10-CM | POA: Insufficient documentation

## 2017-10-31 DIAGNOSIS — R7303 Prediabetes: Secondary | ICD-10-CM | POA: Insufficient documentation

## 2017-11-04 ENCOUNTER — Other Ambulatory Visit: Payer: Self-pay | Admitting: Family Medicine

## 2017-11-04 DIAGNOSIS — I2510 Atherosclerotic heart disease of native coronary artery without angina pectoris: Secondary | ICD-10-CM

## 2017-11-04 DIAGNOSIS — C61 Malignant neoplasm of prostate: Secondary | ICD-10-CM

## 2017-11-04 DIAGNOSIS — R7309 Other abnormal glucose: Secondary | ICD-10-CM

## 2017-11-04 DIAGNOSIS — I10 Essential (primary) hypertension: Secondary | ICD-10-CM

## 2017-11-04 DIAGNOSIS — G4733 Obstructive sleep apnea (adult) (pediatric): Secondary | ICD-10-CM

## 2017-11-04 DIAGNOSIS — E782 Mixed hyperlipidemia: Secondary | ICD-10-CM

## 2017-11-27 ENCOUNTER — Ambulatory Visit: Payer: Medicare Other

## 2017-12-03 DIAGNOSIS — R42 Dizziness and giddiness: Secondary | ICD-10-CM | POA: Diagnosis not present

## 2017-12-11 ENCOUNTER — Ambulatory Visit (INDEPENDENT_AMBULATORY_CARE_PROVIDER_SITE_OTHER): Payer: Medicare Other

## 2017-12-11 VITALS — BP 132/82 | HR 84 | Temp 97.8°F | Resp 17 | Ht 70.0 in | Wt 220.8 lb

## 2017-12-11 DIAGNOSIS — Z0000 Encounter for general adult medical examination without abnormal findings: Secondary | ICD-10-CM

## 2017-12-11 NOTE — Patient Instructions (Signed)
Phillip Nelson , Thank you for taking time to come for your Medicare Wellness Visit. I appreciate your ongoing commitment to your health goals. Please review the following plan we discussed and let me know if I can assist you in the future.   Screening recommendations/referrals: Colonoscopy: completed 10/31/2016 Recommended yearly ophthalmology/optometry visit for glaucoma screening and checkup Recommended yearly dental visit for hygiene and checkup  Vaccinations: Influenza vaccine: due, will be available at the office October 1st,2019 Pneumococcal vaccine: completed series Tdap vaccine: completed 08/10/2014 Shingles vaccine: shingrix eligible, check with your insurance company for coverage     Advanced directives: Please bring a copy of your health care power of attorney and living will to the office at your convenience.  Conditions/risks identified: Recommend drinking at least 6-8 glasses of water a day   Next appointment: Follow up in one year for your annual wellness exam.   Preventive Care 65 Years and Older, Male Preventive care refers to lifestyle choices and visits with your health care provider that can promote health and wellness. What does preventive care include?  A yearly physical exam. This is also called an annual well check.  Dental exams once or twice a year.  Routine eye exams. Ask your health care provider how often you should have your eyes checked.  Personal lifestyle choices, including:  Daily care of your teeth and gums.  Regular physical activity.  Eating a healthy diet.  Avoiding tobacco and drug use.  Limiting alcohol use.  Practicing safe sex.  Taking low doses of aspirin every day.  Taking vitamin and mineral supplements as recommended by your health care provider. What happens during an annual well check? The services and screenings done by your health care provider during your annual well check will depend on your age, overall health,  lifestyle risk factors, and family history of disease. Counseling  Your health care provider may ask you questions about your:  Alcohol use.  Tobacco use.  Drug use.  Emotional well-being.  Home and relationship well-being.  Sexual activity.  Eating habits.  History of falls.  Memory and ability to understand (cognition).  Work and work Statistician. Screening  You may have the following tests or measurements:  Height, weight, and BMI.  Blood pressure.  Lipid and cholesterol levels. These may be checked every 5 years, or more frequently if you are over 33 years old.  Skin check.  Lung cancer screening. You may have this screening every year starting at age 64 if you have a 30-pack-year history of smoking and currently smoke or have quit within the past 15 years.  Fecal occult blood test (FOBT) of the stool. You may have this test every year starting at age 46.  Flexible sigmoidoscopy or colonoscopy. You may have a sigmoidoscopy every 5 years or a colonoscopy every 10 years starting at age 62.  Prostate cancer screening. Recommendations will vary depending on your family history and other risks.  Hepatitis C blood test.  Hepatitis B blood test.  Sexually transmitted disease (STD) testing.  Diabetes screening. This is done by checking your blood sugar (glucose) after you have not eaten for a while (fasting). You may have this done every 1-3 years.  Abdominal aortic aneurysm (AAA) screening. You may need this if you are a current or former smoker.  Osteoporosis. You may be screened starting at age 60 if you are at high risk. Talk with your health care provider about your test results, treatment options, and if necessary, the need  for more tests. Vaccines  Your health care provider may recommend certain vaccines, such as:  Influenza vaccine. This is recommended every year.  Tetanus, diphtheria, and acellular pertussis (Tdap, Td) vaccine. You may need a Td booster  every 10 years.  Zoster vaccine. You may need this after age 37.  Pneumococcal 13-valent conjugate (PCV13) vaccine. One dose is recommended after age 74.  Pneumococcal polysaccharide (PPSV23) vaccine. One dose is recommended after age 71. Talk to your health care provider about which screenings and vaccines you need and how often you need them. This information is not intended to replace advice given to you by your health care provider. Make sure you discuss any questions you have with your health care provider. Document Released: 04/09/2015 Document Revised: 12/01/2015 Document Reviewed: 01/12/2015 Elsevier Interactive Patient Education  2017 [place] Prevention in the Home Falls can cause injuries. They can happen to people of all ages. There are many things you can do to make your home safe and to help prevent falls. What can I do on the outside of my home?  Regularly fix the edges of walkways and driveways and fix any cracks.  Remove anything that might make you trip as you walk through a door, such as a raised step or threshold.  Trim any bushes or trees on the path to your home.  Use bright outdoor lighting.  Clear any walking paths of anything that might make someone trip, such as rocks or tools.  Regularly check to see if handrails are loose or broken. Make sure that both sides of any steps have handrails.  Any raised decks and porches should have guardrails on the edges.  Have any leaves, snow, or ice cleared regularly.  Use sand or salt on walking paths during winter.  Clean up any spills in your garage right away. This includes oil or grease spills. What can I do in the bathroom?  Use night lights.  Install grab bars by the toilet and in the tub and shower. Do not use towel bars as grab bars.  Use non-skid mats or decals in the tub or shower.  If you need to sit down in the shower, use a plastic, non-slip stool.  Keep the floor dry. Clean up any  water that spills on the floor as soon as it happens.  Remove soap buildup in the tub or shower regularly.  Attach bath mats securely with double-sided non-slip rug tape.  Do not have throw rugs and other things on the floor that can make you trip. What can I do in the bedroom?  Use night lights.  Make sure that you have a light by your bed that is easy to reach.  Do not use any sheets or blankets that are too big for your bed. They should not hang down onto the floor.  Have a firm chair that has side arms. You can use this for support while you get dressed.  Do not have throw rugs and other things on the floor that can make you trip. What can I do in the kitchen?  Clean up any spills right away.  Avoid walking on wet floors.  Keep items that you use a lot in easy-to-reach places.  If you need to reach something above you, use a strong step stool that has a grab bar.  Keep electrical cords out of the way.  Do not use floor polish or wax that makes floors slippery. If you must use wax, use  non-skid floor wax.  Do not have throw rugs and other things on the floor that can make you trip. What can I do with my stairs?  Do not leave any items on the stairs.  Make sure that there are handrails on both sides of the stairs and use them. Fix handrails that are broken or loose. Make sure that handrails are as long as the stairways.  Check any carpeting to make sure that it is firmly attached to the stairs. Fix any carpet that is loose or worn.  Avoid having throw rugs at the top or bottom of the stairs. If you do have throw rugs, attach them to the floor with carpet tape.  Make sure that you have a light switch at the top of the stairs and the bottom of the stairs. If you do not have them, ask someone to add them for you. What else can I do to help prevent falls?  Wear shoes that:  Do not have high heels.  Have rubber bottoms.  Are comfortable and fit you well.  Are closed  at the toe. Do not wear sandals.  If you use a stepladder:  Make sure that it is fully opened. Do not climb a closed stepladder.  Make sure that both sides of the stepladder are locked into place.  Ask someone to hold it for you, if possible.  Clearly mark and make sure that you can see:  Any grab bars or handrails.  First and last steps.  Where the edge of each step is.  Use tools that help you move around (mobility aids) if they are needed. These include:  Canes.  Walkers.  Scooters.  Crutches.  Turn on the lights when you go into a dark area. Replace any light bulbs as soon as they burn out.  Set up your furniture so you have a clear path. Avoid moving your furniture around.  If any of your floors are uneven, fix them.  If there are any pets around you, be aware of where they are.  Review your medicines with your doctor. Some medicines can make you feel dizzy. This can increase your chance of falling. Ask your doctor what other things that you can do to help prevent falls. This information is not intended to replace advice given to you by your health care provider. Make sure you discuss any questions you have with your health care provider. Document Released: 01/07/2009 Document Revised: 08/19/2015 Document Reviewed: 04/17/2014 Elsevier Interactive Patient Education  2017 [employer].

## 2017-12-11 NOTE — Progress Notes (Signed)
Subjective:   Phillip Nelson is a 83 y.o. male who presents for Medicare Annual/Subsequent preventive examination.  Review of Systems:   Cardiac Risk Factors include: hypertension;dyslipidemia;advanced age (>58men, >45 women);male gender     Objective:    Vitals: BP 132/82 (BP Location: Left Arm, Patient Position: Sitting)   Pulse 84   Temp 97.8 F (36.6 C) (Oral)   Resp 17   Ht 5\' 10"  (1.778 m)   Wt 220 lb 12.8 oz (100.2 kg)   BMI 31.68 kg/m   Body mass index is 31.68 kg/m.  Advanced Directives 12/11/2017 06/25/2017 05/11/2017 10/31/2016 08/26/2016 08/02/2012  Does Patient Have a Medical Advance Directive? Yes No No;Yes No Yes Patient has advance directive, copy not in chart  Type of Advance Directive Healthcare Power of [place]  Does patient want to make changes to medical advance directive? - - No - Patient declined - - -  Copy of [place] in Chart? No - copy requested - Yes - No - copy requested Copy requested from family  Would patient like information on creating a medical advance directive? - No - Patient declined No - Patient declined No - Patient declined - -  Pre-existing out of facility DNR order (yellow form or pink MOST form) - - - - - No    Tobacco Social History   Tobacco Use  Smoking Status Former Smoker  . Packs/day: 3.00  . Years: 31.00  . Pack years: 93.00  . Last attempt to quit: 08/01/1990  . Years since quitting: 27.3  Smokeless Tobacco Never Used     Counseling given: Not Answered   Clinical Intake:  Pre-visit preparation completed: Yes  Pain : No/denies pain     Nutritional Status: BMI > 30  Obese Nutritional Risks: None Diabetes: No  How often do you need to have someone help you when you read instructions, pamphlets, or other written materials from your doctor or pharmacy?: 1 - Never What is the last grade level you completed  in school?: some college   Interpreter Needed?: No  Information entered by :: Tiffany Hill,LPN   Past Medical History:  Diagnosis Date  . Angina at rest [facility])   . Aural vertigo   . Coronary artery disease [place]  . ED (erectile dysfunction)   . Gout, arthritis   . History of skin cancer   . Hyperlipidemia   . OSA (obstructive sleep apnea)    CPAP NON-COMPLIANT  . Prostate cancer ([place]) 05/08/12   Adenocarcinoma,gleason=3+3=6,PSA=8.26,vol=57cc   Past Surgical History:  Procedure Laterality Date  . APPENDECTOMY     Life was saved with emerg. surgery.   Marland Kitchen CARDIAC SURGERY    . CARDIOVASCULAR STRESS TEST  09-07-2011  dr Saralyn Pilar   normal perfusion/ ef 57%/ no ischemia  . CARPECTOMY Right    wrist  . CATARACT EXTRACTION W/ INTRAOCULAR LENS  IMPLANT, BILATERAL    . COLONOSCOPY WITH PROPOFOL N/A 10/31/2016   Procedure: COLONOSCOPY WITH PROPOFOL;  Surgeon: Christene Lye, MD;  Location: [facility] ENDOSCOPY;  Service: Endoscopy;  Laterality: N/A;  . CORONARY ARTERY BYPASS GRAFT  JULY 2005  ([facility])   LIMA TO THE LAD  . DORSAL COMPARTMENT RELEASE Left 06/25/2017   Procedure: LEFT WRIST TENDON SHEATH RELEASE;  Surgeon: Milly Jakob, MD;  Location: [facility];  Service: Orthopedics;  Laterality: Left;  . FOOT SURGERY Bilateral    GOUT  .  KNEE ARTHROSCOPY Bilateral   . LEFT ELBOW SURGERY  AGE 6  &  2007  . RADIOACTIVE SEED IMPLANT N/A 08/02/2012   Procedure: RADIOACTIVE SEED IMPLANT;  Surgeon: Claybon Jabs, MD;  Location: [facility];  Service: Urology;  Laterality: N/A;  . TOTAL KNEE ARTHROPLASTY Bilateral LEFT 2012/   RIGHT 2006  . TRANSTHORACIC ECHOCARDIOGRAM  11-24-2010   normal lvf &  lvh/ mild mr, tr, and ai   Family History  Problem Relation Age of Onset  . Alzheimer's disease Father   . Cancer Sister 68       treated surgically   Social History   Socioeconomic History  . Marital status: Significant Other    Spouse name: Not on  file  . Number of children: Not on file  . Years of education: Not on file  . Highest education level: Some college, no degree  Occupational History  . Occupation: Retired  Scientific laboratory technician  . Financial resource strain: Not hard at all  . Food insecurity:    Worry: Never true    Inability: Never true  . Transportation needs:    Medical: No    Non-medical: No  Tobacco Use  . Smoking status: Former Smoker    Packs/day: 3.00    Years: 31.00    Pack years: 93.00    Last attempt to quit: 08/01/1990    Years since quitting: 27.3  . Smokeless tobacco: Never Used  Substance and Sexual Activity  . Alcohol use: No    Comment: rarely  6 beers year,  . Drug use: No  . Sexual activity: Not on file  Lifestyle  . Physical activity:    Days per week: 0 days    Minutes per session: 0 min  . Stress: Not at all  Relationships  . Social connections:    Talks on phone: More than three times a week    Gets together: More than three times a week    Attends religious service: Never    Active member of club or organization: No    Attends meetings of clubs or organizations: Never    Relationship status: Living with partner  Other Topics Concern  . Not on file  Social History Narrative   golfs 1-2 days a week    Outpatient Encounter Medications as of 12/11/2017  Medication Sig  . allopurinol (ZYLOPRIM) 300 MG tablet TAKE ONE (1) TABLET BY MOUTH EVERY DAY AS DIRECTED  . aspirin EC 81 MG tablet Take by mouth.  . hydrocortisone 2.5 % cream Apply 1 application topically as needed.  . meclizine (ANTIVERT) 25 MG tablet   . metoprolol succinate (TOPROL-XL) 25 MG 24 hr tablet Take 12.5 mg by mouth daily.   . naproxen sodium (ANAPROX) 220 MG tablet Take 220 mg by mouth as needed.   . Potassium 99 MG TABS Take by mouth.  . vitamin B-12 (CYANOCOBALAMIN) 100 MCG tablet Take 50 mcg by mouth daily.   Marland Kitchen acetaminophen (TYLENOL) 325 MG tablet Take 2 tablets (650 mg total) by mouth every 6 (six) hours. (Patient  not taking: Reported on 10/29/2017)  . albuterol (PROVENTIL HFA;VENTOLIN HFA) 108 (90 Base) MCG/ACT inhaler Inhale 2 puffs into the lungs every 6 (six) hours as needed for wheezing or shortness of breath (cough). (Patient not taking: Reported on 10/29/2017)   No facility-administered encounter medications on file as of 12/11/2017.     Activities of Daily Living In your present state of health, do you have any difficulty  performing the following activities: 12/11/2017 06/25/2017  Hearing? N N  Vision? N N  Difficulty concentrating or making decisions? N N  Walking or climbing stairs? N N  Dressing or bathing? N N  Doing errands, shopping? N -  Preparing Food and eating ? N -  Using the Toilet? N -  In the past six months, have you accidently leaked urine? N -  Do you have problems with loss of bowel control? N -  Managing your Medications? N -  Managing your Finances? N -  Housekeeping or managing your Housekeeping? N -  Some recent data might be hidden    Patient Care Team: Olin Hauser, DO as PCP - General (Family Medicine) Beverly Gust, MD (Otolaryngology) Ralene Bathe, MD (Dermatology)   Assessment:   This is a routine wellness examination for Phillip Nelson.  Exercise Activities and Dietary recommendations Current Exercise Habits: The patient does not participate in regular exercise at present(golfs 1-2 days a week ), Exercise limited by: None identified  Goals    . DIET - INCREASE WATER INTAKE     Recommend drinking at least 6-8 glasses of water a day        Fall Risk Fall Risk  12/11/2017 10/29/2017 07/10/2016 01/03/2016 03/02/2015  Falls in the past year? No No No No No   Is the patient's home free of loose throw rugs in walkways, pet beds, electrical cords, etc?   no      Grab bars in the bathroom? no      Handrails on the stairs?   yes      Adequate lighting?   yes  Timed Get Up and Go Performed: Completed in 8 seconds with no use of assistive devices, steady  gait. No intervention needed at this time.   Depression Screen PHQ 2/9 Scores 12/11/2017 10/29/2017 07/10/2016 01/03/2016  PHQ - 2 Score 0 0 0 0    Cognitive Function     6CIT Screen 12/11/2017  What Year? 0 points  What month? 0 points  What time? 0 points  Count back from 20 0 points  Months in reverse 0 points  Repeat phrase 0 points  Total Score 0    Immunization History  Administered Date(s) Administered  . Influenza, High Dose Seasonal PF 02/02/2015, 01/10/2017  . Influenza-Unspecified 01/02/2013, 01/08/2014, 02/02/2015  . Pneumococcal Conjugate-13 08/10/2010  . Pneumococcal Polysaccharide-23 08/10/2014  . Tdap 08/10/2014    Qualifies for Shingles Vaccine? Yes, discussed shingrix vaccine   Screening Tests Health Maintenance  Topic Date Due  . PNA vac Low Risk Adult (2 of 2 - PPSV23) 08/10/2011  . INFLUENZA VACCINE  10/25/2017  . TETANUS/TDAP  08/09/2024   Cancer Screenings: Lung: Low Dose CT Chest recommended if Age 73-80 years, 30 pack-year currently smoking OR have quit w/in 15years. Patient does not qualify. Colorectal: completed 10/31/2016  Additional Screenings: Hepatitis C Screening: not indicated       Plan:    I have personally reviewed and addressed the Medicare Annual Wellness questionnaire and have noted the following in the patient's chart:  A. Medical and social history B. Use of alcohol, tobacco or illicit drugs  C. Current medications and supplements D. Functional ability and status E.  Nutritional status F.  Physical activity G. Advance directives H. List of other physicians I.  Hospitalizations, surgeries, and ER visits in previous 12 months J.  [place] such as hearing and vision if needed, cognitive and depression L. Referrals and appointments  In addition, I have reviewed and discussed with patient certain preventive protocols, quality metrics, and best practice recommendations. A written personalized care plan for preventive  services as well as general preventive health recommendations were provided to patient.   Signed,  Tyler Aas, LPN Nurse Health Advisor   Nurse Notes:none

## 2018-01-10 ENCOUNTER — Ambulatory Visit (INDEPENDENT_AMBULATORY_CARE_PROVIDER_SITE_OTHER): Payer: Medicare Other

## 2018-01-10 DIAGNOSIS — Z23 Encounter for immunization: Secondary | ICD-10-CM | POA: Diagnosis not present

## 2018-01-26 DIAGNOSIS — G510 Bell's palsy: Secondary | ICD-10-CM | POA: Diagnosis not present

## 2018-01-26 DIAGNOSIS — Z87891 Personal history of nicotine dependence: Secondary | ICD-10-CM | POA: Diagnosis not present

## 2018-01-26 DIAGNOSIS — G459 Transient cerebral ischemic attack, unspecified: Secondary | ICD-10-CM | POA: Diagnosis not present

## 2018-01-26 DIAGNOSIS — I452 Bifascicular block: Secondary | ICD-10-CM | POA: Diagnosis not present

## 2018-01-26 DIAGNOSIS — S8002XA Contusion of left knee, initial encounter: Secondary | ICD-10-CM | POA: Diagnosis not present

## 2018-01-26 DIAGNOSIS — R29810 Facial weakness: Secondary | ICD-10-CM | POA: Diagnosis not present

## 2018-01-26 DIAGNOSIS — Z5181 Encounter for therapeutic drug level monitoring: Secondary | ICD-10-CM | POA: Diagnosis not present

## 2018-01-26 DIAGNOSIS — Z79899 Other long term (current) drug therapy: Secondary | ICD-10-CM | POA: Diagnosis not present

## 2018-01-26 DIAGNOSIS — H9202 Otalgia, left ear: Secondary | ICD-10-CM | POA: Diagnosis not present

## 2018-01-26 DIAGNOSIS — H5789 Other specified disorders of eye and adnexa: Secondary | ICD-10-CM | POA: Diagnosis not present

## 2018-02-20 DIAGNOSIS — R29810 Facial weakness: Secondary | ICD-10-CM | POA: Diagnosis not present

## 2018-02-20 DIAGNOSIS — Z8669 Personal history of other diseases of the nervous system and sense organs: Secondary | ICD-10-CM | POA: Diagnosis not present

## 2018-03-04 DIAGNOSIS — Z1283 Encounter for screening for malignant neoplasm of skin: Secondary | ICD-10-CM | POA: Diagnosis not present

## 2018-03-04 DIAGNOSIS — L578 Other skin changes due to chronic exposure to nonionizing radiation: Secondary | ICD-10-CM | POA: Diagnosis not present

## 2018-03-04 DIAGNOSIS — D2230 Melanocytic nevi of unspecified part of face: Secondary | ICD-10-CM | POA: Diagnosis not present

## 2018-03-04 DIAGNOSIS — L812 Freckles: Secondary | ICD-10-CM | POA: Diagnosis not present

## 2018-03-04 DIAGNOSIS — L719 Rosacea, unspecified: Secondary | ICD-10-CM | POA: Diagnosis not present

## 2018-03-04 DIAGNOSIS — L821 Other seborrheic keratosis: Secondary | ICD-10-CM | POA: Diagnosis not present

## 2018-03-04 DIAGNOSIS — Z85828 Personal history of other malignant neoplasm of skin: Secondary | ICD-10-CM | POA: Diagnosis not present

## 2018-03-04 DIAGNOSIS — D229 Melanocytic nevi, unspecified: Secondary | ICD-10-CM | POA: Diagnosis not present

## 2018-03-04 DIAGNOSIS — D225 Melanocytic nevi of trunk: Secondary | ICD-10-CM | POA: Diagnosis not present

## 2018-03-04 DIAGNOSIS — L570 Actinic keratosis: Secondary | ICD-10-CM | POA: Diagnosis not present

## 2018-08-13 DIAGNOSIS — G5602 Carpal tunnel syndrome, left upper limb: Secondary | ICD-10-CM | POA: Diagnosis not present

## 2018-08-13 DIAGNOSIS — M10342 Gout due to renal impairment, left hand: Secondary | ICD-10-CM | POA: Diagnosis not present

## 2018-09-02 DIAGNOSIS — L578 Other skin changes due to chronic exposure to nonionizing radiation: Secondary | ICD-10-CM | POA: Diagnosis not present

## 2018-09-02 DIAGNOSIS — L820 Inflamed seborrheic keratosis: Secondary | ICD-10-CM | POA: Diagnosis not present

## 2018-09-02 DIAGNOSIS — L219 Seborrheic dermatitis, unspecified: Secondary | ICD-10-CM | POA: Diagnosis not present

## 2018-09-02 DIAGNOSIS — L309 Dermatitis, unspecified: Secondary | ICD-10-CM | POA: Diagnosis not present

## 2018-09-02 DIAGNOSIS — L89329 Pressure ulcer of left buttock, unspecified stage: Secondary | ICD-10-CM | POA: Diagnosis not present

## 2018-09-02 DIAGNOSIS — L570 Actinic keratosis: Secondary | ICD-10-CM | POA: Diagnosis not present

## 2018-09-02 DIAGNOSIS — L821 Other seborrheic keratosis: Secondary | ICD-10-CM | POA: Diagnosis not present

## 2018-09-04 ENCOUNTER — Telehealth: Payer: Self-pay | Admitting: Family Medicine

## 2018-09-04 NOTE — Chronic Care Management (AMB) (Signed)
°  Chronic Care Management   Outreach Note  09/04/2018 Name: Phillip Nelson MRN: [id number] DOB: [birth year]  Referred by: Olin Hauser, DO Reason for referral : No chief complaint on file.   Third unsuccessful telephone outreach was attempted today. The patient was referred to the case management team for assistance with chronic care management and care coordination. The patient's primary care provider has been notified of our unsuccessful attempts to make or maintain contact with the patient. The care management team is pleased to engage with this patient at any time in the future should he/she be interested in assistance from the care management team.   Follow Up Plan: The care management team is available to follow up with the patient after provider conversation with the patient regarding recommendation for care management engagement and subsequent re-referral to the care management team.   [place]  ??[email or web address]   ??[phone]

## 2018-10-21 DIAGNOSIS — D485 Neoplasm of uncertain behavior of skin: Secondary | ICD-10-CM | POA: Diagnosis not present

## 2018-10-21 DIAGNOSIS — L820 Inflamed seborrheic keratosis: Secondary | ICD-10-CM | POA: Diagnosis not present

## 2018-10-30 ENCOUNTER — Other Ambulatory Visit: Payer: Medicare Other

## 2018-11-04 ENCOUNTER — Encounter: Payer: Medicare Other | Admitting: Family Medicine

## 2018-11-19 ENCOUNTER — Emergency Department: Payer: Medicare Other

## 2018-11-19 ENCOUNTER — Encounter: Payer: Self-pay | Admitting: Emergency Medicine

## 2018-11-19 ENCOUNTER — Inpatient Hospital Stay
Admission: EM | Admit: 2018-11-19 | Discharge: 2018-11-22 | DRG: 357 | Disposition: A | Discharge status: Home / Self Care | Payer: Medicare Other | Attending: Internal Medicine | Admitting: Internal Medicine

## 2018-11-19 ENCOUNTER — Telehealth: Payer: Self-pay | Admitting: Gastroenterology

## 2018-11-19 ENCOUNTER — Other Ambulatory Visit: Payer: Self-pay

## 2018-11-19 DIAGNOSIS — E669 Obesity, unspecified: Secondary | ICD-10-CM | POA: Diagnosis not present

## 2018-11-19 DIAGNOSIS — Z20828 Contact with and (suspected) exposure to other viral communicable diseases: Secondary | ICD-10-CM | POA: Diagnosis not present

## 2018-11-19 DIAGNOSIS — D62 Acute posthemorrhagic anemia: Secondary | ICD-10-CM | POA: Diagnosis not present

## 2018-11-19 DIAGNOSIS — Z8711 Personal history of peptic ulcer disease: Secondary | ICD-10-CM

## 2018-11-19 DIAGNOSIS — Z87891 Personal history of nicotine dependence: Secondary | ICD-10-CM

## 2018-11-19 DIAGNOSIS — Z85828 Personal history of other malignant neoplasm of skin: Secondary | ICD-10-CM

## 2018-11-19 DIAGNOSIS — K922 Gastrointestinal hemorrhage, unspecified: Secondary | ICD-10-CM | POA: Diagnosis present

## 2018-11-19 DIAGNOSIS — Z79899 Other long term (current) drug therapy: Secondary | ICD-10-CM

## 2018-11-19 DIAGNOSIS — K5731 Diverticulosis of large intestine without perforation or abscess with bleeding: Secondary | ICD-10-CM | POA: Diagnosis not present

## 2018-11-19 DIAGNOSIS — Z6832 Body mass index (BMI) 32.0-32.9, adult: Secondary | ICD-10-CM | POA: Diagnosis not present

## 2018-11-19 DIAGNOSIS — Z888 Allergy status to other drugs, medicaments and biological substances status: Secondary | ICD-10-CM

## 2018-11-19 DIAGNOSIS — G4733 Obstructive sleep apnea (adult) (pediatric): Secondary | ICD-10-CM | POA: Diagnosis present

## 2018-11-19 DIAGNOSIS — I2510 Atherosclerotic heart disease of native coronary artery without angina pectoris: Secondary | ICD-10-CM | POA: Diagnosis present

## 2018-11-19 DIAGNOSIS — Z820 Family history of epilepsy and other diseases of the nervous system: Secondary | ICD-10-CM

## 2018-11-19 DIAGNOSIS — M109 Gout, unspecified: Secondary | ICD-10-CM | POA: Diagnosis present

## 2018-11-19 DIAGNOSIS — Z951 Presence of aortocoronary bypass graft: Secondary | ICD-10-CM

## 2018-11-19 DIAGNOSIS — Z8546 Personal history of malignant neoplasm of prostate: Secondary | ICD-10-CM

## 2018-11-19 DIAGNOSIS — R0902 Hypoxemia: Secondary | ICD-10-CM | POA: Diagnosis not present

## 2018-11-19 DIAGNOSIS — K921 Melena: Secondary | ICD-10-CM | POA: Diagnosis not present

## 2018-11-19 DIAGNOSIS — Z96653 Presence of artificial knee joint, bilateral: Secondary | ICD-10-CM | POA: Diagnosis present

## 2018-11-19 DIAGNOSIS — K644 Residual hemorrhoidal skin tags: Secondary | ICD-10-CM | POA: Diagnosis present

## 2018-11-19 DIAGNOSIS — Z8679 Personal history of other diseases of the circulatory system: Secondary | ICD-10-CM

## 2018-11-19 DIAGNOSIS — K5730 Diverticulosis of large intestine without perforation or abscess without bleeding: Secondary | ICD-10-CM | POA: Diagnosis not present

## 2018-11-19 DIAGNOSIS — Z880 Allergy status to penicillin: Secondary | ICD-10-CM

## 2018-11-19 DIAGNOSIS — I10 Essential (primary) hypertension: Secondary | ICD-10-CM | POA: Diagnosis not present

## 2018-11-19 DIAGNOSIS — R197 Diarrhea, unspecified: Secondary | ICD-10-CM | POA: Diagnosis not present

## 2018-11-19 DIAGNOSIS — I959 Hypotension, unspecified: Secondary | ICD-10-CM | POA: Diagnosis not present

## 2018-11-19 LAB — CBC
HCT: 37.9 % — ABNORMAL LOW (ref 39.0–52.0)
Hemoglobin: 13 g/dL (ref 13.0–17.0)
MCH: 33.6 pg (ref 26.0–34.0)
MCHC: 34.3 g/dL (ref 30.0–36.0)
MCV: 97.9 fL (ref 80.0–100.0)
Platelets: 249 10*3/uL (ref 150–400)
RBC: 3.87 MIL/uL — ABNORMAL LOW (ref 4.22–5.81)
RDW: 12.6 % (ref 11.5–15.5)
WBC: 12 10*3/uL — ABNORMAL HIGH (ref 4.0–10.5)
nRBC: 0 % (ref 0.0–0.2)

## 2018-11-19 LAB — COMPREHENSIVE METABOLIC PANEL
ALT: 33 U/L (ref 0–44)
AST: 41 U/L (ref 15–41)
Albumin: 3.9 g/dL (ref 3.5–5.0)
Alkaline Phosphatase: 52 U/L (ref 38–126)
Anion gap: 9 (ref 5–15)
BUN: 23 mg/dL (ref 8–23)
CO2: 22 mmol/L (ref 22–32)
Calcium: 8.9 mg/dL (ref 8.9–10.3)
Chloride: 105 mmol/L (ref 98–111)
Creatinine, Ser: 1.16 mg/dL (ref 0.61–1.24)
GFR calc Af Amer: >60 mL/min (ref >60)
GFR calc non Af Amer: 59 mL/min — ABNORMAL LOW (ref >60)
Glucose, Bld: 114 mg/dL — ABNORMAL HIGH (ref 70–99)
Potassium: 4 mmol/L (ref 3.5–5.1)
Sodium: 136 mmol/L (ref 135–145)
Total Bilirubin: 1 mg/dL (ref 0.3–1.2)
Total Protein: 6.7 g/dL (ref 6.5–8.1)

## 2018-11-19 MED ORDER — SODIUM CHLORIDE 0.9 % IV BOLUS
1000.0000 mL | Freq: Once | INTRAVENOUS | Status: AC
  Administered 2018-11-20: 1000 mL via INTRAVENOUS

## 2018-11-19 MED ORDER — PANTOPRAZOLE SODIUM 40 MG IV SOLR: 40.0000 mg | Freq: Two times a day (BID) | INTRAVENOUS | Status: DC

## 2018-11-19 MED ORDER — SODIUM CHLORIDE 0.9 % IV SOLN
8.0000 mg/h | INTRAVENOUS | Status: DC
  Administered 2018-11-20 – 2018-11-21 (×3): 8 mg/h via INTRAVENOUS
  Filled 2018-11-19 (×3): qty 80

## 2018-11-19 NOTE — ED Provider Notes (Addendum)
[facility] Emergency Department Provider Note   ____________________________________________   First MD Initiated Contact with Patient 11/19/18 2317     (approximate)  I have reviewed the triage vital signs and the nursing notes.   HISTORY  Chief Complaint GI Bleeding    HPI Phillip Nelson is a 84 y.o. male brought to the ED from home via EMS with a chief complaint of GI bleed.  Patient has a history of PUD as well as diverticulitis who reports 6-7 episodes of black diarrhea today.  No associated abdominal pain, nausea or vomiting.  Denies use of anticoagulation.  He was in the waiting room at [facility] but left due to the long wait.  Denies fever, cough, chest pain, shortness of breath, lightheadedness.  Denies recent travel or trauma.       Past Medical History:  Diagnosis Date  . Angina at rest [facility])   . Aural vertigo   . Coronary artery disease [place]  . ED (erectile dysfunction)   . Gout, arthritis   . History of skin cancer   . Hyperlipidemia   . OSA (obstructive sleep apnea)    CPAP NON-COMPLIANT  . Prostate cancer ([place]) 05/08/12   Adenocarcinoma,gleason=3+3=6,PSA=8.26,vol=57cc    Patient Active Problem List   Diagnosis Date Noted  . GI bleed 11/20/2018  . Elevated hemoglobin A1c 10/31/2017  . Myalgia due to statin 10/29/2017  . Stable angina pectoris ([place]) 06/22/2016  . Sleep apnea 04/29/2015  . Colon polyps 03/02/2015  . Rectal bleeding 03/02/2015  . Benign essential HTN 07/20/2014  . CAD (coronary artery disease) 07/20/2014  . Hyperlipemia, mixed 07/20/2014  . OSA (obstructive sleep apnea) 07/20/2014  . Prostate cancer ([place]) 05/08/2012    Past Surgical History:  Procedure Laterality Date  . APPENDECTOMY     Life was saved with emerg. surgery.   Marland Kitchen CARDIAC SURGERY    . CARDIOVASCULAR STRESS TEST  09-07-2011  dr Saralyn Pilar   normal perfusion/ ef 57%/ no ischemia  . CARPECTOMY Right    wrist  . CATARACT EXTRACTION W/  INTRAOCULAR LENS  IMPLANT, BILATERAL    . COLONOSCOPY WITH PROPOFOL N/A 10/31/2016   Procedure: COLONOSCOPY WITH PROPOFOL;  Surgeon: Christene Lye, MD;  Location: [facility] ENDOSCOPY;  Service: Endoscopy;  Laterality: N/A;  . CORONARY ARTERY BYPASS GRAFT  JULY 2005  ([facility])   LIMA TO THE LAD  . DORSAL COMPARTMENT RELEASE Left 06/25/2017   Procedure: LEFT WRIST TENDON SHEATH RELEASE;  Surgeon: Milly Jakob, MD;  Location: [facility];  Service: Orthopedics;  Laterality: Left;  . FOOT SURGERY Bilateral    GOUT  . KNEE ARTHROSCOPY Bilateral   . LEFT ELBOW SURGERY  AGE 65  &  2007  . RADIOACTIVE SEED IMPLANT N/A 08/02/2012   Procedure: RADIOACTIVE SEED IMPLANT;  Surgeon: Claybon Jabs, MD;  Location: [facility];  Service: Urology;  Laterality: N/A;  . TOTAL KNEE ARTHROPLASTY Bilateral LEFT 2012/   RIGHT 2006  . TRANSTHORACIC ECHOCARDIOGRAM  11-24-2010   normal lvf &  lvh/ mild mr, tr, and ai    Prior to Admission medications   Medication Sig Start Date End Date Taking? Authorizing Provider  allopurinol (ZYLOPRIM) 300 MG tablet TAKE ONE (1) TABLET BY MOUTH EVERY DAY AS DIRECTED Patient taking differently: Take 300 mg by mouth daily.  12/16/15  Yes Arlis Porta., MD  meclizine (ANTIVERT) 25 MG tablet Take 25 mg by mouth 3 (three) times daily as needed for dizziness.  12/03/17  Yes [provider]  Potassium 99 MG TABS Take 396 mg by mouth daily.    Yes [provider]    Allergies Indocin [indomethacin] and Penicillins  Family History  Problem Relation Age of Onset  . Alzheimer's disease Father   . Cancer Sister 55       treated surgically    Social History Social History   Tobacco Use  . Smoking status: Former Smoker    Packs/day: 3.00    Years: 31.00    Pack years: 93.00    Quit date: 08/01/1990    Years since quitting: 28.3  . Smokeless tobacco: Never Used  Substance Use Topics  . Alcohol use: No    Comment: rarely  6  beers year,  . Drug use: No    Review of Systems  Constitutional: No fever/chills Eyes: No visual changes. ENT: No sore throat. Cardiovascular: Denies chest pain. Respiratory: Denies shortness of breath. Gastrointestinal: No abdominal pain.  No nausea, no vomiting.  Positive for black tarry loose stools.  No constipation. Genitourinary: Negative for dysuria. Musculoskeletal: Negative for back pain. Skin: Negative for rash. Neurological: Negative for headaches, focal weakness or numbness.   ____________________________________________   PHYSICAL EXAM:  VITAL SIGNS: ED Triage Vitals  Enc Vitals Group     BP 11/19/18 2257 (!) 118/53     Pulse Rate 11/19/18 2257 86     Resp 11/19/18 2257 12     Temp 11/19/18 2257 98.4 F (36.9 C)     Temp Source 11/19/18 2257 Oral     SpO2 11/19/18 2257 94 %     Weight 11/19/18 2253 215 lb (97.5 kg)     Height 11/19/18 2253 5\' 10"  (1.778 m)     Head Circumference --      Peak Flow --      Pain Score 11/19/18 2253 0     Pain Loc --      Pain Edu? --      Excl. in [place]? --     Constitutional: Alert and oriented. Well appearing and in mild acute distress. Eyes: Conjunctivae are normal. PERRL. EOMI. Head: Atraumatic. Nose: No congestion/rhinnorhea. Mouth/Throat: Mucous membranes are moist.  Oropharynx non-erythematous. Neck: No stridor.   Cardiovascular: Normal rate, regular rhythm. Grossly normal heart sounds.  Good peripheral circulation. Respiratory: Normal respiratory effort.  No retractions. Lungs CTAB. Gastrointestinal: Soft and nontender to light or deep palpation. No distention. No abdominal bruits. No CVA tenderness. Musculoskeletal: No lower extremity tenderness nor edema.  No joint effusions. Neurologic:  Normal speech and language. No gross focal neurologic deficits are appreciated.  Skin:  Skin is warm, dry and intact. No rash noted. Psychiatric: Mood and affect are normal. Speech and behavior are normal.   ____________________________________________   LABS (all labs ordered are listed, but only abnormal results are displayed)  Labs Reviewed  COMPREHENSIVE METABOLIC PANEL - Abnormal; Notable for the following components:      Result Value   Glucose, Bld 114 (*)    GFR calc non Af Amer 59 (*)    All other components within normal limits  CBC - Abnormal; Notable for the following components:   WBC 12.0 (*)    RBC 3.87 (*)    HCT 37.9 (*)    All other components within normal limits  SARS CORONAVIRUS 2 (HOSPITAL ORDER, [place] [facility])  PROTIME-INR  TSH  HEMOGLOBIN A1C  TYPE AND SCREEN   ____________________________________________  EKG  ED ECG  REPORT I, Deklen Popelka J, the attending physician, personally viewed and interpreted this ECG.   Date: 11/19/2018  EKG Time: 2259  Rate: 86  Rhythm: normal EKG, normal sinus rhythm  Axis: Normal  Intervals:none  ST&T Change: Nonspecific  ____________________________________________  RADIOLOGY  ED MD interpretation: Acute cardiopulmonary process; unremarkable CT abdomen/pelvis  Official radiology report(s): Ct Abdomen Pelvis W Contrast  Result Date: 11/20/2018 CLINICAL DATA:  84 year old male with GI bleed and diarrhea. EXAM: CT ABDOMEN AND PELVIS WITH CONTRAST TECHNIQUE: Multidetector CT imaging of the abdomen and pelvis was performed using the standard protocol following bolus administration of intravenous contrast. CONTRAST:  159mL OMNIPAQUE IOHEXOL 300 MG/ML  SOLN COMPARISON:  CT of the abdomen pelvis dated 12/29/2014 FINDINGS: Lower chest: The visualized lung bases are clear. There is coronary vascular calcification. No intra-abdominal free air or free fluid. Hepatobiliary: The fused fatty infiltration of the liver. No intrahepatic biliary ductal dilatation. The gallbladder is unremarkable. Pancreas: Unremarkable. No pancreatic ductal dilatation or surrounding inflammatory changes. Spleen: Normal in size without  focal abnormality. Adrenals/Urinary Tract: The adrenal glands are unremarkable. There is no hydronephrosis on either side. There is symmetric enhancement and excretion of contrast by both kidneys. Multiple bilateral renal cysts measure up to 4 cm in the volar aspect of the right. Additional smaller renal hypodensities are too small to characterize. The visualized ureters and urinary bladder appear unremarkable. Stomach/Bowel: There are small scattered sigmoid diverticula without active inflammatory changes. There is no bowel obstruction or active inflammation. The appendix is not visualized with certainty. No inflammatory changes identified in the right lower quadrant. Vascular/Lymphatic: Moderate aortoiliac atherosclerotic disease. There is a 2.6 cm infrarenal aortic ectasia. The IVC is unremarkable. No portal venous gas. There is no adenopathy. Reproductive: Prostate brachytherapy seeds. Other: None Musculoskeletal: Degenerative changes of the spine. No acute osseous pathology. IMPRESSION: 1. No acute intra-abdominal or pelvic pathology. No bowel obstruction or active inflammation. 2. Sigmoid diverticulosis. 3. Fatty liver. Aortic Atherosclerosis (ICD10-I70.0). Electronically Signed   By: Anner Crete M.D.   On: 11/20/2018 02:29   Dg Chest Port 1 View  Result Date: 11/19/2018 CLINICAL DATA:  GI bleed EXAM: PORTABLE CHEST 1 VIEW COMPARISON:  08/26/2016 FINDINGS: Prior CABG. Heart and mediastinal contours are within normal limits. No focal opacities or effusions. No acute bony abnormality. IMPRESSION: No active cardiopulmonary disease. Electronically Signed   By: Rolm Baptise M.D.   On: 11/19/2018 23:56    ____________________________________________   PROCEDURES  Procedure(s) performed (including Critical Care):  Procedures  Rectal exam: Small reducible nonthrombosed and nonbleeding hemorrhoid externally.  Gross hematochezia noted.  CRITICAL CARE Performed by: Paulette Blanch   Total critical  care time: 30 minutes  Critical care time was exclusive of separately billable procedures and treating other patients.  Critical care was necessary to treat or prevent imminent or life-threatening deterioration.  Critical care was time spent personally by me on the following activities: development of treatment plan with patient and/or surrogate as well as nursing, discussions with consultants, evaluation of patient's response to treatment, examination of patient, obtaining history from patient or surrogate, ordering and performing treatments and interventions, ordering and review of laboratory studies, ordering and review of radiographic studies, pulse oximetry and re-evaluation of patient's condition. ____________________________________________   INITIAL IMPRESSION / ASSESSMENT AND PLAN / ED COURSE  As part of my medical decision making, I reviewed the following data within the [place] notes reviewed and incorporated, Labs reviewed, EKG interpreted, Old chart reviewed, Radiograph reviewed, Discussed with  admitting physician Dr. Jannifer Franklin and Notes from prior ED visits     TOBIUS SONI was evaluated in Emergency Department on 11/20/2018 for the symptoms described in the history of present illness. He was evaluated in the context of the global COVID-19 pandemic, which necessitated consideration that the patient might be at risk for infection with the SARS-CoV-2 virus that causes COVID-19. Institutional protocols and algorithms that pertain to the evaluation of patients at risk for COVID-19 are in a state of rapid change based on information released by regulatory bodies including the CDC and federal and state organizations. These policies and algorithms were followed during the patient's care in the ED.   84 year old male with PUD and diverticulitis who presents with dark and tarry loose stools. Differential diagnosis includes but is not limited to PUD, AVM,  diverticulitis, etc.  Will initiate IV fluid resuscitation, Protonix bolus with drip. Initial H/H stable. Will discuss case with hospitalist Dr. Jannifer Franklin to evaluate patient in the emergency department for admission.  He has recommended CT abdomen/pelvis.      ____________________________________________   FINAL CLINICAL IMPRESSION(S) / ED DIAGNOSES  Final diagnoses:  GI bleed     ED Discharge Orders    None       Note:  This document was prepared using Dragon voice recognition software and may include unintentional dictation errors.   Paulette Blanch, MD 11/20/18 [id number]    Paulette Blanch, MD 12/01/18 [phone]

## 2018-11-19 NOTE — ED Notes (Signed)
Blue top sent to lab. 

## 2018-11-19 NOTE — ED Triage Notes (Signed)
Pt presents to ED via [facility] from home c/o GI bleed. Pt reports 6-7 episodes of diarrhea today, black in color. Pt states this has happened once before and was caused by diverticulitis. No blood thinner use.

## 2018-11-19 NOTE — Telephone Encounter (Signed)
Pt left vm wanting to schedule a apt with our office for large amount of blood in stool. I called pt back asking him to get a referral he states he doe not have a PCP and no longer sees Dr. Parks Ranger. I informed him to go to an urgent care to have symptoms evaluated

## 2018-11-20 ENCOUNTER — Emergency Department: Payer: Medicare Other

## 2018-11-20 ENCOUNTER — Inpatient Hospital Stay: Payer: Medicare Other

## 2018-11-20 DIAGNOSIS — Z6832 Body mass index (BMI) 32.0-32.9, adult: Secondary | ICD-10-CM | POA: Diagnosis not present

## 2018-11-20 DIAGNOSIS — K921 Melena: Secondary | ICD-10-CM | POA: Diagnosis not present

## 2018-11-20 DIAGNOSIS — E669 Obesity, unspecified: Secondary | ICD-10-CM | POA: Diagnosis present

## 2018-11-20 DIAGNOSIS — Z20828 Contact with and (suspected) exposure to other viral communicable diseases: Secondary | ICD-10-CM | POA: Diagnosis present

## 2018-11-20 DIAGNOSIS — Z820 Family history of epilepsy and other diseases of the nervous system: Secondary | ICD-10-CM | POA: Diagnosis not present

## 2018-11-20 DIAGNOSIS — Z8711 Personal history of peptic ulcer disease: Secondary | ICD-10-CM | POA: Diagnosis not present

## 2018-11-20 DIAGNOSIS — K922 Gastrointestinal hemorrhage, unspecified: Secondary | ICD-10-CM

## 2018-11-20 DIAGNOSIS — Z8719 Personal history of other diseases of the digestive system: Secondary | ICD-10-CM

## 2018-11-20 DIAGNOSIS — K5730 Diverticulosis of large intestine without perforation or abscess without bleeding: Secondary | ICD-10-CM | POA: Diagnosis not present

## 2018-11-20 DIAGNOSIS — Z888 Allergy status to other drugs, medicaments and biological substances status: Secondary | ICD-10-CM | POA: Diagnosis not present

## 2018-11-20 DIAGNOSIS — Z96653 Presence of artificial knee joint, bilateral: Secondary | ICD-10-CM | POA: Diagnosis present

## 2018-11-20 DIAGNOSIS — Z85828 Personal history of other malignant neoplasm of skin: Secondary | ICD-10-CM | POA: Diagnosis not present

## 2018-11-20 DIAGNOSIS — Z8679 Personal history of other diseases of the circulatory system: Secondary | ICD-10-CM | POA: Diagnosis not present

## 2018-11-20 DIAGNOSIS — Z87891 Personal history of nicotine dependence: Secondary | ICD-10-CM | POA: Diagnosis not present

## 2018-11-20 DIAGNOSIS — I2510 Atherosclerotic heart disease of native coronary artery without angina pectoris: Secondary | ICD-10-CM | POA: Diagnosis present

## 2018-11-20 DIAGNOSIS — Z951 Presence of aortocoronary bypass graft: Secondary | ICD-10-CM | POA: Diagnosis not present

## 2018-11-20 DIAGNOSIS — Z79899 Other long term (current) drug therapy: Secondary | ICD-10-CM | POA: Diagnosis not present

## 2018-11-20 DIAGNOSIS — R197 Diarrhea, unspecified: Secondary | ICD-10-CM | POA: Diagnosis not present

## 2018-11-20 DIAGNOSIS — K644 Residual hemorrhoidal skin tags: Secondary | ICD-10-CM | POA: Diagnosis present

## 2018-11-20 DIAGNOSIS — G4733 Obstructive sleep apnea (adult) (pediatric): Secondary | ICD-10-CM | POA: Diagnosis present

## 2018-11-20 DIAGNOSIS — Z8546 Personal history of malignant neoplasm of prostate: Secondary | ICD-10-CM | POA: Diagnosis not present

## 2018-11-20 DIAGNOSIS — Z880 Allergy status to penicillin: Secondary | ICD-10-CM | POA: Diagnosis not present

## 2018-11-20 DIAGNOSIS — K5731 Diverticulosis of large intestine without perforation or abscess with bleeding: Secondary | ICD-10-CM | POA: Diagnosis present

## 2018-11-20 DIAGNOSIS — M109 Gout, unspecified: Secondary | ICD-10-CM | POA: Diagnosis present

## 2018-11-20 DIAGNOSIS — D62 Acute posthemorrhagic anemia: Secondary | ICD-10-CM | POA: Diagnosis present

## 2018-11-20 HISTORY — DX: Gastrointestinal hemorrhage, unspecified: K92.2

## 2018-11-20 LAB — PROTIME-INR
INR: 1 (ref 0.8–1.2)
Prothrombin Time: 13.2 seconds (ref 11.4–15.2)

## 2018-11-20 LAB — HEMOGLOBIN
Hemoglobin: 11.8 g/dL — ABNORMAL LOW (ref 13.0–17.0)
Hemoglobin: 10.9 g/dL — ABNORMAL LOW (ref 13.0–17.0)

## 2018-11-20 LAB — TSH: TSH: 0.986 u[IU]/mL (ref 0.350–4.500)

## 2018-11-20 LAB — TYPE AND SCREEN
ABO/RH(D): O POS
Antibody Screen: NEGATIVE

## 2018-11-20 LAB — HEMOGLOBIN A1C
Hgb A1c MFr Bld: 5.7 % — ABNORMAL HIGH (ref 4.8–5.6)
Mean Plasma Glucose: 116.89 mg/dL

## 2018-11-20 LAB — SARS CORONAVIRUS 2 BY RT PCR (HOSPITAL ORDER, PERFORMED IN ~~LOC~~ HOSPITAL LAB): SARS Coronavirus 2: NEGATIVE

## 2018-11-20 MED ORDER — ONDANSETRON HCL 4 MG/2ML IJ SOLN: 4.0000 mg | Freq: Four times a day (QID) | INTRAMUSCULAR | Status: DC | PRN

## 2018-11-20 MED ORDER — MECLIZINE HCL 25 MG PO TABS
25.0000 mg | ORAL_TABLET | Freq: Three times a day (TID) | ORAL | Status: DC | PRN
  Filled 2018-11-20: qty 1

## 2018-11-20 MED ORDER — ACETAMINOPHEN 325 MG PO TABS: 650.0000 mg | ORAL_TABLET | Freq: Four times a day (QID) | ORAL | Status: DC | PRN

## 2018-11-20 MED ORDER — DOCUSATE SODIUM 100 MG PO CAPS
100.0000 mg | ORAL_CAPSULE | Freq: Two times a day (BID) | ORAL | Status: DC
  Administered 2018-11-20 – 2018-11-22 (×2): 100 mg via ORAL
  Filled 2018-11-20 (×3): qty 1

## 2018-11-20 MED ORDER — PEG 3350-KCL-NA BICARB-NACL 420 G PO SOLR
4000.0000 mL | Freq: Once | ORAL | Status: AC
  Administered 2018-11-20: 4000 mL via ORAL
  Filled 2018-11-20: qty 4000

## 2018-11-20 MED ORDER — TECHNETIUM TC 99M-LABELED RED BLOOD CELLS IV KIT: 20.0000 | PACK | Freq: Once | INTRAVENOUS | Status: DC | PRN

## 2018-11-20 MED ORDER — ONDANSETRON HCL 4 MG PO TABS: 4.0000 mg | ORAL_TABLET | Freq: Four times a day (QID) | ORAL | Status: DC | PRN

## 2018-11-20 MED ORDER — ALLOPURINOL 100 MG PO TABS
300.0000 mg | ORAL_TABLET | Freq: Every day | ORAL | Status: DC
  Administered 2018-11-20 – 2018-11-22 (×2): 300 mg via ORAL
  Filled 2018-11-20 (×2): qty 3

## 2018-11-20 MED ORDER — SODIUM CHLORIDE 0.9 % IV SOLN
80.0000 mg | Freq: Once | INTRAVENOUS | Status: AC
  Administered 2018-11-20: 80 mg via INTRAVENOUS
  Filled 2018-11-20: qty 80

## 2018-11-20 MED ORDER — ACETAMINOPHEN 650 MG RE SUPP: 650.0000 mg | Freq: Four times a day (QID) | RECTAL | Status: DC | PRN

## 2018-11-20 MED ORDER — IOHEXOL 240 MG/ML SOLN
50.0000 mL | Freq: Once | INTRAMUSCULAR | Status: AC | PRN
  Administered 2018-11-20: 01:00:00 50 mL via ORAL

## 2018-11-20 MED ORDER — IOHEXOL 300 MG/ML  SOLN
100.0000 mL | Freq: Once | INTRAMUSCULAR | Status: AC | PRN
  Administered 2018-11-20: 100 mL via INTRAVENOUS

## 2018-11-20 NOTE — Consult Note (Signed)
Phillip Antigua, MD [address] [place], [place], [place], [address] [address], [place], [place], [place], [address] Phone: [phone]  Fax: [phone]  Consultation  Referring Provider:     Dr. Bridgett Larsson Primary Care Physician:  Olin Hauser, DO Reason for Consultation:     Hematochezia  Date of Admission:  11/19/2018 Date of Consultation:  11/20/2018         HPI:   Phillip Nelson is a 84 y.o. male presents with 1 day history of bright red blood per rectum.  Started late last night with multiple episodes with bright red blood per rectum.  Reports 1 similar history of this in the past in 2008 and states it was due to diverticulosis related bleeding.  Reports aspirin use about 3 times a week, once a day as needed.  Denies any other NSAID use.  No abdominal pain.  No melena.  No nausea or vomiting.  Colonoscopy 2018 with Dr. Jamal Collin for history of polyps, with 2 subcentimeter polyps removed, extent of exam cecum, diverticulosis reported, repeat recommended in 5 years.  Colonoscopy in 2008 by Dr. Jamal Collin done for rectal bleeding, scope advanced to the transverse colon, reported with multiple large bowel diverticula with no active bleeding.  Colonoscopy 2003 with Dr. Jamal Collin done for Hemoccult positive stool, with scope advanced to transverse colon, showed diverticulosis.   Past Medical History:  Diagnosis Date  . Angina at rest [facility])   . Aural vertigo   . Coronary artery disease [place]  . ED (erectile dysfunction)   . Gout, arthritis   . History of skin cancer   . Hyperlipidemia   . OSA (obstructive sleep apnea)    CPAP NON-COMPLIANT  . Prostate cancer ([place]) 05/08/12   Adenocarcinoma,gleason=3+3=6,PSA=8.26,vol=57cc    Past Surgical History:  Procedure Laterality Date  . APPENDECTOMY     Life was saved with emerg. surgery.   Marland Kitchen CARDIAC SURGERY    . CARDIOVASCULAR STRESS TEST  09-07-2011  dr Saralyn Pilar   normal perfusion/ ef 57%/ no ischemia  .  CARPECTOMY Right    wrist  . CATARACT EXTRACTION W/ INTRAOCULAR LENS  IMPLANT, BILATERAL    . COLONOSCOPY WITH PROPOFOL N/A 10/31/2016   Procedure: COLONOSCOPY WITH PROPOFOL;  Surgeon: Christene Lye, MD;  Location: [facility] ENDOSCOPY;  Service: Endoscopy;  Laterality: N/A;  . CORONARY ARTERY BYPASS GRAFT  JULY 2005  ([facility])   LIMA TO THE LAD  . DORSAL COMPARTMENT RELEASE Left 06/25/2017   Procedure: LEFT WRIST TENDON SHEATH RELEASE;  Surgeon: Milly Jakob, MD;  Location: [place];  Service: Orthopedics;  Laterality: Left;  . FOOT SURGERY Bilateral    GOUT  . KNEE ARTHROSCOPY Bilateral   . LEFT ELBOW SURGERY  AGE 72  &  2007  . RADIOACTIVE SEED IMPLANT N/A 08/02/2012   Procedure: RADIOACTIVE SEED IMPLANT;  Surgeon: Claybon Jabs, MD;  Location: [facility];  Service: Urology;  Laterality: N/A;  . TOTAL KNEE ARTHROPLASTY Bilateral LEFT 2012/   RIGHT 2006  . TRANSTHORACIC ECHOCARDIOGRAM  11-24-2010   normal lvf &  lvh/ mild mr, tr, and ai    Prior to Admission medications   Medication Sig Start Date End Date Taking? Authorizing Provider  allopurinol (ZYLOPRIM) 300 MG tablet TAKE ONE (1) TABLET BY MOUTH EVERY DAY AS DIRECTED Patient taking differently: Take 300 mg by mouth daily.  12/16/15  Yes Arlis Porta., MD  meclizine (ANTIVERT) 25 MG tablet Take 25 mg by  mouth 3 (three) times daily as needed for dizziness.  12/03/17  Yes [provider]  Potassium 99 MG TABS Take 396 mg by mouth daily.    Yes [provider]    Family History  Problem Relation Age of Onset  . Alzheimer's disease Father   . Cancer Sister 39       treated surgically     Social History   Tobacco Use  . Smoking status: Former Smoker    Packs/day: 3.00    Years: 31.00    Pack years: 93.00    Quit date: 08/01/1990    Years since quitting: 28.3  . Smokeless tobacco: Never Used  Substance Use Topics  . Alcohol use: No    Comment: rarely  6 beers year,  .  Drug use: No    Allergies as of 11/19/2018 - Review Complete 11/19/2018  Allergen Reaction Noted  . Indocin [indomethacin] Other (See Comments) 06/05/2012  . Penicillins Itching and Rash 06/05/2012    Review of Systems:    All systems reviewed and negative except where noted in HPI.   Physical Exam:  Vital signs in last 24 hours: Vitals:   11/20/18 0100 11/20/18 0130 11/20/18 0420 11/20/18 1450  BP: 121/63 130/68 122/88 109/68  Pulse:  81 84 76  Resp: 11 11 18    Temp:   97.8 F (36.6 C) 97.8 F (36.6 C)  TempSrc:   Oral Oral  SpO2:  98% 97% 98%  Weight:   100.3 kg   Height:   5\' 10"  (1.778 m)    Last BM Date: 11/19/18 General:   Pleasant, cooperative in NAD Head:  Normocephalic and atraumatic. Eyes:   No icterus.   Conjunctiva pink. PERRLA. Ears:  Normal auditory acuity. Neck:  Supple; no masses or thyroidomegaly Lungs: Respirations even and unlabored. Lungs clear to auscultation bilaterally.   No wheezes, crackles, or rhonchi.  Abdomen:  Soft, nondistended, nontender. Normal bowel sounds. No appreciable masses or hepatomegaly.  No rebound or guarding.  Neurologic:  Alert and oriented x3;  grossly normal neurologically. Skin:  Intact without significant lesions or rashes. Cervical Nodes:  No significant cervical adenopathy. Psych:  Alert and cooperative. Normal affect.  LAB RESULTS: Recent Labs    11/19/18 2300 11/20/18 0853  WBC 12.0*  --   HGB 13.0 11.8*  HCT 37.9*  --   PLT 249  --    BMET Recent Labs    11/19/18 2300  NA 136  K 4.0  CL 105  CO2 22  GLUCOSE 114*  BUN 23  CREATININE 1.16  CALCIUM 8.9   LFT Recent Labs    11/19/18 2300  PROT 6.7  ALBUMIN 3.9  AST 41  ALT 33  ALKPHOS 52  BILITOT 1.0   PT/INR Recent Labs    11/19/18 2300  LABPROT 13.2  INR 1.0    STUDIES: Ct Abdomen Pelvis W Contrast  Result Date: 11/20/2018 CLINICAL DATA:  84 year old male with GI bleed and diarrhea. EXAM: CT ABDOMEN AND PELVIS WITH CONTRAST  TECHNIQUE: Multidetector CT imaging of the abdomen and pelvis was performed using the standard protocol following bolus administration of intravenous contrast. CONTRAST:  18mL OMNIPAQUE IOHEXOL 300 MG/ML  SOLN COMPARISON:  CT of the abdomen pelvis dated 12/29/2014 FINDINGS: Lower chest: The visualized lung bases are clear. There is coronary vascular calcification. No intra-abdominal free air or free fluid. Hepatobiliary: The fused fatty infiltration of the liver. No intrahepatic biliary ductal dilatation. The gallbladder is unremarkable. Pancreas: Unremarkable. No  pancreatic ductal dilatation or surrounding inflammatory changes. Spleen: Normal in size without focal abnormality. Adrenals/Urinary Tract: The adrenal glands are unremarkable. There is no hydronephrosis on either side. There is symmetric enhancement and excretion of contrast by both kidneys. Multiple bilateral renal cysts measure up to 4 cm in the volar aspect of the right. Additional smaller renal hypodensities are too small to characterize. The visualized ureters and urinary bladder appear unremarkable. Stomach/Bowel: There are small scattered sigmoid diverticula without active inflammatory changes. There is no bowel obstruction or active inflammation. The appendix is not visualized with certainty. No inflammatory changes identified in the right lower quadrant. Vascular/Lymphatic: Moderate aortoiliac atherosclerotic disease. There is a 2.6 cm infrarenal aortic ectasia. The IVC is unremarkable. No portal venous gas. There is no adenopathy. Reproductive: Prostate brachytherapy seeds. Other: None Musculoskeletal: Degenerative changes of the spine. No acute osseous pathology. IMPRESSION: 1. No acute intra-abdominal or pelvic pathology. No bowel obstruction or active inflammation. 2. Sigmoid diverticulosis. 3. Fatty liver. Aortic Atherosclerosis (ICD10-I70.0). Electronically Signed   By: Anner Crete M.D.   On: 11/20/2018 02:29   Dg Chest Port 1 View   Result Date: 11/19/2018 CLINICAL DATA:  GI bleed EXAM: PORTABLE CHEST 1 VIEW COMPARISON:  08/26/2016 FINDINGS: Prior CABG. Heart and mediastinal contours are within normal limits. No focal opacities or effusions. No acute bony abnormality. IMPRESSION: No active cardiopulmonary disease. Electronically Signed   By: Rolm Baptise M.D.   On: 11/19/2018 23:56      Impression / Plan:   Phillip Nelson is a 84 y.o. y/o male with hematochezia that started last night  Patient is hemodynamically stable at this time  Source of bleeding is likely diverticulosis as evidenced by hematochezia reported by patient, 2 g hemoglobin drop, no evidence of upper GI bleeding, no melena.  Discussed risks and benefits of colonoscopy for diagnostic and possible therapeutic intervention. Alternative of conservative management, and not doing any endoscopic intervention discussed in detail as well. Patient would like to proceed with the procedure.    I have discussed alternative options, risks & benefits,  which include, but are not limited to, bleeding, infection, perforation,respiratory complication & drug reaction.  The patient agrees with this plan & written consent will be obtained.  PPI IV twice daily  Continue serial CBCs and transfuse PRN Avoid NSAIDs Maintain 2 large-bore IV lines Please page GI with any acute hemodynamic changes, or signs of active GI bleeding     Thank you for involving me in the care of this patient.      LOS: 0 days   Virgel Manifold, MD  11/20/2018, 3:49 PM

## 2018-11-20 NOTE — ED Notes (Signed)
ED TO INPATIENT HANDOFF REPORT  ED Nurse Name and Phone #:   Anson Crofts Name/Age/Gender Waylan Rocher 84 y.o. male Room/Bed: ED16A/ED16A  Code Status   Code Status: Not on file  Home/SNF/Other Home Patient oriented to: self, place, time and situation Is this baseline? Yes   Triage Complete: Triage complete  Chief Complaint Rectal Bleeding  Triage Note Pt presents to ED via [facility] from home c/o GI bleed. Pt reports 6-7 episodes of diarrhea today, black in color. Pt states this has happened once before and was caused by diverticulitis. No blood thinner use.    Allergies Allergies  Allergen Reactions  . Indocin [Indomethacin] Other (See Comments)    Shakes. tremors  . Penicillins Itching, Rash and Other (See Comments)    Did it involve swelling of the face/tongue/throat, SOB, or low BP? Unknown Did it involve sudden or severe rash/hives, skin peeling, or any reaction on the inside of your mouth or nose? Unknown Did you need to seek medical attention at a hospital or doctor's office? Unknown When did it last happen?unknown If all above answers are "NO", may proceed with cephalosporin use.     Level of Care/Admitting Diagnosis ED Disposition    ED Disposition Condition [facility] Area: [place] [[id number]]  Level of Care: Med-Surg [16]  Covid Evaluation: Asymptomatic Screening Protocol (No Symptoms)  Diagnosis: GI bleed [id number]  Admitting Physician: Harrie Foreman [id number]  Attending Physician: Harrie Foreman [id number]  Estimated length of stay: past midnight tomorrow  Certification:: I certify this patient will need inpatient services for at least 2 midnights  PT Class (Do Not Modify): Inpatient [101]  PT Acc Code (Do Not Modify): Private [1]       B Medical/Surgery History Past Medical History:  Diagnosis Date  . Angina at rest [facility])   . Aural vertigo   . Coronary artery disease [place]  . ED (erectile  dysfunction)   . Gout, arthritis   . History of skin cancer   . Hyperlipidemia   . OSA (obstructive sleep apnea)    CPAP NON-COMPLIANT  . Prostate cancer ([place]) 05/08/12   Adenocarcinoma,gleason=3+3=6,PSA=8.26,vol=57cc   Past Surgical History:  Procedure Laterality Date  . APPENDECTOMY     Life was saved with emerg. surgery.   Marland Kitchen CARDIAC SURGERY    . CARDIOVASCULAR STRESS TEST  09-07-2011  dr Saralyn Pilar   normal perfusion/ ef 57%/ no ischemia  . CARPECTOMY Right    wrist  . CATARACT EXTRACTION W/ INTRAOCULAR LENS  IMPLANT, BILATERAL    . COLONOSCOPY WITH PROPOFOL N/A 10/31/2016   Procedure: COLONOSCOPY WITH PROPOFOL;  Surgeon: Christene Lye, MD;  Location: [facility] ENDOSCOPY;  Service: Endoscopy;  Laterality: N/A;  . CORONARY ARTERY BYPASS GRAFT  JULY 2005  ([facility])   LIMA TO THE LAD  . DORSAL COMPARTMENT RELEASE Left 06/25/2017   Procedure: LEFT WRIST TENDON SHEATH RELEASE;  Surgeon: Milly Jakob, MD;  Location: [facility];  Service: Orthopedics;  Laterality: Left;  . FOOT SURGERY Bilateral    GOUT  . KNEE ARTHROSCOPY Bilateral   . LEFT ELBOW SURGERY  AGE 26  &  2007  . RADIOACTIVE SEED IMPLANT N/A 08/02/2012   Procedure: RADIOACTIVE SEED IMPLANT;  Surgeon: Claybon Jabs, MD;  Location: [facility];  Service: Urology;  Laterality: N/A;  . TOTAL KNEE ARTHROPLASTY Bilateral LEFT 2012/   RIGHT 2006  . TRANSTHORACIC ECHOCARDIOGRAM  11-24-2010   normal lvf &  lvh/ mild mr, tr, and ai     A IV Location/Drains/Wounds Patient Lines/Drains/Airways Status   Active Line/Drains/Airways    Name:   Placement date:   Placement time:   Site:   Days:   Peripheral IV 11/20/18 Left Antecubital   11/20/18    0025    Antecubital   less than 1          Intake/Output Last 24 hours No intake or output data in the 24 hours ending 11/20/18 0351  Labs/Imaging Results for orders placed or performed during the hospital encounter of 11/19/18 (from the past 48 hour(s))   Comprehensive metabolic panel     Status: Abnormal   Collection Time: 11/19/18 11:00 PM  Result Value Ref Range   Sodium 136 135 - 145 mmol/L   Potassium 4.0 3.5 - 5.1 mmol/L   Chloride 105 98 - 111 mmol/L   CO2 22 22 - 32 mmol/L   Glucose, Bld 114 (H) 70 - 99 mg/dL   BUN 23 8 - 23 mg/dL   Creatinine, Ser 1.16 0.61 - 1.24 mg/dL   Calcium 8.9 8.9 - 10.3 mg/dL   Total Protein 6.7 6.5 - 8.1 g/dL   Albumin 3.9 3.5 - 5.0 g/dL   AST 41 15 - 41 U/L   ALT 33 0 - 44 U/L   Alkaline Phosphatase 52 38 - 126 U/L   Total Bilirubin 1.0 0.3 - 1.2 mg/dL   GFR calc non Af Amer 59 (L) >60 mL/min   GFR calc Af Amer >60 >60 mL/min   Anion gap 9 5 - 15    Comment: Performed at [facility], [address] [place], [place] [address]  CBC     Status: Abnormal   Collection Time: 11/19/18 11:00 PM  Result Value Ref Range   WBC 12.0 (H) 4.0 - 10.5 K/uL   RBC 3.87 (L) 4.22 - 5.81 MIL/uL   Hemoglobin 13.0 13.0 - 17.0 g/dL   HCT 37.9 (L) 39.0 - 52.0 %   MCV 97.9 80.0 - 100.0 fL   MCH 33.6 26.0 - 34.0 pg   MCHC 34.3 30.0 - 36.0 g/dL   RDW 12.6 11.5 - 15.5 %   Platelets 249 150 - 400 K/uL   nRBC 0.0 0.0 - 0.2 %    Comment: Performed at [facility], [address] [place], [place] [address]  Type and screen [place]     Status: None   Collection Time: 11/19/18 11:00 PM  Result Value Ref Range   ABO/RH(D) O POS    Antibody Screen NEG    Sample Expiration      11/22/2018,2359 Performed at [facility], [address] [place], [place] [address]   Protime-INR     Status: None   Collection Time: 11/19/18 11:00 PM  Result Value Ref Range   Prothrombin Time 13.2 11.4 - 15.2 seconds   INR 1.0 0.8 - 1.2    Comment: (NOTE) INR goal varies based on device and disease states. Performed at [facility], [address] [place], [place] [address]   SARS Coronavirus 2 [facility] order, Performed in [facility] hospital lab)     Status: None    Collection Time: 11/19/18 11:32 PM  Result Value Ref Range   SARS Coronavirus 2 NEGATIVE NEGATIVE    Comment: (NOTE) If result is NEGATIVE SARS-CoV-2 target nucleic acids are NOT DETECTED. The SARS-CoV-2 RNA is generally detectable in upper and lower  respiratory specimens  during the acute phase of infection. The lowest  concentration of SARS-CoV-2 viral copies this assay can detect is 250  copies / mL. A negative result does not preclude SARS-CoV-2 infection  and should not be used as the sole basis for treatment or other  patient management decisions.  A negative result may occur with  improper specimen collection / handling, submission of specimen other  than nasopharyngeal swab, presence of viral mutation(s) within the  areas targeted by this assay, and inadequate number of viral copies  (<250 copies / mL). A negative result must be combined with clinical  observations, patient history, and epidemiological information. If result is POSITIVE SARS-CoV-2 target nucleic acids are DETECTED. The SARS-CoV-2 RNA is generally detectable in upper and lower  respiratory specimens dur ing the acute phase of infection.  Positive  results are indicative of active infection with SARS-CoV-2.  Clinical  correlation with patient history and other diagnostic information is  necessary to determine patient infection status.  Positive results do  not rule out bacterial infection or co-infection with other viruses. If result is PRESUMPTIVE POSTIVE SARS-CoV-2 nucleic acids MAY BE PRESENT.   A presumptive positive result was obtained on the submitted specimen  and confirmed on repeat testing.  While 2019 novel coronavirus  (SARS-CoV-2) nucleic acids may be present in the submitted sample  additional confirmatory testing may be necessary for epidemiological  and / or clinical management purposes  to differentiate between  SARS-CoV-2 and other Sarbecovirus currently known to infect humans.  If  clinically indicated additional testing with an alternate test  methodology [phone]) is advised. The SARS-CoV-2 RNA is generally  detectable in upper and lower respiratory sp ecimens during the acute  phase of infection. The expected result is Negative. Fact Sheet for Patients:  StrictlyIdeas.no Fact Sheet for Healthcare Providers: BankingDealers.co.za This test is not yet approved or cleared by the [place] FDA and has been authorized for detection and/or diagnosis of SARS-CoV-2 by FDA under an Emergency Use Authorization (EUA).  This EUA will remain in effect (meaning this test can be used) for the duration of the COVID-19 declaration under Section 564(b)(1) of the Act, 21 U.S.C. section 360bbb-3(b)(1), unless the authorization is terminated or revoked sooner. Performed at [facility], [place], [place] [address]    Ct Abdomen Pelvis W Contrast  Result Date: 11/20/2018 CLINICAL DATA:  84 year old male with GI bleed and diarrhea. EXAM: CT ABDOMEN AND PELVIS WITH CONTRAST TECHNIQUE: Multidetector CT imaging of the abdomen and pelvis was performed using the standard protocol following bolus administration of intravenous contrast. CONTRAST:  167mL OMNIPAQUE IOHEXOL 300 MG/ML  SOLN COMPARISON:  CT of the abdomen pelvis dated 12/29/2014 FINDINGS: Lower chest: The visualized lung bases are clear. There is coronary vascular calcification. No intra-abdominal free air or free fluid. Hepatobiliary: The fused fatty infiltration of the liver. No intrahepatic biliary ductal dilatation. The gallbladder is unremarkable. Pancreas: Unremarkable. No pancreatic ductal dilatation or surrounding inflammatory changes. Spleen: Normal in size without focal abnormality. Adrenals/Urinary Tract: The adrenal glands are unremarkable. There is no hydronephrosis on either side. There is symmetric enhancement and excretion of contrast by both  kidneys. Multiple bilateral renal cysts measure up to 4 cm in the volar aspect of the right. Additional smaller renal hypodensities are too small to characterize. The visualized ureters and urinary bladder appear unremarkable. Stomach/Bowel: There are small scattered sigmoid diverticula without active inflammatory changes. There is no bowel obstruction or active inflammation. The appendix is not visualized  with certainty. No inflammatory changes identified in the right lower quadrant. Vascular/Lymphatic: Moderate aortoiliac atherosclerotic disease. There is a 2.6 cm infrarenal aortic ectasia. The IVC is unremarkable. No portal venous gas. There is no adenopathy. Reproductive: Prostate brachytherapy seeds. Other: None Musculoskeletal: Degenerative changes of the spine. No acute osseous pathology. IMPRESSION: 1. No acute intra-abdominal or pelvic pathology. No bowel obstruction or active inflammation. 2. Sigmoid diverticulosis. 3. Fatty liver. Aortic Atherosclerosis (ICD10-I70.0). Electronically Signed   By: Anner Crete M.D.   On: 11/20/2018 02:29   Dg Chest Port 1 View  Result Date: 11/19/2018 CLINICAL DATA:  GI bleed EXAM: PORTABLE CHEST 1 VIEW COMPARISON:  08/26/2016 FINDINGS: Prior CABG. Heart and mediastinal contours are within normal limits. No focal opacities or effusions. No acute bony abnormality. IMPRESSION: No active cardiopulmonary disease. Electronically Signed   By: Rolm Baptise M.D.   On: 11/19/2018 23:56    Pending Labs [employer] (From admission, onward)    Start     Ordered   Signed and Held  TSH  Add-on,   R     Signed and Held   Signed and Held  Hemoglobin A1c  Add-on,   R     Signed and Held          Vitals/Pain Today's Vitals   11/20/18 0000 11/20/18 0030 11/20/18 0100 11/20/18 0130  BP: (!) 103/53 125/61 121/63 130/68  Pulse:    81  Resp: 16 (!) 3 11 11   Temp:      TempSrc:      SpO2:    98%  Weight:      Height:      PainSc:        Isolation  Precautions No active isolations  Medications Medications  pantoprazole (PROTONIX) 80 mg in sodium chloride 0.9 % 250 mL (0.32 mg/mL) infusion (has no administration in time range)  pantoprazole (PROTONIX) injection 40 mg (40 mg Intravenous Not Given 11/20/18 0014)  sodium chloride 0.9 % bolus 1,000 mL (1,000 mLs Intravenous New Bag/Given 11/20/18 0014)  pantoprazole (PROTONIX) 80 mg in sodium chloride 0.9 % 100 mL IVPB (0 mg Intravenous Stopped 11/20/18 0144)  iohexol (OMNIPAQUE) 240 MG/ML injection 50 mL (50 mLs Oral Contrast Given 11/20/18 0038)  iohexol (OMNIPAQUE) 300 MG/ML solution 100 mL (100 mLs Intravenous Contrast Given 11/20/18 0150)    Mobility walks Low fall risk   Focused Assessments GI Bleed   R Recommendations: See Admitting Provider Note  Report given to:   Additional Notes:

## 2018-11-20 NOTE — Progress Notes (Addendum)
Examined patient, vital signs and labs reviewed. The patient has no melena or bloody stool this am. Just now he has dark bloody stool. Continue current treatment, follow-up with GI consult. Per Dr. Bonna Gains, prep for endoscopy tomorrow. If Hb significantly drop or hemodynamic changes, RBC scan RBC tonight. Discussed with patient and RN.

## 2018-11-20 NOTE — Progress Notes (Signed)
Notified Drs. Chen and [place] of episode of dark red stool.

## 2018-11-20 NOTE — H&P (Signed)
Phillip Nelson is an 84 y.o. male.   Chief Complaint: Blood in stool HPI: The patient with past medical history of gout and a spontaneous coronary artery dissection presents to the emergency department complaining of blood in his stool.  The patient reports having a melanotic stool approximately 12 hours prior to admission.  He subsequently had 9 grossly bloody stools in total prior to admission.  He admits to feeling lightheaded after 1 of his bowel movements at home.  Otherwise he feels well.  The patient's bleeding has been painless and he denies nausea or vomiting.  IV Protonix was started in the emergency department prior to the hospitalist service being called for admission.  Past Medical History:  Diagnosis Date  . Angina at rest [facility])   . Aural vertigo   . Coronary artery disease [place]  . ED (erectile dysfunction)   . Gout, arthritis   . History of skin cancer   . Hyperlipidemia   . OSA (obstructive sleep apnea)    CPAP NON-COMPLIANT  . Prostate cancer ([place]) 05/08/12   Adenocarcinoma,gleason=3+3=6,PSA=8.26,vol=57cc    Past Surgical History:  Procedure Laterality Date  . APPENDECTOMY     Life was saved with emerg. surgery.   Marland Kitchen CARDIAC SURGERY    . CARDIOVASCULAR STRESS TEST  09-07-2011  dr Saralyn Pilar   normal perfusion/ ef 57%/ no ischemia  . CARPECTOMY Right    wrist  . CATARACT EXTRACTION W/ INTRAOCULAR LENS  IMPLANT, BILATERAL    . COLONOSCOPY WITH PROPOFOL N/A 10/31/2016   Procedure: COLONOSCOPY WITH PROPOFOL;  Surgeon: Christene Lye, MD;  Location: [facility] ENDOSCOPY;  Service: Endoscopy;  Laterality: N/A;  . CORONARY ARTERY BYPASS GRAFT  JULY 2005  ([facility])   LIMA TO THE LAD  . DORSAL COMPARTMENT RELEASE Left 06/25/2017   Procedure: LEFT WRIST TENDON SHEATH RELEASE;  Surgeon: Milly Jakob, MD;  Location: [place];  Service: Orthopedics;  Laterality: Left;  . FOOT SURGERY Bilateral    GOUT  . KNEE ARTHROSCOPY Bilateral   . LEFT ELBOW  SURGERY  AGE 84  &  2007  . RADIOACTIVE SEED IMPLANT N/A 08/02/2012   Procedure: RADIOACTIVE SEED IMPLANT;  Surgeon: Claybon Jabs, MD;  Location: [facility];  Service: Urology;  Laterality: N/A;  . TOTAL KNEE ARTHROPLASTY Bilateral LEFT 2012/   RIGHT 2006  . TRANSTHORACIC ECHOCARDIOGRAM  11-24-2010   normal lvf &  lvh/ mild mr, tr, and ai    Family History  Problem Relation Age of Onset  . Alzheimer's disease Father   . Cancer Sister 26       treated surgically   Social History:  reports that he quit smoking about 28 years ago. He has a 93.00 pack-year smoking history. He has never used smokeless tobacco. He reports that he does not drink alcohol or use drugs.  Allergies:  Allergies  Allergen Reactions  . Indocin [Indomethacin] Other (See Comments)    Shakes. tremors  . Penicillins Itching, Rash and Other (See Comments)    Did it involve swelling of the face/tongue/throat, SOB, or low BP? Unknown Did it involve sudden or severe rash/hives, skin peeling, or any reaction on the inside of your mouth or nose? Unknown Did you need to seek medical attention at a hospital or doctor's office? Unknown When did it last happen?unknown If all above answers are "NO", may proceed with cephalosporin use.     Medications Prior to Admission  Medication Sig Dispense Refill  . allopurinol (ZYLOPRIM)  300 MG tablet TAKE ONE (1) TABLET BY MOUTH EVERY DAY AS DIRECTED (Patient taking differently: Take 300 mg by mouth daily. ) 30 tablet 3  . meclizine (ANTIVERT) 25 MG tablet Take 25 mg by mouth 3 (three) times daily as needed for dizziness.     . Potassium 99 MG TABS Take 396 mg by mouth daily.       Results for orders placed or performed during the hospital encounter of 11/19/18 (from the past 48 hour(s))  Comprehensive metabolic panel     Status: Abnormal   Collection Time: 11/19/18 11:00 PM  Result Value Ref Range   Sodium 136 135 - 145 mmol/L   Potassium 4.0 3.5 - 5.1 mmol/L    Chloride 105 98 - 111 mmol/L   CO2 22 22 - 32 mmol/L   Glucose, Bld 114 (H) 70 - 99 mg/dL   BUN 23 8 - 23 mg/dL   Creatinine, Ser 1.16 0.61 - 1.24 mg/dL   Calcium 8.9 8.9 - 10.3 mg/dL   Total Protein 6.7 6.5 - 8.1 g/dL   Albumin 3.9 3.5 - 5.0 g/dL   AST 41 15 - 41 U/L   ALT 33 0 - 44 U/L   Alkaline Phosphatase 52 38 - 126 U/L   Total Bilirubin 1.0 0.3 - 1.2 mg/dL   GFR calc non Af Amer 59 (L) >60 mL/min   GFR calc Af Amer >60 >60 mL/min   Anion gap 9 5 - 15    Comment: Performed at [facility], [address] [place], [place] [address]  CBC     Status: Abnormal   Collection Time: 11/19/18 11:00 PM  Result Value Ref Range   WBC 12.0 (H) 4.0 - 10.5 K/uL   RBC 3.87 (L) 4.22 - 5.81 MIL/uL   Hemoglobin 13.0 13.0 - 17.0 g/dL   HCT 37.9 (L) 39.0 - 52.0 %   MCV 97.9 80.0 - 100.0 fL   MCH 33.6 26.0 - 34.0 pg   MCHC 34.3 30.0 - 36.0 g/dL   RDW 12.6 11.5 - 15.5 %   Platelets 249 150 - 400 K/uL   nRBC 0.0 0.0 - 0.2 %    Comment: Performed at [facility], [address] [place], [place] [address]  Type and screen [place]     Status: None   Collection Time: 11/19/18 11:00 PM  Result Value Ref Range   ABO/RH(D) O POS    Antibody Screen NEG    Sample Expiration      11/22/2018,2359 Performed at [facility], [address] [place], [place] [address]   Protime-INR     Status: None   Collection Time: 11/19/18 11:00 PM  Result Value Ref Range   Prothrombin Time 13.2 11.4 - 15.2 seconds   INR 1.0 0.8 - 1.2    Comment: (NOTE) INR goal varies based on device and disease states. Performed at [facility], [address] [place], [place] [address]   SARS Coronavirus 2 [facility] order, Performed in [facility] hospital lab)     Status: None   Collection Time: 11/19/18 11:32 PM  Result Value Ref Range   SARS Coronavirus 2 NEGATIVE NEGATIVE    Comment: (NOTE) If result is NEGATIVE SARS-CoV-2 target nucleic acids are  NOT DETECTED. The SARS-CoV-2 RNA is generally detectable in upper and lower  respiratory specimens during the acute phase of infection. The lowest  concentration of SARS-CoV-2 viral copies this assay can detect is 250  copies / mL. A negative result does not preclude SARS-CoV-2 infection  and should not be used as the sole basis for treatment or other  patient management decisions.  A negative result may occur with  improper specimen collection / handling, submission of specimen other  than nasopharyngeal swab, presence of viral mutation(s) within the  areas targeted by this assay, and inadequate number of viral copies  (<250 copies / mL). A negative result must be combined with clinical  observations, patient history, and epidemiological information. If result is POSITIVE SARS-CoV-2 target nucleic acids are DETECTED. The SARS-CoV-2 RNA is generally detectable in upper and lower  respiratory specimens dur ing the acute phase of infection.  Positive  results are indicative of active infection with SARS-CoV-2.  Clinical  correlation with patient history and other diagnostic information is  necessary to determine patient infection status.  Positive results do  not rule out bacterial infection or co-infection with other viruses. If result is PRESUMPTIVE POSTIVE SARS-CoV-2 nucleic acids MAY BE PRESENT.   A presumptive positive result was obtained on the submitted specimen  and confirmed on repeat testing.  While 2019 novel coronavirus  (SARS-CoV-2) nucleic acids may be present in the submitted sample  additional confirmatory testing may be necessary for epidemiological  and / or clinical management purposes  to differentiate between  SARS-CoV-2 and other Sarbecovirus currently known to infect humans.  If clinically indicated additional testing with an alternate test  methodology [phone]) is advised. The SARS-CoV-2 RNA is generally  detectable in upper and lower respiratory sp ecimens  during the acute  phase of infection. The expected result is Negative. Fact Sheet for Patients:  StrictlyIdeas.no Fact Sheet for Healthcare Providers: BankingDealers.co.za This test is not yet approved or cleared by the [place] FDA and has been authorized for detection and/or diagnosis of SARS-CoV-2 by FDA under an Emergency Use Authorization (EUA).  This EUA will remain in effect (meaning this test can be used) for the duration of the COVID-19 declaration under Section 564(b)(1) of the Act, 21 U.S.C. section 360bbb-3(b)(1), unless the authorization is terminated or revoked sooner. Performed at [facility], [place], [place] [address]    Ct Abdomen Pelvis W Contrast  Result Date: 11/20/2018 CLINICAL DATA:  84 year old male with GI bleed and diarrhea. EXAM: CT ABDOMEN AND PELVIS WITH CONTRAST TECHNIQUE: Multidetector CT imaging of the abdomen and pelvis was performed using the standard protocol following bolus administration of intravenous contrast. CONTRAST:  154mL OMNIPAQUE IOHEXOL 300 MG/ML  SOLN COMPARISON:  CT of the abdomen pelvis dated 12/29/2014 FINDINGS: Lower chest: The visualized lung bases are clear. There is coronary vascular calcification. No intra-abdominal free air or free fluid. Hepatobiliary: The fused fatty infiltration of the liver. No intrahepatic biliary ductal dilatation. The gallbladder is unremarkable. Pancreas: Unremarkable. No pancreatic ductal dilatation or surrounding inflammatory changes. Spleen: Normal in size without focal abnormality. Adrenals/Urinary Tract: The adrenal glands are unremarkable. There is no hydronephrosis on either side. There is symmetric enhancement and excretion of contrast by both kidneys. Multiple bilateral renal cysts measure up to 4 cm in the volar aspect of the right. Additional smaller renal hypodensities are too small to characterize. The visualized ureters and  urinary bladder appear unremarkable. Stomach/Bowel: There are small scattered sigmoid diverticula without active inflammatory changes. There is no bowel obstruction or active inflammation. The appendix is not visualized with certainty. No inflammatory changes identified in the right lower quadrant. Vascular/Lymphatic: Moderate aortoiliac atherosclerotic disease. There is a 2.6 cm  infrarenal aortic ectasia. The IVC is unremarkable. No portal venous gas. There is no adenopathy. Reproductive: Prostate brachytherapy seeds. Other: None Musculoskeletal: Degenerative changes of the spine. No acute osseous pathology. IMPRESSION: 1. No acute intra-abdominal or pelvic pathology. No bowel obstruction or active inflammation. 2. Sigmoid diverticulosis. 3. Fatty liver. Aortic Atherosclerosis (ICD10-I70.0). Electronically Signed   By: Anner Crete M.D.   On: 11/20/2018 02:29   Dg Chest Port 1 View  Result Date: 11/19/2018 CLINICAL DATA:  GI bleed EXAM: PORTABLE CHEST 1 VIEW COMPARISON:  08/26/2016 FINDINGS: Prior CABG. Heart and mediastinal contours are within normal limits. No focal opacities or effusions. No acute bony abnormality. IMPRESSION: No active cardiopulmonary disease. Electronically Signed   By: Rolm Baptise M.D.   On: 11/19/2018 23:56    Review of Systems  Constitutional: Negative for chills and fever.  HENT: Negative for sore throat and tinnitus.   Eyes: Negative for blurred vision and redness.  Respiratory: Negative for cough and shortness of breath.   Cardiovascular: Negative for chest pain, palpitations, orthopnea and PND.  Gastrointestinal: Positive for blood in stool. Negative for abdominal pain, diarrhea, nausea and vomiting.  Genitourinary: Negative for dysuria, frequency and urgency.  Musculoskeletal: Negative for joint pain and myalgias.  Skin: Negative for rash.       No lesions  Neurological: Negative for speech change, focal weakness and weakness.  Endo/Heme/Allergies: Does not  bruise/bleed easily.       No temperature intolerance  Psychiatric/Behavioral: Negative for depression and suicidal ideas.    Blood pressure 122/88, pulse 84, temperature 97.8 F (36.6 C), temperature source Oral, resp. rate 18, height 5\' 10"  (1.778 m), weight 97.5 kg, SpO2 97 %. Physical Exam  Vitals reviewed. Constitutional: He is oriented to person, place, and time. He appears well-developed and well-nourished. No distress.  HENT:  Head: Normocephalic and atraumatic.  Mouth/Throat: Oropharynx is clear and moist.  Eyes: Pupils are equal, round, and reactive to light. Conjunctivae and EOM are normal. No scleral icterus.  Neck: Normal range of motion. Neck supple. No JVD present. No tracheal deviation present. No thyromegaly present.  Cardiovascular: Normal rate and regular rhythm. Exam reveals no gallop and no friction rub.  No murmur heard. Respiratory: Effort normal and breath sounds normal. No respiratory distress.  GI: Soft. Bowel sounds are normal. He exhibits no distension. There is no abdominal tenderness.  Genitourinary:    Genitourinary Comments: Deferred   Musculoskeletal: Normal range of motion.        General: No edema.  Lymphadenopathy:    He has no cervical adenopathy.  Neurological: He is alert and oriented to person, place, and time. No cranial nerve deficit.  Skin: Skin is warm and dry. No rash noted. No erythema.  Psychiatric: He has a normal mood and affect. His behavior is normal. Judgment and thought content normal.     Assessment/Plan This is an 84 year old male admitted for GI bleeding. 1.  GI bleed: The patient has had some melena but also hematochezia.  He is hemodynamically stable.  Recheck hematocrit soon.  Hydrate with intravenous fluid.  Maintain 2 large-bore IVs.  IV Protonix.  The patient is n.p.o. in anticipation of endoscopy.  Consult gastroenterology. 2.  Gout: Stable; continue allopurinol 3.  History of coronary dissection: The patient underwent  cardiac surgery in 2005.  Heart catheterization showed no significant coronary artery disease. 4.  Obesity: BMI is 30.8; encourage healthy diet and exercise 5.  DVT prophylaxis: SCDs 6.  GI prophylaxis: As above The patient  is a full code.  Time spent on admission orders and patient care approximately 45 minutes  Harrie Foreman, MD 11/20/2018, 6:10 AM

## 2018-11-21 ENCOUNTER — Encounter
Admission: EM | Disposition: A | Discharge status: Home / Self Care | Payer: Self-pay | Source: Home / Self Care | Attending: Internal Medicine

## 2018-11-21 ENCOUNTER — Encounter: Payer: Self-pay | Admitting: Anesthesiology

## 2018-11-21 ENCOUNTER — Encounter: Payer: Self-pay | Admitting: Registered Nurse

## 2018-11-21 ENCOUNTER — Other Ambulatory Visit (INDEPENDENT_AMBULATORY_CARE_PROVIDER_SITE_OTHER): Payer: Self-pay | Admitting: Vascular Surgery

## 2018-11-21 DIAGNOSIS — K922 Gastrointestinal hemorrhage, unspecified: Secondary | ICD-10-CM

## 2018-11-21 HISTORY — PX: EMBOLIZATION: CATH118239

## 2018-11-21 HISTORY — PX: EMBOLIZATION (CATH LAB): CATH118239

## 2018-11-21 LAB — HEMOGLOBIN: Hemoglobin: 9.4 g/dL — ABNORMAL LOW (ref 13.0–17.0)

## 2018-11-21 LAB — BASIC METABOLIC PANEL
Anion gap: 8 (ref 5–15)
BUN: 15 mg/dL (ref 8–23)
CO2: 24 mmol/L (ref 22–32)
Calcium: 8.1 mg/dL — ABNORMAL LOW (ref 8.9–10.3)
Chloride: 108 mmol/L (ref 98–111)
Creatinine, Ser: 0.98 mg/dL (ref 0.61–1.24)
GFR calc Af Amer: >60 mL/min (ref >60)
GFR calc non Af Amer: >60 mL/min (ref >60)
Glucose, Bld: 97 mg/dL (ref 70–99)
Potassium: 4.2 mmol/L (ref 3.5–5.1)
Sodium: 140 mmol/L (ref 135–145)

## 2018-11-21 LAB — HEMOGLOBIN AND HEMATOCRIT, BLOOD
HCT: 29.2 % — ABNORMAL LOW (ref 39.0–52.0)
Hemoglobin: 9.8 g/dL — ABNORMAL LOW (ref 13.0–17.0)

## 2018-11-21 SURGERY — COLONOSCOPY
Anesthesia: Monitor Anesthesia Care | Laterality: Left

## 2018-11-21 SURGERY — EMBOLIZATION
Anesthesia: Moderate Sedation

## 2018-11-21 MED ORDER — SODIUM CHLORIDE 0.9% FLUSH: 3.0000 mL | INTRAVENOUS | Status: DC | PRN

## 2018-11-21 MED ORDER — IODIXANOL 320 MG/ML IV SOLN
INTRAVENOUS | Status: DC | PRN
  Administered 2018-11-21: 13:00:00 45 mL via INTRAVENOUS

## 2018-11-21 MED ORDER — METHYLPREDNISOLONE SODIUM SUCC 125 MG IJ SOLR: 125.0000 mg | Freq: Once | INTRAMUSCULAR | Status: DC | PRN

## 2018-11-21 MED ORDER — CLINDAMYCIN PHOSPHATE 300 MG/50ML IV SOLN
INTRAVENOUS | Status: AC
  Administered 2018-11-21: 300 mg via INTRAVENOUS
  Filled 2018-11-21: qty 50

## 2018-11-21 MED ORDER — FENTANYL CITRATE (PF) 100 MCG/2ML IJ SOLN
INTRAMUSCULAR | Status: DC | PRN
  Administered 2018-11-21: 50 ug via INTRAVENOUS
  Administered 2018-11-21: 25 ug via INTRAVENOUS

## 2018-11-21 MED ORDER — FENTANYL CITRATE (PF) 100 MCG/2ML IJ SOLN
INTRAMUSCULAR | Status: AC
  Filled 2018-11-21: qty 2

## 2018-11-21 MED ORDER — ONDANSETRON HCL 4 MG/2ML IJ SOLN: 4.0000 mg | Freq: Four times a day (QID) | INTRAMUSCULAR | Status: DC | PRN

## 2018-11-21 MED ORDER — MIDAZOLAM HCL 5 MG/5ML IJ SOLN
INTRAMUSCULAR | Status: AC
  Filled 2018-11-21: qty 5

## 2018-11-21 MED ORDER — SODIUM CHLORIDE 0.9 % IV SOLN
INTRAVENOUS | Status: DC
  Administered 2018-11-21 – 2018-11-22 (×3): via INTRAVENOUS

## 2018-11-21 MED ORDER — SODIUM CHLORIDE 0.9 % IV SOLN: INTRAVENOUS | Status: DC

## 2018-11-21 MED ORDER — SODIUM CHLORIDE 0.9 % IV SOLN: INTRAVENOUS | Status: AC

## 2018-11-21 MED ORDER — FAMOTIDINE 20 MG PO TABS: 40.0000 mg | ORAL_TABLET | Freq: Once | ORAL | Status: DC | PRN

## 2018-11-21 MED ORDER — MIDAZOLAM HCL 2 MG/ML PO SYRP: 8.0000 mg | ORAL_SOLUTION | Freq: Once | ORAL | Status: DC | PRN

## 2018-11-21 MED ORDER — SODIUM CHLORIDE 0.9% FLUSH
3.0000 mL | Freq: Two times a day (BID) | INTRAVENOUS | Status: DC
  Administered 2018-11-21: 3 mL via INTRAVENOUS

## 2018-11-21 MED ORDER — SODIUM CHLORIDE 0.9 % IV SOLN: 250.0000 mL | INTRAVENOUS | Status: DC | PRN

## 2018-11-21 MED ORDER — CLINDAMYCIN PHOSPHATE 300 MG/50ML IV SOLN
300.0000 mg | Freq: Once | INTRAVENOUS | Status: AC
  Administered 2018-11-21: 13:00:00 300 mg via INTRAVENOUS

## 2018-11-21 MED ORDER — MIDAZOLAM HCL 2 MG/2ML IJ SOLN
INTRAMUSCULAR | Status: DC | PRN
  Administered 2018-11-21: 1 mg via INTRAVENOUS
  Administered 2018-11-21: 2 mg via INTRAVENOUS

## 2018-11-21 MED ORDER — HYDROMORPHONE HCL 1 MG/ML IJ SOLN: 1.0000 mg | Freq: Once | INTRAMUSCULAR | Status: DC | PRN

## 2018-11-21 MED ORDER — DIPHENHYDRAMINE HCL 50 MG/ML IJ SOLN: 50.0000 mg | Freq: Once | INTRAMUSCULAR | Status: DC | PRN

## 2018-11-21 SURGICAL SUPPLY — 17 items
BLOCK BEAD 500-700 (Vascular Products) ×1 IMPLANT
CATH ANGIO 5F PIGTAIL 65CM (CATHETERS) ×1 IMPLANT
CATH MICROCATH PRGRT 2.8F 110 (CATHETERS) IMPLANT
CATH VS15FR (CATHETERS) ×1 IMPLANT
DEVICE PRESTO INFLATION (MISCELLANEOUS) ×2 IMPLANT
DEVICE STARCLOSE SE CLOSURE (Vascular Products) ×1 IMPLANT
DEVICE TORQUE .025-.038 (MISCELLANEOUS) ×1 IMPLANT
GUIDEWIRE ANGLED .035 180CM (WIRE) ×1 IMPLANT
GUIDEWIRE PFTE-COATED .018X300 (WIRE) ×1 IMPLANT
MICROCATH PROGREAT 2.8F 110 CM (CATHETERS) ×2
NDL ENTRY 21GA 7CM ECHOTIP (NEEDLE) IMPLANT
NEEDLE ENTRY 21GA 7CM ECHOTIP (NEEDLE) ×2 IMPLANT
SET INTRO CAPELLA COAXIAL (SET/KITS/TRAYS/PACK) ×1 IMPLANT
SHEATH BRITE TIP 5FRX11 (SHEATH) ×1 IMPLANT
SYR MEDRAD MARK 7 150ML (SYRINGE) ×1 IMPLANT
TUBING CONTRAST HIGH PRESS 72 (TUBING) ×1 IMPLANT
WIRE J 3MM .035X145CM (WIRE) ×1 IMPLANT

## 2018-11-21 NOTE — Progress Notes (Signed)
[facility] [place] NAME: Phillip Nelson    MR#:  [id number]  DATE OF BIRTH:  1934/08/16  SUBJECTIVE:  CHIEF COMPLAINT:   Chief Complaint  Patient presents with  . GI Bleeding  Came with multiple dark tarry stools.  No abdominal pain.  Hemoglobin had dropped significantly. He had bleeding scan done last night which was positive for active bleed.  REVIEW OF SYSTEMS:  CONSTITUTIONAL: No fever, fatigue or weakness.  EYES: No blurred or double vision.  EARS, NOSE, AND THROAT: No tinnitus or ear pain.  RESPIRATORY: No cough, shortness of breath, wheezing or hemoptysis.  CARDIOVASCULAR: No chest pain, orthopnea, edema.  GASTROINTESTINAL: No nausea, vomiting, diarrhea or abdominal pain.  GENITOURINARY: No dysuria, hematuria.  ENDOCRINE: No polyuria, nocturia,  HEMATOLOGY: No anemia, easy bruising or bleeding SKIN: No rash or lesion. MUSCULOSKELETAL: No joint pain or arthritis.   NEUROLOGIC: No tingling, numbness, weakness.  PSYCHIATRY: No anxiety or depression.   ROS  DRUG ALLERGIES:   Allergies  Allergen Reactions  . Indocin [Indomethacin] Other (See Comments)    Shakes. tremors  . Penicillins Itching, Rash and Other (See Comments)    Did it involve swelling of the face/tongue/throat, SOB, or low BP? Unknown Did it involve sudden or severe rash/hives, skin peeling, or any reaction on the inside of your mouth or nose? Unknown Did you need to seek medical attention at a hospital or doctor's office? Unknown When did it last happen?unknown If all above answers are "NO", may proceed with cephalosporin use.     VITALS:  Blood pressure 128/74, pulse 86, temperature 98.8 F (37.1 C), temperature source Oral, resp. rate 18, height 5\' 10"  (1.778 m), weight 101.5 kg, SpO2 99 %.  PHYSICAL EXAMINATION:  GENERAL:  84 y.o.-year-old patient lying in the bed with no acute distress.  EYES: Pupils equal, round, reactive to light and  accommodation. No scleral icterus. Extraocular muscles intact.  HEENT: Head atraumatic, normocephalic. Oropharynx and nasopharynx clear.  NECK:  Supple, no jugular venous distention. No thyroid enlargement, no tenderness.  LUNGS: Normal breath sounds bilaterally, no wheezing, rales,rhonchi or crepitation. No use of accessory muscles of respiration.  CARDIOVASCULAR: S1, S2 normal. No murmurs, rubs, or gallops.  ABDOMEN: Soft, nontender, nondistended. Bowel sounds present. No organomegaly or mass.  EXTREMITIES: No pedal edema, cyanosis, or clubbing.  NEUROLOGIC: Cranial nerves II through XII are intact. Muscle strength 4/5 in all extremities. Sensation intact. Gait not checked.  PSYCHIATRIC: The patient is alert and oriented x 3.  SKIN: No obvious rash, lesion, or ulcer.   Physical Exam LABORATORY PANEL:   CBC Recent Labs  Lab 11/19/18 2300  11/21/18 0107 11/21/18 0628  WBC 12.0*  --   --   --   HGB 13.0   < > 9.8* 9.4*  HCT 37.9*  --  29.2*  --   PLT 249  --   --   --    < > = values in this interval not displayed.   ------------------------------------------------------------------------------------------------------------------  Chemistries  Recent Labs  Lab 11/19/18 2300 11/21/18 1015  NA 136 140  K 4.0 4.2  CL 105 108  CO2 22 24  GLUCOSE 114* 97  BUN 23 15  CREATININE 1.16 0.98  CALCIUM 8.9 8.1*  AST 41  --   ALT 33  --   ALKPHOS 52  --   BILITOT 1.0  --    ------------------------------------------------------------------------------------------------------------------  Cardiac Enzymes No results for input(s): TROPONINI in the last  168 hours. ------------------------------------------------------------------------------------------------------------------  RADIOLOGY:  Nm Gi Blood Loss  Result Date: 11/21/2018 CLINICAL DATA:  GI bleeding diarrhea EXAM: NUCLEAR MEDICINE GASTROINTESTINAL BLEEDING SCAN TECHNIQUE: Sequential abdominal images were obtained  following intravenous administration of Tc-60m labeled red blood cells. RADIOPHARMACEUTICALS:  21.41 mCi Tc-62m pertechnetate in-vitro labeled red cells. COMPARISON:  CT 11/20/2018 FINDINGS: Normal blood pool activity. Focal activity in the region of the penis. Faint bladder activity noted. Activity present within the distal descending/proximal sigmoid colon with progression of activity through the sigmoid colon. Configuration of activity appears congruent with the anatomy of the colon on the comparison CT. IMPRESSION: Positive for active GI bleeding, suspected source is the distal descending/proximal sigmoid colon. Critical Value/emergent results were called by telephone at the time of interpretation on 11/21/2018 at 1:20 am to Dr. Sidney Ace, who verbally acknowledged these results. Electronically Signed   By: Donavan Foil M.D.   On: 11/21/2018 01:21   Ct Abdomen Pelvis W Contrast  Result Date: 11/20/2018 CLINICAL DATA:  84 year old male with GI bleed and diarrhea. EXAM: CT ABDOMEN AND PELVIS WITH CONTRAST TECHNIQUE: Multidetector CT imaging of the abdomen and pelvis was performed using the standard protocol following bolus administration of intravenous contrast. CONTRAST:  119mL OMNIPAQUE IOHEXOL 300 MG/ML  SOLN COMPARISON:  CT of the abdomen pelvis dated 12/29/2014 FINDINGS: Lower chest: The visualized lung bases are clear. There is coronary vascular calcification. No intra-abdominal free air or free fluid. Hepatobiliary: The fused fatty infiltration of the liver. No intrahepatic biliary ductal dilatation. The gallbladder is unremarkable. Pancreas: Unremarkable. No pancreatic ductal dilatation or surrounding inflammatory changes. Spleen: Normal in size without focal abnormality. Adrenals/Urinary Tract: The adrenal glands are unremarkable. There is no hydronephrosis on either side. There is symmetric enhancement and excretion of contrast by both kidneys. Multiple bilateral renal cysts measure up to 4 cm in the  volar aspect of the right. Additional smaller renal hypodensities are too small to characterize. The visualized ureters and urinary bladder appear unremarkable. Stomach/Bowel: There are small scattered sigmoid diverticula without active inflammatory changes. There is no bowel obstruction or active inflammation. The appendix is not visualized with certainty. No inflammatory changes identified in the right lower quadrant. Vascular/Lymphatic: Moderate aortoiliac atherosclerotic disease. There is a 2.6 cm infrarenal aortic ectasia. The IVC is unremarkable. No portal venous gas. There is no adenopathy. Reproductive: Prostate brachytherapy seeds. Other: None Musculoskeletal: Degenerative changes of the spine. No acute osseous pathology. IMPRESSION: 1. No acute intra-abdominal or pelvic pathology. No bowel obstruction or active inflammation. 2. Sigmoid diverticulosis. 3. Fatty liver. Aortic Atherosclerosis (ICD10-I70.0). Electronically Signed   By: Anner Crete M.D.   On: 11/20/2018 02:29   Dg Chest Port 1 View  Result Date: 11/19/2018 CLINICAL DATA:  GI bleed EXAM: PORTABLE CHEST 1 VIEW COMPARISON:  08/26/2016 FINDINGS: Prior CABG. Heart and mediastinal contours are within normal limits. No focal opacities or effusions. No acute bony abnormality. IMPRESSION: No active cardiopulmonary disease. Electronically Signed   By: Rolm Baptise M.D.   On: 11/19/2018 23:56    ASSESSMENT AND PLAN:   Active Problems:   GI bleed  This is an 84 year old male admitted for GI bleeding. 1.  GI bleed: The patient has had some melena but also hematochezia.  He is hemodynamically stable.   Hydrate with intravenous fluid.  Maintain 2 large-bore IVs. IV Protonix.  Appreciated GI and surgery consult. Patient had positive bleeding scan so taken by vascular surgery and done embolization of left colic artery and superior segment artery. 2.  Gout: Stable;  continue allopurinol 3.  History of coronary dissection: The patient  underwent cardiac surgery in 2005.  Heart catheterization showed no significant coronary artery disease. 4.  Obesity: BMI is 30.8; encourage healthy diet and exercise 5.  DVT prophylaxis: SCDs 6.  GI prophylaxis: As above  All the records are reviewed and case discussed with Care Management/Social Workerr. Management plans discussed with the patient, family and they are in agreement.  CODE STATUS: Full.  TOTAL TIME TAKING CARE OF THIS PATIENT: 35 minutes.    POSSIBLE D/C IN 2-3 DAYS, DEPENDING ON CLINICAL CONDITION.   Vaughan Basta M.D on 11/21/2018   Between 7am to 6pm - Pager - [phone]  After 6pm go to [email or web address] - password [email or web address] [facility]  Office  [phone]  CC: Primary care physician; Olin Hauser, DO  Note: This dictation was prepared with Dragon dictation along with smaller phrase technology. Any transcriptional errors that result from this process are unintentional.

## 2018-11-21 NOTE — Progress Notes (Signed)
Patient accepted from specials procedure post embolization.  Vitals stable.  Call bell within reach.

## 2018-11-21 NOTE — Progress Notes (Signed)
Family Meeting Note  Advance Directive:yes  Today a meeting took place with the Patient.  The following clinical team members were present during this meeting:MD  The following were discussed:Patient's diagnosis: GI bleed, Patient's progosis: Unable to determine and Goals for treatment: Full Code  Additional follow-up to be provided: Vascular, GI  Time spent during discussion:20 minutes  Vaughan Basta, MD

## 2018-11-21 NOTE — Progress Notes (Signed)
Head of bed up without complication

## 2018-11-21 NOTE — Progress Notes (Signed)
Telephone report called Phillip Nelson.  Patient transported to specials procedure via bed for emobolization.

## 2018-11-21 NOTE — Progress Notes (Signed)
Notify Dr. Anselm Jungling, Dr. Bonna Gains and PA Stegmayer if patient can start on a diet, order to start clear liquid. Also clarify in patient still needs to be prep for colonoscopy, order to discontinue prep. Also protonix drip is still infusing, since patient had his aortogram/embolization per GI ok to discontinue protonix drip now. RN will continue to monitor.

## 2018-11-21 NOTE — Consult Note (Signed)
Subjective:   CC: Melena  HPI:  Phillip Nelson is a 84 y.o. male who was consulted by [facility] for issue above.  Symptoms were first noted a few days ago.  Painless, no other associated symptoms.  She has a history of bleeding diverticulitis in the past per chart review.  Endoscopies noted some diverticulosis but no other obvious issues.  Patient otherwise has no complaints.  He states he is continuing to have bloody bowel movements while he is taking the colonoscopy prep. Surgery consulted due to positive bleeding scan.    Past Medical History:  has a past medical history of Angina at rest [facility]), Aural vertigo, Coronary artery disease ([place]), ED (erectile dysfunction), Gout, arthritis, History of skin cancer, Hyperlipidemia, OSA (obstructive sleep apnea), and Prostate cancer ([place]) (05/08/12).  Past Surgical History:  Past Surgical History:  Procedure Laterality Date  . APPENDECTOMY     Life was saved with emerg. surgery.   Marland Kitchen CARDIAC SURGERY    . CARDIOVASCULAR STRESS TEST  09-07-2011  dr Saralyn Pilar   normal perfusion/ ef 57%/ no ischemia  . CARPECTOMY Right    wrist  . CATARACT EXTRACTION W/ INTRAOCULAR LENS  IMPLANT, BILATERAL    . COLONOSCOPY WITH PROPOFOL N/A 10/31/2016   Procedure: COLONOSCOPY WITH PROPOFOL;  Surgeon: Christene Lye, MD;  Location: [facility] ENDOSCOPY;  Service: Endoscopy;  Laterality: N/A;  . CORONARY ARTERY BYPASS GRAFT  JULY 2005  ([place])   LIMA TO THE LAD  . DORSAL COMPARTMENT RELEASE Left 06/25/2017   Procedure: LEFT WRIST TENDON SHEATH RELEASE;  Surgeon: Milly Jakob, MD;  Location: [place];  Service: Orthopedics;  Laterality: Left;  . FOOT SURGERY Bilateral    GOUT  . KNEE ARTHROSCOPY Bilateral   . LEFT ELBOW SURGERY  AGE 24  &  2007  . RADIOACTIVE SEED IMPLANT N/A 08/02/2012   Procedure: RADIOACTIVE SEED IMPLANT;  Surgeon: Claybon Jabs, MD;  Location: [facility];  Service: Urology;  Laterality: N/A;  . TOTAL  KNEE ARTHROPLASTY Bilateral LEFT 2012/   RIGHT 2006  . TRANSTHORACIC ECHOCARDIOGRAM  11-24-2010   normal lvf &  lvh/ mild mr, tr, and ai    Family History: family history includes Alzheimer's disease in his father; Cancer (age of onset: 54) in his sister.  Social History:  reports that he quit smoking about 28 years ago. He has a 93.00 pack-year smoking history. He has never used smokeless tobacco. He reports that he does not drink alcohol or use drugs.  Current Medications:  Medications Prior to Admission  Medication Sig Dispense Refill  . allopurinol (ZYLOPRIM) 300 MG tablet TAKE ONE (1) TABLET BY MOUTH EVERY DAY AS DIRECTED (Patient taking differently: Take 300 mg by mouth daily. ) 30 tablet 3  . meclizine (ANTIVERT) 25 MG tablet Take 25 mg by mouth 3 (three) times daily as needed for dizziness.     . Potassium 99 MG TABS Take 396 mg by mouth daily.       Allergies:  Allergies as of 11/19/2018 - Review Complete 11/19/2018  Allergen Reaction Noted  . Indocin [indomethacin] Other (See Comments) 06/05/2012  . Penicillins Itching and Rash 06/05/2012    ROS:  General: Denies weight loss, weight gain, fatigue, fevers, chills, and night sweats. Eyes: Denies blurry vision, double vision, eye pain, itchy eyes, and tearing. Ears: Denies hearing loss, earache, and ringing in ears. Nose: Denies sinus pain, congestion, infections, runny nose, and nosebleeds. Mouth/throat: Denies hoarseness, sore throat, bleeding gums,  and difficulty swallowing. Heart: Denies chest pain, palpitations, racing heart, irregular heartbeat, leg pain or swelling, and decreased activity tolerance. Respiratory: Denies breathing difficulty, shortness of breath, wheezing, cough, and sputum. GI: Denies change in appetite, heartburn, nausea, vomiting, constipation, diarrhea. GU: Denies difficulty urinating, pain with urinating, urgency, frequency, blood in urine. Musculoskeletal: Denies joint stiffness, pain, swelling,  muscle weakness. Skin: Denies rash, itching, mass, tumors, sores, and boils Neurologic: Denies headache, fainting, dizziness, seizures, numbness, and tingling. Psychiatric: Denies depression, anxiety, difficulty sleeping, and memory loss. Endocrine: Denies heat or cold intolerance, and increased thirst or urination. Blood/lymph: Denies easy bruising, easy bruising, and swollen glands     Objective:     BP 120/60 (BP Location: Right Arm)   Pulse 85   Temp 98.1 F (36.7 C) (Oral)   Resp 16   Ht 5\' 10"  (1.778 m)   Wt 101.5 kg   SpO2 99%   BMI 32.10 kg/m   Constitutional :  alert, cooperative, appears stated age and no distress  Lymphatics/Throat:  no asymmetry, masses, or scars  Respiratory:  clear to auscultation bilaterally  Cardiovascular:  regular rate and rhythm  Gastrointestinal: soft, non-tender; bowel sounds normal; no masses,  no organomegaly.   Musculoskeletal: Steady movement  Skin: Cool and moist   Psychiatric: Normal affect, non-agitated, not confused       LABS:  CMP Latest Ref Rng & Units 11/19/2018 10/29/2017 05/11/2017  Glucose 70 - 99 mg/dL 114(H) 100(H) 114(H)  BUN 8 - 23 mg/dL 23 13 16   Creatinine 0.61 - 1.24 mg/dL 1.16 0.99 0.88  Sodium 135 - 145 mmol/L 136 139 137  Potassium 3.5 - 5.1 mmol/L 4.0 4.5 3.3(L)  Chloride 98 - 111 mmol/L 105 106 107  CO2 22 - 32 mmol/L 22 24 23   Calcium 8.9 - 10.3 mg/dL 8.9 9.6 8.8(L)  Total Protein 6.5 - 8.1 g/dL 6.7 7.4 -  Total Bilirubin 0.3 - 1.2 mg/dL 1.0 1.1 -  Alkaline Phos 38 - 126 U/L 52 - -  AST 15 - 41 U/L 41 34 -  ALT 0 - 44 U/L 33 32 -   CBC Latest Ref Rng & Units 11/21/2018 11/21/2018 11/20/2018  WBC 4.0 - 10.5 K/uL - - -  Hemoglobin 13.0 - 17.0 g/dL 9.4(L) 9.8(L) 10.9(L)  Hematocrit 39.0 - 52.0 % - 29.2(L) -  Platelets 150 - 400 K/uL - - -    RADS: CLINICAL DATA:  GI bleeding diarrhea  EXAM: NUCLEAR MEDICINE GASTROINTESTINAL BLEEDING SCAN  TECHNIQUE: Sequential abdominal images were obtained  following intravenous administration of Tc-17m labeled red blood cells.  RADIOPHARMACEUTICALS:  21.41 mCi Tc-15m pertechnetate in-vitro labeled red cells.  COMPARISON:  CT 11/20/2018  FINDINGS: Normal blood pool activity. Focal activity in the region of the penis. Faint bladder activity noted.  Activity present within the distal descending/proximal sigmoid colon with progression of activity through the sigmoid colon. Configuration of activity appears congruent with the anatomy of the colon on the comparison CT.  IMPRESSION: Positive for active GI bleeding, suspected source is the distal descending/proximal sigmoid colon.  Critical Value/emergent results were called by telephone at the time of interpretation on 11/21/2018 at 1:20 am to Dr. Sidney Ace, who verbally acknowledged these results.   Electronically Signed   By: Donavan Foil M.D.   On: 11/21/2018 01:21  Assessment:   Lower GI bleed likely source from a diverticulosis.  Plan:   Patient was already actively drinking colonoscopy prep for the pending colonoscopy.  Vital signs are stable, denies  any pain, but still having maroon-colored stools.  Hemoglobin overall is trending slowly downward as well.  Agree with endoscopy intervention first, and if that fails vascular surgery consult for possible embolization.  Segmental colon resection should be the last resort in these chronic cases.  Surgery will peripherally follow for now.

## 2018-11-21 NOTE — Op Note (Signed)
[facility] Percutaneous Study/Intervention Procedural Note     Surgeon(s): Nurse, children's: none  Pre-operative Diagnosis: 1. Lower GI bleed 2.  Coronary artery disease  Post-operative diagnosis: Same  Procedure(s) Performed: 1. Ultrasound guidance for vascular access right femoral artery 2. Catheter placement into the left colic artery and a secondary branch as well as the superior sigmoid artery 3. Aortogram and selective angiogram of the IMA and its distal branches 4. Microbead embolization of left colic artery as well as the superior sigmoid artery with 500-700  polyvinyl alcohol beads. 5. StarClose closure device right femoral artery  Anesthesia: Moderate conscious sedation was utilized with a combination of parenteral Versed and fentanyl.  Continuous ECG pulse oximetry and cardiopulmonary monitoring was performed throughout the entire procedure by the interventional radiology nurse total sedation time was 50 minutes.  EBL: 15 cc  Fluoro Time: 13.2 minutes minutes  Contrast: 45 cc  Indications: Patient is a 84 y.o.male with brisk lower GI bleeding with resultant anemia. The patient has a nuclear medicine study showing a distal descending proximal sigmoid bleed. The patient is brought in for angiography for further evaluation and potential treatment. Risks and benefits are discussed and informed consent is obtained  Procedure: The patient was identified and appropriate procedural time out was performed. The patient was then placed supine on the table and prepped and draped in the usual sterile fashion.Moderate conscious sedation was administered during a face to face encounter with the patient throughout the procedure with my supervision of the RN administering medicines and monitoring the patient's vital signs, pulse oximetry, telemetry and mental status  throughout from the start of the procedure until the patient was taken to the recovery room.  Ultrasound was used to evaluate the right common femoral artery. It was patent . A digital ultrasound image was acquired. A Seldinger needle was used to access the right common femoral artery under direct ultrasound guidance and a permanent image was performed. A 0.035 J wire was advanced without resistance and a 5Fr sheath was placed.   Pigtail catheter was placed into the aorta and an RAO aortogram was performed. This demonstrated the approximate location of the IMA. A VS 1 catheter was used to selectively cannulate the IMA.  Selective injection of the IMA was then performed through the V S1 and this demonstrated the left colonic artery as well as the sigmoid arteries. Based on her continued bleeding and the nuclear medicine study I elected to treat this area with embolization.   I initially advanced the Pro-Great microcatheter out the left colon artery and instilled approximately 1 cc of 500 to 700 m PVC beads in this location. Angiogram following this showed the main vessels to be open with less brisk filling. I then pulled back and cannulated the superior sigmoid artery with the Pro-great microcatheter without difficulty. Selective imaging was performed which showed a subtle blush consistent with the nuclear scan.  1 cc polyvinyl alcohol beads were deployed in the superior sigmoid artery. Again, completion angiogram showed the main vessels to be open with less brisk filling but a persistent blush was identified off a secondary branch of the left colic artery.  The prograde was renegotiated into the left colic and then into this secondary branch and a third dose of one half a cc of beads was instilled.  Follow-up imaging now demonstrated good flow with filling of the larger branches but no longer identifying a blush.   I elected to terminate the procedure. The  diagnostic catheter was removed. StarClose  closure device was deployed in usual fashion with excellent hemostatic result. The patient was taken to the recovery room in stable condition having tolerated the procedure well.     Findings:Blush consistent with a bleed was identified in the distal descending/proximal sigmoidal area.  This was treated with selective injection at 3 locations.  Successful embolization of the left distal descending colon and proximal sigmoid.  Disposition: Patient was taken to the recovery room in stable condition having tolerated the procedure well.  Complications: None  Phillip Nelson 11/21/2018 1:35 PM   This note was created with Dragon Medical transcription system. Any errors in dictation are purely unintentional.

## 2018-11-21 NOTE — Consult Note (Signed)
[facility] Vascular Consult Note  MRN : [id number]  Phillip Nelson is a 84 y.o. (24-Apr-1934) male who presents with chief complaint of  Chief Complaint  Patient presents with  . GI Bleeding   History of Present Illness:  The patient is an 84 year old male with multiple medical issues (see below) who presented to the [facility] with a chief complaint of rectal bleeding.  Patient endorses a history of dark tarry stools for approximately 1 day.  These dark tarry stools eventually became grossly bloody which prompted him to seek medical attention.  The patient notes that he does have diverticulosis and has had bleeding issues in the past.  Upon arrival to the emergency department, the patient noted that he was feeling "lightheaded".  He denies any chest pain or shortness of breath.  He denies any fever, nausea vomiting.  Notes that his bowel movements have been painless.  Patient denies taking any blood thinning medication.  Patient underwent a bleeding scan which was notable for: Positive for active GI bleeding, suspected source is the distal descending/proximal sigmoid colon.  Vascular surgery was consulted by Dr. Sidney Ace for further recommendations  Current Facility-Administered Medications  Medication Dose Route Frequency Provider Last Rate Last Dose  . 0.9 %  sodium chloride infusion   Intravenous Continuous Sakai, Isami, DO 100 mL/hr at 11/21/18 0245    . acetaminophen (TYLENOL) tablet 650 mg  650 mg Oral Q6H PRN Harrie Foreman, MD       Or  . acetaminophen (TYLENOL) suppository 650 mg  650 mg Rectal Q6H PRN Harrie Foreman, MD      . allopurinol (ZYLOPRIM) tablet 300 mg  300 mg Oral Daily Harrie Foreman, MD   300 mg at 11/20/18 1004  . docusate sodium (COLACE) capsule 100 mg  100 mg Oral BID Harrie Foreman, MD   100 mg at 11/20/18 1004  . meclizine (ANTIVERT) tablet 25 mg  25 mg Oral TID PRN Harrie Foreman, MD       . ondansetron [facility]) tablet 4 mg  4 mg Oral Q6H PRN Harrie Foreman, MD       Or  . ondansetron [facility]) injection 4 mg  4 mg Intravenous Q6H PRN Harrie Foreman, MD      . pantoprazole (PROTONIX) 80 mg in sodium chloride 0.9 % 250 mL (0.32 mg/mL) infusion  8 mg/hr Intravenous Continuous Harrie Foreman, MD 25 mL/hr at 11/21/18 0346 8 mg/hr at 11/21/18 0346  . [START ON 11/23/2018] pantoprazole (PROTONIX) injection 40 mg  40 mg Intravenous Q12H Harrie Foreman, MD      . technetium labeled red blood cells (ULTRATAG) injection kit 20 millicurie  20 millicurie Intravenous Once PRN Register, Anola Gurney., MD       Past Medical History:  Diagnosis Date  . Angina at rest [facility])   . Aural vertigo   . Coronary artery disease [facility]  . ED (erectile dysfunction)   . Gout, arthritis   . History of skin cancer   . Hyperlipidemia   . OSA (obstructive sleep apnea)    CPAP NON-COMPLIANT  . Prostate cancer ([place]) 05/08/12   Adenocarcinoma,gleason=3+3=6,PSA=8.26,vol=57cc   Past Surgical History:  Procedure Laterality Date  . APPENDECTOMY     Life was saved with emerg. surgery.   Marland Kitchen CARDIAC SURGERY    . CARDIOVASCULAR STRESS TEST  09-07-2011  dr Saralyn Pilar   normal perfusion/ ef 57%/ no ischemia  . CARPECTOMY  Right    wrist  . CATARACT EXTRACTION W/ INTRAOCULAR LENS  IMPLANT, BILATERAL    . COLONOSCOPY WITH PROPOFOL N/A 10/31/2016   Procedure: COLONOSCOPY WITH PROPOFOL;  Surgeon: Christene Lye, MD;  Location: [facility] ENDOSCOPY;  Service: Endoscopy;  Laterality: N/A;  . CORONARY ARTERY BYPASS GRAFT  JULY 2005  ([place])   LIMA TO THE LAD  . DORSAL COMPARTMENT RELEASE Left 06/25/2017   Procedure: LEFT WRIST TENDON SHEATH RELEASE;  Surgeon: Milly Jakob, MD;  Location: [place];  Service: Orthopedics;  Laterality: Left;  . FOOT SURGERY Bilateral    GOUT  . KNEE ARTHROSCOPY Bilateral   . LEFT ELBOW SURGERY  AGE 75  &  2007  . RADIOACTIVE SEED IMPLANT N/A  08/02/2012   Procedure: RADIOACTIVE SEED IMPLANT;  Surgeon: Claybon Jabs, MD;  Location: [facility];  Service: Urology;  Laterality: N/A;  . TOTAL KNEE ARTHROPLASTY Bilateral LEFT 2012/   RIGHT 2006  . TRANSTHORACIC ECHOCARDIOGRAM  11-24-2010   normal lvf &  lvh/ mild mr, tr, and ai   Social History Social History   Tobacco Use  . Smoking status: Former Smoker    Packs/day: 3.00    Years: 31.00    Pack years: 93.00    Quit date: 08/01/1990    Years since quitting: 28.3  . Smokeless tobacco: Never Used  Substance Use Topics  . Alcohol use: No    Comment: rarely  6 beers year,  . Drug use: No   Family History Family History  Problem Relation Age of Onset  . Alzheimer's disease Father   . Cancer Sister 26       treated surgically  Denies family history of peripheral artery disease, venous disease and or bleeding/clotting disorders.  Allergies  Allergen Reactions  . Indocin [Indomethacin] Other (See Comments)    Shakes. tremors  . Penicillins Itching, Rash and Other (See Comments)    Did it involve swelling of the face/tongue/throat, SOB, or low BP? Unknown Did it involve sudden or severe rash/hives, skin peeling, or any reaction on the inside of your mouth or nose? Unknown Did you need to seek medical attention at a hospital or doctor's office? Unknown When did it last happen?unknown If all above answers are "NO", may proceed with cephalosporin use.    REVIEW OF SYSTEMS (Negative unless checked)  Constitutional: '[]' Weight loss  '[]' Fever  '[]' Chills Cardiac: '[]' Chest pain   '[]' Chest pressure   '[]' Palpitations   '[]' Shortness of breath when laying flat   '[]' Shortness of breath at rest   '[]' Shortness of breath with exertion. Vascular:  '[]' Pain in legs with walking   '[]' Pain in legs at rest   '[]' Pain in legs when laying flat   '[]' Claudication   '[]' Pain in feet when walking  '[]' Pain in feet at rest  '[]' Pain in feet when laying flat   '[]' History of DVT   '[]' Phlebitis   '[]' Swelling in  legs   '[]' Varicose veins   '[]' Non-healing ulcers Pulmonary:   '[]' Uses home oxygen   '[]' Productive cough   '[]' Hemoptysis   '[]' Wheeze  '[]' COPD   '[]' Asthma Neurologic:  '[]' Dizziness  '[]' Blackouts   '[]' Seizures   '[]' History of stroke   '[]' History of TIA  '[]' Aphasia   '[]' Temporary blindness   '[]' Dysphagia   '[]' Weakness or numbness in arms   '[]' Weakness or numbness in legs Musculoskeletal:  '[]' Arthritis   '[]' Joint swelling   '[]' Joint pain   '[]' Low back pain Hematologic:  '[]' Easy bruising  '[]' Easy bleeding   '[]' Hypercoagulable state   '[]'   Anemic  '[]' Hepatitis Gastrointestinal:  '[]' Blood in stool   '[]' Vomiting blood  '[]' Gastroesophageal reflux/heartburn   '[]' Difficulty swallowing. Genitourinary:  '[]' Chronic kidney disease   '[]' Difficult urination  '[]' Frequent urination  '[]' Burning with urination   '[]' Blood in urine Skin:  '[]' Rashes   '[]' Ulcers   '[]' Wounds Psychological:  '[]' History of anxiety   '[]'  History of major depression.  Physical Examination  Vitals:   11/21/18 0026 11/21/18 0300 11/21/18 0500 11/21/18 0900  BP: 108/62 120/60  131/78  Pulse:  85  84  Resp:  16  16  Temp:  98.1 F (36.7 C)  98.7 F (37.1 C)  TempSrc:  Oral  Oral  SpO2:  99%  98%  Weight:   101.5 kg   Height:       Body mass index is 32.1 kg/m. Gen:  WD/WN, NAD Head: [place]/AT, No temporalis wasting. Prominent temp pulse not noted. Ear/Nose/Throat: Hearing grossly intact, nares w/o erythema or drainage, oropharynx w/o Erythema/Exudate Eyes: Sclera non-icteric, conjunctiva clear Neck: Trachea midline.  No JVD.  Pulmonary:  Good air movement, respirations not labored, equal bilaterally.  Cardiac: RRR, normal S1, S2. Vascular:  Vessel Right Left  Radial Palpable Palpable  Ulnar Palpable Palpable  Brachial Palpable Palpable  Carotid Palpable, without bruit Palpable, without bruit  Aorta Not palpable N/A  Femoral Palpable Palpable  Popliteal Palpable Palpable  PT Palpable Palpable  DP Palpable Palpable   Gastrointestinal: soft, non-tender/non-distended. No  guarding/reflex.  Musculoskeletal: M/S 5/5 throughout.  Extremities without ischemic changes.  No deformity or atrophy. No edema. Neurologic: Sensation grossly intact in extremities.  Symmetrical.  Speech is fluent. Motor exam as listed above. Psychiatric: Judgment intact, Mood & affect appropriate for pt's clinical situation. Dermatologic: No rashes or ulcers noted.  No cellulitis or open wounds. Lymph : No Cervical, Axillary, or Inguinal lymphadenopathy.  CBC Lab Results  Component Value Date   WBC 12.0 (H) 11/19/2018   HGB 9.4 (L) 11/21/2018   HCT 29.2 (L) 11/21/2018   MCV 97.9 11/19/2018   PLT 249 11/19/2018   BMET    Component Value Date/Time   NA 136 11/19/2018 2300   K 4.0 11/19/2018 2300   CL 105 11/19/2018 2300   CO2 22 11/19/2018 2300   GLUCOSE 114 (H) 11/19/2018 2300   BUN 23 11/19/2018 2300   CREATININE 1.16 11/19/2018 2300   CREATININE 0.99 10/29/2017 0959   CALCIUM 8.9 11/19/2018 2300   GFRNONAA 59 (L) 11/19/2018 2300   GFRNONAA 72 10/29/2017 0959   GFRAA >60 11/19/2018 2300   GFRAA 83 10/29/2017 0959   Estimated Creatinine Clearance: 59.6 mL/min (by C-G formula based on SCr of 1.16 mg/dL).  COAG Lab Results  Component Value Date   INR 1.0 11/19/2018   INR 0.96 05/11/2017   INR 0.93 07/26/2012   Radiology Nm Gi Blood Loss  Result Date: 11/21/2018 CLINICAL DATA:  GI bleeding diarrhea EXAM: NUCLEAR MEDICINE GASTROINTESTINAL BLEEDING SCAN TECHNIQUE: Sequential abdominal images were obtained following intravenous administration of Tc-68mlabeled red blood cells. RADIOPHARMACEUTICALS:  21.41 mCi Tc-941mertechnetate in-vitro labeled red cells. COMPARISON:  CT 11/20/2018 FINDINGS: Normal blood pool activity. Focal activity in the region of the penis. Faint bladder activity noted. Activity present within the distal descending/proximal sigmoid colon with progression of activity through the sigmoid colon. Configuration of activity appears congruent with the anatomy  of the colon on the comparison CT. IMPRESSION: Positive for active GI bleeding, suspected source is the distal descending/proximal sigmoid colon. Critical Value/emergent results were called by telephone at the  time of interpretation on 11/21/2018 at 1:20 am to Dr. Sidney Ace, who verbally acknowledged these results. Electronically Signed   By: Donavan Foil M.D.   On: 11/21/2018 01:21   Ct Abdomen Pelvis W Contrast  Result Date: 11/20/2018 CLINICAL DATA:  84 year old male with GI bleed and diarrhea. EXAM: CT ABDOMEN AND PELVIS WITH CONTRAST TECHNIQUE: Multidetector CT imaging of the abdomen and pelvis was performed using the standard protocol following bolus administration of intravenous contrast. CONTRAST:  146m OMNIPAQUE IOHEXOL 300 MG/ML  SOLN COMPARISON:  CT of the abdomen pelvis dated 12/29/2014 FINDINGS: Lower chest: The visualized lung bases are clear. There is coronary vascular calcification. No intra-abdominal free air or free fluid. Hepatobiliary: The fused fatty infiltration of the liver. No intrahepatic biliary ductal dilatation. The gallbladder is unremarkable. Pancreas: Unremarkable. No pancreatic ductal dilatation or surrounding inflammatory changes. Spleen: Normal in size without focal abnormality. Adrenals/Urinary Tract: The adrenal glands are unremarkable. There is no hydronephrosis on either side. There is symmetric enhancement and excretion of contrast by both kidneys. Multiple bilateral renal cysts measure up to 4 cm in the volar aspect of the right. Additional smaller renal hypodensities are too small to characterize. The visualized ureters and urinary bladder appear unremarkable. Stomach/Bowel: There are small scattered sigmoid diverticula without active inflammatory changes. There is no bowel obstruction or active inflammation. The appendix is not visualized with certainty. No inflammatory changes identified in the right lower quadrant. Vascular/Lymphatic: Moderate aortoiliac atherosclerotic  disease. There is a 2.6 cm infrarenal aortic ectasia. The IVC is unremarkable. No portal venous gas. There is no adenopathy. Reproductive: Prostate brachytherapy seeds. Other: None Musculoskeletal: Degenerative changes of the spine. No acute osseous pathology. IMPRESSION: 1. No acute intra-abdominal or pelvic pathology. No bowel obstruction or active inflammation. 2. Sigmoid diverticulosis. 3. Fatty liver. Aortic Atherosclerosis (ICD10-I70.0). Electronically Signed   By: AAnner CreteM.D.   On: 11/20/2018 02:29   Dg Chest Port 1 View  Result Date: 11/19/2018 CLINICAL DATA:  GI bleed EXAM: PORTABLE CHEST 1 VIEW COMPARISON:  08/26/2016 FINDINGS: Prior CABG. Heart and mediastinal contours are within normal limits. No focal opacities or effusions. No acute bony abnormality. IMPRESSION: No active cardiopulmonary disease. Electronically Signed   By: KRolm BaptiseM.D.   On: 11/19/2018 23:56   Assessment/Plan The patient is an 84year old male with multiple medical issues (see below) [facility] a [facility] 1.  GI bleed: [facility] a day of melena transitioning to bright red blood per rectum.  Patient had a positive bleeding scan: "Positive for active GI bleeding, suspected source is the distal descending/proximal sigmoid colon" Recommend colonic embolization in an effort to stop the bleeding.  Procedure, risks and benefits were explained to the patient.  All questions were answered.  Patient wishes to proceed.  The patient understands agreeable to try this once after unable to successfully stop the bleeding that he may have to undergo surgery with the general surgery team.  He expresses his understanding.  We will plan on this today with Dr. SDelana Meyer 2.  Hyperlipidemia: Would recommend aspirin (when GI bleeding has been controlled) and statin for medical  management. Encouraged good control as its slows the progression of atherosclerotic disease. 3. CAD: Asymptomatic at this time.  This is followed by the patient's primary care physician and cardiologist.  Discussed with Dr. SFrancene Castle PA-C  11/21/2018 10:00 AM  This note  was created with Dragon medical transcription system.  Any error is purely unintentional

## 2018-11-22 ENCOUNTER — Encounter: Payer: Self-pay | Admitting: Vascular Surgery

## 2018-11-22 LAB — BASIC METABOLIC PANEL
Anion gap: 10 (ref 5–15)
BUN: 8 mg/dL (ref 8–23)
CO2: 21 mmol/L — ABNORMAL LOW (ref 22–32)
Calcium: 8.3 mg/dL — ABNORMAL LOW (ref 8.9–10.3)
Chloride: 108 mmol/L (ref 98–111)
Creatinine, Ser: 1 mg/dL (ref 0.61–1.24)
GFR calc Af Amer: >60 mL/min (ref >60)
GFR calc non Af Amer: >60 mL/min (ref >60)
Glucose, Bld: 127 mg/dL — ABNORMAL HIGH (ref 70–99)
Potassium: 3.5 mmol/L (ref 3.5–5.1)
Sodium: 139 mmol/L (ref 135–145)

## 2018-11-22 LAB — CBC
HCT: 26.8 % — ABNORMAL LOW (ref 39.0–52.0)
Hemoglobin: 8.9 g/dL — ABNORMAL LOW (ref 13.0–17.0)
MCH: 33.7 pg (ref 26.0–34.0)
MCHC: 33.2 g/dL (ref 30.0–36.0)
MCV: 101.5 fL — ABNORMAL HIGH (ref 80.0–100.0)
Platelets: 245 10*3/uL (ref 150–400)
RBC: 2.64 MIL/uL — ABNORMAL LOW (ref 4.22–5.81)
RDW: 13 % (ref 11.5–15.5)
WBC: 17.8 10*3/uL — ABNORMAL HIGH (ref 4.0–10.5)
nRBC: 0 % (ref 0.0–0.2)

## 2018-11-22 LAB — GLUCOSE, CAPILLARY
Glucose-Capillary: 88 mg/dL (ref 70–99)
Glucose-Capillary: 109 mg/dL — ABNORMAL HIGH (ref 70–99)

## 2018-11-22 MED ORDER — PANTOPRAZOLE SODIUM 40 MG PO TBEC
40.0000 mg | DELAYED_RELEASE_TABLET | Freq: Every day | ORAL | Status: DC
  Administered 2018-11-22: 40 mg via ORAL
  Filled 2018-11-22: qty 1

## 2018-11-22 MED ORDER — POLYETHYLENE GLYCOL 3350 17 G PO PACK: 17.0000 g | PACK | Freq: Every day | ORAL | 0 refills | Status: DC

## 2018-11-22 MED ORDER — PANTOPRAZOLE SODIUM 40 MG PO TBEC: 40.0000 mg | DELAYED_RELEASE_TABLET | Freq: Every day | ORAL | 0 refills | Status: DC

## 2018-11-22 NOTE — Progress Notes (Signed)
11/22/2018 1730  Waylan Rocher to be D/C'd Home per MD order.  Discussed prescriptions and follow up appointments with the patient. Prescriptions given to patient, medication list explained in detail. Pt verbalized understanding.  Allergies as of 11/22/2018      Reactions   Indocin [indomethacin] Other (See Comments)   Shakes. tremors   Penicillins Itching, Rash, Other (See Comments)   Did it involve swelling of the face/tongue/throat, SOB, or low BP? Unknown Did it involve sudden or severe rash/hives, skin peeling, or any reaction on the inside of your mouth or nose? Unknown Did you need to seek medical attention at a hospital or doctor's office? Unknown When did it last happen?unknown If all above answers are "NO", may proceed with cephalosporin use.      Medication List    TAKE these medications   allopurinol 300 MG tablet Commonly known as: ZYLOPRIM TAKE ONE (1) TABLET BY MOUTH EVERY DAY AS DIRECTED What changed: See the new instructions. Notes to patient: Morning 11/23/18   meclizine 25 MG tablet Commonly known as: ANTIVERT Take 25 mg by mouth 3 (three) times daily as needed for dizziness. Notes to patient: As needed   pantoprazole 40 MG tablet Commonly known as: PROTONIX Take 1 tablet (40 mg total) by mouth daily.   polyethylene glycol 17 g packet Commonly known as: MiraLax Take 17 g by mouth daily.   Potassium 99 MG Tabs Take 396 mg by mouth daily. Notes to patient: Morning 11/23/18       Vitals:   11/22/18 0508 11/22/18 1203  BP: 140/82 125/66  Pulse: (!) 105 (!) 106  Resp:  19  Temp: 98.4 F (36.9 C) 97.8 F (36.6 C)  SpO2: 99% 100%    Skin clean, dry and intact without evidence of skin break down, no evidence of skin tears noted. IV catheter discontinued intact. Site without signs and symptoms of complications. Dressing and pressure applied. Pt denies pain at this time. No complaints noted.  An After Visit Summary was printed and given to the  patient. Patient escorted via [place], and D/C home via private auto.  Dola Argyle

## 2018-11-22 NOTE — Progress Notes (Signed)
[facility] Daily Progress Note   Subjective: 1 Day Post-Op: 1. Ultrasound guidance for vascular access right femoral artery 2. Catheter placement into the left colic artery and a secondary branch as well as the superior sigmoid artery 3. Aortogram and selective angiogram of the IMA and its distal branches 4. Microbead embolization of left colic artery as well as the superior sigmoid artery with 500-700  polyvinyl alcohol beads. 5. StarClose closure device right femoral artery  Patient without complaint this afternoon.  No issues overnight.  Patient states he has not experienced another bloody bowel movement.  Objective: Vitals:   11/21/18 2030 11/22/18 0508 11/22/18 0512 11/22/18 1203  BP: 131/73 140/82  125/66  Pulse: 93 (!) 105  (!) 106  Resp: 18   19  Temp: 98.5 F (36.9 C) 98.4 F (36.9 C)  97.8 F (36.6 C)  TempSrc: Oral Oral  Oral  SpO2: 97% 99%  100%  Weight:   98.3 kg   Height:        Intake/Output Summary (Last 24 hours) at 11/22/2018 1312 Last data filed at 11/22/2018 0900 Gross per 24 hour  Intake 1200 ml  Output -  Net 1200 ml   Physical Exam: A&Ox3, NAD CV: RRR Pulmonary: CTA Bilaterally Abdomen: Soft, Nontender, Nondistended Right Groin: Access site is clean dry and intact.  No signs of drainage or swelling. Vascular: Bilateral lower extremity warm distally to toes   Laboratory: CBC    Component Value Date/Time   WBC 17.8 (H) 11/22/2018 0913   HGB 8.9 (L) 11/22/2018 0913   HCT 26.8 (L) 11/22/2018 0913   PLT 245 11/22/2018 0913   BMET    Component Value Date/Time   NA 139 11/22/2018 0913   K 3.5 11/22/2018 0913   CL 108 11/22/2018 0913   CO2 21 (L) 11/22/2018 0913   GLUCOSE 127 (H) 11/22/2018 0913   BUN 8 11/22/2018 0913   CREATININE 1.00 11/22/2018 0913   CREATININE 0.99 10/29/2017 0959   CALCIUM 8.3 (L) 11/22/2018 0913   GFRNONAA >60 11/22/2018 0913   GFRNONAA 72 10/29/2017 0959   GFRAA >60 11/22/2018 0913   GFRAA 83 10/29/2017 0959   Assessment/Planning: The patient is a 84 year old male who presented to [facility] with a GI bleed status post endovascular embolization POD#1 1) hemoglobin stable this a.m. 2) patient with almost 2L of urine yesterday / stable vital signs - highly unlikely there is any active bleeding 3) no further recommendations from the vascular surgery service at this time 4) the patient does not need to see [place] in the outpatient setting however highly encouraged him to follow-up with his primary care doctor in 1 week to have blood redrawn  Discussed with Dr. Eber Hong Ilyanna Baillargeon PA-C 11/22/2018 1:12 PM

## 2018-11-22 NOTE — Care Management Important Message (Signed)
Important Message  Patient Details  Name: Phillip Nelson MRN: [id number] Date of Birth: [birth year]   Medicare Important Message Given:  Yes     Dannette Barbara 11/22/2018, 10:54 AM

## 2018-11-22 NOTE — Discharge Instructions (Signed)
Vascular Surgery Discharge Instructions 1) You may remove your groin dressing tomorrow and shower.  Please keep your groin clean and dry.

## 2018-11-22 NOTE — Progress Notes (Addendum)
Phillip Antigua, MD [address], [place], [place], [place], [address] [id number], [place], [place], [place], [address] Phone: [phone]  Fax: [phone]   Subjective: No further GI bleeding since embolization.  No abdominal pain.   Objective: Exam: Vital signs in last 24 hours: Vitals:   11/21/18 2030 11/22/18 0508 11/22/18 0512 11/22/18 1203  BP: 131/73 140/82  125/66  Pulse: 93 (!) 105  (!) 106  Resp: 18   19  Temp: 98.5 F (36.9 C) 98.4 F (36.9 C)  97.8 F (36.6 C)  TempSrc: Oral Oral  Oral  SpO2: 97% 99%  100%  Weight:   98.3 kg   Height:       Weight change: 0.03 kg  Intake/Output Summary (Last 24 hours) at 11/22/2018 1537 Last data filed at 11/22/2018 0900 Gross per 24 hour  Intake 1200 ml  Output -  Net 1200 ml    General: No acute distress, AAO x3 Abd: Soft, NT/ND, No HSM Skin: Warm, no rashes Neck: Supple, Trachea midline   Lab Results: Lab Results  Component Value Date   WBC 17.8 (H) 11/22/2018   HGB 8.9 (L) 11/22/2018   HCT 26.8 (L) 11/22/2018   MCV 101.5 (H) 11/22/2018   PLT 245 11/22/2018   Micro Results: Recent Results (from the past 240 hour(s))  SARS Coronavirus 2 [facility] order, Performed in [place] hospital lab)     Status: None   Collection Time: 11/19/18 11:32 PM  Result Value Ref Range Status   SARS Coronavirus 2 NEGATIVE NEGATIVE Final    Comment: (NOTE) If result is NEGATIVE SARS-CoV-2 target nucleic acids are NOT DETECTED. The SARS-CoV-2 RNA is generally detectable in upper and lower  respiratory specimens during the acute phase of infection. The lowest  concentration of SARS-CoV-2 viral copies this assay can detect is 250  copies / mL. A negative result does not preclude SARS-CoV-2 infection  and should not be used as the sole basis for treatment or other  patient management decisions.  A negative result may occur with  improper specimen collection / handling, submission of specimen other  than  nasopharyngeal swab, presence of viral mutation(s) within the  areas targeted by this assay, and inadequate number of viral copies  (<250 copies / mL). A negative result must be combined with clinical  observations, patient history, and epidemiological information. If result is POSITIVE SARS-CoV-2 target nucleic acids are DETECTED. The SARS-CoV-2 RNA is generally detectable in upper and lower  respiratory specimens dur ing the acute phase of infection.  Positive  results are indicative of active infection with SARS-CoV-2.  Clinical  correlation with patient history and other diagnostic information is  necessary to determine patient infection status.  Positive results do  not rule out bacterial infection or co-infection with other viruses. If result is PRESUMPTIVE POSTIVE SARS-CoV-2 nucleic acids MAY BE PRESENT.   A presumptive positive result was obtained on the submitted specimen  and confirmed on repeat testing.  While 2019 novel coronavirus  (SARS-CoV-2) nucleic acids may be present in the submitted sample  additional confirmatory testing may be necessary for epidemiological  and / or clinical management purposes  to differentiate between  SARS-CoV-2 and other Sarbecovirus currently known to infect humans.  If clinically indicated additional testing with an alternate test  methodology [phone]) is advised. The SARS-CoV-2 RNA is generally  detectable in upper and lower respiratory sp ecimens during the acute  phase of infection. The expected result is Negative. Fact Sheet for  Patients:  StrictlyIdeas.no Fact Sheet for Healthcare Providers: BankingDealers.co.za This test is not yet approved or cleared by the [place] FDA and has been authorized for detection and/or diagnosis of SARS-CoV-2 by FDA under an Emergency Use Authorization (EUA).  This EUA will remain in effect (meaning this test can be used) for the duration of the  COVID-19 declaration under Section 564(b)(1) of the Act, 21 U.S.C. section 360bbb-3(b)(1), unless the authorization is terminated or revoked sooner. Performed at [facility], [place], [place] [address]    Studies/Results: Nm Gi Blood Loss  Result Date: 11/21/2018 CLINICAL DATA:  GI bleeding diarrhea EXAM: NUCLEAR MEDICINE GASTROINTESTINAL BLEEDING SCAN TECHNIQUE: Sequential abdominal images were obtained following intravenous administration of Tc-34m labeled red blood cells. RADIOPHARMACEUTICALS:  21.41 mCi Tc-35m pertechnetate in-vitro labeled red cells. COMPARISON:  CT 11/20/2018 FINDINGS: Normal blood pool activity. Focal activity in the region of the penis. Faint bladder activity noted. Activity present within the distal descending/proximal sigmoid colon with progression of activity through the sigmoid colon. Configuration of activity appears congruent with the anatomy of the colon on the comparison CT. IMPRESSION: Positive for active GI bleeding, suspected source is the distal descending/proximal sigmoid colon. Critical Value/emergent results were called by telephone at the time of interpretation on 11/21/2018 at 1:20 am to Dr. Sidney Ace, who verbally acknowledged these results. Electronically Signed   By: Donavan Foil M.D.   On: 11/21/2018 01:21   Medications:  Scheduled Meds: . allopurinol  300 mg Oral Daily  . docusate sodium  100 mg Oral BID  . pantoprazole  40 mg Oral Daily  . [START ON 11/23/2018] pantoprazole  40 mg Intravenous Q12H  . sodium chloride flush  3 mL Intravenous Q12H   Continuous Infusions: . sodium chloride     PRN Meds:.sodium chloride, acetaminophen **OR** acetaminophen, HYDROmorphone (DILAUDID) injection, meclizine, ondansetron **OR** ondansetron (ZOFRAN) IV, ondansetron (ZOFRAN) IV, sodium chloride flush, technetium labeled red blood cells   Assessment: Active Problems:   GI bleed    Plan: No further active GI bleeding since  embolization Patient should follow-up closely with primary care provider within 1 to 2 weeks of discharge for repeat hemoglobin check  High-fiber diet MiraLAX or Metamucil daily with goal of 1-2 soft bowel movements daily.  If not at goal, patient instructed to increase dose to twice daily.  If loose stools with the medication, patient asked to decrease the medication to every other day, or half dose daily.  Patient verbalized understanding    LOS: 2 days   Phillip Antigua, MD 11/22/2018, 3:37 PM

## 2018-11-22 NOTE — Discharge Summary (Signed)
[facility] NAME: Phillip Nelson    MR#:  [id number]  DATE OF BIRTH:  1935-02-10  DATE OF ADMISSION:  11/19/2018 ADMITTING PHYSICIAN: Harrie Foreman, MD  DATE OF DISCHARGE: 11/22/2018   PRIMARY CARE PHYSICIAN: Olin Hauser, DO    ADMISSION DIAGNOSIS:  GI bleed [K92.2]  DISCHARGE DIAGNOSIS:  Active Problems:   GI bleed   SECONDARY DIAGNOSIS:   Past Medical History:  Diagnosis Date  . Angina at rest [facility])   . Aural vertigo   . Coronary artery disease [place]  . ED (erectile dysfunction)   . Gout, arthritis   . History of skin cancer   . Hyperlipidemia   . OSA (obstructive sleep apnea)    CPAP NON-COMPLIANT  . Prostate cancer ([facility]) 05/08/12   Adenocarcinoma,gleason=3+3=6,PSA=8.26,vol=57cc    HOSPITAL COURSE:   This is an 84 year old male admitted for GI bleeding. 1. GI bleed: The patient has had some melena but also hematochezia. He is hemodynamically stable.   Hydrate with intravenous fluid. Maintain 2 large-bore IVs. IV Protonix.  Appreciated GI and surgery consult. Patient had positive bleeding scan so taken by vascular surgery and done embolization of left colic artery and superior segment artery.  no more bleed, hb stable.  GI advise to have stool softners daily and follow Hb in 1-2 weeks. 2. Gout: Stable; continue allopurinol 3. History of coronary dissection: The patient underwent cardiac surgery in 2005. Heart catheterization showed no significant coronary artery disease. 4. Obesity: BMI is 30.8; encourage healthy diet and exercise 5. DVT prophylaxis: SCDs 6. GI prophylaxis: As above  DISCHARGE CONDITIONS:   Stable.  CONSULTS OBTAINED:  Treatment Team:  Virgel Manifold, MD Benjamine Sprague, DO  DRUG ALLERGIES:   Allergies  Allergen Reactions  . Indocin [Indomethacin] Other (See Comments)    Shakes. tremors  . Penicillins Itching, Rash and Other (See  Comments)    Did it involve swelling of the face/tongue/throat, SOB, or low BP? Unknown Did it involve sudden or severe rash/hives, skin peeling, or any reaction on the inside of your mouth or nose? Unknown Did you need to seek medical attention at a hospital or doctor's office? Unknown When did it last happen?unknown If all above answers are "NO", may proceed with cephalosporin use.     DISCHARGE MEDICATIONS:   Allergies as of 11/22/2018      Reactions   Indocin [indomethacin] Other (See Comments)   Shakes. tremors   Penicillins Itching, Rash, Other (See Comments)   Did it involve swelling of the face/tongue/throat, SOB, or low BP? Unknown Did it involve sudden or severe rash/hives, skin peeling, or any reaction on the inside of your mouth or nose? Unknown Did you need to seek medical attention at a hospital or doctor's office? Unknown When did it last happen?unknown If all above answers are "NO", may proceed with cephalosporin use.      Medication List    TAKE these medications   allopurinol 300 MG tablet Commonly known as: ZYLOPRIM TAKE ONE (1) TABLET BY MOUTH EVERY DAY AS DIRECTED What changed: See the new instructions. Notes to patient: Morning 11/23/18   meclizine 25 MG tablet Commonly known as: ANTIVERT Take 25 mg by mouth 3 (three) times daily as needed for dizziness. Notes to patient: As needed   pantoprazole 40 MG tablet Commonly known as: PROTONIX Take 1 tablet (40 mg total) by mouth daily.   polyethylene glycol 17 g packet Commonly known as:  MiraLax Take 17 g by mouth daily.   Potassium 99 MG Tabs Take 396 mg by mouth daily. Notes to patient: Morning 11/23/18        DISCHARGE INSTRUCTIONS:    Follow with PMD in 1-2 weeks.  If you experience worsening of your admission symptoms, develop shortness of breath, life threatening emergency, suicidal or homicidal thoughts you must seek medical attention immediately by calling 911 or calling your MD  immediately  if symptoms less severe.  You Must read complete instructions/literature along with all the possible adverse reactions/side effects for all the Medicines you take and that have been prescribed to you. Take any new Medicines after you have completely understood and accept all the possible adverse reactions/side effects.   Please note  You were cared for by a hospitalist during your hospital stay. If you have any questions about your discharge medications or the care you received while you were in the hospital after you are discharged, you can call the unit and asked to speak with the hospitalist on call if the hospitalist that took care of you is not available. Once you are discharged, your primary care physician will handle any further medical issues. Please note that NO REFILLS for any discharge medications will be authorized once you are discharged, as it is imperative that you return to your primary care physician (or establish a relationship with a primary care physician if you do not have one) for your aftercare needs so that they can reassess your need for medications and monitor your lab values.    Today   CHIEF COMPLAINT:   Chief Complaint  Patient presents with  . GI Bleeding    HISTORY OF PRESENT ILLNESS:  Phillip Nelson  is a 84 y.o. male with a known history of gout and a spontaneous coronary artery dissection presents to the emergency department complaining of blood in his stool.  The patient reports having a melanotic stool approximately 12 hours prior to admission.  He subsequently had 9 grossly bloody stools in total prior to admission.  He admits to feeling lightheaded after 1 of his bowel movements at home.  Otherwise he feels well.  The patient's bleeding has been painless and he denies nausea or vomiting.  IV Protonix was started in the emergency department prior to the hospitalist service being called for admission.   VITAL SIGNS:  Blood pressure 125/66,  pulse (!) 106, temperature 97.8 F (36.6 C), temperature source Oral, resp. rate 19, height 5\' 10"  (1.778 m), weight 98.3 kg, SpO2 100 %.  I/O:    Intake/Output Summary (Last 24 hours) at 11/22/2018 1711 Last data filed at 11/22/2018 0900 Gross per 24 hour  Intake 840 ml  Output -  Net 840 ml    PHYSICAL EXAMINATION:   GENERAL:  84 y.o.-year-old patient lying in the bed with no acute distress.  EYES: Pupils equal, round, reactive to light and accommodation. No scleral icterus. Extraocular muscles intact.  HEENT: Head atraumatic, normocephalic. Oropharynx and nasopharynx clear.  NECK:  Supple, no jugular venous distention. No thyroid enlargement, no tenderness.  LUNGS: Normal breath sounds bilaterally, no wheezing, rales,rhonchi or crepitation. No use of accessory muscles of respiration.  CARDIOVASCULAR: S1, S2 normal. No murmurs, rubs, or gallops.  ABDOMEN: Soft, nontender, nondistended. Bowel sounds present. No organomegaly or mass.  EXTREMITIES: No pedal edema, cyanosis, or clubbing.  NEUROLOGIC: Cranial nerves II through XII are intact. Muscle strength 4/5 in all extremities. Sensation intact. Gait not checked.  PSYCHIATRIC: The patient  is alert and oriented x 3.  SKIN: No obvious rash, lesion, or ulcer.   DATA REVIEW:   CBC Recent Labs  Lab 11/22/18 0913  WBC 17.8*  HGB 8.9*  HCT 26.8*  PLT 245    Chemistries  Recent Labs  Lab 11/19/18 2300  11/22/18 0913  NA 136   < > 139  K 4.0   < > 3.5  CL 105   < > 108  CO2 22   < > 21*  GLUCOSE 114*   < > 127*  BUN 23   < > 8  CREATININE 1.16   < > 1.00  CALCIUM 8.9   < > 8.3*  AST 41  --   --   ALT 33  --   --   ALKPHOS 52  --   --   BILITOT 1.0  --   --    < > = values in this interval not displayed.    Cardiac Enzymes No results for input(s): TROPONINI in the last 168 hours.  Microbiology Results  Results for orders placed or performed during the hospital encounter of 11/19/18  SARS Coronavirus 2 [facility]  order, Performed in [place] hospital lab)     Status: None   Collection Time: 11/19/18 11:32 PM  Result Value Ref Range Status   SARS Coronavirus 2 NEGATIVE NEGATIVE Final    Comment: (NOTE) If result is NEGATIVE SARS-CoV-2 target nucleic acids are NOT DETECTED. The SARS-CoV-2 RNA is generally detectable in upper and lower  respiratory specimens during the acute phase of infection. The lowest  concentration of SARS-CoV-2 viral copies this assay can detect is 250  copies / mL. A negative result does not preclude SARS-CoV-2 infection  and should not be used as the sole basis for treatment or other  patient management decisions.  A negative result may occur with  improper specimen collection / handling, submission of specimen other  than nasopharyngeal swab, presence of viral mutation(s) within the  areas targeted by this assay, and inadequate number of viral copies  (<250 copies / mL). A negative result must be combined with clinical  observations, patient history, and epidemiological information. If result is POSITIVE SARS-CoV-2 target nucleic acids are DETECTED. The SARS-CoV-2 RNA is generally detectable in upper and lower  respiratory specimens dur ing the acute phase of infection.  Positive  results are indicative of active infection with SARS-CoV-2.  Clinical  correlation with patient history and other diagnostic information is  necessary to determine patient infection status.  Positive results do  not rule out bacterial infection or co-infection with other viruses. If result is PRESUMPTIVE POSTIVE SARS-CoV-2 nucleic acids MAY BE PRESENT.   A presumptive positive result was obtained on the submitted specimen  and confirmed on repeat testing.  While 2019 novel coronavirus  (SARS-CoV-2) nucleic acids may be present in the submitted sample  additional confirmatory testing may be necessary for epidemiological  and / or clinical management purposes  to differentiate between   SARS-CoV-2 and other Sarbecovirus currently known to infect humans.  If clinically indicated additional testing with an alternate test  methodology [phone]) is advised. The SARS-CoV-2 RNA is generally  detectable in upper and lower respiratory sp ecimens during the acute  phase of infection. The expected result is Negative. Fact Sheet for Patients:  StrictlyIdeas.no Fact Sheet for Healthcare Providers: BankingDealers.co.za This test is not yet approved or cleared by the [place] FDA and has been authorized for detection and/or diagnosis of SARS-CoV-2  by FDA under an Emergency Use Authorization (EUA).  This EUA will remain in effect (meaning this test can be used) for the duration of the COVID-19 declaration under Section 564(b)(1) of the Act, 21 U.S.C. section 360bbb-3(b)(1), unless the authorization is terminated or revoked sooner. Performed at [facility], [address] [place], [place] [address]     RADIOLOGY:  Nm Gi Blood Loss  Result Date: 11/21/2018 CLINICAL DATA:  GI bleeding diarrhea EXAM: NUCLEAR MEDICINE GASTROINTESTINAL BLEEDING SCAN TECHNIQUE: Sequential abdominal images were obtained following intravenous administration of Tc-49m labeled red blood cells. RADIOPHARMACEUTICALS:  21.41 mCi Tc-72m pertechnetate in-vitro labeled red cells. COMPARISON:  CT 11/20/2018 FINDINGS: Normal blood pool activity. Focal activity in the region of the penis. Faint bladder activity noted. Activity present within the distal descending/proximal sigmoid colon with progression of activity through the sigmoid colon. Configuration of activity appears congruent with the anatomy of the colon on the comparison CT. IMPRESSION: Positive for active GI bleeding, suspected source is the distal descending/proximal sigmoid colon. Critical Value/emergent results were called by telephone at the time of interpretation on 11/21/2018 at 1:20 am to Dr.  Sidney Ace, who verbally acknowledged these results. Electronically Signed   By: Donavan Foil M.D.   On: 11/21/2018 01:21    EKG:   Orders placed or performed during the hospital encounter of 11/19/18  . EKG 12-Lead  . EKG 12-Lead      Management plans discussed with the patient, family and they are in agreement.  CODE STATUS:     Code Status Orders  (From admission, onward)         Start     Ordered   11/20/18 0417  Full code  Continuous     11/20/18 0416        Code Status History    This patient has a current code status but no historical code status.   Advance Care Planning Activity      TOTAL TIME TAKING CARE OF THIS PATIENT: 35 minutes.    Vaughan Basta M.D on 11/22/2018 at 5:11 PM  Between 7am to 6pm - Pager - [phone]  After 6pm go to [email or web address] - password [email or web address] [facility]  Office  [phone]  CC: Primary care physician; Olin Hauser, DO   Note: This dictation was prepared with Dragon dictation along with smaller phrase technology. Any transcriptional errors that result from this process are unintentional.

## 2018-11-25 ENCOUNTER — Telehealth: Payer: Self-pay

## 2018-11-25 NOTE — Telephone Encounter (Signed)
I have made the 2nd attempt to contact the patient or family member in charge, in order to follow up from recently being discharged from the hospital. I left a message on voicemail but I will make another attempt at a different time.  

## 2018-11-25 NOTE — Telephone Encounter (Addendum)
Addend 11/25/2018 at 8:30am  I have made the 1st attempt to contact the patient or family member in charge, in order to follow up from recently being discharged from the hospital. I left a message on voicemail but I will make another attempt at a different time.

## 2018-12-03 ENCOUNTER — Other Ambulatory Visit: Payer: Self-pay

## 2018-12-03 ENCOUNTER — Encounter: Payer: Self-pay | Admitting: Family Medicine

## 2018-12-03 ENCOUNTER — Ambulatory Visit (INDEPENDENT_AMBULATORY_CARE_PROVIDER_SITE_OTHER): Payer: Medicare Other | Admitting: Family Medicine

## 2018-12-03 VITALS — BP 121/72 | HR 91 | Temp 98.5°F | Resp 20 | Ht 70.0 in | Wt 217.0 lb

## 2018-12-03 DIAGNOSIS — D62 Acute posthemorrhagic anemia: Secondary | ICD-10-CM

## 2018-12-03 DIAGNOSIS — K922 Gastrointestinal hemorrhage, unspecified: Secondary | ICD-10-CM | POA: Diagnosis not present

## 2018-12-03 DIAGNOSIS — C61 Malignant neoplasm of prostate: Secondary | ICD-10-CM

## 2018-12-03 NOTE — Patient Instructions (Addendum)
Thank you for coming to the office today.  Lab today for hemoglobin iron level and PSA prostate test  Take iron pill with food  Try iron rich diet  Keep up follow-up with Vascular doctors and possibly GI doctors   Please schedule a Follow-up Appointment to: Return in about 6 months (around 06/02/2019) for 6 months follow-up .  If you have any other questions or concerns, please feel free to call the office or send a message through [place]. You may also schedule an earlier appointment if necessary.  Additionally, you may be receiving a survey about your experience at our office within a few days to 1 week by e-mail or mail. We value your feedback.  [name], DO [facility], [facility]   Iron-Rich Diet  Iron is a mineral that helps your body to produce hemoglobin. Hemoglobin is a protein in red blood cells that carries oxygen to your body's tissues. Eating too little iron may cause you to feel weak and tired, and it can increase your risk of infection. Iron is naturally found in many foods, and many foods have iron added to them (iron-fortified foods). You may need to follow an iron-rich diet if you do not have enough iron in your body due to certain medical conditions. The amount of iron that you need each day depends on your age, your sex, and any medical conditions you have. Follow instructions from your health care provider or a diet and nutrition specialist (dietitian) about how much iron you should eat each day. What are tips for following this plan? Reading food labels  Check food labels to see how many milligrams (mg) of iron are in each serving. Cooking  Cook foods in pots and pans that are made from iron.  Take these steps to make it easier for your body to absorb iron from certain foods: ? Soak beans overnight before cooking. ? Soak whole grains overnight and drain them before using. ? Ferment flours before baking, such as by using yeast in bread  dough. Meal planning  When you eat foods that contain iron, you should eat them with foods that are high in vitamin C. These include oranges, peppers, tomatoes, potatoes, and mango. Vitamin C helps your body to absorb iron. General information  Take iron supplements only as told by your health care provider. An overdose of iron can be life-threatening. If you were prescribed iron supplements, take them with orange juice or a vitamin C supplement.  When you eat iron-fortified foods or take an iron supplement, you should also eat foods that naturally contain iron, such as meat, poultry, and fish. Eating naturally iron-rich foods helps your body to absorb the iron that is added to other foods or contained in a supplement.  Certain foods and drinks prevent your body from absorbing iron properly. Avoid eating these foods in the same meal as iron-rich foods or with iron supplements. These foods include: ? Coffee, black tea, and red wine. ? Milk, dairy products, and foods that are high in calcium. ? Beans and soybeans. ? Whole grains. What foods should I eat? Fruits Prunes. Raisins. Eat fruits high in vitamin C, such as oranges, grapefruits, and strawberries, alongside iron-rich foods. Vegetables Spinach (cooked). Green peas. Broccoli. Fermented vegetables. Eat vegetables high in vitamin C, such as leafy greens, potatoes, bell peppers, and tomatoes, alongside iron-rich foods. Grains Iron-fortified breakfast cereal. Iron-fortified whole-wheat bread. Enriched rice. Sprouted grains. Meats and other proteins Beef liver. Oysters. Beef. Shrimp. [place]. Chicken.  Tuna. Sardines. Chickpeas. Nuts. Tofu. Pumpkin seeds. Beverages Tomato juice. Fresh orange juice. Prune juice. Hibiscus tea. Fortified instant breakfast shakes. Sweets and desserts Blackstrap molasses. Seasonings and condiments Tahini. Fermented soy sauce. Other foods Wheat germ. The items listed above may not be a complete list of  recommended foods and beverages. Contact a dietitian for more information. What foods should I avoid? Grains Whole grains. Bran cereal. Bran flour. Oats. Meats and other proteins Soybeans. Products made from soy protein. Black beans. Lentils. Mung beans. Split peas. Dairy Milk. Cream. Cheese. Yogurt. Cottage cheese. Beverages Coffee. Black tea. Red wine. Sweets and desserts Cocoa. Chocolate. Ice cream. Other foods Basil. Oregano. Large amounts of parsley. The items listed above may not be a complete list of foods and beverages to avoid. Contact a dietitian for more information. Summary  Iron is a mineral that helps your body to produce hemoglobin. Hemoglobin is a protein in red blood cells that carries oxygen to your body's tissues.  Iron is naturally found in many foods, and many foods have iron added to them (iron-fortified foods).  When you eat foods that contain iron, you should eat them with foods that are high in vitamin C. Vitamin C helps your body to absorb iron.  Certain foods and drinks prevent your body from absorbing iron properly, such as whole grains and dairy products. You should avoid eating these foods in the same meal as iron-rich foods or with iron supplements. This information is not intended to replace advice given to you by your health care provider. Make sure you discuss any questions you have with your health care provider. Document Released: 10/25/2004 Document Revised: 02/23/2017 Document Reviewed: 02/06/2017 Elsevier Patient Education  2020 [employer].

## 2018-12-03 NOTE — Progress Notes (Signed)
Subjective:    Patient ID: Phillip Nelson, male    DOB: 04/16/1934, 84 y.o.   MRN: [id number]  Phillip Nelson is a 84 y.o. male presenting on 12/03/2018 for Hospitalization Follow-up (ruptured blood vessels in colon)   HPI  HOSPITAL FOLLOW-UP VISIT  Hospital/Location: [facility] Date of Admission: 11/19/18 Date of Discharge: 11/22/18 Transitions of care telephone call: Attempted by Tyler Aas LPN on [id number]  Reason for Admission: GI Bleed Primary (+Secondary) Diagnosis: Acute GIB, with acute blood loss anemia  - Hospital H&P and Discharge Summary have been reviewed - Patient presents today 11 days after recent hospitalization. Brief summary of recent course, patient had symptoms of identified melena dark blood black in toilet after BM - hospitalized, consulted with GI, had positive bleeding scan by vascular surgery and embolization of left colic artery and superior segment artery, bleeding had resolved. - Hemoglobin trend down to 8.9, no PRBC in hospital. Due for repeat CBC now  - Today reports overall has done well after discharge. Symptoms of GI Bleeding and blood in stool has RESOLVED. Now gradual improve fatigue and reduced energy but still not normal yet.  - New medications on discharge: iron supplement - Changes to current meds on discharge: none  I have reviewed the discharge medication list, and have reconciled the current and discharge medications today.   Current Outpatient Medications:  .  allopurinol (ZYLOPRIM) 300 MG tablet, TAKE ONE (1) TABLET BY MOUTH EVERY DAY AS DIRECTED (Patient taking differently: Take 300 mg by mouth daily. ), Disp: 30 tablet, Rfl: 3 .  meclizine (ANTIVERT) 25 MG tablet, Take 25 mg by mouth 3 (three) times daily as needed for dizziness. , Disp: , Rfl:  .  polyethylene glycol (MIRALAX) 17 g packet, Take 17 g by mouth daily., Disp: 14 each, Rfl: 0 .  Potassium 99 MG TABS, Take 396 mg by mouth daily. , Disp: , Rfl:  .  pantoprazole (PROTONIX)  40 MG tablet, Take 1 tablet (40 mg total) by mouth daily. (Patient not taking: Reported on 12/03/2018), Disp: 30 tablet, Rfl: 0  ------------------------------------------------------------------------- Social History   Tobacco Use  . Smoking status: Former Smoker    Packs/day: 3.00    Years: 31.00    Pack years: 93.00    Quit date: 08/01/1990    Years since quitting: 28.3  . Smokeless tobacco: Never Used  Substance Use Topics  . Alcohol use: No    Comment: rarely  6 beers year,  . Drug use: No    Review of Systems Per HPI unless specifically indicated above     Objective:    BP 121/72 (BP Location: Left Arm, Patient Position: Sitting, Cuff Size: Normal)   Pulse 91   Temp 98.5 F (36.9 C) (Oral)   Resp 20   Ht 5\' 10"  (1.778 m)   Wt 217 lb (98.4 kg)   SpO2 100%   BMI 31.14 kg/m   Wt Readings from Last 3 Encounters:  12/03/18 217 lb (98.4 kg)  11/22/18 216 lb 11.4 oz (98.3 kg)  12/11/17 220 lb 12.8 oz (100.2 kg)    Physical Exam Vitals signs and nursing note reviewed.  Constitutional:      General: He is not in acute distress.    Appearance: He is well-developed. He is not diaphoretic.     Comments: Well-appearing, comfortable, cooperative  HENT:     Head: Normocephalic and atraumatic.  Eyes:     General:        Right eye:  No discharge.        Left eye: No discharge.     Conjunctiva/sclera: Conjunctivae normal.  Neck:     Musculoskeletal: Normal range of motion and neck supple.     Thyroid: No thyromegaly.  Cardiovascular:     Rate and Rhythm: Normal rate and regular rhythm.     Heart sounds: Normal heart sounds. No murmur.  Pulmonary:     Effort: Pulmonary effort is normal. No respiratory distress.     Breath sounds: Normal breath sounds. No wheezing or rales.  Musculoskeletal: Normal range of motion.  Lymphadenopathy:     Cervical: No cervical adenopathy.  Skin:    General: Skin is warm and dry.     Findings: No erythema or rash.  Neurological:      Mental Status: He is alert and oriented to person, place, and time.  Psychiatric:        Behavior: Behavior normal.     Comments: Well groomed, good eye contact, normal speech and thoughts      I have personally reviewed the radiology report from 11/21/18.  CLINICAL DATA:  GI bleeding diarrhea  EXAM: NUCLEAR MEDICINE GASTROINTESTINAL BLEEDING SCAN  TECHNIQUE: Sequential abdominal images were obtained following intravenous administration of Tc-45m labeled red blood cells.  RADIOPHARMACEUTICALS:  21.41 mCi Tc-52m pertechnetate in-vitro labeled red cells.  COMPARISON:  CT 11/20/2018  FINDINGS: Normal blood pool activity. Focal activity in the region of the penis. Faint bladder activity noted.  Activity present within the distal descending/proximal sigmoid colon with progression of activity through the sigmoid colon. Configuration of activity appears congruent with the anatomy of the colon on the comparison CT.  IMPRESSION: Positive for active GI bleeding, suspected source is the distal descending/proximal sigmoid colon.  Critical Value/emergent results were called by telephone at the time of interpretation on 11/21/2018 at 1:20 am to Dr. Sidney Ace, who verbally acknowledged these results.   Electronically Signed   By: Donavan Foil M.D.   On: 11/21/2018 01:21   Results for orders placed or performed during the hospital encounter of 11/19/18  SARS Coronavirus 2 [facility] order, Performed in [facility] hospital lab)  Result Value Ref Range   SARS Coronavirus 2 NEGATIVE NEGATIVE  Comprehensive metabolic panel  Result Value Ref Range   Sodium 136 135 - 145 mmol/L   Potassium 4.0 3.5 - 5.1 mmol/L   Chloride 105 98 - 111 mmol/L   CO2 22 22 - 32 mmol/L   Glucose, Bld 114 (H) 70 - 99 mg/dL   BUN 23 8 - 23 mg/dL   Creatinine, Ser 1.16 0.61 - 1.24 mg/dL   Calcium 8.9 8.9 - 10.3 mg/dL   Total Protein 6.7 6.5 - 8.1 g/dL   Albumin 3.9 3.5 - 5.0 g/dL   AST 41 15 -  41 U/L   ALT 33 0 - 44 U/L   Alkaline Phosphatase 52 38 - 126 U/L   Total Bilirubin 1.0 0.3 - 1.2 mg/dL   GFR calc non Af Amer 59 (L) >60 mL/min   GFR calc Af Amer >60 >60 mL/min   Anion gap 9 5 - 15  CBC  Result Value Ref Range   WBC 12.0 (H) 4.0 - 10.5 K/uL   RBC 3.87 (L) 4.22 - 5.81 MIL/uL   Hemoglobin 13.0 13.0 - 17.0 g/dL   HCT 37.9 (L) 39.0 - 52.0 %   MCV 97.9 80.0 - 100.0 fL   MCH 33.6 26.0 - 34.0 pg   MCHC 34.3 30.0 - 36.0  g/dL   RDW 12.6 11.5 - 15.5 %   Platelets 249 150 - 400 K/uL   nRBC 0.0 0.0 - 0.2 %  Protime-INR  Result Value Ref Range   Prothrombin Time 13.2 11.4 - 15.2 seconds   INR 1.0 0.8 - 1.2  TSH  Result Value Ref Range   TSH 0.986 0.350 - 4.500 uIU/mL  Hemoglobin A1c  Result Value Ref Range   Hgb A1c MFr Bld 5.7 (H) 4.8 - 5.6 %   Mean Plasma Glucose 116.89 mg/dL  Hemoglobin  Result Value Ref Range   Hemoglobin 11.8 (L) 13.0 - 17.0 g/dL  Hemoglobin  Result Value Ref Range   Hemoglobin 10.9 (L) 13.0 - 17.0 g/dL  Hemoglobin  Result Value Ref Range   Hemoglobin 9.4 (L) 13.0 - 17.0 g/dL  Hemoglobin and hematocrit, blood  Result Value Ref Range   Hemoglobin 9.8 (L) 13.0 - 17.0 g/dL   HCT 29.2 (L) 39.0 - [id number] %  Basic metabolic panel  Result Value Ref Range   Sodium 140 135 - 145 mmol/L   Potassium 4.2 3.5 - 5.1 mmol/L   Chloride 108 98 - 111 mmol/L   CO2 24 22 - 32 mmol/L   Glucose, Bld 97 70 - 99 mg/dL   BUN 15 8 - 23 mg/dL   Creatinine, Ser 0.98 0.61 - 1.24 mg/dL   Calcium 8.1 (L) 8.9 - 10.3 mg/dL   GFR calc non Af Amer >60 >60 mL/min   GFR calc Af Amer >60 >60 mL/min   Anion gap 8 5 - 15  Glucose, capillary  Result Value Ref Range   Glucose-Capillary 88 70 - 99 mg/dL  CBC  Result Value Ref Range   WBC 17.8 (H) 4.0 - 10.5 K/uL   RBC 2.64 (L) 4.22 - 5.81 MIL/uL   Hemoglobin 8.9 (L) 13.0 - 17.0 g/dL   HCT 26.8 (L) 39.0 - 52.0 %   MCV 101.5 (H) 80.0 - 100.0 fL   MCH 33.7 26.0 - 34.0 pg   MCHC 33.2 30.0 - 36.0 g/dL   RDW 13.0 11.5 -  15.5 %   Platelets 245 150 - 400 K/uL   nRBC 0.0 0.0 - 0.2 %  Basic metabolic panel  Result Value Ref Range   Sodium 139 135 - 145 mmol/L   Potassium 3.5 3.5 - 5.1 mmol/L   Chloride 108 98 - 111 mmol/L   CO2 21 (L) 22 - 32 mmol/L   Glucose, Bld 127 (H) 70 - 99 mg/dL   BUN 8 8 - 23 mg/dL   Creatinine, Ser 1.00 0.61 - 1.24 mg/dL   Calcium 8.3 (L) 8.9 - 10.3 mg/dL   GFR calc non Af Amer >60 >60 mL/min   GFR calc Af Amer >60 >60 mL/min   Anion gap 10 5 - 15  Glucose, capillary  Result Value Ref Range   Glucose-Capillary 109 (H) 70 - 99 mg/dL  Type and screen [place]  Result Value Ref Range   ABO/RH(D) O POS    Antibody Screen NEG    Sample Expiration      11/22/2018,2359 Performed at [facility], [address] [place], [place] [address]       Assessment & Plan:   Problem List Items Addressed This Visit    Prostate cancer [facility]) S/p prostate cancer seed implant 2014 Previously followed by [facility] on surveillance yearly PSA here from PCP Last negative 10/2017  Due now for repeat will check PSA  with lab today   Relevant Orders   PSA    Other Visit Diagnoses    Lower GI bleed    -  Primary   Relevant Orders   CBC with Differential/Platelet   Acute blood loss anemia       Relevant Orders   CBC with Differential/Platelet    Resolving - Acute GIB now with no further episodes of bleeding or melena Clinically improving ABLA on oral iron and iron rich diet Last Hgb 8.9 on 11/22/18  Repeat CBC today, f/u trend Hgb Follow up with vascular / GI as scheduled. Continue iron sulfate 1-2 times daily for now then PRN in future, anticipate will get back to usual routine strength Follow-up as needed sooner if not improving as expected or recurrence   No orders of the defined types were placed in this encounter.   Follow up plan: Return in about 6 months (around 06/02/2019) for 6 months follow-up .   [name], [facility] 12/03/2018, 11:12 AM

## 2018-12-04 LAB — CBC WITH DIFFERENTIAL/PLATELET
Absolute Monocytes: 896 cells/uL (ref 200–950)
Basophils Absolute: 51 cells/uL (ref 0–200)
Basophils Relative: 0.4 %
Eosinophils Absolute: 346 cells/uL (ref 15–500)
Eosinophils Relative: 2.7 %
HCT: 32.3 % — ABNORMAL LOW (ref 38.5–50.0)
Hemoglobin: 10.7 g/dL — ABNORMAL LOW (ref 13.2–17.1)
Lymphs Abs: 2522 cells/uL (ref 850–3900)
MCH: 32.3 pg (ref 27.0–33.0)
MCHC: 33.1 g/dL (ref 32.0–36.0)
MCV: 97.6 fL (ref 80.0–100.0)
MPV: 9.3 fL (ref 7.5–12.5)
Monocytes Relative: 7 %
Neutro Abs: 8986 cells/uL — ABNORMAL HIGH (ref 1500–7800)
Neutrophils Relative %: 70.2 %
Platelets: 510 10*3/uL — ABNORMAL HIGH (ref 140–400)
RBC: 3.31 10*6/uL — ABNORMAL LOW (ref 4.20–5.80)
RDW: 12.9 % (ref 11.0–15.0)
Total Lymphocyte: 19.7 %
WBC: 12.8 10*3/uL — ABNORMAL HIGH (ref 3.8–10.8)

## 2018-12-04 LAB — PSA: PSA: <0.1 ng/mL (ref <=4.0)

## 2018-12-09 ENCOUNTER — Other Ambulatory Visit: Payer: Self-pay | Admitting: Family Medicine

## 2018-12-09 DIAGNOSIS — H43813 Vitreous degeneration, bilateral: Secondary | ICD-10-CM | POA: Diagnosis not present

## 2018-12-10 ENCOUNTER — Ambulatory Visit (INDEPENDENT_AMBULATORY_CARE_PROVIDER_SITE_OTHER): Payer: Medicare Other | Admitting: Gastroenterology

## 2018-12-10 ENCOUNTER — Other Ambulatory Visit: Payer: Self-pay

## 2018-12-10 ENCOUNTER — Encounter: Payer: Self-pay | Admitting: Gastroenterology

## 2018-12-10 VITALS — BP 115/75 | HR 91 | Temp 97.7°F | Wt 220.1 lb

## 2018-12-10 DIAGNOSIS — D649 Anemia, unspecified: Secondary | ICD-10-CM | POA: Diagnosis not present

## 2018-12-10 NOTE — Progress Notes (Signed)
Vonda Antigua, MD [address]  [place]  [place], [place] [address]  Main: [phone]  Fax: [phone]   Primary Care Physician: Olin Hauser, DO   Chief Complaint  Patient presents with  . Hospitalization Follow-up    Patient was seen for hospital follow up for the GI bleed. He states since hospital visit he has had no symptoms      HPI: Phillip Nelson is a 84 y.o. male here for hospital follow up of hematochezia. Pt had painless rectal bleeding and RBC scan was positive in the distal descending/proximal sigmoid colon. Pt underwent Embolization with no symptoms since then. Hgb improved to above 10 last week. Has not been taking miralax regularly. Reports "stiff" stools at times. But no bleeding.   The patient denies abdominal or flank pain, anorexia, nausea or vomiting, dysphagia, change in bowel habits or black or bloody stools or weight loss.  Reports 1 similar history of bleeding in the past in 2008 and states it was due to diverticulosis related bleeding.   Colonoscopy 2018 with Dr. Jamal Collin for history of polyps, with 2 subcentimeter polyps removed, extent of exam cecum, diverticulosis reported, repeat recommended in 5 years.  Colonoscopy in 2008 by Dr. Jamal Collin done for rectal bleeding, scope advanced to the transverse colon, reported with multiple large bowel diverticula with no active bleeding.  Colonoscopy 2003 with Dr. Jamal Collin done for Hemoccult positive stool, with scope advanced to transverse colon, showed diverticulosis.    Current Outpatient Medications  Medication Sig Dispense Refill  . allopurinol (ZYLOPRIM) 300 MG tablet TAKE ONE (1) TABLET BY MOUTH EVERY DAY AS DIRECTED (Patient taking differently: Take 300 mg by mouth daily. ) 30 tablet 3  . Potassium 99 MG TABS Take 396 mg by mouth daily.      No current facility-administered medications for this visit.     Allergies as of 12/10/2018 - Review Complete 12/10/2018  Allergen  Reaction Noted  . Indocin [indomethacin] Other (See Comments) 06/05/2012  . Penicillins Itching, Rash, and Other (See Comments) 06/05/2012    ROS:  General: Negative for anorexia, weight loss, fever, chills, fatigue, weakness. ENT: Negative for hoarseness, difficulty swallowing , nasal congestion. CV: Negative for chest pain, angina, palpitations, dyspnea on exertion, peripheral edema.  Respiratory: Negative for dyspnea at rest, dyspnea on exertion, cough, sputum, wheezing.  GI: See history of present illness. GU:  Negative for dysuria, hematuria, urinary incontinence, urinary frequency, nocturnal urination.  Endo: Negative for unusual weight change.    Physical Examination:   BP 115/75 (BP Location: Left Arm, Patient Position: Sitting, Cuff Size: Normal)   Pulse 91   Temp 97.7 F (36.5 C) (Tympanic)   Wt 220 lb 2 oz (99.8 kg)   BMI 31.58 kg/m   General: Well-nourished, well-developed in no acute distress.  Eyes: No icterus. Conjunctivae pink. Mouth: Oropharyngeal mucosa moist and pink , no lesions erythema or exudate. Neck: Supple, Trachea midline Abdomen: Bowel sounds are normal, nontender, nondistended, no hepatosplenomegaly or masses, no abdominal bruits or hernia , no rebound or guarding.   Extremities: No lower extremity edema. No clubbing or deformities. Neuro: Alert and oriented x 3.  Grossly intact. Skin: Warm and dry, no jaundice.   Psych: Alert and cooperative, normal mood and affect.   Labs: CMP     Component Value Date/Time   NA 139 11/22/2018 0913   K 3.5 11/22/2018 0913   CL 108 11/22/2018 0913   CO2 21 (L) 11/22/2018 0913   GLUCOSE  127 (H) 11/22/2018 0913   BUN 8 11/22/2018 0913   CREATININE 1.00 11/22/2018 0913   CREATININE 0.99 10/29/2017 0959   CALCIUM 8.3 (L) 11/22/2018 0913   PROT 6.7 11/19/2018 2300   ALBUMIN 3.9 11/19/2018 2300   AST 41 11/19/2018 2300   ALT 33 11/19/2018 2300   ALKPHOS 52 11/19/2018 2300   BILITOT 1.0 11/19/2018 2300    GFRNONAA >60 11/22/2018 0913   GFRNONAA 72 10/29/2017 0959   GFRAA >60 11/22/2018 0913   GFRAA 83 10/29/2017 0959   Lab Results  Component Value Date   WBC 12.8 (H) 12/03/2018   HGB 10.7 (L) 12/03/2018   HCT 32.3 (L) 12/03/2018   MCV 97.6 12/03/2018   PLT 510 (H) 12/03/2018    Imaging Studies: Nm Gi Blood Loss  Result Date: 11/21/2018 CLINICAL DATA:  GI bleeding diarrhea EXAM: NUCLEAR MEDICINE GASTROINTESTINAL BLEEDING SCAN TECHNIQUE: Sequential abdominal images were obtained following intravenous administration of Tc-56m labeled red blood cells. RADIOPHARMACEUTICALS:  21.41 mCi Tc-56m pertechnetate in-vitro labeled red cells. COMPARISON:  CT 11/20/2018 FINDINGS: Normal blood pool activity. Focal activity in the region of the penis. Faint bladder activity noted. Activity present within the distal descending/proximal sigmoid colon with progression of activity through the sigmoid colon. Configuration of activity appears congruent with the anatomy of the colon on the comparison CT. IMPRESSION: Positive for active GI bleeding, suspected source is the distal descending/proximal sigmoid colon. Critical Value/emergent results were called by telephone at the time of interpretation on 11/21/2018 at 1:20 am to Dr. Sidney Ace, who verbally acknowledged these results. Electronically Signed   By: Donavan Foil M.D.   On: 11/21/2018 01:21   Ct Abdomen Pelvis W Contrast  Result Date: 11/20/2018 CLINICAL DATA:  84 year old male with GI bleed and diarrhea. EXAM: CT ABDOMEN AND PELVIS WITH CONTRAST TECHNIQUE: Multidetector CT imaging of the abdomen and pelvis was performed using the standard protocol following bolus administration of intravenous contrast. CONTRAST:  163mL OMNIPAQUE IOHEXOL 300 MG/ML  SOLN COMPARISON:  CT of the abdomen pelvis dated 12/29/2014 FINDINGS: Lower chest: The visualized lung bases are clear. There is coronary vascular calcification. No intra-abdominal free air or free fluid. Hepatobiliary:  The fused fatty infiltration of the liver. No intrahepatic biliary ductal dilatation. The gallbladder is unremarkable. Pancreas: Unremarkable. No pancreatic ductal dilatation or surrounding inflammatory changes. Spleen: Normal in size without focal abnormality. Adrenals/Urinary Tract: The adrenal glands are unremarkable. There is no hydronephrosis on either side. There is symmetric enhancement and excretion of contrast by both kidneys. Multiple bilateral renal cysts measure up to 4 cm in the volar aspect of the right. Additional smaller renal hypodensities are too small to characterize. The visualized ureters and urinary bladder appear unremarkable. Stomach/Bowel: There are small scattered sigmoid diverticula without active inflammatory changes. There is no bowel obstruction or active inflammation. The appendix is not visualized with certainty. No inflammatory changes identified in the right lower quadrant. Vascular/Lymphatic: Moderate aortoiliac atherosclerotic disease. There is a 2.6 cm infrarenal aortic ectasia. The IVC is unremarkable. No portal venous gas. There is no adenopathy. Reproductive: Prostate brachytherapy seeds. Other: None Musculoskeletal: Degenerative changes of the spine. No acute osseous pathology. IMPRESSION: 1. No acute intra-abdominal or pelvic pathology. No bowel obstruction or active inflammation. 2. Sigmoid diverticulosis. 3. Fatty liver. Aortic Atherosclerosis (ICD10-I70.0). Electronically Signed   By: Anner Crete M.D.   On: 11/20/2018 02:29   Dg Chest Port 1 View  Result Date: 11/19/2018 CLINICAL DATA:  GI bleed EXAM: PORTABLE CHEST 1 VIEW COMPARISON:  08/26/2016  FINDINGS: Prior CABG. Heart and mediastinal contours are within normal limits. No focal opacities or effusions. No acute bony abnormality. IMPRESSION: No active cardiopulmonary disease. Electronically Signed   By: Rolm Baptise M.D.   On: 11/19/2018 23:56    Assessment and Plan:   Phillip Nelson is a 84 y.o. y/o  male with hospitalization for hematochezia in Aug 2020 with positive RBC scan and resolution of symptoms s/p embolization  Hematochezia was likely due to diverticuli  Recent colonoscopy within the last 2 years reassuring  Hgb improved Importance of high fiber diet and maintaining soft stool discussed Pt trying to eat more fiber and has diet handout from PCP  I have asked him to take miralax daily or every other day with goal of soft BM daily without straining.  After 2-4 weeks if he has increased fiber in his diet he can try to decrease the miralax. If soft stools without straining continue he can continue to avoid miralax. If they do not, resume miralax.  If symptoms return, he was asked to notify us  CBC in 1 month to ensure anemia has completely resolved    Dr Vonda Antigua

## 2018-12-17 ENCOUNTER — Ambulatory Visit (INDEPENDENT_AMBULATORY_CARE_PROVIDER_SITE_OTHER): Payer: Medicare Other

## 2018-12-17 VITALS — Temp 97.5°F | Ht 70.0 in | Wt 220.0 lb

## 2018-12-17 DIAGNOSIS — Z0000 Encounter for general adult medical examination without abnormal findings: Secondary | ICD-10-CM | POA: Diagnosis not present

## 2018-12-17 NOTE — Progress Notes (Signed)
Subjective:   Phillip Nelson is a 84 y.o. male who presents for Medicare Annual/Subsequent preventive examination.  This visit is being conducted via phone call  - after an attmept to do on video chat - due to the COVID-19 pandemic. This patient has given me verbal consent via phone to conduct this visit, patient states they are participating from their home address. Some vital signs may be absent or patient reported.   Patient identification: identified by name, DOB, and current address.    Review of Systems:   Cardiac Risk Factors include: advanced age (>47men, >73 women);male gender     Objective:    Vitals: Temp (!) 97.5 F (36.4 C) (Oral)   Ht 5\' 10"  (1.778 m)   Wt 220 lb (99.8 kg) Comment: pt reported  BMI 31.57 kg/m   Body mass index is 31.57 kg/m.  Advanced Directives 12/17/2018 11/20/2018 11/19/2018 12/11/2017 06/25/2017 05/11/2017 10/31/2016  Does Patient Have a Medical Advance Directive? Yes No No Yes No No;Yes No  Type of Advance Directive Healthcare Power of [place] -  Does patient want to make changes to medical advance directive? - - - - - No - Patient declined -  Copy of [place] in Chart? No - copy requested - - No - copy requested - Yes -  Would patient like information on creating a medical advance directive? - No - Patient declined - - No - Patient declined No - Patient declined No - Patient declined  Pre-existing out of facility DNR order (yellow form or pink MOST form) - - - - - - -    Tobacco Social History   Tobacco Use  Smoking Status Former Smoker  . Packs/day: 3.00  . Years: 31.00  . Pack years: 93.00  . Quit date: 08/01/1987  . Years since quitting: 31.4  Smokeless Tobacco Never Used     Counseling given: Not Answered   Clinical Intake:  Pre-visit preparation completed: Yes  Pain : No/denies pain     Nutritional Status: BMI > 30  Obese Nutritional  Risks: None Diabetes: No  How often do you need to have someone help you when you read instructions, pamphlets, or other written materials from your doctor or pharmacy?: 1 - Never  Interpreter Needed?: No  Information entered by :: Wretha Laris,LPN  Past Medical History:  Diagnosis Date  . Angina at rest [facility])   . Aural vertigo   . Coronary artery disease [place]  . ED (erectile dysfunction)   . Gout, arthritis   . History of skin cancer   . Hyperlipidemia   . OSA (obstructive sleep apnea)    CPAP NON-COMPLIANT  . Prostate cancer ([place]) 05/08/12   Adenocarcinoma,gleason=3+3=6,PSA=8.26,vol=57cc   Past Surgical History:  Procedure Laterality Date  . APPENDECTOMY     Life was saved with emerg. surgery.   Marland Kitchen CARDIAC SURGERY    . CARDIOVASCULAR STRESS TEST  09-07-2011  dr Saralyn Pilar   normal perfusion/ ef 57%/ no ischemia  . CARPECTOMY Right    wrist  . CATARACT EXTRACTION W/ INTRAOCULAR LENS  IMPLANT, BILATERAL    . COLONOSCOPY WITH PROPOFOL N/A 10/31/2016   Procedure: COLONOSCOPY WITH PROPOFOL;  Surgeon: Christene Lye, MD;  Location: [facility] ENDOSCOPY;  Service: Endoscopy;  Laterality: N/A;  . CORONARY ARTERY BYPASS GRAFT  JULY 2005  ([facility])   LIMA TO THE LAD  . DORSAL COMPARTMENT RELEASE Left 06/25/2017  Procedure: LEFT WRIST TENDON SHEATH RELEASE;  Surgeon: Milly Jakob, MD;  Location: [place];  Service: Orthopedics;  Laterality: Left;  . EMBOLIZATION N/A 11/21/2018   Procedure: EMBOLIZATION ([place]);  Surgeon: Katha Cabal, MD;  Location: [place] CV LAB;  Service: Cardiovascular;  Laterality: N/A;  . FOOT SURGERY Bilateral    GOUT  . KNEE ARTHROSCOPY Bilateral   . LEFT ELBOW SURGERY  AGE 12  &  2007  . RADIOACTIVE SEED IMPLANT N/A 08/02/2012   Procedure: RADIOACTIVE SEED IMPLANT;  Surgeon: Claybon Jabs, MD;  [facility];  Service: Urology;  Laterality: N/A;  . TOTAL KNEE ARTHROPLASTY Bilateral LEFT 2012/    RIGHT 2006  . TRANSTHORACIC ECHOCARDIOGRAM  11-24-2010   normal lvf &  lvh/ mild mr, tr, and ai   Family History  Problem Relation Age of Onset  . Alzheimer's disease Father   . Cancer Sister 22       treated surgically   Social History   Socioeconomic History  . Marital status: Significant Other    Spouse name: Not on file  . Number of children: Not on file  . Years of education: Not on file  . Highest education level: Some college, no degree  Occupational History  . Occupation: Retired  Scientific laboratory technician  . Financial resource strain: Not hard at all  . Food insecurity    Worry: Never true    Inability: Never true  . Transportation needs    Medical: No    Non-medical: No  Tobacco Use  . Smoking status: Former Smoker    Packs/day: 3.00    Years: 31.00    Pack years: 93.00    Quit date: 08/01/1987    Years since quitting: 31.4  . Smokeless tobacco: Never Used  Substance and Sexual Activity  . Alcohol use: No    Comment: rarely a beer  . Drug use: No  . Sexual activity: Not on file  Lifestyle  . Physical activity    Days per week: 0 days    Minutes per session: 0 min  . Stress: Not at all  Relationships  . Social connections    Talks on phone: More than three times a week    Gets together: More than three times a week    Attends religious service: Never    Active member of club or organization: No    Attends meetings of clubs or organizations: Never    Relationship status: Living with partner  Other Topics Concern  . Not on file  Social History Narrative   golfs 1-2 days a week    Outpatient Encounter Medications as of 12/17/2018  Medication Sig  . allopurinol (ZYLOPRIM) 300 MG tablet TAKE ONE (1) TABLET BY MOUTH EVERY DAY AS DIRECTED (Patient taking differently: Take 300 mg by mouth daily. )  . ferrous sulfate 325 (65 FE) MG tablet Take 325 mg by mouth daily with breakfast.  . Potassium 99 MG TABS Take 396 mg by mouth daily.    No facility-administered  encounter medications on file as of 12/17/2018.     Activities of Daily Living In your present state of health, do you have any difficulty performing the following activities: 12/17/2018 11/20/2018  Hearing? N N  Comment no hearing aids -  Vision? N N  Comment eyeglasses, dr.porfilio annually -  Difficulty concentrating or making decisions? N N  Walking or climbing stairs? N N  Dressing or bathing? N N  Doing errands,  shopping? N N  Preparing Food and eating ? N -  Using the Toilet? N -  In the past six months, have you accidently leaked urine? N -  Do you have problems with loss of bowel control? N -  Managing your Medications? N -  Managing your Finances? N -  Housekeeping or managing your Housekeeping? N -  Some recent data might be hidden    Patient Care Team: Olin Hauser, DO as PCP - General (Family Medicine) Beverly Gust, MD (Otolaryngology) Ralene Bathe, MD (Dermatology)   Assessment:   This is a routine wellness examination for [place].  Exercise Activities and Dietary recommendations Current Exercise Habits: Home exercise routine, Type of exercise: walking, Time (Minutes): 30, Frequency (Times/Week): 5, Weekly Exercise (Minutes/Week): 150, Intensity: Mild, Exercise limited by: None identified  Goals    . DIET - INCREASE WATER INTAKE     Recommend drinking at least 6-8 glasses of water a day        Fall Risk: Fall Risk  12/17/2018 12/11/2017 10/29/2017 10/25/2017 07/10/2016  Falls in the past year? 0 No No No No  Comment - - - Emmi Telephone Survey: data to providers prior to load -    FALL RISK PREVENTION PERTAINING TO THE HOME:  Any stairs in or around the home? Yes  3 steps going inside  If so, are there any without handrails? No   Home free of loose throw rugs in walkways, pet beds, electrical cords, etc? Yes  Adequate lighting in your home to reduce risk of falls? Yes   ASSISTIVE DEVICES UTILIZED TO PREVENT FALLS:  Life alert? No  Use  of a cane, walker or w/c? No  Grab bars in the bathroom? No  Shower chair or bench in shower? No  Elevated toilet seat or a handicapped toilet? No   TIMED UP AND GO:  unable to perform   Depression Screen PHQ 2/9 Scores 12/17/2018 12/03/2018 12/11/2017 10/29/2017  PHQ - 2 Score 0 1 0 0  PHQ- 9 Score - 4 - -    Cognitive Function     6CIT Screen 12/11/2017  What Year? 0 points  What month? 0 points  What time? 0 points  Count back from 20 0 points  Months in reverse 0 points  Repeat phrase 0 points  Total Score 0    Immunization History  Administered Date(s) Administered  . Influenza, High Dose Seasonal PF 02/02/2015, 01/10/2017, 01/10/2018  . Influenza-Unspecified 01/02/2013, 01/08/2014, 02/02/2015  . Pneumococcal Conjugate-13 08/10/2010  . Pneumococcal Polysaccharide-23 08/10/2014  . Tdap 08/10/2014    Qualifies for Shingles Vaccine? Yes  Zostavax completed n/a. Due for Shingrix. Education has been provided regarding the importance of this vaccine. Pt has been advised to call insurance company to determine out of pocket expense. Advised may also receive vaccine at local pharmacy or Health Dept. Verbalized acceptance and understanding.  Tdap: up to date   Flu Vaccine: Due for Flu vaccine.   Pneumococcal Vaccine: up to date   Screening Tests Health Maintenance  Topic Date Due  . INFLUENZA VACCINE  10/26/2018  . TETANUS/TDAP  08/09/2024  . PNA vac Low Risk Adult  Completed   Cancer Screenings:  Colorectal Screening: no longer required   Lung Cancer Screening: (Low Dose CT Chest recommended if Age 30-80 years, 30 pack-year currently smoking OR have quit w/in 15years.) does not qualify.     Additional Screening:  Hepatitis C Screening: does not qualify  Vision Screening: Recommended annual ophthalmology  exams for early detection of glaucoma and other disorders of the eye. Is the patient up to date with their annual eye exam?  Yes  Who is the provider or what is  the name of the office in which the pt attends annual eye exams? Dr.porfilio   Dental Screening: Recommended annual dental exams for proper oral hygiene  Community Resource Referral:  CRR required this visit?  No        Plan:  I have personally reviewed and addressed the Medicare Annual Wellness questionnaire and have noted the following in the patient's chart:  A. Medical and social history B. Use of alcohol, tobacco or illicit drugs  C. Current medications and supplements D. Functional ability and status E.  Nutritional status F.  Physical activity G. Advance directives H. List of other physicians I.  Hospitalizations, surgeries, and ER visits in previous 12 months J.  [place] such as hearing and vision if needed, cognitive and depression L. Referrals and appointments    In addition, I have reviewed and discussed with patient certain preventive protocols, quality metrics, and best practice recommendations. A written personalized care plan for preventive services as well as general preventive health recommendations were provided to patient.   Signed,   Bevelyn Ngo, LPN  [id number] Nurse Health Advisor   Nurse Notes: none

## 2018-12-17 NOTE — Patient Instructions (Addendum)
Phillip Nelson , Thank you for taking time to come for your Medicare Wellness Visit. I appreciate your ongoing commitment to your health goals. Please review the following plan we discussed and let me know if I can assist you in the future.   Screening recommendations/referrals: Colonoscopy: no longer required Recommended yearly ophthalmology/optometry visit for glaucoma screening and checkup Recommended yearly dental visit for hygiene and checkup  Vaccinations: Influenza vaccine: due now Pneumococcal vaccine: up to date Tdap vaccine: up to date Shingles vaccine: shingrix eligible     Advanced directives: Please bring a copy of your health care power of attorney and living will to the office at your convenience.  Conditions/risks identified: please call if you need anything.   Next appointment: follow up in one year for your annual wellness visit.   Preventive Care 46 Years and Older, Male Preventive care refers to lifestyle choices and visits with your health care provider that can promote health and wellness. What does preventive care include?  A yearly physical exam. This is also called an annual well check.  Dental exams once or twice a year.  Routine eye exams. Ask your health care provider how often you should have your eyes checked.  Personal lifestyle choices, including:  Daily care of your teeth and gums.  Regular physical activity.  Eating a healthy diet.  Avoiding tobacco and drug use.  Limiting alcohol use.  Practicing safe sex.  Taking low doses of aspirin every day.  Taking vitamin and mineral supplements as recommended by your health care provider. What happens during an annual well check? The services and screenings done by your health care provider during your annual well check will depend on your age, overall health, lifestyle risk factors, and family history of disease. Counseling  Your health care provider may ask you questions about your:   Alcohol use.  Tobacco use.  Drug use.  Emotional well-being.  Home and relationship well-being.  Sexual activity.  Eating habits.  History of falls.  Memory and ability to understand (cognition).  Work and work Statistician. Screening  You may have the following tests or measurements:  Height, weight, and BMI.  Blood pressure.  Lipid and cholesterol levels. These may be checked every 5 years, or more frequently if you are over 38 years old.  Skin check.  Lung cancer screening. You may have this screening every year starting at age 36 if you have a 30-pack-year history of smoking and currently smoke or have quit within the past 15 years.  Fecal occult blood test (FOBT) of the stool. You may have this test every year starting at age 66.  Flexible sigmoidoscopy or colonoscopy. You may have a sigmoidoscopy every 5 years or a colonoscopy every 10 years starting at age 13.  Prostate cancer screening. Recommendations will vary depending on your family history and other risks.  Hepatitis C blood test.  Hepatitis B blood test.  Sexually transmitted disease (STD) testing.  Diabetes screening. This is done by checking your blood sugar (glucose) after you have not eaten for a while (fasting). You may have this done every 1-3 years.  Abdominal aortic aneurysm (AAA) screening. You may need this if you are a current or former smoker.  Osteoporosis. You may be screened starting at age 70 if you are at high risk. Talk with your health care provider about your test results, treatment options, and if necessary, the need for more tests. Vaccines  Your health care provider may recommend certain vaccines, such as:  Influenza vaccine. This is recommended every year.  Tetanus, diphtheria, and acellular pertussis (Tdap, Td) vaccine. You may need a Td booster every 10 years.  Zoster vaccine. You may need this after age 19.  Pneumococcal 13-valent conjugate (PCV13) vaccine. One dose is  recommended after age 32.  Pneumococcal polysaccharide (PPSV23) vaccine. One dose is recommended after age 51. Talk to your health care provider about which screenings and vaccines you need and how often you need them. This information is not intended to replace advice given to you by your health care provider. Make sure you discuss any questions you have with your health care provider. Document Released: 04/09/2015 Document Revised: 12/01/2015 Document Reviewed: 01/12/2015 Elsevier Interactive Patient Education  2017 [place] Prevention in the Home Falls can cause injuries. They can happen to people of all ages. There are many things you can do to make your home safe and to help prevent falls. What can I do on the outside of my home?  Regularly fix the edges of walkways and driveways and fix any cracks.  Remove anything that might make you trip as you walk through a door, such as a raised step or threshold.  Trim any bushes or trees on the path to your home.  Use bright outdoor lighting.  Clear any walking paths of anything that might make someone trip, such as rocks or tools.  Regularly check to see if handrails are loose or broken. Make sure that both sides of any steps have handrails.  Any raised decks and porches should have guardrails on the edges.  Have any leaves, snow, or ice cleared regularly.  Use sand or salt on walking paths during winter.  Clean up any spills in your garage right away. This includes oil or grease spills. What can I do in the bathroom?  Use night lights.  Install grab bars by the toilet and in the tub and shower. Do not use towel bars as grab bars.  Use non-skid mats or decals in the tub or shower.  If you need to sit down in the shower, use a plastic, non-slip stool.  Keep the floor dry. Clean up any water that spills on the floor as soon as it happens.  Remove soap buildup in the tub or shower regularly.  Attach bath mats  securely with double-sided non-slip rug tape.  Do not have throw rugs and other things on the floor that can make you trip. What can I do in the bedroom?  Use night lights.  Make sure that you have a light by your bed that is easy to reach.  Do not use any sheets or blankets that are too big for your bed. They should not hang down onto the floor.  Have a firm chair that has side arms. You can use this for support while you get dressed.  Do not have throw rugs and other things on the floor that can make you trip. What can I do in the kitchen?  Clean up any spills right away.  Avoid walking on wet floors.  Keep items that you use a lot in easy-to-reach places.  If you need to reach something above you, use a strong step stool that has a grab bar.  Keep electrical cords out of the way.  Do not use floor polish or wax that makes floors slippery. If you must use wax, use non-skid floor wax.  Do not have throw rugs and other things on the floor that  can make you trip. What can I do with my stairs?  Do not leave any items on the stairs.  Make sure that there are handrails on both sides of the stairs and use them. Fix handrails that are broken or loose. Make sure that handrails are as long as the stairways.  Check any carpeting to make sure that it is firmly attached to the stairs. Fix any carpet that is loose or worn.  Avoid having throw rugs at the top or bottom of the stairs. If you do have throw rugs, attach them to the floor with carpet tape.  Make sure that you have a light switch at the top of the stairs and the bottom of the stairs. If you do not have them, ask someone to add them for you. What else can I do to help prevent falls?  Wear shoes that:  Do not have high heels.  Have rubber bottoms.  Are comfortable and fit you well.  Are closed at the toe. Do not wear sandals.  If you use a stepladder:  Make sure that it is fully opened. Do not climb a closed  stepladder.  Make sure that both sides of the stepladder are locked into place.  Ask someone to hold it for you, if possible.  Clearly mark and make sure that you can see:  Any grab bars or handrails.  First and last steps.  Where the edge of each step is.  Use tools that help you move around (mobility aids) if they are needed. These include:  Canes.  Walkers.  Scooters.  Crutches.  Turn on the lights when you go into a dark area. Replace any light bulbs as soon as they burn out.  Set up your furniture so you have a clear path. Avoid moving your furniture around.  If any of your floors are uneven, fix them.  If there are any pets around you, be aware of where they are.  Review your medicines with your doctor. Some medicines can make you feel dizzy. This can increase your chance of falling. Ask your doctor what other things that you can do to help prevent falls. This information is not intended to replace advice given to you by your health care provider. Make sure you discuss any questions you have with your health care provider. Document Released: 01/07/2009 Document Revised: 08/19/2015 Document Reviewed: 04/17/2014 Elsevier Interactive Patient Education  2017 [employer].

## 2018-12-24 ENCOUNTER — Other Ambulatory Visit: Payer: Self-pay

## 2018-12-24 ENCOUNTER — Encounter: Payer: Self-pay | Admitting: *Deleted

## 2018-12-24 ENCOUNTER — Ambulatory Visit (INDEPENDENT_AMBULATORY_CARE_PROVIDER_SITE_OTHER): Payer: Medicare Other

## 2018-12-24 DIAGNOSIS — Z23 Encounter for immunization: Secondary | ICD-10-CM

## 2019-01-13 ENCOUNTER — Ambulatory Visit: Payer: Medicare Other | Admitting: Gastroenterology

## 2019-02-11 ENCOUNTER — Ambulatory Visit: Payer: Medicare Other | Admitting: Gastroenterology

## 2019-03-06 DIAGNOSIS — R519 Headache, unspecified: Secondary | ICD-10-CM | POA: Diagnosis not present

## 2019-03-06 DIAGNOSIS — R29810 Facial weakness: Secondary | ICD-10-CM | POA: Diagnosis not present

## 2019-03-10 DIAGNOSIS — L304 Erythema intertrigo: Secondary | ICD-10-CM | POA: Diagnosis not present

## 2019-03-10 DIAGNOSIS — D1801 Hemangioma of skin and subcutaneous tissue: Secondary | ICD-10-CM | POA: Diagnosis not present

## 2019-03-10 DIAGNOSIS — Z85828 Personal history of other malignant neoplasm of skin: Secondary | ICD-10-CM | POA: Diagnosis not present

## 2019-03-10 DIAGNOSIS — L821 Other seborrheic keratosis: Secondary | ICD-10-CM | POA: Diagnosis not present

## 2019-03-10 DIAGNOSIS — L570 Actinic keratosis: Secondary | ICD-10-CM | POA: Diagnosis not present

## 2019-03-10 DIAGNOSIS — D225 Melanocytic nevi of trunk: Secondary | ICD-10-CM | POA: Diagnosis not present

## 2019-03-10 DIAGNOSIS — L814 Other melanin hyperpigmentation: Secondary | ICD-10-CM | POA: Diagnosis not present

## 2019-03-10 DIAGNOSIS — L820 Inflamed seborrheic keratosis: Secondary | ICD-10-CM | POA: Diagnosis not present

## 2019-03-10 DIAGNOSIS — D2230 Melanocytic nevi of unspecified part of face: Secondary | ICD-10-CM | POA: Diagnosis not present

## 2019-03-10 DIAGNOSIS — D229 Melanocytic nevi, unspecified: Secondary | ICD-10-CM | POA: Diagnosis not present

## 2019-03-10 DIAGNOSIS — Z1283 Encounter for screening for malignant neoplasm of skin: Secondary | ICD-10-CM | POA: Diagnosis not present

## 2019-03-10 DIAGNOSIS — L578 Other skin changes due to chronic exposure to nonionizing radiation: Secondary | ICD-10-CM | POA: Diagnosis not present

## 2019-04-21 DIAGNOSIS — L820 Inflamed seborrheic keratosis: Secondary | ICD-10-CM | POA: Diagnosis not present

## 2019-04-21 DIAGNOSIS — Z872 Personal history of diseases of the skin and subcutaneous tissue: Secondary | ICD-10-CM | POA: Diagnosis not present

## 2019-04-21 DIAGNOSIS — L570 Actinic keratosis: Secondary | ICD-10-CM | POA: Diagnosis not present

## 2019-06-02 DIAGNOSIS — Z85828 Personal history of other malignant neoplasm of skin: Secondary | ICD-10-CM | POA: Diagnosis not present

## 2019-06-02 DIAGNOSIS — L820 Inflamed seborrheic keratosis: Secondary | ICD-10-CM | POA: Diagnosis not present

## 2019-06-02 DIAGNOSIS — L821 Other seborrheic keratosis: Secondary | ICD-10-CM | POA: Diagnosis not present

## 2019-06-02 DIAGNOSIS — L570 Actinic keratosis: Secondary | ICD-10-CM | POA: Diagnosis not present

## 2019-06-02 DIAGNOSIS — L578 Other skin changes due to chronic exposure to nonionizing radiation: Secondary | ICD-10-CM | POA: Diagnosis not present

## 2019-07-07 DIAGNOSIS — M10342 Gout due to renal impairment, left hand: Secondary | ICD-10-CM | POA: Diagnosis not present

## 2019-07-07 DIAGNOSIS — G5602 Carpal tunnel syndrome, left upper limb: Secondary | ICD-10-CM | POA: Diagnosis not present

## 2019-07-16 DIAGNOSIS — G5602 Carpal tunnel syndrome, left upper limb: Secondary | ICD-10-CM | POA: Diagnosis not present

## 2019-07-24 DIAGNOSIS — M10342 Gout due to renal impairment, left hand: Secondary | ICD-10-CM | POA: Diagnosis not present

## 2019-07-24 DIAGNOSIS — G5602 Carpal tunnel syndrome, left upper limb: Secondary | ICD-10-CM | POA: Diagnosis not present

## 2019-08-01 DIAGNOSIS — G5602 Carpal tunnel syndrome, left upper limb: Secondary | ICD-10-CM | POA: Diagnosis not present

## 2019-08-19 DIAGNOSIS — G5602 Carpal tunnel syndrome, left upper limb: Secondary | ICD-10-CM | POA: Diagnosis not present

## 2019-09-22 ENCOUNTER — Other Ambulatory Visit: Payer: Self-pay

## 2019-09-22 ENCOUNTER — Ambulatory Visit (INDEPENDENT_AMBULATORY_CARE_PROVIDER_SITE_OTHER): Payer: Medicare Other | Admitting: Family Medicine

## 2019-09-22 ENCOUNTER — Other Ambulatory Visit: Payer: Self-pay | Admitting: Family Medicine

## 2019-09-22 ENCOUNTER — Encounter: Payer: Self-pay | Admitting: Family Medicine

## 2019-09-22 VITALS — BP 110/70 | HR 96 | Temp 97.8°F | Ht 69.75 in | Wt 224.2 lb

## 2019-09-22 DIAGNOSIS — R519 Headache, unspecified: Secondary | ICD-10-CM

## 2019-09-22 DIAGNOSIS — M1A9XX0 Chronic gout, unspecified, without tophus (tophi): Secondary | ICD-10-CM

## 2019-09-22 DIAGNOSIS — G4452 New daily persistent headache (NDPH): Secondary | ICD-10-CM

## 2019-09-22 DIAGNOSIS — E782 Mixed hyperlipidemia: Secondary | ICD-10-CM

## 2019-09-22 DIAGNOSIS — D649 Anemia, unspecified: Secondary | ICD-10-CM | POA: Insufficient documentation

## 2019-09-22 DIAGNOSIS — M109 Gout, unspecified: Secondary | ICD-10-CM

## 2019-09-22 DIAGNOSIS — D72829 Elevated white blood cell count, unspecified: Secondary | ICD-10-CM

## 2019-09-22 DIAGNOSIS — Z8546 Personal history of malignant neoplasm of prostate: Secondary | ICD-10-CM

## 2019-09-22 DIAGNOSIS — R7303 Prediabetes: Secondary | ICD-10-CM

## 2019-09-22 DIAGNOSIS — I2510 Atherosclerotic heart disease of native coronary artery without angina pectoris: Secondary | ICD-10-CM | POA: Diagnosis not present

## 2019-09-22 DIAGNOSIS — M791 Myalgia, unspecified site: Secondary | ICD-10-CM

## 2019-09-22 DIAGNOSIS — T466X5A Adverse effect of antihyperlipidemic and antiarteriosclerotic drugs, initial encounter: Secondary | ICD-10-CM

## 2019-09-22 HISTORY — DX: Headache, unspecified: R51.9

## 2019-09-22 LAB — COMPREHENSIVE METABOLIC PANEL
ALT: 32 U/L (ref 0–53)
AST: 33 U/L (ref 0–37)
Albumin: 4.4 g/dL (ref 3.5–5.2)
Alkaline Phosphatase: 72 U/L (ref 39–117)
BUN: 14 mg/dL (ref 6–23)
CO2: 25 mEq/L (ref 19–32)
Calcium: 9.4 mg/dL (ref 8.4–10.5)
Chloride: 105 mEq/L (ref 96–112)
Creatinine, Ser: 1.05 mg/dL (ref 0.40–1.50)
GFR: 67.52 mL/min (ref >60.00)
Glucose, Bld: 95 mg/dL (ref 70–99)
Potassium: 4.3 mEq/L (ref 3.5–5.1)
Sodium: 140 mEq/L (ref 135–145)
Total Bilirubin: 1.2 mg/dL (ref 0.2–1.2)
Total Protein: 7.4 g/dL (ref 6.0–8.3)

## 2019-09-22 LAB — CBC WITH DIFFERENTIAL/PLATELET
Basophils Absolute: 0 10*3/uL (ref 0.0–0.1)
Basophils Relative: 0.3 % (ref 0.0–3.0)
Eosinophils Absolute: 0.3 10*3/uL (ref 0.0–0.7)
Eosinophils Relative: 2.4 % (ref 0.0–5.0)
HCT: 45.6 % (ref 39.0–52.0)
Hemoglobin: 15.2 g/dL (ref 13.0–17.0)
Lymphocytes Relative: 18.2 % (ref 12.0–46.0)
Lymphs Abs: 2.3 10*3/uL (ref 0.7–4.0)
MCHC: 33.2 g/dL (ref 30.0–36.0)
MCV: 100.9 fl — ABNORMAL HIGH (ref 78.0–100.0)
Monocytes Absolute: 1.1 10*3/uL — ABNORMAL HIGH (ref 0.1–1.0)
Monocytes Relative: 8.7 % (ref 3.0–12.0)
Neutro Abs: 8.7 10*3/uL — ABNORMAL HIGH (ref 1.4–7.7)
Neutrophils Relative %: 70.4 % (ref 43.0–77.0)
Platelets: 274 10*3/uL (ref 150.0–400.0)
RBC: 4.52 Mil/uL (ref 4.22–5.81)
RDW: 13.2 % (ref 11.5–15.5)
WBC: 12.4 10*3/uL — ABNORMAL HIGH (ref 4.0–10.5)

## 2019-09-22 LAB — LIPID PANEL
Cholesterol: 164 mg/dL (ref 0–200)
HDL: 52.7 mg/dL (ref >39.00)
LDL Cholesterol: 88 mg/dL (ref 0–99)
NonHDL: 111.5
Total CHOL/HDL Ratio: 3
Triglycerides: 119 mg/dL (ref 0.0–149.0)
VLDL: 23.8 mg/dL (ref 0.0–40.0)

## 2019-09-22 LAB — HEMOGLOBIN A1C: Hgb A1c MFr Bld: 5.8 % (ref 4.6–6.5)

## 2019-09-22 MED ORDER — ALLOPURINOL 300 MG PO TABS: 300.0000 mg | ORAL_TABLET | Freq: Every day | ORAL | 3 refills | Status: DC

## 2019-09-22 MED ORDER — PRAVASTATIN SODIUM 20 MG PO TABS: 20.0000 mg | ORAL_TABLET | Freq: Every day | ORAL | 3 refills | Status: DC

## 2019-09-22 NOTE — Progress Notes (Signed)
Subjective:     Phillip Nelson is a 85 y.o. male presenting for Establish Care, Edema (right hand x 6 days ), and Headache (requesting a cat scan (hit his head in December))     HPI  #Edema - swelling in the right hand - not painful or throbbing  - symptoms started 1 week ago - sleeps on that side - pain w/ movement and putting it in his pockets - does not recall getting bit by anything  #Headache - frontal HA - severe pain which is the front of head - comes every few days - no nausea/vomiting - getting HA every few days - treatment: aleve - improved with aleve treatment - fall in December - did not lose consciousness, could not get up immediately - did not get immediate medical attention, did have "knot" on his head - endorses having headaches since this started - denies weakness   #gout - no flare in a long time   #Statin myalgia - tried 3 different medications - had muscle aches with all the medicines -   Review of Systems   Social History   Tobacco Use  Smoking Status Former Smoker  . Packs/day: 3.00  . Years: 31.00  . Pack years: 93.00  . Quit date: 08/01/1987  . Years since quitting: 32.1  Smokeless Tobacco Never Used        Objective:    BP Readings from Last 3 Encounters:  09/22/19 110/70  12/10/18 115/75  12/03/18 121/72   Wt Readings from Last 3 Encounters:  09/22/19 224 lb 4 oz (101.7 kg)  12/17/18 220 lb (99.8 kg)  12/10/18 220 lb 2 oz (99.8 kg)    BP 110/70   Pulse 96   Temp 97.8 F (36.6 C) (Temporal)   Ht 5' 9.75" (1.772 m)   Wt 224 lb 4 oz (101.7 kg)   SpO2 95%   BMI 32.41 kg/m    Physical Exam Constitutional:      Appearance: Normal appearance. He is not ill-appearing or diaphoretic.  HENT:     Right Ear: External ear normal.     Left Ear: External ear normal.     Nose: Nose normal.  Eyes:     General: No scleral icterus.    Extraocular Movements: Extraocular movements intact.     Conjunctiva/sclera:  Conjunctivae normal.  Cardiovascular:     Rate and Rhythm: Normal rate.  Pulmonary:     Effort: Pulmonary effort is normal.  Musculoskeletal:     Cervical back: Neck supple.     Comments: Right Wrist:  Inspection: hand swelling, mass on dorsal surface, mild erythema Palpation: soft mobile mass, non-fluctuant, mild TTP ROM: pain with wrist extension   Skin:    General: Skin is warm and dry.  Neurological:     Mental Status: He is alert. Mental status is at baseline.  Psychiatric:        Mood and Affect: Mood normal.           Assessment & Plan:   Problem List Items Addressed This Visit      Cardiovascular and Mediastinum   CAD (coronary artery disease)    Reviewed that given prior CABG would recommend statin. He is willing to try again. If symptoms will consider Vit D supplementation or every other day dosing. Labs today      Relevant Medications   aspirin EC 81 MG tablet   pravastatin (PRAVACHOL) 20 MG tablet   Other Relevant Orders  Comprehensive metabolic panel     Other   History of prostate cancer    Lab Results  Component Value Date   PSA <0.1 12/03/2018  annual PSA for monitoring      Hyperlipemia, mixed   Relevant Medications   aspirin EC 81 MG tablet   pravastatin (PRAVACHOL) 20 MG tablet   Other Relevant Orders   Comprehensive metabolic panel   Lipid panel   Myalgia due to statin   Prediabetes   Relevant Orders   Hemoglobin A1c   Gout - Primary    Acute gout flare suspected in wrist. Allergy to indomethacin. Will do Aleve and refill allopurinol. Uric acid once resolved to assess allopurinol dose given flare. Return to see Dr. Lorelei Pont (sports) if not improved.       New daily persistent headache    Pt with new HA pattern that started after a fall. Given his age and new HA will get head imaging. Unable to do neuro exam today due to time, however, did get a neurology eval 02/2019 after fall and reviewed their note with normal neuro exam.        Relevant Medications   aspirin EC 81 MG tablet   allopurinol (ZYLOPRIM) 300 MG tablet   Other Relevant Orders   CT HEAD W & WO CONTRAST   Anemia    Pt with hx of GI bleed and hospitalization. Will assess today. Limit NSAIDs for gout/headache as much as possible.       Relevant Orders   CBC with Differential       Return in about 4 weeks (around 10/20/2019) for to discuss Headaches after imaging is done.  [name], MD  This visit occurred during the SARS-CoV-2 public health emergency.  Safety protocols were in place, including screening questions prior to the visit, additional usage of staff PPE, and extensive cleaning of exam room while observing appropriate contact time as indicated for disinfecting solutions.

## 2019-09-22 NOTE — Assessment & Plan Note (Signed)
Acute gout flare suspected in wrist. Allergy to indomethacin. Will do Aleve and refill allopurinol. Uric acid once resolved to assess allopurinol dose given flare. Return to see Dr. Lorelei Pont (sports) if not improved.

## 2019-09-22 NOTE — Patient Instructions (Addendum)
#  Headache - ordered a CT scan - you should get a phone call about scheduling  #Gout flare - start taking Aleve twice daily  - go to once daily once swelling is improving and continue for 1 week  Labs today  #Cholesterol - start Pravastatin

## 2019-09-22 NOTE — Assessment & Plan Note (Signed)
Pt with hx of GI bleed and hospitalization. Will assess today. Limit NSAIDs for gout/headache as much as possible.

## 2019-09-22 NOTE — Assessment & Plan Note (Signed)
Reviewed that given prior CABG would recommend statin. He is willing to try again. If symptoms will consider Vit D supplementation or every other day dosing. Labs today

## 2019-09-22 NOTE — Assessment & Plan Note (Signed)
Lab Results  Component Value Date   PSA <0.1 12/03/2018  annual PSA for monitoring

## 2019-09-22 NOTE — Assessment & Plan Note (Signed)
Pt with new HA pattern that started after a fall. Given his age and new HA will get head imaging. Unable to do neuro exam today due to time, however, did get a neurology eval 02/2019 after fall and reviewed their note with normal neuro exam.

## 2019-09-23 ENCOUNTER — Telehealth: Payer: Self-pay

## 2019-09-23 NOTE — Telephone Encounter (Signed)
As we discussed in his office visit, we will check his uric acid once he no longer has an acute gout flare.   In the middle of a gout flare the uric acid level is never what is typical and collection is not always recommended especially in someone with known gout.   We will check his uric acid at follow-up when his wrist pain is improved

## 2019-09-23 NOTE — Telephone Encounter (Signed)
Agree with f/u if not improved.

## 2019-09-23 NOTE — Telephone Encounter (Signed)
Pt left v/m requesting cb about lab results. 

## 2019-09-23 NOTE — Telephone Encounter (Signed)
Spoke to pt again, and explained the plan, per Dr. Einar Pheasant. His only concern is if his swollen hand is not gout and maybe he was bit by something. I encouraged him to continue treatment discussed with Dr. Einar Pheasant during his visit yesterday and let us know if it's not better in a few days.

## 2019-09-23 NOTE — Telephone Encounter (Signed)
Called pt back about lab results and he inquired what his uric acid was. I told him his uric acid wasn't tested and he was confused why it wasn't if Dr. Einar Pheasant thought he has gout in his hand. Please advise.

## 2019-10-02 ENCOUNTER — Inpatient Hospital Stay: Payer: Medicare Other

## 2019-10-02 ENCOUNTER — Inpatient Hospital Stay: Payer: Medicare Other | Admitting: Oncology

## 2019-10-03 ENCOUNTER — Ambulatory Visit
Admission: RE | Admit: 2019-10-03 | Discharge: 2019-10-03 | Disposition: A | Discharge status: Home / Self Care | Payer: Medicare Other | Source: Ambulatory Visit | Attending: Family Medicine | Admitting: Family Medicine

## 2019-10-03 ENCOUNTER — Other Ambulatory Visit: Payer: Self-pay

## 2019-10-03 DIAGNOSIS — G4452 New daily persistent headache (NDPH): Secondary | ICD-10-CM | POA: Diagnosis present

## 2019-10-03 MED ORDER — IOHEXOL 300 MG/ML  SOLN
75.0000 mL | Freq: Once | INTRAMUSCULAR | Status: AC | PRN
  Administered 2019-10-03: 75 mL via INTRAVENOUS

## 2019-10-07 ENCOUNTER — Inpatient Hospital Stay: Payer: Medicare Other | Admitting: Oncology

## 2019-10-07 ENCOUNTER — Inpatient Hospital Stay: Payer: Medicare Other

## 2019-10-07 ENCOUNTER — Telehealth: Payer: Self-pay | Admitting: Family Medicine

## 2019-10-07 NOTE — Telephone Encounter (Signed)
I spoke with Phillip Nelson and informed him of the normal CT results. He was happy about that. He then went on to say that someone called and told him he had cancer in his blood and he wanted to know why a dr didn't tell him that. I told the Phillip Nelson that Hematology called him to make an appt to check his WBC, not that he had cancer in his blood. I asked him when was he suppose to see hematology and he stated that the appt was for today but he canceled it. I told him that it was important for him to see hematology and to please call them and reschedule. Phillip Nelson assured me that he would.

## 2019-10-07 NOTE — Telephone Encounter (Signed)
Patient called in wanting to go over CT scan results with CMA. Please advise. Call back is [phone].

## 2019-10-09 ENCOUNTER — Ambulatory Visit: Payer: Medicare Other | Admitting: Dermatology

## 2019-11-11 ENCOUNTER — Encounter: Payer: Self-pay | Admitting: Oncology

## 2019-11-11 ENCOUNTER — Encounter (INDEPENDENT_AMBULATORY_CARE_PROVIDER_SITE_OTHER): Payer: Self-pay

## 2019-11-11 ENCOUNTER — Other Ambulatory Visit: Payer: Self-pay

## 2019-11-11 ENCOUNTER — Inpatient Hospital Stay: Payer: Medicare Other

## 2019-11-11 ENCOUNTER — Inpatient Hospital Stay: Payer: Medicare Other | Attending: Oncology | Admitting: Oncology

## 2019-11-11 VITALS — BP 126/65 | HR 86 | Temp 97.8°F | Resp 20 | Wt 226.4 lb

## 2019-11-11 DIAGNOSIS — R51 Headache with orthostatic component, not elsewhere classified: Secondary | ICD-10-CM | POA: Insufficient documentation

## 2019-11-11 DIAGNOSIS — Z87891 Personal history of nicotine dependence: Secondary | ICD-10-CM | POA: Insufficient documentation

## 2019-11-11 DIAGNOSIS — I2510 Atherosclerotic heart disease of native coronary artery without angina pectoris: Secondary | ICD-10-CM | POA: Insufficient documentation

## 2019-11-11 DIAGNOSIS — Z7982 Long term (current) use of aspirin: Secondary | ICD-10-CM | POA: Diagnosis not present

## 2019-11-11 DIAGNOSIS — G4730 Sleep apnea, unspecified: Secondary | ICD-10-CM | POA: Insufficient documentation

## 2019-11-11 DIAGNOSIS — D72829 Elevated white blood cell count, unspecified: Secondary | ICD-10-CM | POA: Insufficient documentation

## 2019-11-11 DIAGNOSIS — Z79899 Other long term (current) drug therapy: Secondary | ICD-10-CM | POA: Insufficient documentation

## 2019-11-11 DIAGNOSIS — Z86010 Personal history of colonic polyps: Secondary | ICD-10-CM | POA: Insufficient documentation

## 2019-11-11 DIAGNOSIS — E785 Hyperlipidemia, unspecified: Secondary | ICD-10-CM | POA: Insufficient documentation

## 2019-11-11 DIAGNOSIS — Z8546 Personal history of malignant neoplasm of prostate: Secondary | ICD-10-CM | POA: Insufficient documentation

## 2019-11-11 DIAGNOSIS — Z85828 Personal history of other malignant neoplasm of skin: Secondary | ICD-10-CM | POA: Insufficient documentation

## 2019-11-11 LAB — HIV ANTIBODY (ROUTINE TESTING W REFLEX): HIV Screen 4th Generation wRfx: NONREACTIVE

## 2019-11-11 LAB — CBC WITH DIFFERENTIAL/PLATELET
Abs Immature Granulocytes: 0.05 10*3/uL (ref 0.00–0.07)
Basophils Absolute: 0.1 10*3/uL (ref 0.0–0.1)
Basophils Relative: 1 %
Eosinophils Absolute: 0.4 10*3/uL (ref 0.0–0.5)
Eosinophils Relative: 4 %
HCT: 44.3 % (ref 39.0–52.0)
Hemoglobin: 15.4 g/dL (ref 13.0–17.0)
Immature Granulocytes: 1 %
Lymphocytes Relative: 27 %
Lymphs Abs: 2.6 10*3/uL (ref 0.7–4.0)
MCH: 33.7 pg (ref 26.0–34.0)
MCHC: 34.8 g/dL (ref 30.0–36.0)
MCV: 96.9 fL (ref 80.0–100.0)
Monocytes Absolute: 0.8 10*3/uL (ref 0.1–1.0)
Monocytes Relative: 8 %
Neutro Abs: 5.5 10*3/uL (ref 1.7–7.7)
Neutrophils Relative %: 59 %
Platelets: 254 10*3/uL (ref 150–400)
RBC: 4.57 MIL/uL (ref 4.22–5.81)
RDW: 12.6 % (ref 11.5–15.5)
WBC: 9.4 10*3/uL (ref 4.0–10.5)
nRBC: 0 % (ref 0.0–0.2)

## 2019-11-11 LAB — COMPREHENSIVE METABOLIC PANEL
ALT: 42 U/L (ref 0–44)
AST: 44 U/L — ABNORMAL HIGH (ref 15–41)
Albumin: 4.2 g/dL (ref 3.5–5.0)
Alkaline Phosphatase: 64 U/L (ref 38–126)
Anion gap: 9 (ref 5–15)
BUN: 14 mg/dL (ref 8–23)
CO2: 24 mmol/L (ref 22–32)
Calcium: 8.9 mg/dL (ref 8.9–10.3)
Chloride: 106 mmol/L (ref 98–111)
Creatinine, Ser: 1.05 mg/dL (ref 0.61–1.24)
GFR calc Af Amer: >60 mL/min (ref >60)
GFR calc non Af Amer: >60 mL/min (ref >60)
Glucose, Bld: 92 mg/dL (ref 70–99)
Potassium: 4.1 mmol/L (ref 3.5–5.1)
Sodium: 139 mmol/L (ref 135–145)
Total Bilirubin: 1.6 mg/dL — ABNORMAL HIGH (ref 0.3–1.2)
Total Protein: 7.6 g/dL (ref 6.5–8.1)

## 2019-11-11 LAB — TECHNOLOGIST SMEAR REVIEW: Plt Morphology: ADEQUATE

## 2019-11-11 LAB — HEPATITIS PANEL, ACUTE
HCV Ab: NONREACTIVE
Hep A IgM: NONREACTIVE
Hep B C IgM: NONREACTIVE
Hepatitis B Surface Ag: NONREACTIVE

## 2019-11-11 LAB — LACTATE DEHYDROGENASE: LDH: 137 U/L (ref 98–192)

## 2019-11-11 LAB — BILIRUBIN, DIRECT: Bilirubin, Direct: 0.2 mg/dL (ref 0.0–0.2)

## 2019-11-11 NOTE — Progress Notes (Signed)
Hematology/Oncology Consult note [facility] Telephone:([phone] [phone]   Patient Care Team: [name], MD as PCP - General (Family Medicine) Beverly Gust, MD (Otolaryngology) Ralene Bathe, MD (Dermatology) Corey Skains, MD as Consulting Physician (Cardiology) Birder Robson, MD (Ophthalmology)  REFERRING PROVIDER: [name], MD  CHIEF COMPLAINTS/REASON FOR VISIT:  Evaluation of leukocytosis  HISTORY OF PRESENTING ILLNESS:  Phillip Nelson is a  85 y.o.  male with PMH listed below who was referred to me for evaluation of leukocytosis Reviewed patient' recent labs obtained by PCP.   09/22/2019 CBC showed elevated white count of 12.4, predominantly neutrophilia Previous lab records reviewed. Leukocytosis onset of chronic, duration is since at least 2014.  No aggravating or elevated factors. Associated symptoms or signs:  Denies weight loss, fever, chills,night sweats.  Fatigue Smoking history: Former smoker, he quitted in 1989 History of recent oral steroid use or steroid injection: Denies History of recent infection: Denies Autoimmune disease history.  Denies  Patient has a history of prostate cancer, diagnosed in 2014, Gleason score 3+3, PSA was 8 at the time of diagnosis.  Per patient, he has had reactive seed implant in 2014.Marland Kitchen  He follows up with dermatologist for squamous cell carcinoma and precancer lesions. He denies any chronic wound or infection.  Denies any constitutional symptoms. Reports chronic cough due to postnasal drip, productive for clear phlegm.  Review of Systems  Constitutional: Negative for appetite change, chills, fatigue, fever and unexpected weight change.  HENT:   Negative for hearing loss and voice change.        Sinus drainage  Eyes: Negative for eye problems and icterus.  Respiratory: Negative for chest tightness, cough and shortness of breath.   Cardiovascular: Negative for chest  pain and leg swelling.  Gastrointestinal: Negative for abdominal distention and abdominal pain.  Endocrine: Negative for hot flashes.  Genitourinary: Negative for difficulty urinating, dysuria and frequency.   Musculoskeletal: Negative for arthralgias.  Skin: Negative for itching and rash.  Neurological: Negative for light-headedness and numbness.  Hematological: Negative for adenopathy. Does not bruise/bleed easily.  Psychiatric/Behavioral: Negative for confusion.     MEDICAL HISTORY:  Past Medical History:  Diagnosis Date  . Angina at [facility])   . Aural vertigo   . Basal cell carcinoma    L mid infraclavicular, R post shoulder  . Colon polyps   . Coronary artery disease [place]  . Diverticulitis   . ED (erectile dysfunction)   . Frequent headaches   . Gout, arthritis   . History of skin cancer   . Hyperlipidemia   . OSA (obstructive sleep apnea)    CPAP NON-COMPLIANT  . Prostate cancer ([place]) 05/08/12   Adenocarcinoma,gleason=3+3=6,PSA=8.26,vol=57cc  . Squamous cell carcinoma, arm, right    R mid volar forearm    SURGICAL HISTORY: Past Surgical History:  Procedure Laterality Date  . APPENDECTOMY     Life was saved with emerg. surgery.   Marland Kitchen CARDIAC SURGERY    . CARDIOVASCULAR STRESS TEST  09-07-2011  dr Saralyn Pilar   normal perfusion/ ef 57%/ no ischemia  . CARPECTOMY Right    wrist  . CATARACT EXTRACTION W/ INTRAOCULAR LENS  IMPLANT, BILATERAL    . COLONOSCOPY WITH PROPOFOL N/A 10/31/2016   Procedure: COLONOSCOPY WITH PROPOFOL;  Surgeon: Christene Lye, MD;  Location: [facility] END[facility];  Service: Endoscopy;  Laterality: N/A;  . CORONARY ARTERY BYPASS GRAFT  JULY 2005  ([place])   LIMA TO THE LAD  .  DORSAL COMPARTMENT RELEASE Left 06/25/2017   Procedure: LEFT WRIST TENDON SHEATH RELEASE;  Surgeon: Milly Jakob, MD;  Location: [facility];  Service: Orthopedics;  Laterality: Left;  . EMBOLIZATION N/A 11/21/2018   Procedure: EMBOLIZATION  ([place]);  Surgeon: Katha Cabal, MD;  Location: [facility];  Service: Cardiovascular;  Laterality: N/A;  . FOOT SURGERY Bilateral    GOUT  . KNEE ARTHROSCOPY Bilateral   . LEFT ELBOW SURGERY  AGE 74  &  2007  . RADIOACTIVE SEED IMPLANT N/A 08/02/2012   Procedure: RADIOACTIVE SEED IMPLANT;  Surgeon: Claybon Jabs, MD;  Location: [facility];  Service: Urology;  Laterality: N/A;  . TOTAL KNEE ARTHROPLASTY Bilateral LEFT 2012/   RIGHT 2006  . TRANSTHORACIC ECHOCARDIOGRAM  11-24-2010   normal lvf &  lvh/ mild mr, tr, and ai    SOCIAL HISTORY: Social History   Socioeconomic History  . Marital status: Soil scientist    Spouse name: Blanch Media  . Number of children: 1  . Years of education: Not on file  . Highest education level: Some college, no degree  Occupational History  . Occupation: Retired  Tobacco Use  . Smoking status: Former Smoker    Packs/day: 3.00    Years: 31.00    Pack years: 93.00    Quit date: 08/01/1987    Years since quitting: 32.3  . Smokeless tobacco: Never Used  Vaping Use  . Vaping Use: Never used  Substance and Sexual Activity  . Alcohol use: No    Comment: once a year  . Drug use: No  . Sexual activity: Not Currently  Other Topics Concern  . Not on file  Social History Narrative   09/22/19   From: the area   Living: with partner - Bunnie Philips (dementia) 1984   Work: retired - Administrator, arts [employer]      Family: 1 son - but does not have a relationship      Enjoys: golf, reading      Exercise: golf once a week, yardwork   Diet: not great due to caring for wife      Safety   Seat belts: Yes    Guns: Yes  and secure   Safe in relationships: Yes    Social Determinants of Health   Financial Resource Strain:   . Difficulty of Paying Living Expenses:   Food Insecurity:   . Worried About Charity fundraiser in the Last Year:   . Arboriculturist in the Last Year:   Transportation Needs:   . Lexicographer (Medical):   Marland Kitchen Lack of Transportation (Non-Medical):   Physical Activity:   . Days of Exercise per Week:   . Minutes of Exercise per Session:   Stress:   . Feeling of Stress :   Social Connections:   . Frequency of Communication with Friends and Family:   . Frequency of Social Gatherings with Friends and Family:   . Attends Religious Services:   . Active Member of Clubs or Organizations:   . Attends Archivist Meetings:   Marland Kitchen Marital Status:   Intimate Partner Violence:   . Fear of Current or Ex-Partner:   . Emotionally Abused:   Marland Kitchen Physically Abused:   . Sexually Abused:     FAMILY HISTORY: Family History  Problem Relation Age of Onset  . Alzheimer's disease Father   . Lung cancer Sister 31  treated surgically    ALLERGIES:  is allergic to indocin [indomethacin] and penicillins.  MEDICATIONS:  Current Outpatient Medications  Medication Sig Dispense Refill  . allopurinol (ZYLOPRIM) 300 MG tablet Take 1 tablet (300 mg total) by mouth daily. 90 tablet 3  . aspirin EC 81 MG tablet Take 81 mg by mouth as needed.    . Potassium 99 MG TABS Take 297 mg by mouth daily.     . pravastatin (PRAVACHOL) 20 MG tablet Take 1 tablet (20 mg total) by mouth daily. 90 tablet 3   No current facility-administered medications for this visit.     PHYSICAL EXAMINATION: ECOG PERFORMANCE STATUS: 0 - Asymptomatic Vitals:   11/11/19 0927  BP: 126/65  Pulse: 86  Resp: 20  Temp: 97.8 F (36.6 C)  SpO2: 97%   Filed Weights   11/11/19 0927  Weight: 226 lb 6.4 oz (102.7 kg)    Physical Exam Constitutional:      General: He is not in acute distress. HENT:     Head: Normocephalic and atraumatic.  Eyes:     General: No scleral icterus. Cardiovascular:     Rate and Rhythm: Normal rate and regular rhythm.     Heart sounds: Normal heart sounds.  Pulmonary:     Effort: Pulmonary effort is normal. No respiratory distress.     Breath sounds: No wheezing.    Abdominal:     General: Bowel sounds are normal. There is no distension.     Palpations: Abdomen is soft.  Musculoskeletal:        General: No deformity. Normal range of motion.     Cervical back: Normal range of motion and neck supple.  Skin:    General: Skin is warm and dry.     Findings: No erythema or rash.  Neurological:     Mental Status: He is alert and oriented to person, place, and time. Mental status is at baseline.     Cranial Nerves: No cranial nerve deficit.     Coordination: Coordination normal.  Psychiatric:        Mood and Affect: Mood normal.     CMP Latest Ref Rng & Units 09/22/2019  Glucose 70 - 99 mg/dL 95  BUN 6 - 23 mg/dL 14  Creatinine 0.40 - 1.50 mg/dL 1.05  Sodium 135 - 145 mEq/L 140  Potassium 3.5 - 5.1 mEq/L 4.3  Chloride 96 - 112 mEq/L 105  CO2 19 - 32 mEq/L 25  Calcium 8.4 - 10.5 mg/dL 9.4  Total Protein 6.0 - 8.3 g/dL 7.4  Total Bilirubin 0.2 - 1.2 mg/dL 1.2  Alkaline Phos 39 - 117 U/L 72  AST 0 - 37 U/L 33  ALT 0 - 53 U/L 32   CBC Latest Ref Rng & Units 09/22/2019  WBC 4.0 - 10.5 K/uL 12.4(H)  Hemoglobin 13.0 - 17.0 g/dL 15.2  Hematocrit 39 - 52 % 45.6  Platelets 150 - 400 K/uL 274.0     RADIOGRAPHIC STUDIES: I have personally reviewed the radiological images as listed and agreed with the findings in the report.  CT HEAD W & WO CONTRAST  Result Date: 10/04/2019 CLINICAL DATA:  Chronic headache with no new features. Symptoms for 3 months after a fall. History of prostate cancer. EXAM: CT HEAD WITHOUT AND WITH CONTRAST TECHNIQUE: Contiguous axial images were obtained from the base of the skull through the vertex without and with intravenous contrast CONTRAST:  22m OMNIPAQUE IOHEXOL 300 MG/ML  SOLN COMPARISON:  Brain MRI 05/11/2017  FINDINGS: Brain: No evidence of acute infarction, hemorrhage, hydrocephalus, extra-axial collection or mass lesion/mass effect. No abnormal intracranial enhancement. Age normal brain volume and white matter  appearance Vascular: Major vessels are enhancing Skull: Normal. Negative for fracture or focal lesion. Sinuses/Orbits: No pathologic finding. Bilateral cataract resection. IMPRESSION: Negative head CT. Electronically Signed   By: Monte Fantasia M.D.   On: 10/04/2019 08:21     LABORATORY DATA:  I have reviewed the data as listed Lab Results  Component Value Date   WBC 12.4 (H) 09/22/2019   HGB 15.2 09/22/2019   HCT 45.6 09/22/2019   MCV 100.9 (H) 09/22/2019   PLT 274.0 09/22/2019   Recent Labs    11/19/18 2300 11/19/18 2300 11/21/18 1015 11/22/18 0913 09/22/19 1240  NA 136   < > 140 139 140  K 4.0   < > 4.2 3.5 4.3  CL 105   < > 108 108 105  CO2 22   < > 24 21* 25  GLUCOSE 114*   < > 97 127* 95  BUN 23   < > '15 8 14  ' CREATININE 1.16   < > 0.98 1.00 1.05  CALCIUM 8.9   < > 8.1* 8.3* 9.4  GFRNONAA 59*  --  >60 >60  --   GFRAA >60  --  >60 >60  --   PROT 6.7  --   --   --  7.4  ALBUMIN 3.9  --   --   --  4.4  AST 41  --   --   --  33  ALT 33  --   --   --  32  ALKPHOS 52  --   --   --  72  BILITOT 1.0  --   --   --  1.2   < > = values in this interval not displayed.   Iron/TIBC/Ferritin/ %Sat No results found for: IRON, TIBC, FERRITIN, IRONPCTSAT      ASSESSMENT & PLAN:  1. Leukocytosis, unspecified type   2. History of prostate cancer   3. Hyperbilirubinemia    Labs reviewed and discussed with patient that Leukocytosis, predominantly neutrophilia, can be secondary to infection, chronic inflammation, autoimmune disease, or underlying bone marrow disorders.   For the work up of patient's leukocytosis, I recommend checking CBC;CMP, LDH, smear review, flowcytometry,hepatitis, HIV, multiple myeloma panel, etc.  # History of PSA in 2014 Last PSA was checked in September 2020 and was <0.1  # Increased total bilirubinemia, check direct bilirubin level.    Orders Placed This Encounter  Procedures  . CBC with Differential/Platelet    Standing Status:   Future     Standing Expiration Date:   11/10/2020  . Technologist smear review    Standing Status:   Future    Standing Expiration Date:   11/10/2020  . Flow cytometry panel-leukemia/lymphoma work-up    Standing Status:   Future    Standing Expiration Date:   11/10/2020  . Multiple Myeloma Panel (SPEP&IFE w/QIG)    Standing Status:   Future    Standing Expiration Date:   11/10/2020  . Kappa/lambda light chains    Standing Status:   Future    Standing Expiration Date:   11/10/2020  . Lactate dehydrogenase    Standing Status:   Future    Standing Expiration Date:   11/10/2020  . Hepatitis panel, acute    Standing Status:   Future    Standing Expiration Date:   11/10/2020  .  HIV Antibody (routine testing w rflx)    Standing Status:   Future    Standing Expiration Date:   11/10/2020    All questions were answered. The patient knows to call the clinic with any problems questions or concerns.  Return of visit: 2 weeks to discuss labs. Thank you for this kind referral and the opportunity to participate in the care of this patient. A copy of today's note is routed to referring provider   Earlie Server, MD, PhD Hematology Oncology [facility] Pager- [phone] 11/11/2019

## 2019-11-12 LAB — KAPPA/LAMBDA LIGHT CHAINS
Kappa free light chain: 36.9 mg/L — ABNORMAL HIGH (ref 3.3–19.4)
Kappa, lambda light chain ratio: 2.41 — ABNORMAL HIGH (ref 0.26–1.65)
Lambda free light chains: 15.3 mg/L (ref 5.7–26.3)

## 2019-11-13 LAB — COMP PANEL: LEUKEMIA/LYMPHOMA

## 2019-11-14 LAB — MULTIPLE MYELOMA PANEL, SERUM
Albumin SerPl Elph-Mcnc: 4 g/dL (ref 2.9–4.4)
Albumin/Glob SerPl: 1.3 (ref 0.7–1.7)
Alpha 1: 0.2 g/dL (ref 0.0–0.4)
Alpha2 Glob SerPl Elph-Mcnc: 0.7 g/dL (ref 0.4–1.0)
B-Globulin SerPl Elph-Mcnc: 1.1 g/dL (ref 0.7–1.3)
Gamma Glob SerPl Elph-Mcnc: 1 g/dL (ref 0.4–1.8)
Globulin, Total: 3.1 g/dL (ref 2.2–3.9)
IgA: 400 mg/dL (ref 61–437)
IgG (Immunoglobin G), Serum: 1064 mg/dL (ref 603–1613)
IgM (Immunoglobulin M), Srm: 69 mg/dL (ref 15–143)
Total Protein ELP: 7.1 g/dL (ref 6.0–8.5)

## 2019-11-26 ENCOUNTER — Inpatient Hospital Stay: Payer: Medicare Other

## 2019-11-26 ENCOUNTER — Other Ambulatory Visit: Payer: Self-pay

## 2019-11-26 ENCOUNTER — Encounter: Payer: Self-pay | Admitting: Oncology

## 2019-11-26 ENCOUNTER — Inpatient Hospital Stay: Payer: Medicare Other | Attending: Oncology | Admitting: Oncology

## 2019-11-26 VITALS — BP 140/86 | HR 86 | Temp 96.8°F | Resp 18 | Wt 225.9 lb

## 2019-11-26 DIAGNOSIS — E785 Hyperlipidemia, unspecified: Secondary | ICD-10-CM | POA: Insufficient documentation

## 2019-11-26 DIAGNOSIS — Z85828 Personal history of other malignant neoplasm of skin: Secondary | ICD-10-CM | POA: Diagnosis not present

## 2019-11-26 DIAGNOSIS — Z8546 Personal history of malignant neoplasm of prostate: Secondary | ICD-10-CM | POA: Diagnosis not present

## 2019-11-26 DIAGNOSIS — Z87891 Personal history of nicotine dependence: Secondary | ICD-10-CM | POA: Insufficient documentation

## 2019-11-26 DIAGNOSIS — G4733 Obstructive sleep apnea (adult) (pediatric): Secondary | ICD-10-CM | POA: Diagnosis not present

## 2019-11-26 DIAGNOSIS — I2510 Atherosclerotic heart disease of native coronary artery without angina pectoris: Secondary | ICD-10-CM | POA: Diagnosis not present

## 2019-11-26 DIAGNOSIS — Z801 Family history of malignant neoplasm of trachea, bronchus and lung: Secondary | ICD-10-CM | POA: Insufficient documentation

## 2019-11-26 DIAGNOSIS — Z7982 Long term (current) use of aspirin: Secondary | ICD-10-CM | POA: Insufficient documentation

## 2019-11-26 DIAGNOSIS — D72829 Elevated white blood cell count, unspecified: Secondary | ICD-10-CM | POA: Insufficient documentation

## 2019-11-26 DIAGNOSIS — Z79899 Other long term (current) drug therapy: Secondary | ICD-10-CM | POA: Diagnosis not present

## 2019-11-26 NOTE — Progress Notes (Signed)
Patient denies new problems/concerns today.   °

## 2019-11-26 NOTE — Progress Notes (Signed)
Hematology/Oncology Consult note [facility] [phone] [phone]   Patient Care Team: [name], MD as PCP - General (Family Medicine) Beverly Gust, MD (Otolaryngology) Ralene Bathe, MD (Dermatology) Corey Skains, MD as Consulting Physician (Cardiology) Birder Robson, MD (Ophthalmology)  REFERRING PROVIDER: [name], MD  CHIEF COMPLAINTS/REASON FOR VISIT:  Follow-up for leukocytosis  HISTORY OF PRESENTING ILLNESS:  Phillip Nelson is a  85 y.o.  male with PMH listed below who was referred to me for evaluation of leukocytosis Reviewed patient' recent labs obtained by PCP.   09/22/2019 CBC showed elevated white count of 12.4, predominantly neutrophilia Previous lab records reviewed. Leukocytosis onset of chronic, duration is since at least 2014.  No aggravating or elevated factors. Associated symptoms or signs:  Denies weight loss, fever, chills,night sweats.  Fatigue Smoking history: Former smoker, he quitted in 1989 History of recent oral steroid use or steroid injection: Denies History of recent infection: Denies Autoimmune disease history.  Denies  Patient has a history of prostate cancer, diagnosed in 2014, Gleason score 3+3, PSA was 8 at the time of diagnosis.  Per patient, he has had reactive seed implant in 2014.Marland Kitchen  He follows up with dermatologist for squamous cell carcinoma and precancer lesions. He denies any chronic wound or infection.  Denies any constitutional symptoms. Reports chronic cough due to postnasal drip, productive for clear phlegm.    INTERVAL HISTORY Phillip Nelson is a 85 y.o. male who has above history reviewed by me today presents for follow up visit for leukocytosis Problems and complaints are listed below: Patient had blood work done after last visit.  He presents to discuss lab results.  No new complaints.  Review of Systems  Constitutional: Negative for appetite  change, chills, fatigue, fever and unexpected weight change.  HENT:   Negative for hearing loss and voice change.        Sinus drainage  Eyes: Negative for eye problems and icterus.  Respiratory: Negative for chest tightness, cough and shortness of breath.   Cardiovascular: Negative for chest pain and leg swelling.  Gastrointestinal: Negative for abdominal distention and abdominal pain.  Endocrine: Negative for hot flashes.  Genitourinary: Negative for difficulty urinating, dysuria and frequency.   Musculoskeletal: Negative for arthralgias.  Skin: Negative for itching and rash.  Neurological: Negative for light-headedness and numbness.  Hematological: Negative for adenopathy. Does not bruise/bleed easily.  Psychiatric/Behavioral: Negative for confusion.     MEDICAL HISTORY:  Past Medical History:  Diagnosis Date  . Angina at [facility])   . Aural vertigo   . Basal cell carcinoma    L mid infraclavicular, R post shoulder  . Colon polyps   . Coronary artery disease [place]  . Diverticulitis   . ED (erectile dysfunction)   . Frequent headaches   . Gout, arthritis   . History of skin cancer   . Hyperlipidemia   . OSA (obstructive sleep apnea)    CPAP NON-COMPLIANT  . Prostate cancer ([place]) 05/08/12   Adenocarcinoma,gleason=3+3=6,PSA=8.26,vol=57cc  . Squamous cell carcinoma, arm, right    R mid volar forearm    SURGICAL HISTORY: Past Surgical History:  Procedure Laterality Date  . APPENDECTOMY     Life was saved with emerg. surgery.   Marland Kitchen CARDIAC SURGERY    . CARDIOVASCULAR STRESS TEST  09-07-2011  dr Saralyn Pilar   normal perfusion/ ef 57%/ no ischemia  . CARPECTOMY Right    wrist  . CATARACT EXTRACTION W/ INTRAOCULAR LENS  IMPLANT, BILATERAL    . COLONOSCOPY WITH PROPOFOL N/A 10/31/2016   Procedure: COLONOSCOPY WITH PROPOFOL;  Surgeon: Christene Lye, MD;  Location: [facility] ENDOSCOPY;  Service: Endoscopy;  Laterality: N/A;  . CORONARY ARTERY BYPASS GRAFT  JULY 2005   ([place])   LIMA TO THE LAD  . DORSAL COMPARTMENT RELEASE Left 06/25/2017   Procedure: LEFT WRIST TENDON SHEATH RELEASE;  Surgeon: Milly Jakob, MD;  Location: [facility];  Service: Orthopedics;  Laterality: Left;  . EMBOLIZATION N/A 11/21/2018   Procedure: EMBOLIZATION ([place]);  Surgeon: Katha Cabal, MD;  Location: [place] [facility];  Service: Cardiovascular;  Laterality: N/A;  . FOOT SURGERY Bilateral    GOUT  . KNEE ARTHROSCOPY Bilateral   . LEFT ELBOW SURGERY  AGE 40  &  2007  . RADIOACTIVE SEED IMPLANT N/A 08/02/2012   Procedure: RADIOACTIVE SEED IMPLANT;  Surgeon: Claybon Jabs, MD;  Location: [facility];  Service: Urology;  Laterality: N/A;  . TOTAL KNEE ARTHROPLASTY Bilateral LEFT 2012/   RIGHT 2006  . TRANSTHORACIC ECHOCARDIOGRAM  11-24-2010   normal lvf &  lvh/ mild mr, tr, and ai    SOCIAL HISTORY: Social History   Socioeconomic History  . Marital status: Soil scientist    Spouse name: Blanch Media  . Number of children: 1  . Years of education: Not on file  . Highest education level: Some college, no degree  Occupational History  . Occupation: Retired  Tobacco Use  . Smoking status: Former Smoker    Packs/day: 3.00    Years: 31.00    Pack years: 93.00    Quit date: 08/01/1987    Years since quitting: 32.3  . Smokeless tobacco: Never Used  Vaping Use  . Vaping Use: Never used  Substance and Sexual Activity  . Alcohol use: No    Comment: once a year  . Drug use: No  . Sexual activity: Not Currently  Other Topics Concern  . Not on file  Social History Narrative   09/22/19   From: the area   Living: with partner - Bunnie Philips (dementia) 1984   Work: retired - Administrator, [employer]      Family: 1 son - but does not have a relationship      Enjoys: golf, reading      Exercise: golf once a week, yardwork   Diet: not great due to caring for wife      Safety   Seat belts: Yes    Guns: Yes  and secure   Safe in  relationships: Yes    Social Determinants of Health   Financial Resource Strain:   . Difficulty of Paying Living Expenses: Not on file  Food Insecurity:   . Worried About Charity fundraiser in the Last Year: Not on file  . Ran Out of Food in the Last Year: Not on file  Transportation Needs:   . Lack of Transportation (Medical): Not on file  . Lack of Transportation (Non-Medical): Not on file  Physical Activity:   . Days of Exercise per Week: Not on file  . Minutes of Exercise per Session: Not on file  Stress:   . Feeling of Stress : Not on file  Social Connections:   . Frequency of Communication with Friends and Family: Not on file  . Frequency of Social Gatherings with Friends and Family: Not on file  . Attends Religious Services: Not on file  . Active Member of Clubs  or Organizations: Not on file  . Attends Archivist Meetings: Not on file  . Marital Status: Not on file  Intimate Partner Violence:   . Fear of Current or Ex-Partner: Not on file  . Emotionally Abused: Not on file  . Physically Abused: Not on file  . Sexually Abused: Not on file    FAMILY HISTORY: Family History  Problem Relation Age of Onset  . Alzheimer's disease Father   . Lung cancer Sister 56       treated surgically    ALLERGIES:  is allergic to indocin [indomethacin] and penicillins.  MEDICATIONS:  Current Outpatient Medications  Medication Sig Dispense Refill  . allopurinol (ZYLOPRIM) 300 MG tablet Take 1 tablet (300 mg total) by mouth daily. 90 tablet 3  . aspirin EC 81 MG tablet Take 81 mg by mouth as needed.    . Potassium 99 MG TABS Take 297 mg by mouth daily.     . pravastatin (PRAVACHOL) 20 MG tablet Take 1 tablet (20 mg total) by mouth daily. 90 tablet 3   No current facility-administered medications for this visit.     PHYSICAL EXAMINATION: ECOG PERFORMANCE STATUS: 0 - Asymptomatic Vitals:   11/26/19 0953  BP: 140/86  Pulse: 86  Resp: 18  Temp: (!) 96.8 F (36 C)     Filed Weights   11/26/19 0953  Weight: 225 lb 14.4 oz (102.5 kg)    Physical Exam Constitutional:      General: He is not in acute distress. HENT:     Head: Normocephalic and atraumatic.  Eyes:     General: No scleral icterus. Cardiovascular:     Rate and Rhythm: Normal rate and regular rhythm.     Heart sounds: Normal heart sounds.  Pulmonary:     Effort: Pulmonary effort is normal. No respiratory distress.     Breath sounds: No wheezing.  Abdominal:     General: Bowel sounds are normal. There is no distension.     Palpations: Abdomen is soft.  Musculoskeletal:        General: No deformity. Normal range of motion.     Cervical back: Normal range of motion and neck supple.  Skin:    General: Skin is warm and dry.     Findings: No erythema or rash.  Neurological:     Mental Status: He is alert and oriented to person, place, and time. Mental status is at baseline.     Cranial Nerves: No cranial nerve deficit.     Coordination: Coordination normal.  Psychiatric:        Mood and Affect: Mood normal.     CMP Latest Ref Rng & Units 11/11/2019  Glucose 70 - 99 mg/dL 92  BUN 8 - 23 mg/dL 14  Creatinine 0.61 - 1.24 mg/dL 1.05  Sodium 135 - 145 mmol/L 139  Potassium 3.5 - 5.1 mmol/L 4.1  Chloride 98 - 111 mmol/L 106  CO2 22 - 32 mmol/L 24  Calcium 8.9 - 10.3 mg/dL 8.9  Total Protein 6.5 - 8.1 g/dL 7.6  Total Bilirubin 0.3 - 1.2 mg/dL 1.6(H)  Alkaline Phos 38 - 126 U/L 64  AST 15 - 41 U/L 44(H)  ALT 0 - 44 U/L 42   CBC Latest Ref Rng & Units 11/11/2019  WBC 4.0 - 10.5 K/uL 9.4  Hemoglobin 13.0 - 17.0 g/dL 15.4  Hematocrit 39 - 52 % 44.3  Platelets 150 - 400 K/uL 254     RADIOGRAPHIC STUDIES: I have  personally reviewed the radiological images as listed and agreed with the findings in the report.  CT HEAD W & WO CONTRAST  Result Date: 10/04/2019 CLINICAL DATA:  Chronic headache with no new features. Symptoms for 3 months after a fall. History of prostate cancer.  EXAM: CT HEAD WITHOUT AND WITH CONTRAST TECHNIQUE: Contiguous axial images were obtained from the base of the skull through the vertex without and with intravenous contrast CONTRAST:  40mL OMNIPAQUE IOHEXOL 300 MG/ML  SOLN COMPARISON:  Brain MRI 05/11/2017 FINDINGS: Brain: No evidence of acute infarction, hemorrhage, hydrocephalus, extra-axial collection or mass lesion/mass effect. No abnormal intracranial enhancement. Age normal brain volume and white matter appearance Vascular: Major vessels are enhancing Skull: Normal. Negative for fracture or focal lesion. Sinuses/Orbits: No pathologic finding. Bilateral cataract resection. IMPRESSION: Negative head CT. Electronically Signed   By: Monte Fantasia M.D.   On: 10/04/2019 08:21     LABORATORY DATA:  I have reviewed the data as listed Lab Results  Component Value Date   WBC 9.4 11/11/2019   HGB 15.4 11/11/2019   HCT 44.3 11/11/2019   MCV 96.9 11/11/2019   PLT 254 11/11/2019   Recent Labs    09/22/19 1240 11/11/19 0951 11/11/19 0958  NA 140  --  139  K 4.3  --  4.1  CL 105  --  106  CO2 25  --  24  GLUCOSE 95  --  92  BUN 14  --  14  CREATININE 1.05  --  1.05  CALCIUM 9.4  --  8.9  GFRNONAA  --   --  >60  GFRAA  --   --  >60  PROT 7.4  --  7.6  ALBUMIN 4.4  --  4.2  AST 33  --  44*  ALT 32  --  42  ALKPHOS 72  --  64  BILITOT 1.2  --  1.6*  BILIDIR  --  0.2  --    Iron/TIBC/Ferritin/ %Sat No results found for: IRON, TIBC, FERRITIN, IRONPCTSAT      ASSESSMENT & PLAN:  1. Leukocytosis, unspecified type   Leukocytosis #Labs reviewed and discussed with patient. Patient had no monoclonal protein on protein electrophoresis, he has polyclonal increase detected in 1 or more immunoglobulins, compatible with chronic inflammation.  He has slightly increased free light chain ratio.  I will check urine protein electrophoresis.  LDH is normal, negative hepatitis panel negative HIV.  Repeat CBC 2 weeks ago showed normalization of total  white count. Flow cytometry showed negative immunophenotypic abnormality, Discussed with patient that I think the previous leukocytosis is most likely reactive to chronic inflammation.  Patient has chronic sinus drainage which may attribute to the chronically elevated leukocytosis. I will hold off additional work-up at this point.  I will discharge him to follow-up with primary care provider and I am happy to see him again if he develops related concerning symptoms or lab results.  # History of prostate cancer in 2014 Last PSA was checked in September 2020 and was <0.1.  Recommend patient continue follow-up with PCP/urology    Orders Placed This Encounter  Procedures  . Protein Electro, Random Urine    Standing Status:   Future    Number of Occurrences:   1    Standing Expiration Date:   11/25/2020    All questions were answered. The patient knows to call the clinic with any problems questions or concerns.  Return of visit: Discharged from clinic.  Follow-up with primary  care provider Thank you for this kind referral and the opportunity to participate in the care of this patient. A copy of today's note is routed to referring provider   Earlie Server, MD, PhD Hematology Oncology [facility] Pager- [phone] 11/26/2019

## 2019-11-27 LAB — PROTEIN ELECTRO, RANDOM URINE
Albumin ELP, Urine: 0 %
Alpha-1-Globulin, U: 0 %
Alpha-2-Globulin, U: 0 %
Beta Globulin, U: 0 %
Gamma Globulin, U: 0 %
Total Protein, Urine: 4.6 mg/dL

## 2019-12-29 ENCOUNTER — Ambulatory Visit: Payer: Medicare Other | Attending: Internal Medicine

## 2019-12-29 DIAGNOSIS — Z23 Encounter for immunization: Secondary | ICD-10-CM

## 2019-12-29 NOTE — Progress Notes (Signed)
   Covid-19 Vaccination Clinic  Name:  Phillip Nelson    MRN: [id number] DOB: [birth year]  12/29/2019  Phillip Nelson was observed post Covid-19 immunization for 15 minutes without incident. He was provided with Vaccine Information Sheet and instruction to access the V-Safe system.   Phillip Nelson was instructed to call 911 with any severe reactions post vaccine: Marland Kitchen Difficulty breathing  . Swelling of face and throat  . A fast heartbeat  . A bad rash all over body  . Dizziness and weakness

## 2020-01-12 DIAGNOSIS — Z23 Encounter for immunization: Secondary | ICD-10-CM | POA: Diagnosis not present

## 2020-01-19 ENCOUNTER — Other Ambulatory Visit: Payer: Self-pay

## 2020-01-19 ENCOUNTER — Ambulatory Visit (INDEPENDENT_AMBULATORY_CARE_PROVIDER_SITE_OTHER): Payer: Medicare Other | Admitting: Dermatology

## 2020-01-19 DIAGNOSIS — L578 Other skin changes due to chronic exposure to nonionizing radiation: Secondary | ICD-10-CM

## 2020-01-19 DIAGNOSIS — I2510 Atherosclerotic heart disease of native coronary artery without angina pectoris: Secondary | ICD-10-CM

## 2020-01-19 DIAGNOSIS — D0462 Carcinoma in situ of skin of left upper limb, including shoulder: Secondary | ICD-10-CM

## 2020-01-19 DIAGNOSIS — L820 Inflamed seborrheic keratosis: Secondary | ICD-10-CM

## 2020-01-19 DIAGNOSIS — D485 Neoplasm of uncertain behavior of skin: Secondary | ICD-10-CM

## 2020-01-19 DIAGNOSIS — L570 Actinic keratosis: Secondary | ICD-10-CM | POA: Diagnosis not present

## 2020-01-19 NOTE — Patient Instructions (Signed)

## 2020-01-19 NOTE — Progress Notes (Signed)
Follow-Up Visit   Subjective  Phillip Nelson is a 85 y.o. male who presents for the following: Actinic Keratosis (follow up of arms and hands treated with LN2 x >20). He has other areas to be evaluated today.  He has thick sores on his arms.  The following portions of the chart were reviewed this encounter and updated as appropriate:  Tobacco  Allergies  Meds  Problems  Med Hx  Surg Hx  Fam Hx     Review of Systems:  No other skin or systemic complaints except as noted in HPI or Assessment and Plan.  Objective  Well appearing patient in no apparent distress; mood and affect are within normal limits.  A focused examination was performed including scalp, face, arms, hands. Relevant physical exam findings are noted in the Assessment and Plan.  Objective  Left forearm prox dorsum: 0.6 cm cutaneous horn  Objective  Left forearm prox medial: 2.1 cm hyperkeratotic plaque  Objective  Arms, hands, face (17): Erythematous thin papules/macules with gritty scale.   Objective  Arms, hands (16): Erythematous keratotic or waxy stuck-on papule or plaque.    Assessment & Plan    Actinic Damage - diffuse scaly erythematous macules with underlying dyspigmentation - Recommend daily broad spectrum sunscreen SPF 30+ to sun-exposed areas, reapply every 2 hours as needed.  - Call for new or changing lesions.   Neoplasm of uncertain behavior of skin (2) Left forearm prox dorsum  Epidermal / dermal shaving  Lesion diameter (cm):  0.6 Informed consent: discussed and consent obtained   Timeout: patient name, date of birth, surgical site, and procedure verified   Procedure prep:  Patient was prepped and draped in usual sterile fashion Prep type:  Isopropyl alcohol Anesthesia: the lesion was anesthetized in a standard fashion   Anesthetic:  1% lidocaine w/ epinephrine 1-100,000 buffered w/ 8.4% NaHCO3 Instrument used: flexible razor blade   Hemostasis achieved with: pressure,  aluminum chloride and electrodesiccation   Outcome: patient tolerated procedure well   Post-procedure details: sterile dressing applied and wound care instructions given   Dressing type: bandage and petrolatum    Destruction of lesion Complexity: extensive   Destruction method: electrodesiccation and curettage   Informed consent: discussed and consent obtained   Timeout:  patient name, date of birth, surgical site, and procedure verified Procedure prep:  Patient was prepped and draped in usual sterile fashion Prep type:  Isopropyl alcohol Anesthesia: the lesion was anesthetized in a standard fashion   Anesthetic:  1% lidocaine w/ epinephrine 1-100,000 buffered w/ 8.4% NaHCO3 Curettage performed in three different directions: Yes   Electrodesiccation performed over the curetted area: Yes   Lesion length (cm):  0.6 Lesion width (cm):  0.6 Margin per side (cm):  0.2 Final wound size (cm):  1 Hemostasis achieved with:  pressure and aluminum chloride Outcome: patient tolerated procedure well with no complications   Post-procedure details: sterile dressing applied and wound care instructions given   Dressing type: bandage and petrolatum    Specimen 1 - Surgical pathology Differential Diagnosis: SCC vs other  Check Margins: No 0.6 cm cutaneous horn  Left forearm prox medial  Epidermal / dermal shaving  Lesion diameter (cm):  2.1 Informed consent: discussed and consent obtained   Timeout: patient name, date of birth, surgical site, and procedure verified   Procedure prep:  Patient was prepped and draped in usual sterile fashion Prep type:  Isopropyl alcohol Anesthesia: the lesion was anesthetized in a standard fashion  Anesthetic:  1% lidocaine w/ epinephrine 1-100,000 buffered w/ 8.4% NaHCO3 Instrument used: flexible razor blade   Hemostasis achieved with: pressure, aluminum chloride and electrodesiccation   Outcome: patient tolerated procedure well   Post-procedure details:  sterile dressing applied and wound care instructions given   Dressing type: bandage and petrolatum    Destruction of lesion Complexity: extensive   Destruction method: electrodesiccation and curettage   Informed consent: discussed and consent obtained   Timeout:  patient name, date of birth, surgical site, and procedure verified Procedure prep:  Patient was prepped and draped in usual sterile fashion Prep type:  Isopropyl alcohol Anesthesia: the lesion was anesthetized in a standard fashion   Anesthetic:  1% lidocaine w/ epinephrine 1-100,000 buffered w/ 8.4% NaHCO3 Curettage performed in three different directions: Yes   Electrodesiccation performed over the curetted area: Yes   Lesion length (cm):  2.1 Lesion width (cm):  2.1 Margin per side (cm):  0.2 Final wound size (cm):  2.5 Hemostasis achieved with:  pressure and aluminum chloride Outcome: patient tolerated procedure well with no complications   Post-procedure details: sterile dressing applied and wound care instructions given   Dressing type: bandage and petrolatum    Specimen 2 - Surgical pathology Differential Diagnosis: SCC vs other  Check Margins: No 2.1 cm hyperkeratotic plaque  AK (actinic keratosis) (17) Arms, hands, face  Destruction of lesion - Arms, hands, face Complexity: simple   Destruction method: cryotherapy   Informed consent: discussed and consent obtained   Timeout:  patient name, date of birth, surgical site, and procedure verified Lesion destroyed using liquid nitrogen: Yes   Region frozen until ice ball extended beyond lesion: Yes   Outcome: patient tolerated procedure well with no complications   Post-procedure details: wound care instructions given    Inflamed seborrheic keratosis (16) Arms, hands  Destruction of lesion - Arms, hands Complexity: simple   Destruction method: cryotherapy   Informed consent: discussed and consent obtained   Timeout:  patient name, date of birth, surgical  site, and procedure verified Lesion destroyed using liquid nitrogen: Yes   Region frozen until ice ball extended beyond lesion: Yes   Outcome: patient tolerated procedure well with no complications   Post-procedure details: wound care instructions given    Return for AK follow up in 6-12 weeks.  I, Ashok Cordia, CMA, am acting as scribe for Sarina Ser, MD .  Documentation: I have reviewed the above documentation for accuracy and completeness, and I agree with the above.  Sarina Ser, MD

## 2020-01-20 ENCOUNTER — Encounter: Payer: Self-pay | Admitting: Dermatology

## 2020-01-29 ENCOUNTER — Telehealth: Payer: Self-pay

## 2020-01-29 NOTE — Telephone Encounter (Signed)
-----   Message from Ralene Bathe, MD sent at 01/26/2020  8:15 AM EDT ----- Diagnosis 1. Skin , left forearm prox dorsum SQUAMOUS CELL CARCINOMA IN SITU, BASE INVOLVED 2. Skin , left forearm prox medial SQUAMOUS CELL CARCINOMA IN SITU, HYPERTROPHIC  1&2 - both Cancer - SCC in situ Superficial Both already treated Recheck next visit

## 2020-01-29 NOTE — Telephone Encounter (Signed)
Patient informed of pathology results 

## 2020-03-31 ENCOUNTER — Other Ambulatory Visit: Payer: Self-pay

## 2020-03-31 ENCOUNTER — Ambulatory Visit (INDEPENDENT_AMBULATORY_CARE_PROVIDER_SITE_OTHER): Payer: Medicare Other | Admitting: Dermatology

## 2020-03-31 ENCOUNTER — Encounter: Payer: Self-pay | Admitting: Dermatology

## 2020-03-31 DIAGNOSIS — Z86007 Personal history of in-situ neoplasm of skin: Secondary | ICD-10-CM

## 2020-03-31 DIAGNOSIS — C44329 Squamous cell carcinoma of skin of other parts of face: Secondary | ICD-10-CM | POA: Diagnosis not present

## 2020-03-31 DIAGNOSIS — L570 Actinic keratosis: Secondary | ICD-10-CM | POA: Diagnosis not present

## 2020-03-31 DIAGNOSIS — L578 Other skin changes due to chronic exposure to nonionizing radiation: Secondary | ICD-10-CM | POA: Diagnosis not present

## 2020-03-31 DIAGNOSIS — L820 Inflamed seborrheic keratosis: Secondary | ICD-10-CM | POA: Diagnosis not present

## 2020-03-31 DIAGNOSIS — R21 Rash and other nonspecific skin eruption: Secondary | ICD-10-CM | POA: Diagnosis not present

## 2020-03-31 DIAGNOSIS — C44719 Basal cell carcinoma of skin of left lower limb, including hip: Secondary | ICD-10-CM

## 2020-03-31 DIAGNOSIS — D492 Neoplasm of unspecified behavior of bone, soft tissue, and skin: Secondary | ICD-10-CM

## 2020-03-31 MED ORDER — TERBINAFINE HCL 250 MG PO TABS: 250.0000 mg | ORAL_TABLET | Freq: Every day | ORAL | 0 refills | Status: DC

## 2020-03-31 NOTE — Patient Instructions (Signed)
Cryotherapy Aftercare  . Wash gently with soap and water everyday.   . Apply Vaseline and Band-Aid daily until healed.  Prior to procedure, discussed risks of blister formation, small wound, skin dyspigmentation, or rare scar following cryotherapy.    Wound Care Instructions  1. Cleanse wound gently with soap and water once a day then pat dry with clean gauze. Apply a thing coat of Petrolatum (petroleum jelly, "Vaseline") over the wound (unless you have an allergy to this). We recommend that you use a new, sterile tube of Vaseline. Do not pick or remove scabs. Do not remove the yellow or white "healing tissue" from the base of the wound.  2. Cover the wound with fresh, clean, nonstick gauze and secure with paper tape. You may use Band-Aids in place of gauze and tape if the would is small enough, but would recommend trimming much of the tape off as there is often too much. Sometimes Band-Aids can irritate the skin.  3. You should call the office for your biopsy report after 1 week if you have not already been contacted.  4. If you experience any problems, such as abnormal amounts of bleeding, swelling, significant bruising, significant pain, or evidence of infection, please call the office immediately.  5. FOR ADULT SURGERY PATIENTS: If you need something for pain relief you may take 1 extra strength Tylenol (acetaminophen) AND 2 Ibuprofen (200mg each) together every 4 hours as needed for pain. (do not take these if you are allergic to them or if you have a reason you should not take them.) Typically, you may only need pain medication for 1 to 3 days.     

## 2020-03-31 NOTE — Progress Notes (Unsigned)
Follow-Up Visit   Subjective  Phillip Nelson is a 86 y.o. male who presents for the following: Actinic Keratosis (10 week recheck. Pink scaly lesions. Face, arms, hands.), Rash (Left medial calf. Dur: 4 weeks. Redness, larger. Used OTC anti itch cream, helped with itching. Using Rexall Anti-itch. No Hx of liver issues. Does not take cholesterol medication.), and Follow-up (Hx SCC's. Left dorsal forearm medial and proximal. Bx and Tx with EDC. 01/19/2020.).  The following portions of the chart were reviewed this encounter and updated as appropriate:  Tobacco  Allergies  Meds  Problems  Med Hx  Surg Hx  Fam Hx     Review of Systems: No other skin or systemic complaints except as noted in HPI or Assessment and Plan.  Objective  Well appearing patient in no apparent distress; mood and affect are within normal limits.  A focused examination was performed including face, arms, hands. Relevant physical exam findings are noted in the Assessment and Plan.  Objective  Left Ear x1, arms and hands x3 (4): Erythematous thin papules/macules with gritty scale.   Objective  Left medial infraorbital: 29mm crusted papule       Objective  Left lateral pretibital area: 1.5cm erythematous scaly plaque      Objective  Right Dorsal Hand x1: Erythematous keratotic or waxy stuck-on papule or plaque.   Objective  Left medial calf: Consistent with tinea.   Erythematous patch   Assessment & Plan    History of Squamous Cell Carcinoma in Situ of the Skin - No evidence of recurrence today at left dorsal forearm, prox. And medial - Recommend regular full body skin exams - Recommend daily broad spectrum sunscreen SPF 30+ to sun-exposed areas, reapply every 2 hours as needed.  - Call if any new or changing lesions are noted between office visits  AK (actinic keratosis) Left Ear x1, arms and hands x3 Destruction of lesion - Left Ear x1, arms and hands x3 Complexity: simple    Destruction method: cryotherapy   Informed consent: discussed and consent obtained   Timeout:  patient name, date of birth, surgical site, and procedure verified Lesion destroyed using liquid nitrogen: Yes   Region frozen until ice ball extended beyond lesion: Yes   Outcome: patient tolerated procedure well with no complications   Post-procedure details: wound care instructions given    Neoplasm of skin (2) Left medial infraorbital  Skin / nail biopsy Type of biopsy: tangential   Informed consent: discussed and consent obtained   Timeout: patient name, date of birth, surgical site, and procedure verified   Procedure prep:  Patient was prepped and draped in usual sterile fashion Prep type:  Isopropyl alcohol Anesthesia: the lesion was anesthetized in a standard fashion   Anesthetic:  1% lidocaine w/ epinephrine 1-100,000 buffered w/ 8.4% NaHCO3 Instrument used: flexible razor blade   Hemostasis achieved with: pressure, aluminum chloride and electrodesiccation   Outcome: patient tolerated procedure well   Post-procedure details: sterile dressing applied and wound care instructions given   Dressing type: bandage and petrolatum    Specimen 1 - Surgical pathology Differential Diagnosis: R/O BCC  Check Margins: No 53mm crusted papule  Left lateral pretibital area  Epidermal / dermal shaving  Lesion diameter (cm):  1.5 Informed consent: discussed and consent obtained   Timeout: patient name, date of birth, surgical site, and procedure verified   Procedure prep:  Patient was prepped and draped in usual sterile fashion Prep type:  Isopropyl alcohol Anesthesia: the lesion was  anesthetized in a standard fashion   Anesthetic:  1% lidocaine w/ epinephrine 1-100,000 buffered w/ 8.4% NaHCO3 Instrument used: flexible razor blade   Hemostasis achieved with: pressure, aluminum chloride and electrodesiccation   Outcome: patient tolerated procedure well   Post-procedure details: sterile  dressing applied and wound care instructions given   Dressing type: bandage and petrolatum    Destruction of lesion Complexity: extensive   Destruction method: electrodesiccation and curettage   Informed consent: discussed and consent obtained   Timeout:  patient name, date of birth, surgical site, and procedure verified Procedure prep:  Patient was prepped and draped in usual sterile fashion Prep type:  Isopropyl alcohol Anesthesia: the lesion was anesthetized in a standard fashion   Anesthetic:  1% lidocaine w/ epinephrine 1-100,000 buffered w/ 8.4% NaHCO3 Curettage performed in three different directions: Yes   Electrodesiccation performed over the curetted area: Yes   Curettage cycles:  3 Lesion length (cm):  1.5 Lesion width (cm):  1.5 Final wound size (cm):  1.8 Hemostasis achieved with:  pressure and aluminum chloride Outcome: patient tolerated procedure well with no complications   Post-procedure details: sterile dressing applied and wound care instructions given   Dressing type: bandage and petrolatum    Specimen 2 - Surgical pathology Differential Diagnosis: R/O BCC  Check Margins: No 1.5cm erythematous scaly plaque  Inflamed seborrheic keratosis Right Dorsal Hand x1 Destruction of lesion - Right Dorsal Hand x1 Complexity: simple   Destruction method: cryotherapy   Informed consent: discussed and consent obtained   Timeout:  patient name, date of birth, surgical site, and procedure verified Lesion destroyed using liquid nitrogen: Yes   Region frozen until ice ball extended beyond lesion: Yes   Outcome: patient tolerated procedure well with no complications   Post-procedure details: wound care instructions given    Rash - Tinea Corporis Left medial calf Chronic; persistent Consistent with Tinea. Start Terbinafine 250mg  1 po qd  terbinafine (LAMISIL) 250 MG tablet - Left medial calf  Actinic Damage - chronic, secondary to cumulative UV radiation exposure/sun  exposure over time - diffuse scaly erythematous macules with underlying dyspigmentation - Recommend daily broad spectrum sunscreen SPF 30+ to sun-exposed areas, reapply every 2 hours as needed.  - Call for new or changing lesions.  Return in about 6 weeks (around 05/12/2020) for recheck biopsy site.  I, Emelia Salisbury, CMA, am acting as scribe for Sarina Ser, MD.  Documentation: I have reviewed the above documentation for accuracy and completeness, and I agree with the above.  Sarina Ser, MD

## 2020-04-01 ENCOUNTER — Encounter: Payer: Self-pay | Admitting: Dermatology

## 2020-04-05 ENCOUNTER — Telehealth: Payer: Self-pay

## 2020-04-05 NOTE — Telephone Encounter (Signed)
Patient informed of pathology results and appointment already scheduled.

## 2020-04-05 NOTE — Telephone Encounter (Signed)
-----   Message from Ralene Bathe, MD sent at 04/01/2020  6:14 PM EST ----- Diagnosis 1. Skin , left medial infraorbital WELL DIFFERENTIATED SQUAMOUS CELL CARCINOMA, ACANTHOLYTIC (ADENOID) VARIANT, CRUSTED 2. Skin , left lateral pretibial area BASAL CELL CARCINOMA, NODULAR PATTERN, ULCERATED  1- Cancer - SCC Schedule for treatment (plan EDC) 2- Cancer - BCC Already treated Recheck next visit

## 2020-05-12 ENCOUNTER — Ambulatory Visit (INDEPENDENT_AMBULATORY_CARE_PROVIDER_SITE_OTHER): Payer: Medicare Other | Admitting: Dermatology

## 2020-05-12 ENCOUNTER — Other Ambulatory Visit: Payer: Self-pay

## 2020-05-12 ENCOUNTER — Encounter: Payer: Self-pay | Admitting: Dermatology

## 2020-05-12 DIAGNOSIS — L578 Other skin changes due to chronic exposure to nonionizing radiation: Secondary | ICD-10-CM

## 2020-05-12 DIAGNOSIS — C44319 Basal cell carcinoma of skin of other parts of face: Secondary | ICD-10-CM | POA: Diagnosis not present

## 2020-05-12 DIAGNOSIS — Z85828 Personal history of other malignant neoplasm of skin: Secondary | ICD-10-CM | POA: Diagnosis not present

## 2020-05-12 NOTE — Patient Instructions (Signed)

## 2020-05-12 NOTE — Progress Notes (Signed)
   Follow-Up Visit   Subjective  Phillip Nelson is a 86 y.o. male who presents for the following: Basal Cell Carcinoma (Biopsy proven of left medial infraorbital - EDC today), Squamous Cell Carcinoma (Biopsy proven of left lat pretibial - biopsy and EDC 03/31/20 - still not healed), and Actinic Keratosis (Follow up of left ear, arms/hands treated with LN2).  The following portions of the chart were reviewed this encounter and updated as appropriate:   Tobacco  Allergies  Meds  Problems  Med Hx  Surg Hx  Fam Hx     Review of Systems:  No other skin or systemic complaints except as noted in HPI or Assessment and Plan.  Objective  Well appearing patient in no apparent distress; mood and affect are within normal limits.  A focused examination was performed including face, left lower leg. Relevant physical exam findings are noted in the Assessment and Plan.  Objective  Left lat pretibial: 2.0 cm crusted ulcer   Objective  Left Malar Cheek: 2.1 x 1.7 cm pink plaque      Assessment & Plan    Actinic Damage - chronic, secondary to cumulative UV radiation exposure/sun exposure over time - diffuse scaly erythematous macules with underlying dyspigmentation - Recommend daily broad spectrum sunscreen SPF 30+ to sun-exposed areas, reapply every 2 hours as needed.  - Call for new or changing lesions.  History of SCC (squamous cell carcinoma) of skin with persistent healing ulcer and crust Left lat pretibial Mechanical debridement today followed by Purycin, mupirocin, telfa and coban.  Basal cell carcinoma (BCC) of skin of other part of face Left Malar Cheek Discussed treatment options.  Recommend consideration for XRT. Ambulatory referral to Oncology  Biopsy proven BCC  Recommend radiation therapy due to size of cancer. Will refer patient to [facility] for evaluation.  Return in about 5 months (around 10/09/2020).  I, Ashok Cordia, CMA, am acting as scribe for Sarina Ser, MD .  Documentation: I have reviewed the above documentation for accuracy and completeness, and I agree with the above.  Sarina Ser, MD

## 2020-05-13 DIAGNOSIS — C44722 Squamous cell carcinoma of skin of right lower limb, including hip: Secondary | ICD-10-CM | POA: Diagnosis not present

## 2020-05-13 DIAGNOSIS — C44311 Basal cell carcinoma of skin of nose: Secondary | ICD-10-CM | POA: Diagnosis not present

## 2020-06-10 DIAGNOSIS — Z85828 Personal history of other malignant neoplasm of skin: Secondary | ICD-10-CM | POA: Diagnosis not present

## 2020-06-10 DIAGNOSIS — C44719 Basal cell carcinoma of skin of left lower limb, including hip: Secondary | ICD-10-CM | POA: Diagnosis not present

## 2020-06-10 DIAGNOSIS — C44321 Squamous cell carcinoma of skin of nose: Secondary | ICD-10-CM | POA: Diagnosis not present

## 2020-06-17 DIAGNOSIS — C44319 Basal cell carcinoma of skin of other parts of face: Secondary | ICD-10-CM | POA: Diagnosis not present

## 2020-06-17 DIAGNOSIS — C44321 Squamous cell carcinoma of skin of nose: Secondary | ICD-10-CM | POA: Diagnosis not present

## 2020-06-17 DIAGNOSIS — Z85828 Personal history of other malignant neoplasm of skin: Secondary | ICD-10-CM | POA: Diagnosis not present

## 2020-06-23 DIAGNOSIS — Z85828 Personal history of other malignant neoplasm of skin: Secondary | ICD-10-CM | POA: Diagnosis not present

## 2020-06-23 DIAGNOSIS — C44719 Basal cell carcinoma of skin of left lower limb, including hip: Secondary | ICD-10-CM | POA: Diagnosis not present

## 2020-06-23 DIAGNOSIS — L988 Other specified disorders of the skin and subcutaneous tissue: Secondary | ICD-10-CM | POA: Diagnosis not present

## 2020-09-09 ENCOUNTER — Telehealth: Payer: Self-pay

## 2020-09-09 ENCOUNTER — Ambulatory Visit (INDEPENDENT_AMBULATORY_CARE_PROVIDER_SITE_OTHER): Payer: Medicare Other

## 2020-09-09 ENCOUNTER — Other Ambulatory Visit: Payer: Self-pay

## 2020-09-09 DIAGNOSIS — Z0000 Encounter for general adult medical examination without abnormal findings: Secondary | ICD-10-CM

## 2020-09-09 MED ORDER — ALLOPURINOL 300 MG PO TABS: 300.0000 mg | ORAL_TABLET | Freq: Every day | ORAL | 3 refills | Status: DC

## 2020-09-09 NOTE — Progress Notes (Addendum)
Subjective:   Phillip Nelson is a 86 y.o. male who presents for Medicare Annual/Subsequent preventive examination.  Review of Systems: N/A      I connected with the patient today by telephone and verified that I am speaking with the correct person using two identifiers. Location patient: home Location nurse: work Persons participating in the telephone visit: patient, nurse.   I discussed the limitations, risks, security and privacy concerns of performing an evaluation and management service by telephone and the availability of in person appointments. I also discussed with the patient that there may be a patient responsible charge related to this service. The patient expressed understanding and verbally consented to this telephonic visit.        Cardiac Risk Factors include: advanced age (>65men, >66 women);male gender;Other (see comment), Risk factor comments: hyperlipidemia     Objective:    Today's Vitals   There is no height or weight on file to calculate BMI.  Advanced Directives 09/09/2020 11/26/2019 11/11/2019 12/17/2018 11/20/2018 11/19/2018 12/11/2017  Does Patient Have a Medical Advance Directive? No Yes Yes Yes No No Yes  Type of Advance Directive - Healthcare Power of [place]  Does patient want to make changes to medical advance directive? - - Yes (ED - Information included in AVS) - - - -  Copy of [place] in Chart? - - - No - copy requested - - No - copy requested  Would patient like information on creating a medical advance directive? No - Patient declined - - - No - Patient declined - -  Pre-existing out of facility DNR order (yellow form or pink MOST form) - - - - - - -    Current Medications (verified) Outpatient Encounter Medications as of 09/09/2020  Medication Sig   allopurinol (ZYLOPRIM) 300 MG tablet Take 1 tablet (300 mg total) by mouth daily.    aspirin EC 81 MG tablet Take 81 mg by mouth as needed.   Potassium 99 MG TABS Take 297 mg by mouth daily.    pravastatin (PRAVACHOL) 20 MG tablet Take 1 tablet (20 mg total) by mouth daily. (Patient not taking: Reported on 09/09/2020)   terbinafine (LAMISIL) 250 MG tablet Take 1 tablet (250 mg total) by mouth daily. (Patient not taking: Reported on 09/09/2020)   No facility-administered encounter medications on file as of 09/09/2020.    Allergies (verified) Indocin [indomethacin] and Penicillins   History: Past Medical History:  Diagnosis Date   Angina at rest [facility])    Aural vertigo    Basal cell carcinoma 12/31/2006   L mid infraclavicular. Excised 02/27/2007, margins free.   Basal cell carcinoma 10/30/2016    R post shoulder. Nodular pattern.   Basal cell carcinoma 03/31/2020   L lat pretibial - ED&C    Colon polyps    Coronary artery disease [place]   Diverticulitis    ED (erectile dysfunction)    Frequent headaches    Gout, arthritis    History of skin cancer    Hyperlipidemia    OSA (obstructive sleep apnea)    CPAP NON-COMPLIANT   Prostate cancer ([place]) 05/08/12   Adenocarcinoma,gleason=3+3=6,PSA=8.26,vol=57cc   Squamous cell carcinoma, arm, left    L forearm prox dorsum    Squamous cell carcinoma, arm, left    L forearm prox medial    Squamous cell carcinoma, arm, right 08/23/2017   R mid volar forearm  Past Surgical History:  Procedure Laterality Date   APPENDECTOMY     Life was saved with emerg. surgery.    CARDIAC SURGERY     CARDIOVASCULAR STRESS TEST  09-07-2011  dr Saralyn Pilar   normal perfusion/ ef 57%/ no ischemia   CARPECTOMY Right    wrist   CATARACT EXTRACTION W/ INTRAOCULAR LENS  IMPLANT, BILATERAL     COLONOSCOPY WITH PROPOFOL N/A 10/31/2016   Procedure: COLONOSCOPY WITH PROPOFOL;  Surgeon: Christene Lye, MD;  Location: [facility] ENDOSCOPY;  Service: Endoscopy;  Laterality: N/A;   CORONARY ARTERY BYPASS GRAFT  JULY 2005  ([place])   LIMA TO THE  LAD   DORSAL COMPARTMENT RELEASE Left 06/25/2017   Procedure: LEFT WRIST TENDON SHEATH RELEASE;  Surgeon: Milly Jakob, MD;  Location: [place];  Service: Orthopedics;  Laterality: Left;   EMBOLIZATION N/A 11/21/2018   Procedure: EMBOLIZATION ([place]);  Surgeon: Katha Cabal, MD;  Location: [place] [facility];  Service: Cardiovascular;  Laterality: N/A;   FOOT SURGERY Bilateral    GOUT   KNEE ARTHROSCOPY Bilateral    LEFT ELBOW SURGERY  AGE 58  &  2007   RADIOACTIVE SEED IMPLANT N/A 08/02/2012   Procedure: RADIOACTIVE SEED IMPLANT;  Surgeon: Claybon Jabs, MD;  Location: [facility];  Service: Urology;  Laterality: N/A;   TOTAL KNEE ARTHROPLASTY Bilateral LEFT 2012/   RIGHT 2006   TRANSTHORACIC ECHOCARDIOGRAM  11-24-2010   normal lvf &  lvh/ mild mr, tr, and ai   Family History  Problem Relation Age of Onset   Alzheimer's disease Father    Lung cancer Sister 64       treated surgically   Social History   Socioeconomic History   Marital status: Soil scientist    Spouse name: Blanch Media   Number of children: 1   Years of education: Not on file   Highest education level: Some college, no degree  Occupational History   Occupation: Retired  Tobacco Use   Smoking status: Former    Packs/day: 3.00    Years: 31.00    Pack years: 93.00    Types: Cigarettes    Quit date: 08/01/1987    Years since quitting: 33.1   Smokeless tobacco: Never  Vaping Use   Vaping Use: Never used  Substance and Sexual Activity   Alcohol use: No    Comment: once a year   Drug use: No   Sexual activity: Not Currently  Other Topics Concern   Not on file  Social History Narrative   09/22/19   From: the area   Living: with partner - Bunnie Philips (dementia) 1984   Work: retired - Administrator, [employer]      Family: 1 son - but does not have a relationship      Enjoys: golf, reading      Exercise: golf once a week, yardwork   Diet: not great due to caring for  wife      Safety   Seat belts: Yes    Guns: Yes  and secure   Safe in relationships: Yes    Social Determinants of Health   Financial Resource Strain: Low Risk    Difficulty of Paying Living Expenses: Not hard at all  Food Insecurity: No Food Insecurity   Worried About Charity fundraiser in the Last Year: Never true   [place] in the Last Year: Never true  Transportation Needs: No Transportation Needs  Lack of Transportation (Medical): No   Lack of Transportation (Non-Medical): No  Physical Activity: Inactive   Days of Exercise per Week: 0 days   Minutes of Exercise per Session: 0 min  Stress: No Stress Concern Present   Feeling of Stress : Not at all  Social Connections: Not on file    Tobacco Counseling Counseling given: Not Answered   Clinical Intake:  Pre-visit preparation completed: Yes  Pain : No/denies pain     Nutritional Risks: None Diabetes: No  How often do you need to have someone help you when you read instructions, pamphlets, or other written materials from your doctor or pharmacy?: 1 - Never  Diabetic: No Nutrition Risk Assessment:  Has the patient had any N/V/D within the last 2 months?  No  Does the patient have any non-healing wounds?  No  Has the patient had any unintentional weight loss or weight gain?  No   Diabetes:  Is the patient diabetic?  No  If diabetic, was a CBG obtained today?   N/A Did the patient bring in their glucometer from home?   N/A How often do you monitor your CBG's? N/A.   Financial Strains and Diabetes Management:  Are you having any financial strains with the device, your supplies or your medication?  N/A .  Does the patient want to be seen by Chronic Care Management for management of their diabetes?   N/A Would the patient like to be referred to a Nutritionist or for Diabetic Management?   N/A Interpreter Needed?: No  Information entered by :: CJohnson, LPN   Activities of Daily Living In your  present state of health, do you have any difficulty performing the following activities: 09/09/2020  Hearing? N  Vision? N  Difficulty concentrating or making decisions? N  Walking or climbing stairs? N  Dressing or bathing? N  Doing errands, shopping? N  Preparing Food and eating ? N  Using the Toilet? N  In the past six months, have you accidently leaked urine? N  Do you have problems with loss of bowel control? N  Managing your Medications? N  Managing your Finances? N  Housekeeping or managing your Housekeeping? N  Some recent data might be hidden    Patient Care Team: [name], MD as PCP - General (Family Medicine) Beverly Gust, MD (Otolaryngology) Ralene Bathe, MD (Dermatology) Corey Skains, MD as Consulting Physician (Cardiology) Birder Robson, MD (Ophthalmology)  Indicate any recent Medical Services you may have received from other than [facility] providers in the past year (date may be approximate).     Assessment:   This is a routine wellness examination for [place].  Hearing/Vision screen Vision Screening - Comments:: Patient gets annual eye exams  Dietary issues and exercise activities discussed: Current Exercise Habits: The patient does not participate in regular exercise at present, Exercise limited by: None identified   Goals Addressed             This Visit's Progress    Patient Stated       09/09/2020, I will continue to play golf every Sunday for 4-5 hours.         Depression Screen PHQ 2/9 Scores 09/09/2020 09/22/2019 12/17/2018 12/03/2018 12/11/2017 10/29/2017 07/10/2016  PHQ - 2 Score 0 0 0 1 0 0 0  PHQ- 9 Score 0 1 - 4 - - -    Fall Risk Fall Risk  09/09/2020 12/17/2018 12/11/2017 10/29/2017 10/25/2017  Falls in the past year? 1  0 No No No  Comment - - - - Emmi Telephone Survey: data to providers prior to load  Number falls in past yr: 0 - - - -  Injury with Fall? 1 - - - -  Risk for fall due to : No Fall Risks - - - -  Follow up  Falls evaluation completed;Falls prevention discussed - - - -    FALL RISK PREVENTION PERTAINING TO THE HOME:  Any stairs in or around the home? Yes  If so, are there any without handrails? No  Home free of loose throw rugs in walkways, pet beds, electrical cords, etc? Yes  Adequate lighting in your home to reduce risk of falls? Yes   ASSISTIVE DEVICES UTILIZED TO PREVENT FALLS:  Life alert? No  Use of a cane, walker or w/c? No  Grab bars in the bathroom? No  Shower chair or bench in shower? No  Elevated toilet seat or a handicapped toilet? No   TIMED UP AND GO:  Was the test performed?  N/A telephone visit .    Cognitive Function: MMSE - Mini Mental State Exam 09/09/2020  Not completed: Refused     Mini Cog  Mini-Cog screen was not completed. Patient refused. Maximum score is 22. A value of 0 denotes this part of the MMSE was not completed or the patient failed this part of the Mini-Cog screening.  6CIT Screen 12/11/2017  What Year? 0 points  What month? 0 points  What time? 0 points  Count back from 20 0 points  Months in reverse 0 points  Repeat phrase 0 points  Total Score 0    Immunizations Immunization History  Administered Date(s) Administered   Fluad Quad(high Dose 65+) 12/24/2018   Influenza, High Dose Seasonal PF 02/02/2015, 01/10/2017, 01/10/2018   Influenza-Unspecified 01/02/2013, 01/08/2014, 02/02/2015   PFIZER(Purple Top)SARS-COV-2 Vaccination 04/17/2019, 05/08/2019, 12/29/2019, 07/16/2020   Pneumococcal Conjugate-13 08/10/2010   Pneumococcal Polysaccharide-23 01/02/2003, 08/10/2014   Tdap 08/10/2014    TDAP status: Completed at today's visit  Flu Vaccine status: Up to date  Pneumococcal vaccine status: Up to date  Covid-19 vaccine status: Completed vaccines  Qualifies for Shingles Vaccine? Yes   Zostavax completed No   Shingrix Completed?: No.    Education has been provided regarding the importance of this vaccine. Patient has been advised  to call insurance company to determine out of pocket expense if they have not yet received this vaccine. Advised may also receive vaccine at local pharmacy or Health Dept. Verbalized acceptance and understanding.  Screening Tests Health Maintenance  Topic Date Due   Zoster Vaccines- Shingrix (1 of 2) 12/10/2020 (Originally 02/22/1954)   INFLUENZA VACCINE  10/25/2020   COVID-19 Vaccine (5 - Booster for Pfizer series) 11/15/2020   TETANUS/TDAP  08/09/2024   PNA vac Low Risk Adult  Completed   HPV VACCINES  Aged Out    Health Maintenance  There are no preventive care reminders to display for this patient.   Colorectal cancer screening: No longer required.   Lung Cancer Screening: (Low Dose CT Chest recommended if Age 60-80 years, 30 pack-year currently smoking OR have quit w/in 15years.) does not qualify.   Additional Screening:  Hepatitis C Screening: does not qualify; Completed N/A  Vision Screening: Recommended annual ophthalmology exams for early detection of glaucoma and other disorders of the eye. Is the patient up to date with their annual eye exam?  Yes  Who is the provider or what is the name of the office in  which the patient attends annual eye exams? Dr. George Ina If pt is not established with a provider, would they like to be referred to a provider to establish care? No .   Dental Screening: Recommended annual dental exams for proper oral hygiene  Community Resource Referral / Chronic Care Management: CRR required this visit?  No   CCM required this visit?  No      Plan:     I have personally reviewed and noted the following in the patient's chart:   Medical and social history Use of alcohol, tobacco or illicit drugs  Current medications and supplements including opioid prescriptions. Patient is not currently taking opioid prescriptions. Functional ability and status Nutritional status Physical activity Advanced directives List of other  physicians Hospitalizations, surgeries, and ER visits in previous 12 months Vitals Screenings to include cognitive, depression, and falls Referrals and appointments  In addition, I have reviewed and discussed with patient certain preventive protocols, quality metrics, and best practice recommendations. A written personalized care plan for preventive services as well as general preventive health recommendations were provided to patient.   Due to this being a telephonic visit, the after visit summary with patients personalized plan was offered to patient via office or my-chart. Patient preferred to pick up at office at next visit or via mychart.   Andrez Grime, LPN   10/13/9196

## 2020-09-09 NOTE — Progress Notes (Signed)
PCP notes:  Health Maintenance: Shingrix- declined   Abnormal Screenings: none   Patient concerns: none   Nurse concerns: none   Next PCP appt.: none

## 2020-09-09 NOTE — Telephone Encounter (Signed)
Refill of allopurinol sent to pharmacy.   Does not appear to be schedule for part 2 of physical.   Please have patient schedule visit with me and we can discuss CPAP and congestion

## 2020-09-09 NOTE — Patient Instructions (Signed)
Phillip Nelson , Thank you for taking time to come for your Medicare Wellness Visit. I appreciate your ongoing commitment to your health goals. Please review the following plan we discussed and let me know if I can assist you in the future.   Screening recommendations/referrals: Colonoscopy: no longer required  Recommended yearly ophthalmology/optometry visit for glaucoma screening and checkup Recommended yearly dental visit for hygiene and checkup  Vaccinations: Influenza vaccine: Up to date, completed 01/12/2020, due 10/2020 Pneumococcal vaccine: Completed series Tdap vaccine: Up to date, completed 08/10/2014, due 07/2024 Shingles vaccine: due, check with your insurance regarding coverage if interested    Covid-19: Completed series  Advanced directives: Advance directive discussed with you today. Even though you declined this today please call our office should you change your mind and we can give you the proper paperwork for you to fill out.  Conditions/risks identified: hyperlipidemia   Next appointment: Follow up in one year for your annual wellness visit.   Preventive Care 35 Years and Older, Male Preventive care refers to lifestyle choices and visits with your health care provider that can promote health and wellness. What does preventive care include? A yearly physical exam. This is also called an annual well check. Dental exams once or twice a year. Routine eye exams. Ask your health care provider how often you should have your eyes checked. Personal lifestyle choices, including: Daily care of your teeth and gums. Regular physical activity. Eating a healthy diet. Avoiding tobacco and drug use. Limiting alcohol use. Practicing safe sex. Taking low doses of aspirin every day. Taking vitamin and mineral supplements as recommended by your health care provider. What happens during an annual well check? The services and screenings done by your health care provider during your  annual well check will depend on your age, overall health, lifestyle risk factors, and family history of disease. Counseling  Your health care provider may ask you questions about your: Alcohol use. Tobacco use. Drug use. Emotional well-being. Home and relationship well-being. Sexual activity. Eating habits. History of falls. Memory and ability to understand (cognition). Work and work Statistician. Screening  You may have the following tests or measurements: Height, weight, and BMI. Blood pressure. Lipid and cholesterol levels. These may be checked every 5 years, or more frequently if you are over 37 years old. Skin check. Lung cancer screening. You may have this screening every year starting at age 63 if you have a 30-pack-year history of smoking and currently smoke or have quit within the past 15 years. Fecal occult blood test (FOBT) of the stool. You may have this test every year starting at age 70. Flexible sigmoidoscopy or colonoscopy. You may have a sigmoidoscopy every 5 years or a colonoscopy every 10 years starting at age 80. Prostate cancer screening. Recommendations will vary depending on your family history and other risks. Hepatitis C blood test. Hepatitis B blood test. Sexually transmitted disease (STD) testing. Diabetes screening. This is done by checking your blood sugar (glucose) after you have not eaten for a while (fasting). You may have this done every 1-3 years. Abdominal aortic aneurysm (AAA) screening. You may need this if you are a current or former smoker. Osteoporosis. You may be screened starting at age 54 if you are at high risk. Talk with your health care provider about your test results, treatment options, and if necessary, the need for more tests. Vaccines  Your health care provider may recommend certain vaccines, such as: Influenza vaccine. This is recommended every year. Tetanus,  diphtheria, and acellular pertussis (Tdap, Td) vaccine. You may need a Td  booster every 10 years. Zoster vaccine. You may need this after age 89. Pneumococcal 13-valent conjugate (PCV13) vaccine. One dose is recommended after age 58. Pneumococcal polysaccharide (PPSV23) vaccine. One dose is recommended after age 75. Talk to your health care provider about which screenings and vaccines you need and how often you need them. This information is not intended to replace advice given to you by your health care provider. Make sure you discuss any questions you have with your health care provider. Document Released: 04/09/2015 Document Revised: 12/01/2015 Document Reviewed: 01/12/2015 Elsevier Interactive Patient Education  2017 [place] Prevention in the Home Falls can cause injuries. They can happen to people of all ages. There are many things you can do to make your home safe and to help prevent falls. What can I do on the outside of my home? Regularly fix the edges of walkways and driveways and fix any cracks. Remove anything that might make you trip as you walk through a door, such as a raised step or threshold. Trim any bushes or trees on the path to your home. Use bright outdoor lighting. Clear any walking paths of anything that might make someone trip, such as rocks or tools. Regularly check to see if handrails are loose or broken. Make sure that both sides of any steps have handrails. Any raised decks and porches should have guardrails on the edges. Have any leaves, snow, or ice cleared regularly. Use sand or salt on walking paths during winter. Clean up any spills in your garage right away. This includes oil or grease spills. What can I do in the bathroom? Use night lights. Install grab bars by the toilet and in the tub and shower. Do not use towel bars as grab bars. Use non-skid mats or decals in the tub or shower. If you need to sit down in the shower, use a plastic, non-slip stool. Keep the floor dry. Clean up any water that spills on the floor  as soon as it happens. Remove soap buildup in the tub or shower regularly. Attach bath mats securely with double-sided non-slip rug tape. Do not have throw rugs and other things on the floor that can make you trip. What can I do in the bedroom? Use night lights. Make sure that you have a light by your bed that is easy to reach. Do not use any sheets or blankets that are too big for your bed. They should not hang down onto the floor. Have a firm chair that has side arms. You can use this for support while you get dressed. Do not have throw rugs and other things on the floor that can make you trip. What can I do in the kitchen? Clean up any spills right away. Avoid walking on wet floors. Keep items that you use a lot in easy-to-reach places. If you need to reach something above you, use a strong step stool that has a grab bar. Keep electrical cords out of the way. Do not use floor polish or wax that makes floors slippery. If you must use wax, use non-skid floor wax. Do not have throw rugs and other things on the floor that can make you trip. What can I do with my stairs? Do not leave any items on the stairs. Make sure that there are handrails on both sides of the stairs and use them. Fix handrails that are broken or loose. Make  sure that handrails are as long as the stairways. Check any carpeting to make sure that it is firmly attached to the stairs. Fix any carpet that is loose or worn. Avoid having throw rugs at the top or bottom of the stairs. If you do have throw rugs, attach them to the floor with carpet tape. Make sure that you have a light switch at the top of the stairs and the bottom of the stairs. If you do not have them, ask someone to add them for you. What else can I do to help prevent falls? Wear shoes that: Do not have high heels. Have rubber bottoms. Are comfortable and fit you well. Are closed at the toe. Do not wear sandals. If you use a stepladder: Make sure that it is  fully opened. Do not climb a closed stepladder. Make sure that both sides of the stepladder are locked into place. Ask someone to hold it for you, if possible. Clearly mark and make sure that you can see: Any grab bars or handrails. First and last steps. Where the edge of each step is. Use tools that help you move around (mobility aids) if they are needed. These include: Canes. Walkers. Scooters. Crutches. Turn on the lights when you go into a dark area. Replace any light bulbs as soon as they burn out. Set up your furniture so you have a clear path. Avoid moving your furniture around. If any of your floors are uneven, fix them. If there are any pets around you, be aware of where they are. Review your medicines with your doctor. Some medicines can make you feel dizzy. This can increase your chance of falling. Ask your doctor what other things that you can do to help prevent falls. This information is not intended to replace advice given to you by your health care provider. Make sure you discuss any questions you have with your health care provider. Document Released: 01/07/2009 Document Revised: 08/19/2015 Document Reviewed: 04/17/2014 Elsevier Interactive Patient Education  2017 [employer].

## 2020-09-09 NOTE — Telephone Encounter (Signed)
Completed AWV. Patient is requesting a refill on his allopurinol and a prescription for a new CPAP machine. Patient is currently taking Mucinex for congestion he has every morning with no relief. He would like recommendations on what he should try to help clear this up.

## 2020-09-10 NOTE — Telephone Encounter (Signed)
Called pt to schedule a cpe. Pt did not answer so I left a voicemail to call back.

## 2020-09-13 NOTE — Telephone Encounter (Signed)
PLEASE NOTE: All timestamps contained within this report are represented as [place] Standard Time. CONFIDENTIALTY NOTICE: This fax transmission is intended only for the addressee. It contains information that is legally privileged, confidential or otherwise protected from use or disclosure. If you are not the intended recipient, you are strictly prohibited from reviewing, disclosing, copying using or disseminating any of this information or taking any action in reliance on or regarding this information. If you have received this fax in error, please notify us immediately by telephone so that we can arrange for its return to [place]. Phone: [phone], Toll-Free: [phone], Fax: [phone] Page: 1 of 1 Call Id: [id number] [place] [facility] - Client Nonclinical Telephone Record  [facility] [place] [facility] - Client Client Site [facility] [name] Contact Type Call Who Is Calling Patient / Member / Family / Caregiver Caller Name [place] Phone Number [phone] Call Type Message Only Information Provided Reason for Call Returning a Call from the Office Initial [place] states he received a call from office today regarding setting up his physical. Additional Comment Office hours provided. Disp. Time Disposition Final User 09/10/2020 5:15:09 PM General Information Provided Yes Outlaw-Simmons, Eliezer Lofts Call Closed By: Ivin Poot Transaction Date/Time: 09/10/2020 5:13:04 PM (ET)

## 2020-09-30 ENCOUNTER — Other Ambulatory Visit: Payer: Self-pay | Admitting: Family Medicine

## 2020-09-30 ENCOUNTER — Other Ambulatory Visit: Payer: Self-pay

## 2020-09-30 ENCOUNTER — Ambulatory Visit (INDEPENDENT_AMBULATORY_CARE_PROVIDER_SITE_OTHER): Payer: Medicare Other | Admitting: Family Medicine

## 2020-09-30 VITALS — BP 118/72 | HR 75 | Temp 97.8°F | Ht 69.75 in | Wt 221.5 lb

## 2020-09-30 DIAGNOSIS — Z125 Encounter for screening for malignant neoplasm of prostate: Secondary | ICD-10-CM

## 2020-09-30 DIAGNOSIS — G4733 Obstructive sleep apnea (adult) (pediatric): Secondary | ICD-10-CM | POA: Diagnosis not present

## 2020-09-30 DIAGNOSIS — M1A9XX0 Chronic gout, unspecified, without tophus (tophi): Secondary | ICD-10-CM | POA: Diagnosis not present

## 2020-09-30 DIAGNOSIS — R519 Headache, unspecified: Secondary | ICD-10-CM

## 2020-09-30 DIAGNOSIS — T466X5A Adverse effect of antihyperlipidemic and antiarteriosclerotic drugs, initial encounter: Secondary | ICD-10-CM | POA: Diagnosis not present

## 2020-09-30 DIAGNOSIS — I2510 Atherosclerotic heart disease of native coronary artery without angina pectoris: Secondary | ICD-10-CM

## 2020-09-30 DIAGNOSIS — M791 Myalgia, unspecified site: Secondary | ICD-10-CM

## 2020-09-30 DIAGNOSIS — D649 Anemia, unspecified: Secondary | ICD-10-CM

## 2020-09-30 DIAGNOSIS — Z8546 Personal history of malignant neoplasm of prostate: Secondary | ICD-10-CM | POA: Diagnosis not present

## 2020-09-30 DIAGNOSIS — R7303 Prediabetes: Secondary | ICD-10-CM

## 2020-09-30 DIAGNOSIS — E782 Mixed hyperlipidemia: Secondary | ICD-10-CM | POA: Diagnosis not present

## 2020-09-30 LAB — CBC
HCT: 44.5 % (ref 39.0–52.0)
Hemoglobin: 14.9 g/dL (ref 13.0–17.0)
MCHC: 33.5 g/dL (ref 30.0–36.0)
MCV: 98.4 fl (ref 78.0–100.0)
Platelets: 256 10*3/uL (ref 150.0–400.0)
RBC: 4.52 Mil/uL (ref 4.22–5.81)
RDW: 12.8 % (ref 11.5–15.5)
WBC: 9.5 10*3/uL (ref 4.0–10.5)

## 2020-09-30 LAB — COMPREHENSIVE METABOLIC PANEL
ALT: 27 U/L (ref 0–53)
AST: 30 U/L (ref 0–37)
Albumin: 4.3 g/dL (ref 3.5–5.2)
Alkaline Phosphatase: 63 U/L (ref 39–117)
BUN: 18 mg/dL (ref 6–23)
CO2: 23 mEq/L (ref 19–32)
Calcium: 9.3 mg/dL (ref 8.4–10.5)
Chloride: 104 mEq/L (ref 96–112)
Creatinine, Ser: 1.12 mg/dL (ref 0.40–1.50)
GFR: 60.71 mL/min (ref >60.00)
Glucose, Bld: 86 mg/dL (ref 70–99)
Potassium: 4.3 mEq/L (ref 3.5–5.1)
Sodium: 138 mEq/L (ref 135–145)
Total Bilirubin: 1.2 mg/dL (ref 0.2–1.2)
Total Protein: 7.3 g/dL (ref 6.0–8.3)

## 2020-09-30 LAB — URIC ACID: Uric Acid, Serum: 8.7 mg/dL — ABNORMAL HIGH (ref 4.0–7.8)

## 2020-09-30 LAB — LIPID PANEL
Cholesterol: 168 mg/dL (ref 0–200)
HDL: 57 mg/dL (ref >39.00)
LDL Cholesterol: 83 mg/dL (ref 0–99)
NonHDL: 110.69
Total CHOL/HDL Ratio: 3
Triglycerides: 136 mg/dL (ref 0.0–149.0)
VLDL: 27.2 mg/dL (ref 0.0–40.0)

## 2020-09-30 LAB — HEMOGLOBIN A1C: Hgb A1c MFr Bld: 5.8 % (ref 4.6–6.5)

## 2020-09-30 LAB — PSA, MEDICARE: PSA: 0 ng/ml — ABNORMAL LOW (ref 0.10–4.00)

## 2020-09-30 MED ORDER — ALLOPURINOL 300 MG PO TABS: 300.0000 mg | ORAL_TABLET | Freq: Every day | ORAL | 3 refills | Status: DC

## 2020-09-30 NOTE — Patient Instructions (Addendum)
# Sinus Drainage - Try Daily allergy medication - like claritin, zyrtec, allegra - Try Nasal spray - Flonase or Nasocort - 1 to 2 times per day   Headaches - consider making an appointment to Dr. Lorelei Pont in our office    Preventing High Cholesterol Cholesterol is a white, waxy substance similar to fat that the human body needs to help build cells. The liver makes all the cholesterol that a person's body needs. Having high cholesterol (hypercholesterolemia) increases your risk for heart disease and stroke. Extra or excess cholesterolcomes from the food that you eat. High cholesterol can often be prevented with diet and lifestyle changes. If you already have high cholesterol, you can control it with diet, lifestyle changes,and medicines. How can high cholesterol affect me? If you have high cholesterol, fatty deposits (plaques) may build up on the walls of your blood vessels. The blood vessels that carry blood away from your heart are called arteries. Plaques make the arteries narrower and stiffer. This in turn can: Restrict or block blood flow and cause blood clots to form. Increase your risk for heart attack and stroke. What can increase my risk for high cholesterol? This condition is more likely to develop in people who: Eat foods that are high in saturated fat or cholesterol. Saturated fat is mostly found in foods that come from animal sources. Are overweight. Are not getting enough exercise. Have a family history of high cholesterol (familial hypercholesterolemia). What actions can I take to prevent this? Nutrition  Eat less saturated fat. Avoid trans fats (partially hydrogenated oils). These are often found in margarine and in some baked goods, fried foods, and snacks bought in packages. Avoid precooked or cured meat, such as bacon, sausages, or meat loaves. Avoid foods and drinks that have added sugars. Eat more fruits, vegetables, and whole grains. Choose healthy sources of protein,  such as fish, poultry, lean cuts of red meat, beans, peas, lentils, and nuts. Choose healthy sources of fat, such as: Nuts. Vegetable oils, especially olive oil. Fish that have healthy fats, such as omega-3 fatty acids. These fish include mackerel or salmon.  Lifestyle Lose weight if you are overweight. Maintaining a healthy body mass index (BMI) can help prevent or control high cholesterol. It can also lower your risk for diabetes and high blood pressure. Ask your health care provider to help you with a diet and exercise plan to lose weight safely. Do not use any products that contain nicotine or tobacco, such as cigarettes, e-cigarettes, and chewing tobacco. If you need help quitting, ask your health care provider. Alcohol use Do not drink alcohol if: Your health care provider tells you not to drink. You are pregnant, may be pregnant, or are planning to become pregnant. If you drink alcohol: Limit how much you use to: 0-1 drink a day for women. 0-2 drinks a day for men. Be aware of how much alcohol is in your drink. In the [place] one drink equals one 12 oz bottle of beer (355 mL), one 5 oz glass of wine (148 mL), or one 1 oz glass of hard liquor (44 mL). Activity  Get enough exercise. Do exercises as told by your health care provider. Each week, do at least 150 minutes of exercise that takes a medium level of effort (moderate-intensity exercise). This kind of exercise: Makes your heart beat faster while allowing you to still be able to talk. Can be done in short sessions several times a day or longer sessions a few times a  week. For example, on 5 days each week, you could walk fast or ride your bike 3 times a day for 10 minutes each time.  Medicines Your health care provider may recommend medicines to help lower cholesterol. This may be a medicine to lower the amount of cholesterol that your liver makes. You may need medicine if: Diet and lifestyle changes have not lowered your  cholesterol enough. You have high cholesterol and other risk factors for heart disease or stroke. Take over-the-counter and prescription medicines only as told by your health care provider. General information Manage your risk factors for high cholesterol. Talk with your health care provider about all your risk factors and how to lower your risk. Manage other conditions that you have, such as diabetes or high blood pressure (hypertension). Have blood tests to check your cholesterol levels at regular points in time as told by your health care provider. Keep all follow-up visits as told by your health care provider. This is important. Where to find more information American Heart Association: www.heart.org National Heart, Lung, and Blood Institute: https://wilson-eaton.com/ Summary High cholesterol increases your risk for heart disease and stroke. By keeping your cholesterol level low, you can reduce your risk for these conditions. High cholesterol can often be prevented with diet and lifestyle changes. Work with your health care provider to manage your risk factors, and have your blood tested regularly. This information is not intended to replace advice given to you by your health care provider. Make sure you discuss any questions you have with your healthcare provider. Document Revised: 12/24/2018 Document Reviewed: 12/24/2018 Elsevier Patient Education  [place].

## 2020-09-30 NOTE — Assessment & Plan Note (Signed)
No cp. Stable. Continue healthy lifestyle.

## 2020-09-30 NOTE — Progress Notes (Signed)
Subjective:     Phillip Nelson is a 86 y.o. male presenting for Medicare Wellness     HPI  #Headaches - sharp pain in head "every now and then"  - short lasting - head feels heavy - notes hx since his concussion last year - having head fullness symptom daily - worse in heat settings - sharp pain can be anywhere on the head - 3 seconds - a few times a week - water: gallon per day - tea - also drinks a lot of tea - unsweet tea  - caffeine - coffee twice weekly, tea is main source - sleep is the same  #Gout - needs refills of allopurinol - ran out last week  #sinus drainage - using mucinex - taking a daily allergy medication - not interested in nasal spray   #OSA - feels he is breathing well    Review of Systems   Social History   Tobacco Use  Smoking Status Former   Packs/day: 3.00   Years: 31.00   Pack years: 93.00   Types: Cigarettes   Quit date: 08/01/1987   Years since quitting: 33.1  Smokeless Tobacco Never        Objective:    BP Readings from Last 3 Encounters:  09/30/20 118/72  11/26/19 140/86  11/11/19 126/65   Wt Readings from Last 3 Encounters:  09/30/20 221 lb 8 oz (100.5 kg)  11/26/19 225 lb 14.4 oz (102.5 kg)  11/11/19 226 lb 6.4 oz (102.7 kg)    BP 118/72   Pulse 75   Temp 97.8 F (36.6 C) (Temporal)   Ht 5' 9.75" (1.772 m)   Wt 221 lb 8 oz (100.5 kg)   SpO2 97%   BMI 32.01 kg/m    Physical Exam Constitutional:      Appearance: Normal appearance. He is not ill-appearing or diaphoretic.  HENT:     Right Ear: Tympanic membrane and external ear normal.     Left Ear: Tympanic membrane and external ear normal.     Nose: Nose normal. No congestion.     Mouth/Throat:     Mouth: Mucous membranes are moist.     Pharynx: Posterior oropharyngeal erythema present.  Eyes:     General: No scleral icterus.    Extraocular Movements: Extraocular movements intact.     Conjunctiva/sclera: Conjunctivae normal.     Pupils:  Pupils are equal, round, and reactive to light.  Cardiovascular:     Rate and Rhythm: Normal rate and regular rhythm.  Pulmonary:     Effort: Pulmonary effort is normal. No respiratory distress.     Breath sounds: Normal breath sounds. No wheezing.  Musculoskeletal:     Cervical back: Neck supple.  Skin:    General: Skin is warm and dry.  Neurological:     General: No focal deficit present.     Mental Status: He is alert. Mental status is at baseline.     Cranial Nerves: No cranial nerve deficit.     Motor: No weakness.  Psychiatric:        Mood and Affect: Mood normal.          Assessment & Plan:   Problem List Items Addressed This Visit       Cardiovascular and Mediastinum   CAD (coronary artery disease) - Primary    No cp. Stable. Continue healthy lifestyle.        Relevant Orders   Comprehensive metabolic panel     Respiratory  OSA (obstructive sleep apnea)    Pt wondering about different equipment. No recent assessment. Recommend referral, but he declined.        Relevant Orders   CBC     Other   History of prostate cancer    Lab Results  Component Value Date   PSA <0.1 12/03/2018  Will assess for signs of recurrence        Relevant Orders   PSA, Medicare   Hyperlipemia, mixed   Relevant Orders   Lipid panel   Myalgia due to statin    Hand out for healthy cholesterol diet. Encouraged exercise       Relevant Orders   Lipid panel   Prediabetes    Encouraged healthy diet/exericse. Check labs       Relevant Orders   Hemoglobin A1c   Gout    Stable. Check uric acid. Refill provided       Relevant Orders   Uric Acid   Daily headache    Pt with fall last year and notes head fullness since that time. CT scan 2021 was normal. Well hydrated with good sleep. Discussed consult with Dr. Lorelei Pont for possible post-concussive headache given onset.        Relevant Medications   allopurinol (ZYLOPRIM) 300 MG tablet   Anemia   Relevant  Orders   CBC   Other Visit Diagnoses     Encounter for screening for malignant neoplasm of prostate        Relevant Orders   PSA, Medicare        Return in about 1 year (around 09/30/2021).  [name], MD  This visit occurred during the SARS-CoV-2 public health emergency.  Safety protocols were in place, including screening questions prior to the visit, additional usage of staff PPE, and extensive cleaning of exam room while observing appropriate contact time as indicated for disinfecting solutions.

## 2020-09-30 NOTE — Assessment & Plan Note (Signed)
Encouraged healthy diet/exericse. Check labs

## 2020-09-30 NOTE — Assessment & Plan Note (Signed)
Pt with fall last year and notes head fullness since that time. CT scan 2021 was normal. Well hydrated with good sleep. Discussed consult with Dr. Lorelei Pont for possible post-concussive headache given onset.

## 2020-09-30 NOTE — Assessment & Plan Note (Signed)
Stable. Check uric acid. Refill provided

## 2020-09-30 NOTE — Assessment & Plan Note (Signed)
Pt wondering about different equipment. No recent assessment. Recommend referral, but he declined.

## 2020-09-30 NOTE — Assessment & Plan Note (Signed)
Lab Results  Component Value Date   PSA <0.1 12/03/2018   Will assess for signs of recurrence

## 2020-09-30 NOTE — Assessment & Plan Note (Signed)
Hand out for healthy cholesterol diet. Encouraged exercise

## 2020-10-06 NOTE — Progress Notes (Signed)
Phillip Orlick [name], MD, [place] at [facility] [name], [address]  Phone: [phone]  FAX: [phone]  Phillip Nelson - 86 y.o. male  MRN [id number]  Date of Birth: [birth year]  Date: 10/07/2020  PCP: [name], MD  Referral: [name], MD  Chief Complaint  Patient presents with   Fall    1 year ago but hit head on concrete    This visit occurred during the SARS-CoV-2 public health emergency.  Safety protocols were in place, including screening questions prior to the visit, additional usage of staff PPE, and extensive cleaning of exam room while observing appropriate contact time as indicated for disinfecting solutions.   Subjective:   Phillip Nelson is a 86 y.o. very pleasant male patient with Body mass index is 32.34 kg/m. who presents with the following:  He has had a a fall with a head strike approximately 1 year ago where he got tangled up in his dog's leash and his foot fell off of the sidewalk and he struck his head on the concrete.  He did have to crawl, and it took him 30 minutes to get up from this fall.  Since this time, he has had some persistent headaches. I reviewed his ACE concussion headache evaluation with the patient.  Currently he is having some headaches, and he is having some dizziness that has been present for about 10 years.  He does have pain intermittently, but it generally does last only in the order of seconds.  He does have a heaviness sensation that will happen quite a bit more extensively than this.  At this point, they are not all that bad, and he rarely is taking anything like Tylenol or ibuprofen.  Has a heaviness sensation in his head.  No migraine history.    Right now, will have some HA a lot of the time.   Will take some [facility] - some sinus congestion.  This is generally, not really associated with his headaches. Rarely with take NSAIDS or  tylenol. No impairment at all.  Will play golf and it way and bother him.  Dizziness sometimes.  Has taken some meclizine.  Has done some vestibular rehab.   Review of Systems is noted in the HPI, as appropriate  Objective:   BP 130/72   Pulse 77   Temp 97.7 F (36.5 C) (Temporal)   Ht 5' 9.75" (1.772 m)   Wt 223 lb 12 oz (101.5 kg)   SpO2 96%   BMI 32.34 kg/m   GEN: No acute distress; alert,appropriate. PULM: Breathing comfortably in no respiratory distress  Neuro: CN 2-12 grossly intact. PERRLA. EOMI. Sensation intact throughout. Str 5/5 all extremities. DTR 2+. No clonus. A and o x 4. Romberg neg. Finger nose neg. Heel -shin neg.   Saccades and Pursuits are normal.  PSYCH: Normally interactive. Conversant. Not depressed or anxious appearing.  Calm demeanor.    Laboratory and Imaging Data:  Assessment and Plan:     ICD-10-CM   1. Post-concussion headache  G44.309      Total encounter time: 30 minutes. This includes total time spent on the day of encounter.  Additional time spent on chart review.  Does have a postconcussive headache, but he does not have any other postconcussive syndrome symptoms.  I expect that he will have some headaches intermittently off and on indefinitely.  Right now, they are not particularly in inhibiting his activities,  and while he does have been relatively frequently he is rarely taking any analgesics.  I do think that taking some occasional NSAIDs or Tylenol would be reasonable.  Right now, he does not want to take any prophylactic medication.  Preferentially, with postconcussive headache I would most often use TCA with beta-blocker as an alternative.  Sedation and or blood pressure and pulse control would be the limiting factor here.  Anticonvulsants would also be an consideration as well, Topamax or Depakote.  By his preference, since he is not interested in medication and he does not think that these are that bad, I think holding off  and observation is the most appropriate step.  Follow-up: As needed  Signed,  Tildon Silveria T. Glema Takaki, MD   Outpatient Encounter Medications as of 10/07/2020  Medication Sig   allopurinol (ZYLOPRIM) 300 MG tablet Take 1 tablet (300 mg total) by mouth daily.   aspirin EC 81 MG tablet Take 81 mg by mouth as needed.   Potassium 99 MG TABS Take 297 mg by mouth daily.    No facility-administered encounter medications on file as of 10/07/2020.

## 2020-10-07 ENCOUNTER — Ambulatory Visit (INDEPENDENT_AMBULATORY_CARE_PROVIDER_SITE_OTHER): Payer: Medicare Other | Admitting: Family Medicine

## 2020-10-07 ENCOUNTER — Other Ambulatory Visit: Payer: Self-pay

## 2020-10-07 VITALS — BP 130/72 | HR 77 | Temp 97.7°F | Ht 69.75 in | Wt 223.8 lb

## 2020-10-07 DIAGNOSIS — I2510 Atherosclerotic heart disease of native coronary artery without angina pectoris: Secondary | ICD-10-CM

## 2020-10-07 DIAGNOSIS — G44309 Post-traumatic headache, unspecified, not intractable: Secondary | ICD-10-CM

## 2020-10-08 ENCOUNTER — Encounter: Payer: Self-pay | Admitting: Family Medicine

## 2020-10-11 ENCOUNTER — Ambulatory Visit: Payer: Medicare Other | Admitting: Dermatology

## 2020-10-21 ENCOUNTER — Telehealth (INDEPENDENT_AMBULATORY_CARE_PROVIDER_SITE_OTHER): Payer: Medicare Other | Admitting: Family Medicine

## 2020-10-21 ENCOUNTER — Encounter: Payer: Self-pay | Admitting: Family Medicine

## 2020-10-21 ENCOUNTER — Telehealth: Payer: Self-pay | Admitting: Family Medicine

## 2020-10-21 DIAGNOSIS — U071 COVID-19: Secondary | ICD-10-CM | POA: Diagnosis not present

## 2020-10-21 MED ORDER — NIRMATRELVIR/RITONAVIR (PAXLOVID)TABLET: 3.0000 | ORAL_TABLET | Freq: Two times a day (BID) | ORAL | 0 refills | Status: AC

## 2020-10-21 MED ORDER — BENZONATATE 100 MG PO CAPS: 100.0000 mg | ORAL_CAPSULE | Freq: Three times a day (TID) | ORAL | 0 refills | Status: DC | PRN

## 2020-10-21 NOTE — Telephone Encounter (Signed)
Pt said he tested + covid on 10/20/20 and tussin DM is not helping prod cough wth yellow phlegm; scratchy throat; no fever or SOB or CP; no other covid symptoms. Pt said he is in no distress with breathing just wanted med for cough. Pt scheduled video visit with Dr Einar Pheasant today at 11:40. Pt will have wt and temp when CMA calls and pt will need help with connecting for visit.pt is self quarantining, will drink plenty of fluids, rest and take tylenol if develops fever. UC & ED precautions given and pt voiced understanding. Sending note to DR Einar Pheasant and [place] CMA/.

## 2020-10-21 NOTE — Progress Notes (Signed)
Virtual Visit via Telephone Note  I connected with Waylan Rocher on 10/21/20 at 11:40 AM EDT by telephone and verified that I am speaking with the correct person using two identifiers.   I discussed the limitations, risks, security and privacy concerns of performing an evaluation and management service by telephone and the availability of in person appointments. I also discussed with the patient that there may be a patient responsible charge related to this service. The patient expressed understanding and agreed to proceed.  Patient location: Home Provider Location: [place] Participants: [name] and Waylan Rocher   History of Present Illness: Chief Complaint  Patient presents with   Covid Positive    Onset of symptoms 10/20/20   Cough   Nasal Congestion    Cough Pertinent negatives include no chest pain, chills, fever, myalgias, sore throat or shortness of breath.   #covid positive - 10/20/2020 - last night - covid test positive last night - Treatment: tusson cough medication, allergy spray, allergy pills -- with limited improvement   Review of Systems  Constitutional:  Negative for chills and fever.  HENT:  Positive for congestion. Negative for sinus pain and sore throat.   Respiratory:  Positive for cough. Negative for shortness of breath.   Cardiovascular:  Negative for chest pain.  Gastrointestinal:  Negative for diarrhea, nausea and vomiting.  Musculoskeletal:  Negative for myalgias.     Observations/Objective: There were no vitals taken for this visit.  Phone visit:  Patient speaking in complete sentences No distress Alert and oriented Normal mood  Assessment and Plan: Problem List Items Addressed This Visit   None Visit Diagnoses     COVID-19 virus infection    -  Primary   Relevant Medications   nirmatrelvir/ritonavir EUA (PAXLOVID) TABS   benzonatate (TESSALON PERLES) 100 MG capsule      Patient is at increased risk for  developing severe covid due to CAD, age, obesity, prediabetes. They are eligible for anti-viral medication.   Discussed Paxlovid and they would like to start. Normal liver and kidney function.   Lab Results  Component Value Date   ALT 27 09/30/2020   AST 30 09/30/2020   ALKPHOS 63 09/30/2020   BILITOT 1.2 09/30/2020    Lab Results  Component Value Date   CREATININE 1.12 09/30/2020     Medication sent to pharmacy  Reviewed ER and return precautions  Reviewed isolation guidelines.    Follow Up Instructions:  Return if symptoms worsen or fail to improve.   I discussed the assessment and treatment plan with the patient. The patient was provided an opportunity to ask questions and all were answered. The patient agreed with the plan and demonstrated an understanding of the instructions.   The patient was advised to call back or seek an in-person evaluation if the symptoms worsen or if the condition fails to improve as anticipated.  I provided 9 minutes of non-face-to-face time during this encounter.   [name], MD

## 2020-10-21 NOTE — Telephone Encounter (Signed)
Mr. Nettie called in wanted to know what to take due to he tested positive for covid. He has taken OTC cough syrup but wanted to know if its something else he can take.

## 2020-10-21 NOTE — Telephone Encounter (Signed)
See note from today

## 2020-10-21 NOTE — Telephone Encounter (Signed)
[facility] - Client TELEPHONE ADVICE RECORD [facility] Patient Name: Phillip Nelson [place] Gender: Male DOB: [birth year] Age: 86 Y 5 M Return Phone Number: [phone] (Primary), [phone] (Secondary) Address: City/ State/ Zip: [place] [address] Client [facility] - Client Client Site [place] - Day Contact Type Call Who Is Calling Patient / Member / Family / Caregiver Call Type Triage / Clinical Relationship To Patient Self Return Phone Number [phone] (Primary) Chief Complaint Cough Reason for Call Symptomatic / Request for [place] states that he has Covid and a cough. Translation No Nurse Assessment Nurse: Radford Pax, RN, Eugene Garnet Date/Time (Eastern Time): 10/21/2020 8:58:39 AM Confirm and document reason for call. If symptomatic, describe symptoms. ---Caller states that he has Covid and a cough. Has been taking Tussin DM with no relief. Does the patient have any new or worsening symptoms? ---Yes Will a triage be completed? ---Yes Related visit to physician within the last 2 weeks? ---No Does the PT have any chronic conditions? (i.e. diabetes, asthma, this includes High risk factors for pregnancy, etc.) ---No Is this a behavioral health or substance abuse call? ---No Guidelines Guideline Title Affirmed Question Affirmed Notes Nurse Date/Time (Eastern Time) COVID-19 - Diagnosed or Suspected [1] HIGH RISK for severe COVID complications (e.g., weak immune system, age > 6 years, obesity with BMI > 25, pregnant, chronic lung disease or other chronic medical condition) AND [2] COVID symptoms (e.g., cough, fever) (Exceptions: Already seen by PCP and no Turner, RN, Eugene Garnet 10/21/2020 8:58:56 AM PLEASE NOTE: All timestamps contained within this report are represented as [place] Standard Time. CONFIDENTIALTY NOTICE: This fax transmission is  intended only for the addressee. It contains information that is legally privileged, confidential or otherwise protected from use or disclosure. If you are not the intended recipient, you are strictly prohibited from reviewing, disclosing, copying using or disseminating any of this information or taking any action in reliance on or regarding this information. If you have received this fax in error, please notify us immediately by telephone so that we can arrange for its return to [place]. Phone: [phone], Toll-Free: [phone], Fax: [phone] Page: 2 of 2 Call Id: [id number] Guidelines Guideline Title Affirmed Question Affirmed Notes Nurse Date/Time Eilene Ghazi Time) new or worsening symptoms.) Disp. Time Eilene Ghazi Time) Disposition Final User 10/21/2020 9:07:54 AM Call PCP Now Yes Radford Pax, RN, Sharion Settler Disagree/Comply Comply Caller Understands Yes PreDisposition Call Doctor Care Advice Given Per Guideline CALL PCP NOW: * You need to discuss this with your doctor (or NP/PA). * I'll page the on-call provider now. If you haven't heard from the provider (or me) within 30 minutes, call again. CALL BACK IF: * You become worse CARE ADVICE given per COVID-19 - DIAGNOSED OR SUSPECTED (Adult) guideline. Comments User: Susie Cassette, RN Date/Time Eilene Ghazi Time): 10/21/2020 9:09:17 AM Per rep pt's nurse has already been sent a note regarding cough medication. Should get a call back shortly.

## 2020-10-21 NOTE — Progress Notes (Deleted)
    I connected with Phillip Nelson on 10/21/20 at 11:40 AM EDT by video and verified that I am speaking with the correct person using two identifiers.   I discussed the limitations, risks, security and privacy concerns of performing an evaluation and management service by video and the availability of in person appointments. I also discussed with the patient that there may be a patient responsible charge related to this service. The patient expressed understanding and agreed to proceed.  Patient location: Home Provider Location: [place] Participants: [name] and HARUTO RIES   Subjective:     Phillip Nelson is a 86 y.o. male presenting for Covid Positive (Onset of symptoms 10/20/20), Cough, and Nasal Congestion     Cough    Review of Systems  Respiratory:  Positive for cough.     Social History   Tobacco Use  Smoking Status Former   Packs/day: 3.00   Years: 31.00   Pack years: 93.00   Types: Cigarettes   Quit date: 08/01/1987   Years since quitting: 33.2  Smokeless Tobacco Never        Objective:   BP Readings from Last 3 Encounters:  10/07/20 130/72  09/30/20 118/72  11/26/19 140/86   Wt Readings from Last 3 Encounters:  10/07/20 223 lb 12 oz (101.5 kg)  09/30/20 221 lb 8 oz (100.5 kg)  11/26/19 225 lb 14.4 oz (102.5 kg)   There were no vitals taken for this visit.  Physical Exam Constitutional:      Appearance: Normal appearance. He is not ill-appearing.  HENT:     Head: Normocephalic and atraumatic.     Right Ear: External ear normal.     Left Ear: External ear normal.  Eyes:     Conjunctiva/sclera: Conjunctivae normal.  Pulmonary:     Effort: Pulmonary effort is normal. No respiratory distress.  Neurological:     Mental Status: He is alert. Mental status is at baseline.  Psychiatric:        Mood and Affect: Mood normal.        Behavior: Behavior normal.        Thought Content: Thought content normal.         Judgment: Judgment normal.            Assessment & Plan:   Problem List Items Addressed This Visit   None    No follow-ups on file.  [name], MD

## 2020-11-01 ENCOUNTER — Other Ambulatory Visit: Payer: Medicare Other

## 2020-11-08 ENCOUNTER — Telehealth: Payer: Self-pay

## 2020-11-08 NOTE — Telephone Encounter (Signed)
Noted agree with watch and wait

## 2020-11-08 NOTE — Telephone Encounter (Signed)
[facility] - Client TELEPHONE ADVICE RECORD [facility] Patient Name: Phillip Nelson Gender: Male DOB: July 06, 1934 Age: 86 Y 71 M 18 D Return Phone Number: [phone] (Primary), [phone] (Secondary) Address: City/ State/ Zip: [place] [address] Client [facility] - Client Client Site [place] - Day Physician [name] Contact Type Call Who Is Calling Patient / Member / Family / Caregiver Call Type Triage / Clinical Relationship To Patient Self Return Phone Number [phone] (Primary) Chief Complaint Leg Pain Reason for Call Symptomatic / Request for [name] states he found a tick on the back of his right leg. PT thinks head is still embedded. PT is having leg cramps. No appts avail. Translation No Nurse Assessment Nurse: Ronnald Ramp, RN, Miranda Date/Time (Eastern Time): 11/08/2020 10:59:49 AM Confirm and document reason for call. If symptomatic, describe symptoms. ---Caller states he removed a tick from the back of his right leg. He believes the head was still attached to the tick. He is having some cramping in his ankles. He also played gold yesterday. Does the patient have any new or worsening symptoms? ---Yes Will a triage be completed? ---Yes Related visit to physician within the last 2 weeks? ---No Does the PT have any chronic conditions? (i.e. diabetes, asthma, this includes High risk factors for pregnancy, etc.) ---Yes List chronic conditions. ---Gout, Potassium Is this a behavioral health or substance abuse call? ---No Guidelines Guideline Title Affirmed Question Affirmed Notes Nurse Date/Time (Eastern Time) Tick Bite Wood tick bite with no complications Ronnald Ramp, RN, Miranda 11/08/2020 11:03:16 AM Ankle Pain Caused by overuse from recent vigorous activity (e.g., vigorous activity, running) Ronnald Ramp, RN, Miranda 11/08/2020  11:09:22 AM PLEASE NOTE: All timestamps contained within this report are represented as [place] Standard Time. CONFIDENTIALTY NOTICE: This fax transmission is intended only for the addressee. It contains information that is legally privileged, confidential or otherwise protected from use or disclosure. If you are not the intended recipient, you are strictly prohibited from reviewing, disclosing, copying using or disseminating any of this information or taking any action in reliance on or regarding this information. If you have received this fax in error, please notify us immediately by telephone so that we can arrange for its return to [place]. Phone: [phone], Toll-Free: [phone], Fax: [phone] Page: 2 of 2 Call Id: [id number] [place]. Time Eilene Ghazi Time) Disposition Final User 11/08/2020 11:08:54 AM Home Care Ronnald Ramp, RN, Miranda 11/08/2020 11:21:59 AM Home Care Yes Ronnald Ramp, RN, Miranda Caller Disagree/Comply Comply Caller Understands Yes PreDisposition Call Doctor Care Advice Given Per Guideline HOME CARE: * You should be able to treat this at home. REASSURANCE AND EDUCATION - WOOD TICK BITE: * Most tick bites are harmless and can be treated at home. * The spread of disease by ticks is not common. * Here is some care advice that should help. CALL BACK IF: * Fever or rash occur in the next 4 weeks * Bite begins to look infected * You become worse CARE ADVICE given per Tick Bites (Adult) guideline. HOME CARE: * You should be able to treat this at home. USE A COLD PACK FOR PAIN: * Put a cold pack or an ice bag (wrapped in a moist towel) on the area for 20 minutes. USE HEAT AFTER 48 HOURS FOR PAIN: * If pain lasts over 48 hours (2 days), put heat on the sore area. CALL BACK IF: * Pain persists over 7 days * You become  worse CARE ADVICE given per Ankle Pain (Adult) guideline. Comments User: Leverne Humbles, RN Date/Time Eilene Ghazi Time): 11/08/2020 11:11:33 AM PT would like to have a test for Lyme  disease added to the labs he has schedule for Wed morning. Please contact him to let him know if you are going to do this or not. Thanks.

## 2020-11-10 ENCOUNTER — Other Ambulatory Visit: Payer: Self-pay

## 2020-11-10 ENCOUNTER — Telehealth (INDEPENDENT_AMBULATORY_CARE_PROVIDER_SITE_OTHER): Payer: Medicare Other | Admitting: Family Medicine

## 2020-11-10 ENCOUNTER — Encounter: Payer: Self-pay | Admitting: Family Medicine

## 2020-11-10 ENCOUNTER — Other Ambulatory Visit (INDEPENDENT_AMBULATORY_CARE_PROVIDER_SITE_OTHER): Payer: Medicare Other

## 2020-11-10 DIAGNOSIS — S70369A Insect bite (nonvenomous), unspecified thigh, initial encounter: Secondary | ICD-10-CM

## 2020-11-10 DIAGNOSIS — W57XXXA Bitten or stung by nonvenomous insect and other nonvenomous arthropods, initial encounter: Secondary | ICD-10-CM | POA: Diagnosis not present

## 2020-11-10 DIAGNOSIS — M1A9XX0 Chronic gout, unspecified, without tophus (tophi): Secondary | ICD-10-CM | POA: Diagnosis not present

## 2020-11-10 LAB — URIC ACID: Uric Acid, Serum: 6.5 mg/dL (ref 4.0–7.8)

## 2020-11-10 NOTE — Progress Notes (Signed)
Virtual Visit via Telephone Note  I connected with Phillip Nelson on 11/10/20 at 10:40 AM EDT by telephone and verified that I am speaking with the correct person using two identifiers.   I discussed the limitations, risks, security and privacy concerns of performing an evaluation and management service by telephone and the availability of in person appointments. I also discussed with the patient that there may be a patient responsible charge related to this service. The patient expressed understanding and agreed to proceed.  Patient location: Home Provider Location: [place] Participants: [name] and Phillip Nelson   History of Present Illness: Chief Complaint  Patient presents with   Insect Bite    Tick on back on R leg. Removed on Sunday. No bullseye or fever.     HPI  #Tick bite - found the tick on 8/14 - in a position he could not see - but could feel the tick so he reach back and could remove the tick. Was not fat and low suspicion that he was there for a long time - no rashes or lesions - has been checking - removed the tick within a few hours  - did have bleed at the site   Review of Systems  Constitutional:  Negative for chills and malaise/fatigue.  Cardiovascular:  Positive for claudication.  Musculoskeletal:  Negative for myalgias and neck pain.     Observations/Objective: There were no vitals taken for this visit.  Phone visit:  Patient speaking in complete sentences No distress Alert and oriented Normal mood  Assessment and Plan: Problem List Items Addressed This Visit   None Visit Diagnoses     Tick bite of thigh, unspecified laterality, initial encounter    -  Primary      Advised that given hx no need for lymes disease testing today.   Call if fever, chills, body aches, new lesions  Follow Up Instructions:  Return if symptoms worsen or fail to improve.   I discussed the assessment and treatment plan with the  patient. The patient was provided an opportunity to ask questions and all were answered. The patient agreed with the plan and demonstrated an understanding of the instructions.   The patient was advised to call back or seek an in-person evaluation if the symptoms worsen or if the condition fails to improve as anticipated.  I provided 6 minutes of non-face-to-face time during this encounter.   [name], MD

## 2020-11-18 DIAGNOSIS — I25118 Atherosclerotic heart disease of native coronary artery with other forms of angina pectoris: Secondary | ICD-10-CM | POA: Diagnosis not present

## 2020-11-18 DIAGNOSIS — E782 Mixed hyperlipidemia: Secondary | ICD-10-CM | POA: Diagnosis not present

## 2020-11-18 DIAGNOSIS — I10 Essential (primary) hypertension: Secondary | ICD-10-CM | POA: Diagnosis not present

## 2020-11-18 DIAGNOSIS — I208 Other forms of angina pectoris: Secondary | ICD-10-CM | POA: Diagnosis not present

## 2020-11-18 DIAGNOSIS — I6523 Occlusion and stenosis of bilateral carotid arteries: Secondary | ICD-10-CM | POA: Insufficient documentation

## 2020-11-18 DIAGNOSIS — R9431 Abnormal electrocardiogram [ECG] [EKG]: Secondary | ICD-10-CM | POA: Diagnosis not present

## 2020-12-08 ENCOUNTER — Encounter: Payer: Self-pay | Admitting: Family Medicine

## 2020-12-08 ENCOUNTER — Telehealth (INDEPENDENT_AMBULATORY_CARE_PROVIDER_SITE_OTHER): Payer: Medicare Other | Admitting: Family Medicine

## 2020-12-08 DIAGNOSIS — J069 Acute upper respiratory infection, unspecified: Secondary | ICD-10-CM

## 2020-12-08 MED ORDER — BENZONATATE 100 MG PO CAPS: 100.0000 mg | ORAL_CAPSULE | Freq: Three times a day (TID) | ORAL | 0 refills | Status: DC | PRN

## 2020-12-08 NOTE — Progress Notes (Signed)
Virtual Visit via Telephone Note  I connected with Phillip Nelson on 12/08/20 at  8:40 AM EDT by telephone and verified that I am speaking with the correct person using two identifiers.   I discussed the limitations, risks, security and privacy concerns of performing an evaluation and management service by telephone and the availability of in person appointments. I also discussed with the patient that there may be a patient responsible charge related to this service. The patient expressed understanding and agreed to proceed.  Patient location: Home Provider Location: [place] Participants: [name] and Phillip Nelson   History of Present Illness: Chief Complaint  Patient presents with   Nasal Congestion    & sneezing   Cough    Light yellow discharge    Cough This is a new problem. The current episode started yesterday. The cough is Productive of sputum. Associated symptoms include headaches and nasal congestion. Pertinent negatives include no chest pain, chills, ear pain, fever, myalgias, sore throat, shortness of breath or wheezing. Nothing aggravates the symptoms.   Wife Blanch Media has been sick for 1 week - was prescribed a azithromycin Covid last month  No loss of taste No n/v/diarrhea  Review of Systems  Constitutional:  Negative for chills and fever.  HENT:  Positive for congestion. Negative for ear pain, sinus pain and sore throat.        Sneezing  Respiratory:  Positive for cough. Negative for shortness of breath and wheezing.   Cardiovascular:  Negative for chest pain.  Musculoskeletal:  Negative for myalgias.  Neurological:  Positive for headaches.     Observations/Objective: There were no vitals taken for this visit.  Phone visit:  Patient speaking in complete sentences No distress Alert and oriented Normal mood  Assessment and Plan: Problem List Items Addressed This Visit   None Visit Diagnoses     Viral URI with cough    -   Primary      Discussed likely viral.   He will take home covid test - last positive was October 21, 2020.  If positive may due Molunipavir  Tessalon for cough If worsening consider antibiotics  Follow Up Instructions:  Return if symptoms worsen or fail to improve.   I discussed the assessment and treatment plan with the patient. The patient was provided an opportunity to ask questions and all were answered. The patient agreed with the plan and demonstrated an understanding of the instructions.   The patient was advised to call back or seek an in-person evaluation if the symptoms worsen or if the condition fails to improve as anticipated.  I provided 9 minutes of non-face-to-face time during this encounter.   [name], MD

## 2020-12-13 ENCOUNTER — Telehealth: Payer: Self-pay | Admitting: Family Medicine

## 2020-12-13 DIAGNOSIS — R059 Cough, unspecified: Secondary | ICD-10-CM

## 2020-12-13 MED ORDER — AZITHROMYCIN 250 MG PO TABS: ORAL_TABLET | ORAL | 0 refills | Status: DC

## 2020-12-13 NOTE — Telephone Encounter (Signed)
Pt called in with update. His covid test came back negative. He states he is not feeling any better and he is requesting an antibiotic be sent in to [facility] in [place]. He is requesting this as soon as possible.

## 2020-12-13 NOTE — Telephone Encounter (Signed)
Notified pt of antibiotics sent in and to please update [place] if not feeling any better this week.

## 2020-12-13 NOTE — Telephone Encounter (Signed)
Antibiotics sent to pharmacy.   Please update if not improving over next 2 days

## 2020-12-16 NOTE — Telephone Encounter (Signed)
Pt called in to say Thank you and that he's feeling much better

## 2021-01-11 DIAGNOSIS — L821 Other seborrheic keratosis: Secondary | ICD-10-CM | POA: Diagnosis not present

## 2021-01-11 DIAGNOSIS — Z85828 Personal history of other malignant neoplasm of skin: Secondary | ICD-10-CM | POA: Diagnosis not present

## 2021-01-11 DIAGNOSIS — D485 Neoplasm of uncertain behavior of skin: Secondary | ICD-10-CM | POA: Diagnosis not present

## 2021-01-11 DIAGNOSIS — L570 Actinic keratosis: Secondary | ICD-10-CM | POA: Diagnosis not present

## 2021-01-11 DIAGNOSIS — L820 Inflamed seborrheic keratosis: Secondary | ICD-10-CM | POA: Diagnosis not present

## 2021-01-11 DIAGNOSIS — C4441 Basal cell carcinoma of skin of scalp and neck: Secondary | ICD-10-CM | POA: Diagnosis not present

## 2021-01-11 DIAGNOSIS — C44612 Basal cell carcinoma of skin of right upper limb, including shoulder: Secondary | ICD-10-CM | POA: Diagnosis not present

## 2021-01-14 DIAGNOSIS — Z23 Encounter for immunization: Secondary | ICD-10-CM | POA: Diagnosis not present

## 2021-03-24 ENCOUNTER — Telehealth: Payer: Medicare Other | Admitting: Nurse Practitioner

## 2021-04-12 DIAGNOSIS — L570 Actinic keratosis: Secondary | ICD-10-CM | POA: Diagnosis not present

## 2021-04-12 DIAGNOSIS — Z85828 Personal history of other malignant neoplasm of skin: Secondary | ICD-10-CM | POA: Diagnosis not present

## 2021-04-12 DIAGNOSIS — C44619 Basal cell carcinoma of skin of left upper limb, including shoulder: Secondary | ICD-10-CM | POA: Diagnosis not present

## 2021-04-12 DIAGNOSIS — L738 Other specified follicular disorders: Secondary | ICD-10-CM | POA: Diagnosis not present

## 2021-04-12 DIAGNOSIS — L821 Other seborrheic keratosis: Secondary | ICD-10-CM | POA: Diagnosis not present

## 2021-04-19 ENCOUNTER — Encounter: Payer: Self-pay | Admitting: Oncology

## 2021-05-12 DIAGNOSIS — M75112 Incomplete rotator cuff tear or rupture of left shoulder, not specified as traumatic: Secondary | ICD-10-CM | POA: Diagnosis not present

## 2021-05-12 DIAGNOSIS — M25512 Pain in left shoulder: Secondary | ICD-10-CM | POA: Diagnosis not present

## 2021-06-07 DIAGNOSIS — M25522 Pain in left elbow: Secondary | ICD-10-CM | POA: Diagnosis not present

## 2021-07-12 ENCOUNTER — Ambulatory Visit (INDEPENDENT_AMBULATORY_CARE_PROVIDER_SITE_OTHER): Payer: Medicare Other | Admitting: Family Medicine

## 2021-07-12 VITALS — BP 124/60 | HR 95 | Temp 97.8°F | Ht 69.75 in | Wt 220.0 lb

## 2021-07-12 DIAGNOSIS — R0609 Other forms of dyspnea: Secondary | ICD-10-CM | POA: Insufficient documentation

## 2021-07-12 DIAGNOSIS — I208 Other forms of angina pectoris: Secondary | ICD-10-CM

## 2021-07-12 DIAGNOSIS — E782 Mixed hyperlipidemia: Secondary | ICD-10-CM

## 2021-07-12 DIAGNOSIS — M1A9XX0 Chronic gout, unspecified, without tophus (tophi): Secondary | ICD-10-CM

## 2021-07-12 DIAGNOSIS — Z8546 Personal history of malignant neoplasm of prostate: Secondary | ICD-10-CM | POA: Diagnosis not present

## 2021-07-12 DIAGNOSIS — R7303 Prediabetes: Secondary | ICD-10-CM | POA: Diagnosis not present

## 2021-07-12 DIAGNOSIS — I440 Atrioventricular block, first degree: Secondary | ICD-10-CM

## 2021-07-12 HISTORY — DX: Other forms of dyspnea: R06.09

## 2021-07-12 LAB — CBC WITH DIFFERENTIAL/PLATELET
Basophils Absolute: 0 10*3/uL (ref 0.0–0.1)
Basophils Relative: 0.5 % (ref 0.0–3.0)
Eosinophils Absolute: 0.1 10*3/uL (ref 0.0–0.7)
Eosinophils Relative: 1.1 % (ref 0.0–5.0)
HCT: 32.5 % — ABNORMAL LOW (ref 39.0–52.0)
Hemoglobin: 10.3 g/dL — ABNORMAL LOW (ref 13.0–17.0)
Lymphocytes Relative: 21.7 % (ref 12.0–46.0)
Lymphs Abs: 2.3 10*3/uL (ref 0.7–4.0)
MCHC: 31.6 g/dL (ref 30.0–36.0)
MCV: 82.1 fl (ref 78.0–100.0)
Monocytes Absolute: 1 10*3/uL (ref 0.1–1.0)
Monocytes Relative: 8.9 % (ref 3.0–12.0)
Neutro Abs: 7.3 10*3/uL (ref 1.4–7.7)
Neutrophils Relative %: 67.8 % (ref 43.0–77.0)
Platelets: 420 10*3/uL — ABNORMAL HIGH (ref 150.0–400.0)
RBC: 3.96 Mil/uL — ABNORMAL LOW (ref 4.22–5.81)
RDW: 16.4 % — ABNORMAL HIGH (ref 11.5–15.5)
WBC: 10.7 10*3/uL — ABNORMAL HIGH (ref 4.0–10.5)

## 2021-07-12 LAB — COMPREHENSIVE METABOLIC PANEL
ALT: 18 U/L (ref 0–53)
AST: 21 U/L (ref 0–37)
Albumin: 4.2 g/dL (ref 3.5–5.2)
Alkaline Phosphatase: 66 U/L (ref 39–117)
BUN: 20 mg/dL (ref 6–23)
CO2: 26 mEq/L (ref 19–32)
Calcium: 9.1 mg/dL (ref 8.4–10.5)
Chloride: 104 mEq/L (ref 96–112)
Creatinine, Ser: 1.09 mg/dL (ref 0.40–1.50)
GFR: 62.38 mL/min (ref >60.00)
Glucose, Bld: 125 mg/dL — ABNORMAL HIGH (ref 70–99)
Potassium: 4 mEq/L (ref 3.5–5.1)
Sodium: 138 mEq/L (ref 135–145)
Total Bilirubin: 0.6 mg/dL (ref 0.2–1.2)
Total Protein: 6.8 g/dL (ref 6.0–8.3)

## 2021-07-12 LAB — PSA: PSA: 0 ng/mL — ABNORMAL LOW (ref 0.10–4.00)

## 2021-07-12 LAB — HEMOGLOBIN A1C: Hgb A1c MFr Bld: 6.3 % (ref 4.6–6.5)

## 2021-07-12 LAB — LIPID PANEL
Cholesterol: 179 mg/dL (ref 0–200)
HDL: 59.7 mg/dL (ref >39.00)
LDL Cholesterol: 92 mg/dL (ref 0–99)
NonHDL: 119.37
Total CHOL/HDL Ratio: 3
Triglycerides: 137 mg/dL (ref 0.0–149.0)
VLDL: 27.4 mg/dL (ref 0.0–40.0)

## 2021-07-12 LAB — BRAIN NATRIURETIC PEPTIDE: Pro B Natriuretic peptide (BNP): 36 pg/mL (ref 0.0–100.0)

## 2021-07-12 MED ORDER — ALLOPURINOL 300 MG PO TABS: 300.0000 mg | ORAL_TABLET | Freq: Every day | ORAL | 3 refills | Status: DC

## 2021-07-12 MED ORDER — NITROGLYCERIN 0.4 MG SL SUBL: 0.4000 mg | SUBLINGUAL_TABLET | SUBLINGUAL | 0 refills | Status: DC | PRN

## 2021-07-12 NOTE — Assessment & Plan Note (Signed)
No recent flares, continue allopurinol 300 mg. ?

## 2021-07-12 NOTE — Assessment & Plan Note (Addendum)
Etiology unclear, however with PND noted in history.  May be related to his untreated sleep apnea, however will evaluate for heart failure.  EKG with new first-degree block, given ongoing and progressive symptoms associated with chest pain will refer for urgent to cardiology.  Labs today. ER Precautions if worsening. ?

## 2021-07-12 NOTE — Patient Instructions (Signed)
Chest pain ?- if worsening - try nitroglycerine ?- go to the ER if it does not resolve ? ?Breathing ?- labs today ? ?I placed an urgent referral to cardiology - you will get a phone call ?

## 2021-07-12 NOTE — Assessment & Plan Note (Signed)
History in chart, concerned this is what patient could be having with exertional chest pressure.  Nitroglycerin prescribed due to recurrence symptoms.  ER precautions ?

## 2021-07-12 NOTE — Assessment & Plan Note (Signed)
Continue healthy diet, repeat hemoglobin A1c today. ?

## 2021-07-12 NOTE — Assessment & Plan Note (Signed)
Intolerant of statins.  Recheck today. ?

## 2021-07-12 NOTE — Progress Notes (Signed)
? ?Subjective:  ? ?  ?Phillip Nelson is a 87 y.o. male presenting for Shortness of Breath (Upon exertion x 6 months ) and Chest Pain (With the SOB and exertion ) ?  ? ? ?Shortness of Breath ?This is a chronic problem. The current episode started more than 1 month ago (6 months). The problem has been gradually worsening. The average episode lasts 5 minutes. Associated symptoms include chest pain, leg swelling (feet) and PND. Pertinent negatives include no abdominal pain, claudication, fever, orthopnea (2 pillows, stable), vomiting or wheezing. He has tried rest for the symptoms.  ?Chest Pain  ?This is a chronic problem. The current episode started more than 1 month ago. The problem has been unchanged. The pain is present in the lateral region (right side). The pain is at a severity of 3/10. The quality of the pain is described as stabbing. The pain does not radiate. Associated symptoms include dizziness, exertional chest pressure, PND and shortness of breath. Pertinent negatives include no abdominal pain, claudication, fever, irregular heartbeat, nausea, orthopnea (2 pillows, stable), palpitations or vomiting. The pain is aggravated by exertion. He has tried rest for the symptoms.  ? ?Has sleep apnea - does not use cpap - told he needs full face covering ? ?Review of Systems  ?Constitutional:  Negative for fever.  ?Respiratory:  Positive for shortness of breath. Negative for wheezing.   ?Cardiovascular:  Positive for chest pain, leg swelling (feet) and PND. Negative for palpitations, orthopnea (2 pillows, stable) and claudication.  ?Gastrointestinal:  Negative for abdominal pain, nausea and vomiting.  ?Neurological:  Positive for dizziness.  ? ? ?Social History  ? ?Tobacco Use  ?Smoking Status Former  ? Packs/day: 3.00  ? Years: 31.00  ? Pack years: 93.00  ? Types: Cigarettes  ? Quit date: 08/01/1987  ? Years since quitting: 33.9  ?Smokeless Tobacco Never  ? ? ? ?   ?Objective:  ?  ?BP Readings from Last 3  Encounters:  ?07/12/21 124/60  ?10/07/20 130/72  ?09/30/20 118/72  ? ?Wt Readings from Last 3 Encounters:  ?07/12/21 220 lb (99.8 kg)  ?10/07/20 223 lb 12 oz (101.5 kg)  ?09/30/20 221 lb 8 oz (100.5 kg)  ? ? ?BP 124/60   Pulse 95   Temp 97.8 ?F (36.6 ?C) (Oral)   Ht 5' 9.75" (1.772 m)   Wt 220 lb (99.8 kg)   SpO2 97%   BMI 31.79 kg/m?  ? ? ?Physical Exam ?Constitutional:   ?   Appearance: Normal appearance. He is not ill-appearing or diaphoretic.  ?HENT:  ?   Right Ear: External ear normal.  ?   Left Ear: External ear normal.  ?   Nose: Nose normal.  ?Eyes:  ?   General: No scleral icterus. ?   Extraocular Movements: Extraocular movements intact.  ?   Conjunctiva/sclera: Conjunctivae normal.  ?Neck:  ?   Vascular: Hepatojugular reflux and JVD present.  ?Cardiovascular:  ?   Rate and Rhythm: Normal rate and regular rhythm.  ?   Heart sounds: Murmur heard.  ?Pulmonary:  ?   Effort: Pulmonary effort is normal. No respiratory distress.  ?   Breath sounds: Normal breath sounds. No wheezing.  ?Musculoskeletal:  ?   Cervical back: Neck supple.  ?   Right lower leg: No edema.  ?   Left lower leg: No edema.  ?Skin: ?   General: Skin is warm and dry.  ?Neurological:  ?   Mental Status: He is alert. Mental status  is at baseline.  ?Psychiatric:     ?   Mood and Affect: Mood normal.  ? ? ?EKG: first degree av block, right bundle branch block, NSR, no st changes ? ? ?   ?Assessment & Plan:  ? ?Problem List Items Addressed This Visit   ? ?  ? Cardiovascular and Mediastinum  ? Stable angina pectoris ([place])  ?  History in chart, concerned this is what patient could be having with exertional chest pressure.  Nitroglycerin prescribed due to recurrence symptoms.  ER precautions ? ?  ?  ? Relevant Medications  ? nitroGLYCERIN (NITROSTAT) 0.4 MG SL tablet  ?  ? Other  ? History of prostate cancer  ? Relevant Orders  ? PSA  ? Hyperlipemia, mixed  ?  Intolerant of statins.  Recheck today. ? ?  ?  ? Relevant Medications  ?  nitroGLYCERIN (NITROSTAT) 0.4 MG SL tablet  ? Other Relevant Orders  ? Comprehensive metabolic panel  ? Lipid panel  ? Prediabetes  ?  Continue healthy diet, repeat hemoglobin A1c today. ? ?  ?  ? Relevant Orders  ? Hemoglobin A1c  ? Gout  ?  No recent flares, continue allopurinol 300 mg. ? ?  ?  ? Relevant Medications  ? allopurinol (ZYLOPRIM) 300 MG tablet  ? Dyspnea on exertion - Primary  ?  Etiology unclear, however with PND noted in history.  May be related to his untreated sleep apnea, however will evaluate for heart failure.  EKG with new first-degree block, given ongoing and progressive symptoms associated with chest pain will refer for urgent to cardiology.  Labs today. ER Precautions if worsening. ? ?  ?  ? Relevant Orders  ? CBC with Differential  ? Brain natriuretic peptide  ? EKG 12-Lead (Completed)  ? Ambulatory referral to Cardiology  ? ?Other Visit Diagnoses   ? ? First degree AV block      ? Relevant Medications  ? nitroGLYCERIN (NITROSTAT) 0.4 MG SL tablet  ? Other Relevant Orders  ? Ambulatory referral to Cardiology  ? ?  ? ? ? ?No follow-ups on file. ? ?[name], MD ? ? ? ?

## 2021-07-13 ENCOUNTER — Encounter: Payer: Self-pay | Admitting: Cardiology

## 2021-07-13 ENCOUNTER — Other Ambulatory Visit (INDEPENDENT_AMBULATORY_CARE_PROVIDER_SITE_OTHER): Payer: Medicare Other

## 2021-07-13 DIAGNOSIS — D649 Anemia, unspecified: Secondary | ICD-10-CM | POA: Diagnosis not present

## 2021-07-13 LAB — FERRITIN: Ferritin: 10.4 ng/mL — ABNORMAL LOW (ref 22.0–322.0)

## 2021-07-13 NOTE — Progress Notes (Signed)
? ? ?Primary Care Provider: [name], MD ?[facility] Cardiologist: None  ?-> Serafina Royals, MD; [facility]   ?Electrophysiologist: None ? ?Clinic Note: ?Chief Complaint  ?Patient presents with  ? New Patient (Initial Visit)  ?  SOB/ AV block. Meds reviewed verbally with pt. ?- Transition of [facility] Care to [facility] (new PCP recommends [facility] for ease of communitcation  ? Coronary Artery Disease  ?  CABG in 2005 - no "true angina" since.   ? ?=================================== ? ?ASSESSMENT/PLAN  ? ?Problem List Items Addressed This Visit   ? ?  ? Cardiology Problems  ? Coronary artery disease involving native coronary artery of native heart without angina pectoris - Primary (Chronic)  ?  Doing fairly well.  He has mild off-and-on symptoms that are not consistent with angina, but is noticing some dyspnea with playing golf. ? ?He is 5 years out from his last stress test and therefore is due for follow-up Myoview soon, but would like to see echocardiogram first based on his dyspnea.. ? ?Plan: ?Follow-up echocardiogram -> if normal EF, would then plan on follow-up Myoview afterwards.  Otherwise, may need to consider more invasive evaluation. ?Continue aspirin as this is the only medication he is taking.  He does not like to take medicines.  We will gradually need to determine what medications he would be willing to take.  Thankfully, his BP is stable. ?Also would like to see his lipids better controlled than an LDL of 92 => we will broach topic of treatment options next visit. ?  ?  ? Hyperlipidemia with target LDL less than 70 (Chronic)  ?  Per report, intolerant of statins.  He does have CAD and we therefore need to push his lipids down.  He is reluctant to take medications.  Again, consider the possibility of PCSK9 better or inclisiran if we can prove true statin intolerance based on history.  Unfortunately do not have notes to suggest which medicines he has tried.  We may need to  redocument. ? ?  ?  ?  ? Other  ? History of heart attack (Chronic)  ?  Non-STEMI back in 2005.  No ischemia or infarction noted on echocardiogram or Myoview. ? ?Had CABG bypassing the LAD.  No real significant symptoms since.  Just some twitching pain off and on. ? ?  ?  ? Relevant Orders  ? EKG 12-Lead (Completed)  ? History of lower GI bleeding (Chronic)  ?  He just had blood counts drawn and showed that he remains anemic.  This seems to be a somewhat chronic issue. ? ?This could be explaining his dyspnea. ?We will check an ischemia panel.  I suspect he probably needs iron supplementation, he is not low enough to require transfusion. ? ? ?  ?  ? Prediabetes  ?  Hemoglobin A1c is 6.3.  Not currently medications.  Plan is to continue lifestyle modification. ? ?  ?  ? Anemia  ? Relevant Orders  ? Ferritin  ? Folate  ? Iron and TIBC  ? Vitamin B12  ? OSA (obstructive sleep apnea) (Chronic)  ?  He has sleep apnea, diagnosed with severe sleep apnea back in 2017, but refuses to use CPAP. ?If he is not having issues, I cannot convince him to do anything significant. ? ?  ?  ? Myalgia due to statin (Chronic)  ?  It appears that in the past he has tried different statins, but has been intolerant of them.  Noting myalgias.  I do not know of how much of it is myalgias versus the fact that he just does not like taking pills. ? ?Perhaps if we can confirm true myalgias with statin, he could potentially qualify for inclisiran for PCSK9 inhibitor.  Inclisiran would be helpful because it would ensure adherence. ? ?  ?  ? DOE (dyspnea on exertion) (Chronic)  ?  Interesting that he does have some exertional dyspnea but no PND or orthopnea.  No edema.  Could be related to sleep apnea, does not associate to be CHF, however he has not had echo checked in a while.  Plan was to check an echo and Myoview last year that did get checked. ? ?While he has anemia, I will to see what his anemia panel looks like before consider Myoview  evaluation.  We will start with echocardiogram.  Based on those results we may proceed with Myoview versus potentially go directly to Coronary CTA or actually cardiac catheterization.  I would be leery of PCI with his anemia and penchant to avoid medications. ? ?  ?  ? Relevant Orders  ? EKG 12-Lead (Completed)  ? ECHOCARDIOGRAM COMPLETE  ? ? ?=================================== ? ?HPI:   ? ?Phillip Nelson is a 87 y.o. male with a history of non-STEMI (one-vessel CAD-CABG times 25 September 2003), HTN, HLD, OSA (not diligent with CPAP) bilateral Carotid Stenosis who is being seen today to establish Cardiology care with [facility] at the request of [name], MD. ? ?Phillip Nelson was last seen by Dr. Nehemiah Massed on 11/18/2020 (after 4-year hiatus).   ?=> In May 2018 he was seen in follow-up (echo and stress test) after complaints of  5 months episode of sharp left-sided chest pain at rest lasting less than a minute and radiating to the back-thought to be noncardiac.  At that time he was on beta-blocker, aspirin and statin =>  ?=> August 2022:  patient with known coronary disease only on aspirin.  Noted acute chest pain that was retrosternal, dull and heavy with no radiation.  Occurring randomly.  Relieved by position change and rest.  Associated with dyspnea lasting 10 minutes.  [place] by report was worsening over the last month.  (Note says [place] Class III Angina-however assessment says stable angina pectoris) ?Echocardiogram and Exercise Cardiolite ordered for shortness of breath and angina, carotid Dopplers (studies were not done) ?No medications for blood pressure or cholesterol-note mentioned "consider statin for further risk reduction. ? ?Recent Hospitalizations: none ? ?Reviewed  CV studies:   ? ?The following studies were reviewed today: (if available, images/films reviewed: From Epic Chart or Care Everywhere) ?Cardiac cath 10/19/2003: [facility] (non-STEMI): Ostial LAD 95% with multiple sequential 50% stenoses.  => Referred for CABG ?Angina Sx was "heaviness in chest - like 100 lb block on his chest, no real SOB.  ?Occurred while playing Golf. ?NO Sx like that since ? ?CABGx1 LIMA-LAD 10/19/2003: [facility], Gretchen Portela, MD  ? ?Echocardiogram 07/31/2016: [facility]) normal LV function.  Mild LVH.  EF 50%.  Normal RV.  Mild MR, TR and PR.  No stenosis. ?Treadmill Myoview/Cardiolite 07/31/2016: [facility]): 4.6 METS, 92 % MPHR.  EF 55 to 60%.  Normal wall motion.  No ischemia or infarction. ? ?Interval History:  ? ?Phillip Nelson presents here today to establish cardiology care.  Apparently his new PCP is part of the [place] and had recommended he switch to the [place]. ?He says that overall he  is just a lot less active than he used to be.  He is the caregiver for his wife who recently had a stroke and has dementia.  As such she is to stay around a lot with her.  He used to walk routinely with her until she had her stroke.  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  He does not necessarily get short of breath with this type of activity. ?He does note off-and-on stinging chest discomfort that is in the upper part of his chest.  It does not seem to be similar to his anginal discomfort which was a heavy brick on his chest with a squeezing sensation.  It is not associated with dyspnea. ?He also has some worsening exertional dyspnea especially with playing golf.  He sometimes wakes up in the morning little short of breath with a cough..  Denies any orthopnea though and no edema.  He thinks his waking up in melanite is probably because of sleep apnea. ? ?He has a morning cough worse in the morning.  But sometimes happens at night when he lies down. ? ?CV Review of Symptoms (Summary) ?Cardiovascular ROS: positive for - chest pain, dyspnea on exertion, and generally less active-deconditioned.  Chest pain is sharp nagging symptoms not consistent with  prior MI- like anginal pain; notes shortness of breath with lying down flat cough, but once he is flat he feels okay.  No real orthopnea. ?negative for - edema, irregular heartbeat, palpitations, paroxysmal nocturnal dyspnea

## 2021-07-14 ENCOUNTER — Other Ambulatory Visit: Payer: Self-pay | Admitting: Family Medicine

## 2021-07-14 ENCOUNTER — Ambulatory Visit (INDEPENDENT_AMBULATORY_CARE_PROVIDER_SITE_OTHER): Payer: Medicare Other | Admitting: Cardiology

## 2021-07-14 ENCOUNTER — Encounter: Payer: Self-pay | Admitting: Cardiology

## 2021-07-14 VITALS — BP 128/68 | HR 82 | Ht 70.0 in | Wt 221.0 lb

## 2021-07-14 DIAGNOSIS — R7303 Prediabetes: Secondary | ICD-10-CM

## 2021-07-14 DIAGNOSIS — T466X5A Adverse effect of antihyperlipidemic and antiarteriosclerotic drugs, initial encounter: Secondary | ICD-10-CM | POA: Diagnosis not present

## 2021-07-14 DIAGNOSIS — M791 Myalgia, unspecified site: Secondary | ICD-10-CM

## 2021-07-14 DIAGNOSIS — E785 Hyperlipidemia, unspecified: Secondary | ICD-10-CM

## 2021-07-14 DIAGNOSIS — G4733 Obstructive sleep apnea (adult) (pediatric): Secondary | ICD-10-CM

## 2021-07-14 DIAGNOSIS — I252 Old myocardial infarction: Secondary | ICD-10-CM

## 2021-07-14 DIAGNOSIS — R0609 Other forms of dyspnea: Secondary | ICD-10-CM

## 2021-07-14 DIAGNOSIS — D649 Anemia, unspecified: Secondary | ICD-10-CM | POA: Diagnosis not present

## 2021-07-14 DIAGNOSIS — I2510 Atherosclerotic heart disease of native coronary artery without angina pectoris: Secondary | ICD-10-CM | POA: Diagnosis not present

## 2021-07-14 DIAGNOSIS — D508 Other iron deficiency anemias: Secondary | ICD-10-CM

## 2021-07-14 DIAGNOSIS — Z8719 Personal history of other diseases of the digestive system: Secondary | ICD-10-CM | POA: Diagnosis not present

## 2021-07-14 NOTE — Patient Instructions (Addendum)
Medication Instructions:  ?Your physician recommends that you continue on your current medications as directed. Please refer to the Current Medication list given to you today. ? ?*If you need a refill on your cardiac medications before your next appointment, please call your pharmacy* ? ? ?Lab Work: ? ?An anemia panel will need to be drawn, your orders have been sent to the medical mall. ? ?- Please go to the [facility]. You will check in at the front desk to the right as you walk into the atrium. Valet Parking is offered if needed. ?- No appointment needed. You may go any day between 7 am and 6 pm.  ? ? ? ? ?Testing/Procedures: ? ?Your physician has requested that you have an echocardiogram. Echocardiography is a painless test that uses sound waves to create images of your heart. It provides your doctor with information about the size and shape of your heart and how well your heart?s chambers and valves are working. This procedure takes approximately one hour. There are no restrictions for this procedure. ? ? ?Follow-Up: ?At [facility], you and your health needs are our priority.  As part of our continuing mission to provide you with exceptional heart care, we have created designated Provider Care Teams.  These Care Teams include your primary Cardiologist (physician) and Advanced Practice Providers (APPs -  Physician Assistants and Nurse Practitioners) who all work together to provide you with the care you need, when you need it. ? ?We recommend signing up for the patient portal called "MyChart".  Sign up information is provided on this After Visit Summary.  MyChart is used to connect with patients for Virtual Visits (Telemedicine).  Patients are able to view lab/test results, encounter notes, upcoming appointments, etc.  Non-urgent messages can be sent to your provider as well.   ?To learn more about what you can do with MyChart, go to [email or web address].   ? ?Your next appointment:   ?Follow up  after Echo  ? ?The format for your next appointment:   ?In Person ? ?Provider:   ?You may see Glenetta Hew, MD or one of the following Advanced Practice Providers on your designated Care Team:   ?Murray Hodgkins, NP ?Christell Faith, PA-C ?Cadence Kathlen Mody, PA-C  ? ? ?Other Instructions ? ? ?Important Information About Sugar ? ? ? ? ? ? ?

## 2021-07-17 ENCOUNTER — Encounter: Payer: Self-pay | Admitting: Cardiology

## 2021-07-17 NOTE — Assessment & Plan Note (Signed)
Hemoglobin A1c is 6.3.  Not currently medications.  Plan is to continue lifestyle modification. ?

## 2021-07-17 NOTE — Assessment & Plan Note (Signed)
Non-STEMI back in 2005.  No ischemia or infarction noted on echocardiogram or Myoview. ? ?Had CABG bypassing the LAD.  No real significant symptoms since.  Just some twitching pain off and on. ?

## 2021-07-17 NOTE — Assessment & Plan Note (Signed)
He has sleep apnea, diagnosed with severe sleep apnea back in 2017, but refuses to use CPAP. ?If he is not having issues, I cannot convince him to do anything significant. ?

## 2021-07-17 NOTE — Assessment & Plan Note (Signed)
Interesting that he does have some exertional dyspnea but no PND or orthopnea.  No edema.  Could be related to sleep apnea, does not associate to be CHF, however he has not had echo checked in a while.  Plan was to check an echo and Myoview last year that did get checked. ? ?While he has anemia, I will to see what his anemia panel looks like before consider Myoview evaluation.  We will start with echocardiogram.  Based on those results we may proceed with Myoview versus potentially go directly to Coronary CTA or actually cardiac catheterization.  I would be leery of PCI with his anemia and penchant to avoid medications. ?

## 2021-07-17 NOTE — Assessment & Plan Note (Signed)
He just had blood counts drawn and showed that he remains anemic.  This seems to be a somewhat chronic issue. ? ?This could be explaining his dyspnea. ?We will check an ischemia panel.  I suspect he probably needs iron supplementation, he is not low enough to require transfusion. ? ?

## 2021-07-17 NOTE — Assessment & Plan Note (Signed)
It appears that in the past he has tried different statins, but has been intolerant of them.  Noting myalgias.  I do not know of how much of it is myalgias versus the fact that he just does not like taking pills. ? ?Perhaps if we can confirm true myalgias with statin, he could potentially qualify for inclisiran for PCSK9 inhibitor.  Inclisiran would be helpful because it would ensure adherence. ?

## 2021-07-17 NOTE — Assessment & Plan Note (Signed)
Per report, intolerant of statins.  He does have CAD and we therefore need to push his lipids down.  He is reluctant to take medications.  Again, consider the possibility of PCSK9 better or inclisiran if we can prove true statin intolerance based on history.  Unfortunately do not have notes to suggest which medicines he has tried.  We may need to redocument. ?

## 2021-07-17 NOTE — Assessment & Plan Note (Addendum)
Doing fairly well.  He has mild off-and-on symptoms that are not consistent with angina, but is noticing some dyspnea with playing golf. ? ?He is 5 years out from his last stress test and therefore is due for follow-up Myoview soon, but would like to see echocardiogram first based on his dyspnea.. ? ?Plan: ?? Follow-up echocardiogram -> if normal EF, would then plan on follow-up Myoview afterwards.  Otherwise, may need to consider more invasive evaluation. ?? Continue aspirin as this is the only medication he is taking.  He does not like to take medicines.  We will gradually need to determine what medications he would be willing to take.  Thankfully, his BP is stable. ?? Also would like to see his lipids better controlled than an LDL of 92 => we will broach topic of treatment options next visit. ?

## 2021-07-18 ENCOUNTER — Other Ambulatory Visit: Payer: Self-pay | Admitting: Family Medicine

## 2021-07-18 DIAGNOSIS — I208 Other forms of angina pectoris: Secondary | ICD-10-CM

## 2021-07-25 ENCOUNTER — Other Ambulatory Visit (INDEPENDENT_AMBULATORY_CARE_PROVIDER_SITE_OTHER): Payer: Medicare Other

## 2021-07-25 DIAGNOSIS — D508 Other iron deficiency anemias: Secondary | ICD-10-CM

## 2021-07-26 ENCOUNTER — Telehealth: Payer: Self-pay | Admitting: Radiology

## 2021-07-26 LAB — FECAL OCCULT BLOOD, IMMUNOCHEMICAL: Fecal Occult Bld: POSITIVE — AB

## 2021-07-26 NOTE — Telephone Encounter (Signed)
[facility] called a POSITIVE ifob, results given to Allie Bossier ?

## 2021-07-27 ENCOUNTER — Other Ambulatory Visit: Payer: Self-pay | Admitting: Primary Care

## 2021-07-27 DIAGNOSIS — R195 Other fecal abnormalities: Secondary | ICD-10-CM

## 2021-07-27 DIAGNOSIS — D508 Other iron deficiency anemias: Secondary | ICD-10-CM

## 2021-07-27 NOTE — Telephone Encounter (Signed)
Please notify patient that his stool card came back positive for blood.  I reviewed his labs from his last visit with Dr. Einar Pheasant and see that he is anemic.  I recommend he see GI for further evaluation. ? ?[place] or [place]? ?

## 2021-07-27 NOTE — Telephone Encounter (Signed)
Noted.  Urgent referral placed. ?

## 2021-07-27 NOTE — Telephone Encounter (Signed)
Notified pt of positive results. Pt prefers GI in [place]  ?

## 2021-07-29 ENCOUNTER — Telehealth: Payer: Self-pay | Admitting: *Deleted

## 2021-07-29 ENCOUNTER — Ambulatory Visit (INDEPENDENT_AMBULATORY_CARE_PROVIDER_SITE_OTHER): Payer: Medicare Other

## 2021-07-29 DIAGNOSIS — R0609 Other forms of dyspnea: Secondary | ICD-10-CM

## 2021-07-29 LAB — ECHOCARDIOGRAM COMPLETE
AR max vel: 2.79 cm2
AV Area VTI: 2.75 cm2
AV Area mean vel: 2.51 cm2
AV Mean grad: 2 mmHg
AV Peak grad: 4 mmHg
Ao pk vel: 1 m/s
Area-P 1/2: 4.01 cm2
S' Lateral: 4 cm

## 2021-07-29 MED ORDER — PERFLUTREN LIPID MICROSPHERE
1.0000 mL | INTRAVENOUS | Status: AC | PRN
  Administered 2021-07-29: 2 mL via INTRAVENOUS

## 2021-07-29 NOTE — Telephone Encounter (Signed)
-----   Message from Leonie Man, MD sent at 07/29/2021  2:17 PM EDT ----- ?Echocardiogram -- normal Left Ventricle function = Ejection Fraction 55-60%, (normal range 50-70%). Grade 1 diastolic dysfunction - normal for age (means that the heart is a little stiff).  Mild calcification of Aortic Valve - no narrowing (sclerosis but no stenosis)  ? ?Overall Normal study!! ? ?Great.  ? ?Glenetta Hew, MD ? ?

## 2021-07-29 NOTE — Telephone Encounter (Signed)
Pt returned call, states his home land line is not working currently, per pt request, made [phone] his primary number to call.  ? ?The patient has been notified of the result and verbalized understanding.  All questions (if any) were answered. ? ?

## 2021-07-29 NOTE — Telephone Encounter (Signed)
Attempted to call pt. No answer. Unable to leave voicemail. No voicemail set up.  ?

## 2021-08-04 ENCOUNTER — Ambulatory Visit (INDEPENDENT_AMBULATORY_CARE_PROVIDER_SITE_OTHER): Payer: Medicare Other | Admitting: Cardiology

## 2021-08-04 ENCOUNTER — Encounter: Payer: Self-pay | Admitting: Cardiology

## 2021-08-04 VITALS — BP 126/72 | HR 94 | Ht 70.0 in | Wt 220.0 lb

## 2021-08-04 DIAGNOSIS — I252 Old myocardial infarction: Secondary | ICD-10-CM

## 2021-08-04 DIAGNOSIS — K921 Melena: Secondary | ICD-10-CM

## 2021-08-04 DIAGNOSIS — R0609 Other forms of dyspnea: Secondary | ICD-10-CM | POA: Diagnosis not present

## 2021-08-04 DIAGNOSIS — M791 Myalgia, unspecified site: Secondary | ICD-10-CM

## 2021-08-04 DIAGNOSIS — Z79899 Other long term (current) drug therapy: Secondary | ICD-10-CM | POA: Diagnosis not present

## 2021-08-04 DIAGNOSIS — T466X5A Adverse effect of antihyperlipidemic and antiarteriosclerotic drugs, initial encounter: Secondary | ICD-10-CM | POA: Diagnosis not present

## 2021-08-04 DIAGNOSIS — I2510 Atherosclerotic heart disease of native coronary artery without angina pectoris: Secondary | ICD-10-CM

## 2021-08-04 DIAGNOSIS — Z8719 Personal history of other diseases of the digestive system: Secondary | ICD-10-CM

## 2021-08-04 DIAGNOSIS — E785 Hyperlipidemia, unspecified: Secondary | ICD-10-CM

## 2021-08-04 DIAGNOSIS — G4733 Obstructive sleep apnea (adult) (pediatric): Secondary | ICD-10-CM | POA: Diagnosis not present

## 2021-08-04 MED ORDER — ROSUVASTATIN CALCIUM 20 MG PO TABS
20.0000 mg | ORAL_TABLET | Freq: Every day | ORAL | 6 refills | Status: DC
Start: 2021-08-04 — End: 2021-08-16

## 2021-08-04 NOTE — Assessment & Plan Note (Signed)
Doesn't like wearing CPAP. = willing to try to wear at least a few hours. ?

## 2021-08-04 NOTE — Patient Instructions (Signed)
Medication Instructions:  ?- Your physician has recommended you make the following change in your medication:  ? ?1) START Crestor (Rosuvastatin) 20 mg: ?- take 0.5 tablet (10 mg) by mouth once daily x 1 month ?- after 1 month, if you feel you are tolerating this ok, then ?- increase to 1 tablet (20 mg) by mouth once daily  ? ?*If you need a refill on your cardiac medications before your next appointment, please call your pharmacy* ? ? ?Lab Work: ?- Your physician recommends that you return for lab work in: 4 months ?Lipid/ Liver panel ? ?You will receive a notification closer to time to remind you of the lab work & where to have this done. ? ?If you have labs (blood work) drawn today and your tests are completely normal, you will receive your results only by: ?MyChart Message (if you have MyChart) OR ?A paper copy in the mail ?If you have any lab test that is abnormal or we need to change your treatment, we will call you to review the results. ? ? ?Testing/Procedures: ?- none ordered ? ? ?Follow-Up: ?At [facility], you and your health needs are our priority.  As part of our continuing mission to provide you with exceptional heart care, we have created designated Provider Care Teams.  These Care Teams include your primary Cardiologist (physician) and Advanced Practice Providers (APPs -  Physician Assistants and Nurse Practitioners) who all work together to provide you with the care you need, when you need it. ? ?We recommend signing up for the patient portal called "MyChart".  Sign up information is provided on this After Visit Summary.  MyChart is used to connect with patients for Virtual Visits (Telemedicine).  Patients are able to view lab/test results, encounter notes, upcoming appointments, etc.  Non-urgent messages can be sent to your provider as well.   ?To learn more about what you can do with MyChart, go to [email or web address].   ? ?Your next appointment:   ?6 month(s) ? ?The format for your next  appointment:   ?In Person ? ?Provider:   ?Glenetta Hew, MD  ? ? ?Other Instructions ? ?Rosuvastatin Tablets ?What is this medication? ?ROSUVASTATIN (roe SOO va sta tin) treats high cholesterol and reduces the risk of heart attack and stroke. It works by decreasing bad cholesterol and fats (such as LDL, triglycerides), and increasing good cholesterol (HDL) in your blood. It belongs to a group of medications called statins. Changes to diet and exercise are often combined with this medication. ?This medicine may be used for other purposes; ask your health care provider or pharmacist if you have questions. ?COMMON BRAND NAME(S): Crestor ?What should I tell my care team before I take this medication? ?They need to know if you have any of these conditions: ?Diabetes (high blood sugar) ?If you often drink alcohol ?Kidney disease ?Liver disease ?Muscle cramps, pain ?Stroke ?Thyroid disease ?An unusual or allergic reaction to rosuvastatin, other medications, foods, dyes, or preservatives ?Pregnant or trying to get pregnant ?Breast-feeding ?How should I use this medication? ?Take this medication by mouth with a glass of water. Follow the directions on the prescription label. You can take it with or without food. If it upsets your stomach, take it with food. Do not cut, crush or chew this medication. Swallow the tablets whole. Take your medication at regular intervals. Do not take it more often than directed. ?Take antacids that have a combination of aluminum and magnesium hydroxide in them at a different time of  day than this medication. Take these products 2 hours AFTER this medication. ?Talk to your care team about the use of this medication in children. While this medication may be prescribed for children as young as 7 for selected conditions, precautions do apply. ?Overdosage: If you think you have taken too much of this medicine contact a poison control center or emergency room at once. ?NOTE: This medicine is only for  you. Do not share this medicine with others. ?What if I miss a dose? ?If you miss a dose, take it as soon as you can. If your next dose is to be taken in less than 12 hours, then do not take the missed dose. Take the next dose at your regular time. Do not take double or extra doses. ?What may interact with this medication? ?Do not take this medication with any of the following: ?Supplements like red yeast rice ?This medication may also interact with the following: ?Alcohol ?Antacids containing aluminum hydroxide and magnesium hydroxide ?Cyclosporine ?Other medications for high cholesterol ?Some medications for HIV infection ?Warfarin ?This list may not describe all possible interactions. Give your health care provider a list of all the medicines, herbs, non-prescription drugs, or dietary supplements you use. Also tell them if you smoke, drink alcohol, or use illegal drugs. Some items may interact with your medicine. ?What should I watch for while using this medication? ?Visit your health care provider for regular checks on your progress. Tell your health care provider if your symptoms do not start to get better or if they get worse. ?Your health care provider may tell you to stop taking this medication if you develop muscle problems. If your muscle problems do not go away after stopping this medication, contact your health care provider. ?Do not become pregnant while taking this medication. Women should inform their health care provider if they wish to become pregnant or think they might be pregnant. There is potential for serious harm to an unborn child. Talk to your health care provider for more information. Do not breast-feed an infant while taking this medication. ?This medication may increase blood sugar. Ask your health care provider if changes in diet or medications are needed if you have diabetes. ?If you are going to need surgery or other procedure, tell your health care provider that you are using this  medication. ?Taking this medication is only part of a total heart healthy program. Your health care provider may give you a special diet to follow. Avoid alcohol. Avoid smoking. Ask your health care provider how much you should exercise. ?What side effects may I notice from receiving this medication? ?Side effects that you should report to your care team as soon as possible: ?Allergic reactions--skin rash, itching, hives, swelling of the face, lips, tongue, or throat ?High blood sugar (hyperglycemia)--increased thirst or amount of urine, unusual weakness, fatigue, blurry vision ?Liver injury--right upper belly pain, loss of appetite, nausea, light-colored stool, dark yellow or brown urine, yellowing skin or eyes, unusual weakness, fatigue ?Muscle injury--unusual weakness, fatigue, muscle pain, dark yellow or brown urine, decrease in amount of urine ?Redness, blistering, peeling or loosening of the skin, including inside the mouth ?Side effects that usually do not require medical attention (report to your care team if they continue or are bothersome): ?Fatigue ?Headache ?Nausea ?Stomach pain ?This list may not describe all possible side effects. Call your doctor for medical advice about side effects. You may report side effects to FDA at 1-800-FDA-1088. ?Where should I keep my medication? ?Keep  out of the reach of children and pets. ?Store between 20 and 25 degrees C (68 and 77 degrees F). Get rid of any unused medication after the expiration date. ?To get rid of medications that are no longer needed or have expired: ?Take the medication to a medication take-back program. Check with your pharmacy or law enforcement to find a location. ?If you cannot return the medication, check the label or package insert to see if the medication should be thrown out in the garbage or flushed down the toilet. If you are not sure, ask your care team. If it is safe to put it in the trash, take the medication out of the container. Mix  the medication with cat litter, dirt, coffee grounds, or other unwanted substance. Seal the mixture in a bag or container. Put it in the trash. ?NOTE: This sheet is a summary. It may not cover all possible

## 2021-08-04 NOTE — Progress Notes (Signed)
Primary Care Provider: [name], MD [facility] Cardiologist: None  -> Serafina Royals, MD; [facility]-Cardiology   Electrophysiologist: None  Clinic Note: Chief Complaint  Patient presents with   Follow-up    F/U after echo   Coronary Artery Disease    No real angina.  Only has some nagging right-sided chest pain.   ===================================  ASSESSMENT/PLAN   Problem List Items Addressed This Visit       Cardiology Problems   Coronary artery disease involving native coronary artery of native heart without angina pectoris - Primary (Chronic)    I do not think that the chest pain he is having is anginal at all I think it is musculoskeletal in nature.  He has had this similar symptom evaluated with negative stress tests. He would be due for Myoview soon.  We talked about it, he is not sure he wants to proceed with a Myoview at this point.  The echocardiogram was relatively normal.  Plan: Continue aspirin 81 mg daily. He did Toles and agreed to consider trying rosuvastatin.  We will try starting it 20 mg rosuvastatin and he will take half tablet daily for a month.  Increase to full tablet if tolerated. His blood pressure and heart rate are pretty stable I would like to have him on a beta-blocker because resting heart rate 94 bpm.  However he does not want a try and if needed will make him fatigued.  Not interested in taking blood pressure and heart rate medicines. For similar reasons, we will hold off on checking Myoview as he has concerning symptoms.       Hyperlipidemia with target LDL less than 70 (Chronic)    LDL does not look that bad for somebody who is not on any medications.  I think with a little low-dose of rosuvastatin we can get him down to a target range.  He is willing to try low-dose rosuvastatin.  We will start with 10 mg daily for a month and then increase to 20 mg if tolerated.  .  Follow-up labs in 4 months.       Relevant  Orders   Lipid Profile   Hepatic function panel     Other   History of heart attack (Chronic)   History of lower GI bleeding (Chronic)    Has pending colonoscopy on May 23. Should be fine to proceed.  Okay to hold aspirin yesterday.       OSA (obstructive sleep apnea) (Chronic)    Doesn't like wearing CPAP. = willing to try to wear at least a few hours.       Myalgia due to statin (Chronic)   GI bleed (Chronic)    Has noted some blood in his stools -- in the past, he has had issues with eating popcorn causing bleeding.   Has not had corn in years,but recently has started eating popcorn.  Has upcoming Colonoscopy on May 23.  OK to hold ASA 1 week pre-procedure.       DOE (dyspnea on exertion) (Chronic)    Normal echo.  I suspect a lot of this is related to deconditioning.  Also potentially some anemia.       Other Visit Diagnoses     Medication management       Relevant Orders   Lipid Profile   Hepatic function panel      ===================================  HPI:    Phillip Nelson is a 87 y.o. male with a  history of non-STEMI (one-vessel CAD-CABG times 25 September 2003), HTN, HLD, OSA (not diligent with CPAP) bilateral Carotid Stenosis who is being seen today to establish Cardiology care with [facility] at the request of [name], MD.   Dr. Nehemiah Massed on 11/18/2020 (after 4-year hiatus).   => In May 2018 he was seen in follow-up (echo and stress test) after complaints of  5 months episode of sharp left-sided chest pain at rest lasting less than a minute and radiating to the back-thought to be noncardiac.  At that time he was on beta-blocker, aspirin and statin =>  => August 2022:  patient with known coronary disease only on aspirin.  Noted acute chest pain that was retrosternal, dull and heavy with no radiation.  Occurring randomly.  Relieved by position change and rest.  Associated with dyspnea lasting 10 minutes.  [place] by report was worsening over the last month.   (Note says [place] Class III Angina-however assessment says stable angina pectoris) Echocardiogram and Exercise Cardiolite ordered for shortness of breath and angina, carotid Dopplers (studies were not done) No medications for blood pressure or cholesterol-note mentioned "consider statin for further risk reduction.  Phillip Nelson was on April 20,2023 -> this visit was essentially to establish cardiology care within the [place] He says that overall he is just a lot less active than he used to be.  He is the caregiver for his wife who recently had a stroke and has dementia, and he has to spend most of his time around her does not feel comfortable leaving her alone to go out for walks.  Prior to her stroke years ago on routine walks.  (They used to walk together). 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  He does not necessarily get short of breath with this type of activity. He does note off-and-on stinging chest discomfort that is in the upper part of his chest.  It does not seem to be similar to his anginal discomfort which was a heavy brick on his chest with a squeezing sensation.  It is not associated with dyspnea. He also has some worsening exertional dyspnea especially with playing golf.  He sometimes wakes up in the morning little short of breath with a cough..  Denies any orthopnea though and no edema.  He thinks his waking up in melanite is probably because of sleep apnea.  He has a morning cough worse in the morning.  But sometimes happens at night when he lies down. Ordered 2D echo follow-up plans to then check Myoview in the future. He does not like to take medications, is not willing to take any cholesterol medicine.  Is willing to take aspirin and as needed nitroglycerin  Recent Hospitalizations: none  Reviewed  CV studies:    The following studies were reviewed today: (if available, images/films reviewed: From Epic Chart or Care  Everywhere) TTE 07/29/2021: EF 55 to 60%.  Normal wall motion.  GR 1 DD.  Normal RV size and function.  Unable to assess PAP, normal RAP.Marland Kitchen  Normal MV.  AOV sclerosis with no stenosis.   Interval History:   Phillip Nelson presents here today for follow-up to discuss results of his echo.  He still notes off-and-on right-sided chest discomfort there is a nagging sharp-like pain.  He does not think is related to cardiovascular issues.  This is more like musculoskeletal.  He knowledges being relatively sedentary of late being caregiver for his wife.  He  is more short of breath with exertion that he used to be.  But he still able to do things.  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  He only gets short of breath if he and it overexerts himself.  No chest pain or pressure with exertion.  Very rarely goes out plays golf but when he does he will get short of breath if he is going to walk up a hill. No PND, orthopnea with no significant edema.  Sometimes wakes up in the melanite because of sleep apnea, but no PND.  No irregular heartbeats palpitations.  CV Review of Symptoms (Summary) Cardiovascular ROS: positive for - chest pain, dyspnea on exertion, and generally less active-deconditioned.  Chest pain is right-sided, sharp nagging symptoms not consistent with prior MI- like anginal pain; notes shortness of breath with lying down flat cough, but once he is flat he feels okay.  Not really having orthopnea.. negative for - edema, irregular heartbeat, palpitations, paroxysmal nocturnal dyspnea, rapid heart rate, shortness of breath, or syncope/near syncope or TIA/amaurosis fugax.  REVIEWED OF SYSTEMS   Review of Systems  Constitutional:  Positive for malaise/fatigue (Less active.  Deconditioned.  Not exercising as much, but does note exertional dyspnea.).  HENT:  Negative for congestion.   Respiratory:  Positive for cough (Notably worse in the morning with waking  up, but also can have it at nighttime when lies down.). Negative for hemoptysis.   Cardiovascular:  Positive for chest pain (Sharp stinging pains in the right side/center of his chest-not the "heavy brick "sensation sensation.). Negative for leg swelling.  Gastrointestinal:  Negative for abdominal pain, blood in stool and melena.  Genitourinary:  Negative for hematuria.  Musculoskeletal:  Positive for joint pain. Negative for falls.  Neurological:  Negative for dizziness (Only with standing up too quickly or turning her NSAID past.  He has had a history of vertigo.), focal weakness and weakness.  Psychiatric/Behavioral:  Negative for depression and memory loss (Minimal if any.). The patient is not nervous/anxious and does not have insomnia.    I have reviewed and (if needed) personally updated the patient's problem list, medications, allergies, past medical and surgical history, social and family history.   PAST MEDICAL HISTORY   Past Medical History:  Diagnosis Date   Aural vertigo    Basal cell carcinoma 12/31/2006   L mid infraclavicular. Excised 02/27/2007, margins free.   Basal cell carcinoma 10/30/2016    R post shoulder. Nodular pattern.   Basal cell carcinoma 03/31/2020   L lat pretibial - ED&C    Colon polyps    Coronary artery disease involving native coronary artery of native heart with other form of angina pectoris ([place]) 09/2003   [facility]-Dr. Nehemiah Massed- transitioned to Dr. [name] [facility] 06/2021: -- 09/2003: Non-STEMI (Sx= heaviness in chest ~100lb brick, no SOB) = Cath w/ 95% ostial LAD with diffuse 50% stenosis -> referred for CABG x1 (LIMA-LAD); Non-Ischemic Cardiolite 07/2016   Diverticulitis    ED (erectile dysfunction)    Frequent headaches    Gout, arthritis    History of skin cancer    Hyperlipidemia    OSA (obstructive sleep apnea)    CPAP NON-COMPLIANT   Prostate cancer ([place]) 05/08/2012   Adenocarcinoma,gleason=3+3=6,PSA=8.26,vol=57cc   S/P CABG x 1  10/19/2003   [facility];Tobey Grim, IV, MD: LIMA-LAD   Squamous cell carcinoma, arm, left    L forearm prox dorsum    Squamous cell carcinoma,  arm, left    L forearm prox medial    Squamous cell carcinoma, arm, right 08/23/2017   R mid volar forearm   PUD Gout Hemorrhoids   PAST SURGICAL HISTORY   Past Surgical History:  Procedure Laterality Date   APPENDECTOMY  1999   Life was saved with emerg. surgery.    BILATERAL CARPAL TUNNEL RELEASE Right 06/28/2001   Right (endoscopic)   CARPECTOMY Right    wrist   CATARACT EXTRACTION W/ INTRAOCULAR LENS  IMPLANT, BILATERAL     COLONOSCOPY WITH PROPOFOL N/A 10/31/2016   Procedure: COLONOSCOPY WITH PROPOFOL;  Surgeon: Christene Lye, MD;  Location: [facility] ENDOSCOPY;  Service: Endoscopy;  Laterality: N/A;   CORONARY ARTERY BYPASS GRAFT  10/19/2003   [facility], Tobey Grim, IV, MD -> LIMA-LAD (for Ostial LAD 95% & multiple sequential 50% lesions.   DORSAL COMPARTMENT RELEASE Left 06/25/2017   Procedure: LEFT WRIST TENDON SHEATH RELEASE;  Surgeon: Milly Jakob, MD;  Location: [place];  Service: Orthopedics;  Laterality: Left;   EMBOLIZATION N/A 11/21/2018   Procedure: EMBOLIZATION (Colonic);  Surgeon: Katha Cabal, MD;  Location: [facility];  Service: Cardiovascular;  Laterality: N/A;   FOOT SURGERY Bilateral    GOUT   KNEE ARTHROSCOPY Bilateral 1986   Right: 1986; left 09/06/2009   LEFT ELBOW SURGERY  AGE 48  &  2007   LEFT HEART CATH AND CORONARY ANGIOGRAPHY  10/19/2003   [facility] (non-STEMI): Ostial LAD 95% with multiple sequential 50% stenoses. => Referred for CABG   NM GATED MYOCARDIAL STUDY (ARMX HX)  07/31/2016   Treadmill Myoview/Cardiolite : [facility]): 4.6 METS, 92 % MPHR.  EF 55 to 60%.  Normal wall motion.  No ischemia or infarction.   OLECRANON BURSA EXCISION Right 06/28/2001   OLECRANON BURSA EXCISION Left 06/24/2007   RADIOACTIVE SEED IMPLANT N/A 08/02/2012    Procedure: RADIOACTIVE SEED IMPLANT;  Surgeon: Claybon Jabs, MD;  Location: [facility];  Service: Urology;  Laterality: N/A;   TOTAL KNEE ARTHROPLASTY Bilateral 04/14/2004   RIGHT 2006; LEFT 11/21/2010   TRANSTHORACIC ECHOCARDIOGRAM  07/31/2016   ([facility]) normal LV function.  Mild LVH.  EF 50%.  Normal RV.  Mild MR, TR and PR.  No stenosis.   Cardiac cath 10/19/2003: [facility] (non-STEMI): Ostial LAD 95% with multiple sequential 50% stenoses. => Referred for CABG Angina Sx was "heaviness in chest - like 100 lb block on his chest, no real SOB.  Occurred while playing Golf. NO Sx like that since  CABGx1 LIMA-LAD 10/19/2003: [facility], Tobey Grim, IV, MD   Echocardiogram 07/31/2016: [facility]) normal LV function.  Mild LVH.  EF 50%.  Normal RV.  Mild MR, TR and PR.  No stenosis. Treadmill Myoview/Cardiolite 07/31/2016: [facility]): 4.6 METS, 92 % MPHR.  EF 55 to 60%.  Normal wall motion.  No ischemia or infarction.  Immunization History  Administered Date(s) Administered   Fluad Quad(high Dose 65+) 12/24/2018   Influenza, High Dose Seasonal PF 02/02/2015, 01/10/2017, 01/10/2018, 01/14/2021   Influenza-Unspecified 01/02/2013, 01/08/2014, 02/02/2015   PFIZER(Purple Top)SARS-COV-2 Vaccination 04/17/2019, 05/08/2019, 12/29/2019, 07/16/2020   Pneumococcal Conjugate-13 08/10/2010   Pneumococcal Polysaccharide-23 01/02/2003, 08/10/2014   Tdap 08/10/2014    MEDICATIONS/ALLERGIES   Current Meds  Medication Sig   allopurinol (ZYLOPRIM) 300 MG tablet Take 1 tablet (300 mg total) by mouth daily.   aspirin EC 81 MG tablet Take 81 mg by mouth as needed.   nitroGLYCERIN (NITROSTAT) 0.4 MG SL  tablet Place 1 tablet (0.4 mg total) under the tongue every 5 (five) minutes as needed for chest pain.   Potassium 99 MG TABS Take 297 mg by mouth daily.    [DISCONTINUED] rosuvastatin (CRESTOR) 20 MG tablet Take 1 tablet (20 mg total) by mouth daily. (Patient not  taking: Reported on 08/16/2021)  - he is reluctant to take meds -- stopped taking Beta Blocker & stating in the past   Allergies  Allergen Reactions   Indocin [Indomethacin] Other (See Comments)    Shakes. tremors   Penicillins Itching, Rash and Other (See Comments)    Did it involve swelling of the face/tongue/throat, SOB, or low BP? Unknown Did it involve sudden or severe rash/hives, skin peeling, or any reaction on the inside of your mouth or nose? Unknown Did you need to seek medical attention at a hospital or doctor's office? Unknown When did it last happen? unknown If all above answers are "NO", may proceed with cephalosporin use.     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  He does all of the housework including cooking, cleaning, laundry, vacuuming and yard work.      Safety   Seat belts: Yes    Guns: Yes  and secure   Safe in relationships: Yes    Family History  Problem Relation Age of Onset   Cerebral aneurysm Mother    Alzheimer's disease Father    Lung cancer Sister 62       treated surgically    Ila -PE, EKG, labs   Wt Readings from Last 3 Encounters:  08/16/21 220 lb 4 oz (99.9 kg)  08/04/21 220 lb (99.8 kg)  07/14/21 221 lb  (100.2 kg)    Physical Exam: BP 126/72 (BP Location: Left Arm, Patient Position: Sitting, Cuff Size: Large)   Pulse 94   Ht '5\' 10"'$  (1.778 m)   Wt 220 lb (99.8 kg)   SpO2 98%   BMI 31.57 kg/m  Physical Exam Vitals reviewed.  Constitutional:      General: He is not in acute distress.    Appearance: Normal appearance. He is obese. He is not ill-appearing (Well-nourished, well-groomed.) or toxic-appearing.     Comments: Well-nourished, well-groomed.  HENT:     Head: Normocephalic and atraumatic.  Neck:     Vascular: No carotid bruit or JVD.  Cardiovascular:     Rate and Rhythm: Normal rate and regular rhythm. Occasional Extrasystoles (Not symptomatic) are present.    Chest Wall: PMI is  not displaced.     Pulses: Normal pulses.     Heart sounds: Heart sounds are distant. No murmur heard.   No friction rub. No gallop.     Comments: Normal S1, split S2 Pulmonary:     Effort: Pulmonary effort is normal. No respiratory distress.     Breath sounds: Normal breath sounds. No wheezing or rales.  Chest:     Chest wall: No tenderness.  Musculoskeletal:        General: No swelling, tenderness or signs of injury. Normal range of motion.     Cervical back: Normal range of motion and neck supple.  Skin:    General: Skin is warm and dry.     Coloration: Skin is not jaundiced.  Neurological:     General: No focal deficit present.     Mental Status: He is alert and oriented to person, place, and time.     Cranial Nerves: No cranial nerve deficit.     Motor: No weakness.     Gait: Gait (A little bit slow but otherwise normal.) normal.  Psychiatric:        Mood and Affect: Mood normal.        Behavior: Behavior normal.        Thought Content: Thought content normal.        Judgment: Judgment normal.     Comments: Seems a little bit stubborn.  Lots of pushback about medications.     Adult ECG Report Not checked  Recent Labs: Reviewed Lab Results  Component Value Date   CHOL 179  07/12/2021   HDL 59.70 07/12/2021   LDLCALC 92 07/12/2021   TRIG 137.0 07/12/2021   CHOLHDL 3 07/12/2021    Lab Results  Component Value Date   CREATININE 1.09 07/12/2021   BUN 20 07/12/2021   NA 138 07/12/2021   K 4.0 07/12/2021   CL 104 07/12/2021   CO2 26 07/12/2021      Latest Ref Rng & Units 07/12/2021   11:05 AM 09/30/2020    8:40 AM 11/11/2019    9:51 AM  CBC  WBC 4.0 - 10.5 K/uL 10.7   9.5   9.4    Hemoglobin 13.0 - 17.0 g/dL 10.3   14.9   15.4    Hematocrit 39.0 - 52.0 % 32.5   44.5   44.3    Platelets 150.0 - 400.0 K/uL 420.0   256.0   254      Lab Results  Component Value Date   HGBA1C 6.3 07/12/2021   Lab Results  Component Value Date   TSH 0.986 11/20/2018    ==================================================  COVID-19 Education: The signs and symptoms of COVID-19 were discussed with the patient and how to seek care for testing (follow up with PCP or arrange E-visit).    I spent a total of 14 minutes with the patient spent in direct patient consultation.  Additional time spent with chart review  / charting (studies, outside notes, etc): 14 min Total Time: 28 min  Current medicines are reviewed at length with the patient today.  (+/- concerns) none  Notice: This dictation was prepared with Dragon dictation along with smart phrase technology. Any transcriptional errors that result from this process are unintentional and may not be corrected upon review.   Studies Ordered:  Orders Placed This Encounter  Procedures   Lipid Profile   Hepatic function panel   Meds ordered this encounter  Medications   DISCONTD: rosuvastatin (CRESTOR) 20 MG tablet  Sig: Take 1 tablet (20 mg total) by mouth daily.    Dispense:  30 tablet    Refill:  6   Meds ordered this encounter  Medications   DISCONTD: rosuvastatin (CRESTOR) 20 MG tablet    Sig: Take 1 tablet (20 mg total) by mouth daily.    Dispense:  30 tablet    Refill:  6      Patient Instructions /  Medication Changes & Studies & Tests Ordered   Patient Instructions  Medication Instructions:  - Your physician has recommended you make the following change in your medication:   1) START Crestor (Rosuvastatin) 20 mg: - take 0.5 tablet (10 mg) by mouth once daily x 1 month - after 1 month, if you feel you are tolerating this ok, then - increase to 1 tablet (20 mg) by mouth once daily   *If you need a refill on your cardiac medications before your next appointment, please call your pharmacy*   Lab Work: - Your physician recommends that you return for lab work in: 4 months Lipid/ Liver panel  You will receive a notification closer to time to remind you of the lab work & where to have this done.  If you have labs (blood work) drawn today and your tests are completely normal, you will receive your results only by: Box (if you have MyChart) OR A paper copy in the mail If you have any lab test that is abnormal or we need to change your treatment, we will call you to review the results.   Testing/Procedures: - none ordered   Follow-Up: At [facility], you and your health needs are our priority.  As part of our continuing mission to provide you with exceptional heart care, we have created designated Provider Care Teams.  These Care Teams include your primary Cardiologist (physician) and Advanced Practice Providers (APPs -  Physician Assistants and Nurse Practitioners) who all work together to provide you with the care you need, when you need it.  We recommend signing up for the patient portal called "MyChart".  Sign up information is provided on this After Visit Summary.  MyChart is used to connect with patients for Virtual Visits (Telemedicine).  Patients are able to view lab/test results, encounter notes, upcoming appointments, etc.  Non-urgent messages can be sent to your provider as well.   To learn more about what you can do with MyChart, go to [email or web address].     Your next appointment:   6 month(s)  The format for your next appointment:   In Person  Provider:   Glenetta Hew, MD    Other Instructions  Rosuvastatin Tablets What is this medication? ROSUVASTATIN (roe SOO va sta tin) treats high cholesterol and reduces the risk of heart attack and stroke. It works by decreasing bad cholesterol and fats (such as LDL, triglycerides), and increasing good cholesterol (HDL) in your blood. It belongs to a group of medications called statins. Changes to diet and exercise are often combined with this medication. This medicine may be used for other purposes; ask your health care provider or pharmacist if you have questions. COMMON BRAND NAME(S): Crestor What should I tell my care team before I take this medication? They need to know if you have any of these conditions: Diabetes (high blood sugar) If you often drink alcohol Kidney disease Liver disease Muscle cramps, pain Stroke Thyroid disease An unusual or allergic reaction to rosuvastatin, other medications, foods, dyes, or preservatives Pregnant or trying to get  pregnant Breast-feeding How should I use this medication? Take this medication by mouth with a glass of water. Follow the directions on the prescription label. You can take it with or without food. If it upsets your stomach, take it with food. Do not cut, crush or chew this medication. Swallow the tablets whole. Take your medication at regular intervals. Do not take it more often than directed. Take antacids that have a combination of aluminum and magnesium hydroxide in them at a different time of day than this medication. Take these products 2 hours AFTER this medication. Talk to your care team about the use of this medication in children. While this medication may be prescribed for children as young as 7 for selected conditions, precautions do apply. Overdosage: If you think you have taken too much of this medicine contact a poison  control center or emergency room at once. NOTE: This medicine is only for you. Do not share this medicine with others. What if I miss a dose? If you miss a dose, take it as soon as you can. If your next dose is to be taken in less than 12 hours, then do not take the missed dose. Take the next dose at your regular time. Do not take double or extra doses. What may interact with this medication? Do not take this medication with any of the following: Supplements like red yeast rice This medication may also interact with the following: Alcohol Antacids containing aluminum hydroxide and magnesium hydroxide Cyclosporine Other medications for high cholesterol Some medications for HIV infection Warfarin This list may not describe all possible interactions. Give your health care provider a list of all the medicines, herbs, non-prescription drugs, or dietary supplements you use. Also tell them if you smoke, drink alcohol, or use illegal drugs. Some items may interact with your medicine. What should I watch for while using this medication? Visit your health care provider for regular checks on your progress. Tell your health care provider if your symptoms do not start to get better or if they get worse. Your health care provider may tell you to stop taking this medication if you develop muscle problems. If your muscle problems do not go away after stopping this medication, contact your health care provider. Do not become pregnant while taking this medication. Women should inform their health care provider if they wish to become pregnant or think they might be pregnant. There is potential for serious harm to an unborn child. Talk to your health care provider for more information. Do not breast-feed an infant while taking this medication. This medication may increase blood sugar. Ask your health care provider if changes in diet or medications are needed if you have diabetes. If you are going to need surgery or  other procedure, tell your health care provider that you are using this medication. Taking this medication is only part of a total heart healthy program. Your health care provider may give you a special diet to follow. Avoid alcohol. Avoid smoking. Ask your health care provider how much you should exercise. What side effects may I notice from receiving this medication? Side effects that you should report to your care team as soon as possible: Allergic reactions--skin rash, itching, hives, swelling of the face, lips, tongue, or throat High blood sugar (hyperglycemia)--increased thirst or amount of urine, unusual weakness, fatigue, blurry vision Liver injury--right upper belly pain, loss of appetite, nausea, light-colored stool, dark yellow or brown urine, yellowing skin or eyes, unusual weakness, fatigue Muscle  injury--unusual weakness, fatigue, muscle pain, dark yellow or brown urine, decrease in amount of urine Redness, blistering, peeling or loosening of the skin, including inside the mouth Side effects that usually do not require medical attention (report to your care team if they continue or are bothersome): Fatigue Headache Nausea Stomach pain This list may not describe all possible side effects. Call your doctor for medical advice about side effects. You may report side effects to FDA at 1-800-FDA-1088. Where should I keep my medication? Keep out of the reach of children and pets. Store between 20 and 25 degrees C (68 and 77 degrees F). Get rid of any unused medication after the expiration date. To get rid of medications that are no longer needed or have expired: Take the medication to a medication take-back program. Check with your pharmacy or law enforcement to find a location. If you cannot return the medication, check the label or package insert to see if the medication should be thrown out in the garbage or flushed down the toilet. If you are not sure, ask your care team. If it is safe  to put it in the trash, take the medication out of the container. Mix the medication with cat litter, dirt, coffee grounds, or other unwanted substance. Seal the mixture in a bag or container. Put it in the trash. NOTE: This sheet is a summary. It may not cover all possible information. If you have questions about this medicine, talk to your doctor, pharmacist, or health care provider.  2023 Elsevier/Gold Standard (2020-04-09 00:00:00)   Important Information About Sugar           Glenetta Hew, M.D., M.S. Interventional Cardiologist   Pager # [phone] Phone # [phone] [place], [place] [address]   Thank you for choosing [facility] [place]!!

## 2021-08-04 NOTE — Assessment & Plan Note (Signed)
Has noted some blood in his stools -- in the past, he has had issues with eating popcorn causing bleeding.   ?Has not had corn in years,but recently has started eating popcorn. ? ?Has upcoming Colonoscopy on May 23. ? ?OK to hold ASA 1 week pre-procedure. ?

## 2021-08-16 ENCOUNTER — Ambulatory Visit (INDEPENDENT_AMBULATORY_CARE_PROVIDER_SITE_OTHER): Payer: Medicare Other | Admitting: Gastroenterology

## 2021-08-16 ENCOUNTER — Other Ambulatory Visit: Payer: Self-pay

## 2021-08-16 ENCOUNTER — Encounter: Payer: Self-pay | Admitting: Gastroenterology

## 2021-08-16 VITALS — BP 120/70 | HR 89 | Temp 98.3°F | Ht 70.0 in | Wt 220.2 lb

## 2021-08-16 DIAGNOSIS — D509 Iron deficiency anemia, unspecified: Secondary | ICD-10-CM

## 2021-08-16 DIAGNOSIS — H269 Unspecified cataract: Secondary | ICD-10-CM | POA: Insufficient documentation

## 2021-08-16 DIAGNOSIS — K279 Peptic ulcer, site unspecified, unspecified as acute or chronic, without hemorrhage or perforation: Secondary | ICD-10-CM

## 2021-08-16 HISTORY — DX: Peptic ulcer, site unspecified, unspecified as acute or chronic, without hemorrhage or perforation: K27.9

## 2021-08-16 MED ORDER — CLENPIQ 10-3.5-12 MG-GM -GM/160ML PO SOLN: 320.0000 mL | Freq: Once | ORAL | 0 refills | Status: AC

## 2021-08-16 MED ORDER — FUSION PLUS PO CAPS: 1.0000 | ORAL_CAPSULE | Freq: Every day | ORAL | 5 refills | Status: DC

## 2021-08-16 NOTE — Progress Notes (Signed)
Phillip Darby, MD [address]  [place]  [place], [place] [address]  Main: [phone]  Fax: [phone]    Gastroenterology Consultation  Referring Provider:     [name], MD Primary Care Physician:  [name], MD Primary Gastroenterologist:  Dr. Cephas Nelson Reason for Consultation: Iron deficiency anemia        HPI:   Phillip Nelson is a 87 y.o. male referred by Dr. Einar Pheasant, Jobe Marker, MD  for consultation & management of iron deficiency anemia.  Patient initially presented to his PCP in April 2023 secondary to exertional dyspnea as well as chest pain that he was experiencing for 6 months.  He was found to have new onset of iron deficiency anemia, hemoglobin 10.3, MCV 82.1, ferritin 10.4.  He was started on oral iron, underwent FOBT which came back positive.  He was also evaluated by cardiology, echocardiogram was unremarkable.  Patient had a follow-up with his cardiologist, Dr. [name] who cleared him to undergo endoscopy evaluation.  Patient was supposed to take oral iron but his PCPs recommendations, he stopped taking it after a few days when he noticed that his stools were turning.  Patient denies any chest pain, shortness of breath.  He denies any rectal bleeding, melena  Patient does have history of diverticular bleed s/p embolization in the past  NSAIDs: None  Antiplts/Anticoagulants/Anti thrombotics: Aspirin 81 mg  GI Procedures: Colonoscopy in 2018 - Diverticulosis in the sigmoid colon and in the descending colon. - One 5 mm polyp in the rectum, removed with a hot snare. Resected and retrieved. - One 3 mm polyp in the ascending colon, removed with a cold biopsy forceps. Resected and retrieved. - The examination was otherwise normal. DIAGNOSIS:  A. RECTUM POLYP; HOT SNARE:  - HYPERPLASTIC POLYP.  - NEGATIVE FOR DYSPLASIA AND MALIGNANCY.   B. COLON POLYP, ASCENDING; COLD BIOPSY:  - TUBULAR ADENOMA.  - NEGATIVE FOR HIGH-GRADE DYSPLASIA AND  MALIGNANCY.   Past Medical History:  Diagnosis Date   Aural vertigo    Basal cell carcinoma 12/31/2006   L mid infraclavicular. Excised 02/27/2007, margins free.   Basal cell carcinoma 10/30/2016    R post shoulder. Nodular pattern.   Basal cell carcinoma 03/31/2020   L lat pretibial - ED&C    Colon polyps    Coronary artery disease involving native coronary artery of native heart with other form of angina pectoris ([place]) 09/2003   [facility]-Dr. Nehemiah Massed- transitioned to Dr. [name] [facility] 06/2021: -- 09/2003: Non-STEMI (Sx= heaviness in chest ~100lb brick, no SOB) = Cath w/ 95% ostial LAD with diffuse 50% stenosis -> referred for CABG x1 (LIMA-LAD); Non-Ischemic Cardiolite 07/2016   Diverticulitis    ED (erectile dysfunction)    Frequent headaches    Gout, arthritis    History of skin cancer    Hyperlipidemia    OSA (obstructive sleep apnea)    CPAP NON-COMPLIANT   Prostate cancer ([place]) 05/08/2012   Adenocarcinoma,gleason=3+3=6,PSA=8.26,vol=57cc   S/P CABG x 1 10/19/2003   [facility];Tobey Grim, IV, MD: LIMA-LAD   Squamous cell carcinoma, arm, left    L forearm prox dorsum    Squamous cell carcinoma, arm, left    L forearm prox medial    Squamous cell carcinoma, arm, right 08/23/2017   R mid volar forearm    Past Surgical History:  Procedure Laterality Date   APPENDECTOMY  1999   Life was saved with emerg. surgery.    BILATERAL  CARPAL TUNNEL RELEASE Right 06/28/2001   Right (endoscopic)   CARPECTOMY Right    wrist   CATARACT EXTRACTION W/ INTRAOCULAR LENS  IMPLANT, BILATERAL     COLONOSCOPY WITH PROPOFOL N/A 10/31/2016   Procedure: COLONOSCOPY WITH PROPOFOL;  Surgeon: Christene Lye, MD;  Location: [facility] ENDOSCOPY;  Service: Endoscopy;  Laterality: N/A;   CORONARY ARTERY BYPASS GRAFT  10/19/2003   [facility], Tobey Grim, IV, MD -> LIMA-LAD (for Ostial LAD 95% & multiple sequential 50% lesions.   DORSAL COMPARTMENT RELEASE Left 06/25/2017   Procedure:  LEFT WRIST TENDON SHEATH RELEASE;  Surgeon: Milly Jakob, MD;  Location: [place];  Service: Orthopedics;  Laterality: Left;   EMBOLIZATION N/A 11/21/2018   Procedure: EMBOLIZATION ([place]);  Surgeon: Katha Cabal, MD;  Location: [place] [facility];  Service: Cardiovascular;  Laterality: N/A;   FOOT SURGERY Bilateral    GOUT   KNEE ARTHROSCOPY Bilateral 1986   Right: 1986; left 09/06/2009   LEFT ELBOW SURGERY  AGE 28  &  2007   LEFT HEART CATH AND CORONARY ANGIOGRAPHY  10/19/2003   [facility] (non-STEMI): Ostial LAD 95% with multiple sequential 50% stenoses. => Referred for CABG   NM GATED MYOCARDIAL STUDY (ARMX HX)  07/31/2016   Treadmill Myoview/Cardiolite : [facility]): 4.6 METS, 92 % MPHR.  EF 55 to 60%.  Normal wall motion.  No ischemia or infarction.   OLECRANON BURSA EXCISION Right 06/28/2001   OLECRANON BURSA EXCISION Left 06/24/2007   RADIOACTIVE SEED IMPLANT N/A 08/02/2012   Procedure: RADIOACTIVE SEED IMPLANT;  Surgeon: Claybon Jabs, MD;  Location: [facility];  Service: Urology;  Laterality: N/A;   TOTAL KNEE ARTHROPLASTY Bilateral 04/14/2004   RIGHT 2006; LEFT 11/21/2010   TRANSTHORACIC ECHOCARDIOGRAM  07/31/2016   ([facility]) normal LV function.  Mild LVH.  EF 50%.  Normal RV.  Mild MR, TR and PR.  No stenosis.    Current Outpatient Medications:    allopurinol (ZYLOPRIM) 300 MG tablet, Take 1 tablet (300 mg total) by mouth daily., Disp: 90 tablet, Rfl: 3   aspirin EC 81 MG tablet, Take 81 mg by mouth as needed., Disp: , Rfl:    Iron-FA-B Cmp-C-Biot-Probiotic (FUSION PLUS) CAPS, Take 1 capsule by mouth daily., Disp: 30 capsule, Rfl: 5   nitroGLYCERIN (NITROSTAT) 0.4 MG SL tablet, Place 1 tablet (0.4 mg total) under the tongue every 5 (five) minutes as needed for chest pain., Disp: 30 tablet, Rfl: 0   Potassium 99 MG TABS, Take 297 mg by mouth daily. , Disp: , Rfl:    Sod Picosulfate-Mag Ox-Cit Acd (CLENPIQ) 10-3.5-12  MG-GM -GM/160ML SOLN, Take 320 mLs by mouth once for 1 dose., Disp: 320 mL, Rfl: 0   Family History  Problem Relation Age of Onset   Cerebral aneurysm Mother    Alzheimer's disease Father    Lung cancer Sister 88       treated surgically     Social History   Tobacco Use   Smoking status: Former    Packs/day: 3.00    Years: 31.00    Pack years: 93.00    Types: Cigarettes    Quit date: 08/01/1987    Years since quitting: 34.0   Smokeless tobacco: Never  Vaping Use   Vaping Use: Never used  Substance Use Topics   Alcohol use: Yes    Comment: once a year on NYE   Drug use: No    Allergies as of 08/16/2021 - Review Complete  08/16/2021  Allergen Reaction Noted   Indocin [indomethacin] Other (See Comments) 06/05/2012   Penicillins Itching, Rash, and Other (See Comments) 06/05/2012    Review of Systems:    All systems reviewed and negative except where noted in HPI.   Physical Exam:  BP 120/70 (BP Location: Right Arm, Patient Position: Sitting, Cuff Size: Normal)   Pulse 89   Temp 98.3 F (36.8 C) (Oral)   Ht '5\' 10"'$  (1.778 m)   Wt 220 lb 4 oz (99.9 kg)   BMI 31.60 kg/m  No LMP for male patient.  General:   Alert,  Well-developed, well-nourished, pleasant and cooperative in NAD Head:  Normocephalic and atraumatic. Eyes:  Sclera clear, no icterus.   Conjunctiva pink. Ears:  Normal auditory acuity. Nose:  No deformity, discharge, or lesions. Mouth:  No deformity or lesions,oropharynx pink & moist. Neck:  Supple; no masses or thyromegaly. Lungs:  Respirations even and unlabored.  Clear throughout to auscultation.   No wheezes, crackles, or rhonchi. No acute distress. Heart:  Regular rate and rhythm; no murmurs, clicks, rubs, or gallops. Abdomen:  Normal bowel sounds. Soft, non-tender and non-distended without masses, hepatosplenomegaly or hernias noted.  No guarding or rebound tenderness.   Rectal: Not performed Msk:  Symmetrical without gross deformities. Good, equal  movement & strength bilaterally. Pulses:  Normal pulses noted. Extremities:  No clubbing or edema.  No cyanosis. Neurologic:  Alert and oriented x3;  grossly normal neurologically. Skin:  Intact without significant lesions or rashes. No jaundice. Psych:  Alert and cooperative. Normal mood and affect.  Imaging Studies: Reviewed  Assessment and Plan:   KOLBI ALTADONNA is a 87 y.o. male with history of hypertension, hyperlipidemia, history of CABG with new onset of iron deficiency anemia of unclear etiology, FIT positive  Patient is currently not on oral iron supplements Recommend fusion plus, samples provided and prescription sent Recommend EGD and colonoscopy +/- VCE  I have discussed alternative options, risks & benefits,  which include, but are not limited to, bleeding, infection, perforation,respiratory complication & drug reaction.  The patient agrees with this plan & written consent will be obtained.     Follow up based on the above work-up   Phillip Darby, MD

## 2021-08-19 ENCOUNTER — Encounter: Payer: Self-pay | Admitting: Cardiology

## 2021-08-19 NOTE — Assessment & Plan Note (Addendum)
Has pending colonoscopy on May 23. Should be fine to proceed.  Okay to hold aspirin yesterday.

## 2021-08-19 NOTE — Assessment & Plan Note (Signed)
Normal echo.  I suspect a lot of this is related to deconditioning.  Also potentially some anemia.

## 2021-08-19 NOTE — Assessment & Plan Note (Signed)
LDL does not look that bad for somebody who is not on any medications.  I think with a little low-dose of rosuvastatin we can get him down to a target range.  He is willing to try low-dose rosuvastatin.  We will start with 10 mg daily for a month and then increase to 20 mg if tolerated.  .  Follow-up labs in 4 months.

## 2021-08-19 NOTE — Assessment & Plan Note (Signed)
I do not think that the chest pain he is having is anginal at all I think it is musculoskeletal in nature.  He has had this similar symptom evaluated with negative stress tests. He would be due for Myoview soon.  We talked about it, he is not sure he wants to proceed with a Myoview at this point.  The echocardiogram was relatively normal.  Plan:  Continue aspirin 81 mg daily.  He did [place] and agreed to consider trying rosuvastatin.  We will try starting it 20 mg rosuvastatin and he will take half tablet daily for a month.  Increase to full tablet if tolerated.  His blood pressure and heart rate are pretty stable I would like to have him on a beta-blocker because resting heart rate 94 bpm.  However he does not want a try and if needed will make him fatigued.  Not interested in taking blood pressure and heart rate medicines.  For similar reasons, we will hold off on checking Myoview as he has concerning symptoms.

## 2021-08-24 ENCOUNTER — Other Ambulatory Visit: Payer: Self-pay

## 2021-08-24 ENCOUNTER — Ambulatory Visit: Payer: Medicare Other | Admitting: Anesthesiology

## 2021-08-24 ENCOUNTER — Encounter
Admission: RE | Disposition: A | Discharge status: Home / Self Care | Payer: Self-pay | Source: Home / Self Care | Attending: Gastroenterology

## 2021-08-24 ENCOUNTER — Telehealth: Payer: Self-pay

## 2021-08-24 ENCOUNTER — Ambulatory Visit
Admission: RE | Admit: 2021-08-24 | Discharge: 2021-08-24 | Disposition: A | Discharge status: Home / Self Care | Payer: Medicare Other | Attending: Gastroenterology | Admitting: Gastroenterology

## 2021-08-24 DIAGNOSIS — R195 Other fecal abnormalities: Secondary | ICD-10-CM | POA: Diagnosis not present

## 2021-08-24 DIAGNOSIS — D509 Iron deficiency anemia, unspecified: Secondary | ICD-10-CM | POA: Diagnosis not present

## 2021-08-24 DIAGNOSIS — K649 Unspecified hemorrhoids: Secondary | ICD-10-CM | POA: Diagnosis not present

## 2021-08-24 DIAGNOSIS — K6389 Other specified diseases of intestine: Secondary | ICD-10-CM

## 2021-08-24 DIAGNOSIS — C184 Malignant neoplasm of transverse colon: Secondary | ICD-10-CM

## 2021-08-24 DIAGNOSIS — K5730 Diverticulosis of large intestine without perforation or abscess without bleeding: Secondary | ICD-10-CM | POA: Insufficient documentation

## 2021-08-24 DIAGNOSIS — G4733 Obstructive sleep apnea (adult) (pediatric): Secondary | ICD-10-CM | POA: Diagnosis not present

## 2021-08-24 DIAGNOSIS — C189 Malignant neoplasm of colon, unspecified: Secondary | ICD-10-CM | POA: Diagnosis not present

## 2021-08-24 DIAGNOSIS — K31819 Angiodysplasia of stomach and duodenum without bleeding: Secondary | ICD-10-CM

## 2021-08-24 DIAGNOSIS — K635 Polyp of colon: Secondary | ICD-10-CM | POA: Diagnosis not present

## 2021-08-24 DIAGNOSIS — K644 Residual hemorrhoidal skin tags: Secondary | ICD-10-CM | POA: Insufficient documentation

## 2021-08-24 HISTORY — PX: COLONOSCOPY WITH PROPOFOL: SHX5780

## 2021-08-24 HISTORY — PX: ESOPHAGOGASTRODUODENOSCOPY (EGD) WITH PROPOFOL: SHX5813

## 2021-08-24 HISTORY — DX: Malignant neoplasm of transverse colon: C18.4

## 2021-08-24 SURGERY — COLONOSCOPY WITH PROPOFOL
Anesthesia: General

## 2021-08-24 MED ORDER — PROPOFOL 10 MG/ML IV BOLUS
INTRAVENOUS | Status: DC | PRN
  Administered 2021-08-24: 100 mg via INTRAVENOUS
  Administered 2021-08-24: 100 ug/kg/min via INTRAVENOUS

## 2021-08-24 MED ORDER — LIDOCAINE HCL (CARDIAC) PF 100 MG/5ML IV SOSY
PREFILLED_SYRINGE | INTRAVENOUS | Status: DC | PRN
  Administered 2021-08-24: 100 mg via INTRAVENOUS

## 2021-08-24 MED ORDER — PROPOFOL 1000 MG/100ML IV EMUL
INTRAVENOUS | Status: AC
  Filled 2021-08-24: qty 100

## 2021-08-24 MED ORDER — SODIUM CHLORIDE 0.9 % IV SOLN
INTRAVENOUS | Status: DC
  Administered 2021-08-24: 20 mL/h via INTRAVENOUS

## 2021-08-24 MED ORDER — SPOT INK MARKER SYRINGE KIT
PACK | SUBMUCOSAL | Status: DC | PRN
  Administered 2021-08-24: 2 mL via SUBMUCOSAL

## 2021-08-24 MED ORDER — LIDOCAINE HCL URETHRAL/MUCOSAL 2 % EX GEL
CUTANEOUS | Status: AC
  Filled 2021-08-24: qty 5

## 2021-08-24 NOTE — Op Note (Signed)
[facility] Gastroenterology Patient Name: Phillip Nelson Procedure Date: 08/24/2021 10:34 AM MRN: [id number] Account #: [id number] Date of Birth: [birth year] Admit Type: Outpatient Age: 87 Room: [facility] ENDO ROOM 4 Gender: Male Note Status: Finalized Instrument Name: Upper Endoscope [id number] Procedure:             Upper GI endoscopy Indications:           Unexplained iron deficiency anemia Providers:             Phillip Landsman MD, MD Referring MD:          Phillip Nelson (Referring MD) Medicines:             General Anesthesia Complications:         No immediate complications. Estimated blood loss: None. Procedure:             Pre-Anesthesia Assessment:                        - Prior to the procedure, a History and Physical was                         performed, and patient medications and allergies were                         reviewed. The patient is competent. The risks and                         benefits of the procedure and the sedation options and                         risks were discussed with the patient. All questions                         were answered and informed consent was obtained.                         Patient identification and proposed procedure were                         verified by the physician, the nurse, the                         anesthesiologist, the anesthetist and the technician                         in the pre-procedure area in the procedure room in the                         endoscopy suite. Mental Status Examination: alert and                         oriented. Airway Examination: normal oropharyngeal                         airway and neck mobility. Respiratory Examination:                         clear to auscultation. CV Examination: normal.  Prophylactic Antibiotics: The patient does not require                         prophylactic antibiotics. Prior Anticoagulants: The                          patient has taken no previous anticoagulant or                         antiplatelet agents. ASA Grade Assessment: III - A                         patient with severe systemic disease. After reviewing                         the risks and benefits, the patient was deemed in                         satisfactory condition to undergo the procedure. The                         anesthesia plan was to use general anesthesia.                         Immediately prior to administration of medications,                         the patient was re-assessed for adequacy to receive                         sedatives. The heart rate, respiratory rate, oxygen                         saturations, blood pressure, adequacy of pulmonary                         ventilation, and response to care were monitored                         throughout the procedure. The physical status of the                         patient was re-assessed after the procedure.                        After obtaining informed consent, the endoscope was                         passed under direct vision. Throughout the procedure,                         the patient's blood pressure, pulse, and oxygen                         saturations were monitored continuously. The Endoscope                         was introduced through the mouth, and advanced to the  second part of duodenum. The upper GI endoscopy was                         accomplished without difficulty. The patient tolerated                         the procedure well. Findings:      Two small angioectasias without bleeding were found in the second       portion of the duodenum. Coagulation for hemostasis using argon plasma       at 0.8 liters/minute and 60 watts was successful. Estimated blood loss:       none.      The entire examined stomach was normal. Biopsies were taken with a cold       forceps for Helicobacter pylori testing.      The cardia and  gastric fundus were normal on retroflexion.      The gastroesophageal junction and examined esophagus were normal. Impression:            - Two non-bleeding angioectasias in the duodenum.                         Treated with argon plasma coagulation (APC).                        - Normal stomach. Biopsied.                        - Normal gastroesophageal junction and esophagus. Recommendation:        - Await pathology results.                        - Proceed with colonoscopy as scheduled                        See colonoscopy report Procedure Code(s):     --- Professional ---                        38756, 2, Esophagogastroduodenoscopy, flexible,                         transoral; with control of bleeding, any method                        43239, Esophagogastroduodenoscopy, flexible,                         transoral; with biopsy, single or multiple Diagnosis Code(s):     --- Professional ---                        E33.295, Angiodysplasia of stomach and duodenum                         without bleeding                        D50.9, Iron deficiency anemia, unspecified CPT copyright 2019 American Medical Association. All rights reserved. The codes documented in this report are preliminary and upon coder review may  be revised to meet current compliance requirements. Dr. Ulyess Mort Tally Nelson  Phillip Ditch MD, MD 08/24/2021 10:57:17 AM This report has been signed electronically. Number of Addenda: 0 Note Initiated On: 08/24/2021 10:34 AM Estimated Blood Loss:  Estimated blood loss: none.      [facility]

## 2021-08-24 NOTE — Anesthesia Preprocedure Evaluation (Signed)
Anesthesia Evaluation  Patient identified by MRN, date of birth, ID band Patient awake    Reviewed: Allergy & Precautions, NPO status , Patient's Chart, lab work & pertinent test results  History of Anesthesia Complications Negative for: history of anesthetic complications  Airway Mallampati: II  TM Distance: >3 FB Neck ROM: Full    Dental no notable dental hx. (+) Upper Dentures   Pulmonary sleep apnea , neg COPD, Patient abstained from smoking.Not current smoker, former smoker,    Pulmonary exam normal breath sounds clear to auscultation       Cardiovascular Exercise Tolerance: Good METShypertension, + angina + CAD  (-) Past MI Normal cardiovascular exam(-) dysrhythmias  Rhythm:Regular Rate:Normal - Systolic murmurs    Neuro/Psych  Headaches, negative psych ROS   GI/Hepatic Neg liver ROS, PUD, neg GERD  ,  Endo/Other  negative endocrine ROSneg diabetes  Renal/GU negative Renal ROS  negative genitourinary   Musculoskeletal negative musculoskeletal ROS (+)   Abdominal   Peds negative pediatric ROS (+)  Hematology negative hematology ROS (+)   Anesthesia Other Findings Past Medical History: No date: Aural vertigo 12/31/2006: Basal cell carcinoma     Comment:  L mid infraclavicular. Excised 02/27/2007, margins free. 10/30/2016: Basal cell carcinoma     Comment:   R post shoulder. Nodular pattern. 03/31/2020: Basal cell carcinoma     Comment:  L lat pretibial - ED&C  No date: Colon polyps 09/2003: Coronary artery disease involving native coronary artery of  native heart with other form of angina pectoris ([place])     Comment:  [facility]-Dr. Nehemiah Massed- transitioned to Dr. [name]              [facility] 06/2021: -- 09/2003: Non-STEMI (Sx= heaviness in               chest ~100lb brick, no SOB) = Cath w/ 95% ostial LAD with              diffuse 50% stenosis -> referred for CABG x1 (LIMA-LAD);                Non-Ischemic Cardiolite 07/2016 No date: Diverticulitis No date: ED (erectile dysfunction) No date: Frequent headaches No date: Gout, arthritis No date: History of skin cancer No date: Hyperlipidemia No date: OSA (obstructive sleep apnea)     Comment:  CPAP NON-COMPLIANT 05/08/2012: Prostate cancer ([place])     Comment:  Adenocarcinoma,gleason=3+3=6,PSA=8.26,vol=57cc 10/19/2003: S/P CABG x 1     Comment:  [facility];Tobey Grim, IV, MD: LIMA-LAD No date: Squamous cell carcinoma, arm, left     Comment:  L forearm prox dorsum  No date: Squamous cell carcinoma, arm, left     Comment:  L forearm prox medial  08/23/2017: Squamous cell carcinoma, arm, right     Comment:  R mid volar forearm  Reproductive/Obstetrics negative OB ROS                             Anesthesia Physical  Anesthesia Plan  ASA: 3  Anesthesia Plan: General   Post-op Pain Management: Minimal or no pain anticipated   Induction: Intravenous  PONV Risk Score and Plan: 2 and Ondansetron, Propofol infusion and TIVA  Airway Management Planned: Simple Face Mask and Natural Airway  Additional Equipment: None  Intra-op Plan:   Post-operative Plan:   Informed Consent: I have reviewed the patients History and Physical, chart, labs and discussed the procedure including the risks, benefits and  alternatives for the proposed anesthesia with the patient or authorized representative who has indicated his/her understanding and acceptance.     Dental advisory given  Plan Discussed with: CRNA and Surgeon  Anesthesia Plan Comments: (Discussed risks of anesthesia with patient, including possibility of difficulty with spontaneous ventilation under anesthesia necessitating airway intervention, PONV, and rare risks such as cardiac or respiratory or neurological events, and allergic reactions. Discussed the role of CRNA in patient's perioperative care. Patient understands.)        Anesthesia  Quick Evaluation

## 2021-08-24 NOTE — Telephone Encounter (Signed)
Placed referral  

## 2021-08-24 NOTE — Anesthesia Postprocedure Evaluation (Signed)
Anesthesia Post Note  Patient: RUAL VERMEER  Procedure(s) Performed: COLONOSCOPY WITH PROPOFOL ESOPHAGOGASTRODUODENOSCOPY (EGD) WITH PROPOFOL  Patient location during evaluation: Endoscopy Anesthesia Type: General Level of consciousness: awake and alert Pain management: pain level controlled Vital Signs Assessment: post-procedure vital signs reviewed and stable Respiratory status: spontaneous breathing, nonlabored ventilation, respiratory function stable and patient connected to nasal cannula oxygen Cardiovascular status: blood pressure returned to baseline and stable Postop Assessment: no apparent nausea or vomiting Anesthetic complications: no   No notable events documented.   Last Vitals:  Vitals:   08/24/21 1146 08/24/21 1156  BP: (!) 112/91 117/79  Pulse: 68 78  Resp: 14 14  Temp:    SpO2: 100% 99%    Last Pain:  Vitals:   08/24/21 1156  TempSrc:   PainSc: 0-No pain                 Arita Miss

## 2021-08-24 NOTE — Telephone Encounter (Signed)
-----   Message from Lin Landsman, MD sent at 08/24/2021 12:15 PM EDT ----- Regarding: Referral Please refer him to [facility] oncology Dx: Colon mass, path pending  RV

## 2021-08-24 NOTE — H&P (Signed)
[name], MD [address]  [place]  [place], [place] [address]  Main: [phone]  Fax: [phone] Pager: [phone]  Primary Care Physician:  [name], MD Primary Gastroenterologist:  Dr. [name]  Pre-Procedure History & Physical: HPI:  Phillip Nelson is a 87 y.o. male is here for an endoscopy and colonoscopy.   Past Medical History:  Diagnosis Date   Aural vertigo    Basal cell carcinoma 12/31/2006   L mid infraclavicular. Excised 02/27/2007, margins free.   Basal cell carcinoma 10/30/2016    R post shoulder. Nodular pattern.   Basal cell carcinoma 03/31/2020   L lat pretibial - ED&C    Colon polyps    Coronary artery disease involving native coronary artery of native heart with other form of angina pectoris ([place]) 09/2003   [facility]-Dr. Nehemiah Massed- transitioned to Dr. [name] [facility] 06/2021: -- 09/2003: Non-STEMI (Sx= heaviness in chest ~100lb brick, no SOB) = Cath w/ 95% ostial LAD with diffuse 50% stenosis -> referred for CABG x1 (LIMA-LAD); Non-Ischemic Cardiolite 07/2016   Diverticulitis    ED (erectile dysfunction)    Frequent headaches    Gout, arthritis    History of skin cancer    Hyperlipidemia    OSA (obstructive sleep apnea)    CPAP NON-COMPLIANT   Prostate cancer ([place]) 05/08/2012   Adenocarcinoma,gleason=3+3=6,PSA=8.26,vol=57cc   S/P CABG x 1 10/19/2003   [facility];Phillip Grim, IV, MD: LIMA-LAD   Squamous cell carcinoma, arm, left    L forearm prox dorsum    Squamous cell carcinoma, arm, left    L forearm prox medial    Squamous cell carcinoma, arm, right 08/23/2017   R mid volar forearm    Past Surgical History:  Procedure Laterality Date   APPENDECTOMY  1999   Life was saved with emerg. surgery.    BILATERAL CARPAL TUNNEL RELEASE Right 06/28/2001   Right (endoscopic)   CARPECTOMY Right    wrist   CATARACT EXTRACTION W/ INTRAOCULAR LENS  IMPLANT, BILATERAL     COLONOSCOPY WITH PROPOFOL N/A 10/31/2016    Procedure: COLONOSCOPY WITH PROPOFOL;  Surgeon: Christene Lye, MD;  Location: [facility] ENDOSCOPY;  Service: Endoscopy;  Laterality: N/A;   CORONARY ARTERY BYPASS GRAFT  10/19/2003   [facility], Phillip Grim, IV, MD -> LIMA-LAD (for Ostial LAD 95% & multiple sequential 50% lesions.   DORSAL COMPARTMENT RELEASE Left 06/25/2017   Procedure: LEFT WRIST TENDON SHEATH RELEASE;  Surgeon: Milly Jakob, MD;  Location: [facility];  Service: Orthopedics;  Laterality: Left;   EMBOLIZATION N/A 11/21/2018   Procedure: EMBOLIZATION (Colonic);  Surgeon: Katha Cabal, MD;  Location: [place] [facility];  Service: Cardiovascular;  Laterality: N/A;   FOOT SURGERY Bilateral    GOUT   KNEE ARTHROSCOPY Bilateral 1986   Right: 1986; left 09/06/2009   LEFT ELBOW SURGERY  AGE 14  &  2007   LEFT HEART CATH AND CORONARY ANGIOGRAPHY  10/19/2003   [facility] (non-STEMI): Ostial LAD 95% with multiple sequential 50% stenoses. => Referred for CABG   NM GATED MYOCARDIAL STUDY (ARMX HX)  07/31/2016   Treadmill Myoview/Cardiolite : [facility]): 4.6 METS, 92 % MPHR.  EF 55 to 60%.  Normal wall motion.  No ischemia or infarction.   OLECRANON BURSA EXCISION Right 06/28/2001   OLECRANON BURSA EXCISION Left 06/24/2007   RADIOACTIVE SEED IMPLANT N/A 08/02/2012   Procedure: RADIOACTIVE SEED IMPLANT;  Surgeon: Claybon Jabs, MD;  Location: [facility];  Service: Urology;  Laterality: N/A;   TOTAL KNEE ARTHROPLASTY Bilateral 04/14/2004   RIGHT 2006; LEFT 11/21/2010   TRANSTHORACIC ECHOCARDIOGRAM  07/31/2016   ([facility]) normal LV function.  Mild LVH.  EF 50%.  Normal RV.  Mild MR, TR and PR.  No stenosis.    Prior to Admission medications   Medication Sig Start Date End Date Taking? Authorizing Provider  allopurinol (ZYLOPRIM) 300 MG tablet Take 1 tablet (300 mg total) by mouth daily. 07/12/21  Yes [name], MD  aspirin EC 81 MG tablet Take 81 mg by mouth as  needed.   Yes [provider]  Iron-FA-B Cmp-C-Biot-Probiotic (FUSION PLUS) CAPS Take 1 capsule by mouth daily. 08/16/21  Yes Beryle Zeitz, Tally Due, MD  nitroGLYCERIN (NITROSTAT) 0.4 MG SL tablet Place 1 tablet (0.4 mg total) under the tongue every 5 (five) minutes as needed for chest pain. 07/12/21  Yes [name], MD  Potassium 99 MG TABS Take 297 mg by mouth daily.    Yes [provider]    Allergies as of 08/16/2021 - Review Complete 08/16/2021  Allergen Reaction Noted   Indocin [indomethacin] Other (See Comments) 06/05/2012   Penicillins Itching, Rash, and Other (See Comments) 06/05/2012    Family History  Problem Relation Age of Onset   Cerebral aneurysm Mother    Alzheimer's disease Father    Lung cancer Sister 68       treated surgically    Social History   Socioeconomic History   Marital status: Soil scientist    Spouse name: Blanch Media   Number of children: 1   Years of education: Not on file   Highest education level: Some college, no degree  Occupational History   Occupation: Retired  Tobacco Use   Smoking status: Former    Packs/day: 3.00    Years: 31.00    Pack years: 93.00    Types: Cigarettes    Quit date: 08/01/1987    Years since quitting: 34.0   Smokeless tobacco: Never  Vaping Use   Vaping Use: Never used  Substance and Sexual Activity   Alcohol use: Yes    Comment: once a year on NYE   Drug use: No   Sexual activity: Not Currently  Other Topics Concern   Not on file  Social History Narrative   09/22/19   From: the area   Living: with partner - Bunnie Philips (dementia) 1984   Work: retired - Investment banker, 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  He does all of the housework including cooking, cleaning, laundry, vacuuming and yard work.      Safety   Seat  belts: Yes    Guns: Yes  and secure   Safe in relationships: Yes    Social Determinants of Health   Financial Resource Strain: Low Risk    Difficulty of Paying Living Expenses: Not hard at all  Food Insecurity: No Food Insecurity   Worried About Charity fundraiser in the Last Year: Never true   Ran Out of Food in the Last Year: Never true  Transportation Needs: No Transportation Needs   Lack of Transportation (Medical): No   Lack of Transportation (Non-Medical): No  Physical Activity:  Inactive   Days of Exercise per Week: 0 days   Minutes of Exercise per Session: 0 min  Stress: No Stress Concern Present   Feeling of Stress : Not at all  Social Connections: Not on file  Intimate Partner Violence: Not At Risk   Fear of Current or Ex-Partner: No   Emotionally Abused: No   Physically Abused: No   Sexually Abused: No    Review of Systems: See HPI, otherwise negative ROS  Physical Exam: BP 132/82   Pulse 93   Temp (!) 96.2 F (35.7 C) (Temporal)   Resp 20   Ht 5' 10.5" (1.791 m)   Wt 97.1 kg   SpO2 100%   BMI 30.27 kg/m  General:   Alert,  pleasant and cooperative in NAD Head:  Normocephalic and atraumatic. Neck:  Supple; no masses or thyromegaly. Lungs:  Clear throughout to auscultation.    Heart:  Regular rate and rhythm. Abdomen:  Soft, nontender and nondistended. Normal bowel sounds, without guarding, and without rebound.   Neurologic:  Alert and  oriented x4;  grossly normal neurologically.  Impression/Plan: Phillip Nelson is here for an endoscopy and colonoscopy to be performed for new onset of iron deficiency anemia of unclear etiology, FIT positive  Risks, benefits, limitations, and alternatives regarding  endoscopy and colonoscopy have been reviewed with the patient.  Questions have been answered.  All parties agreeable.   Sherri Sear, MD  08/24/2021, 10:25 AM

## 2021-08-24 NOTE — Op Note (Signed)
[facility] Gastroenterology Patient Name: Phillip Nelson Procedure Date: 08/24/2021 10:33 AM MRN: [id number] Account #: [id number] Date of Birth: 04/26/1934 Admit Type: Outpatient Age: 87 Room: [facility] ENDO ROOM 4 Gender: Male Note Status: Finalized Instrument Name: Colonscope [id number] Procedure:             Colonoscopy Indications:           Last colonoscopy: August 2018, Unexplained iron                         deficiency anemia, Positive fecal immunochemical test Providers:             Lin Landsman MD, MD Referring MD:          Jobe Marker. Einar Pheasant (Referring MD) Medicines:             General Anesthesia Complications:         No immediate complications. Estimated blood loss: None. Procedure:             Pre-Anesthesia Assessment:                        - Prior to the procedure, a History and Physical was                         performed, and patient medications and allergies were                         reviewed. The patient is competent. The risks and                         benefits of the procedure and the sedation options and                         risks were discussed with the patient. All questions                         were answered and informed consent was obtained.                         Patient identification and proposed procedure were                         verified by the physician, the nurse, the                         anesthesiologist, the anesthetist and the technician                         in the pre-procedure area in the procedure room in the                         endoscopy suite. Mental Status Examination: alert and                         oriented. Airway Examination: normal oropharyngeal                         airway and neck mobility. Respiratory Examination:  clear to auscultation. CV Examination: normal.                         Prophylactic Antibiotics: The patient does not require                          prophylactic antibiotics. Prior Anticoagulants: The                         patient has taken no previous anticoagulant or                         antiplatelet agents. ASA Grade Assessment: III - A                         patient with severe systemic disease. After reviewing                         the risks and benefits, the patient was deemed in                         satisfactory condition to undergo the procedure. The                         anesthesia plan was to use general anesthesia.                         Immediately prior to administration of medications,                         the patient was re-assessed for adequacy to receive                         sedatives. The heart rate, respiratory rate, oxygen                         saturations, blood pressure, adequacy of pulmonary                         ventilation, and response to care were monitored                         throughout the procedure. The physical status of the                         patient was re-assessed after the procedure.                        After obtaining informed consent, the colonoscope was                         passed under direct vision. Throughout the procedure,                         the patient's blood pressure, pulse, and oxygen                         saturations were monitored continuously. The  Colonoscope was introduced through the anus and                         advanced to the the cecum, identified by appendiceal                         orifice and ileocecal valve. The colonoscopy was                         performed without difficulty. The patient tolerated                         the procedure well. The quality of the bowel                         preparation was good. Findings:      The perianal and digital rectal examinations were normal. Pertinent       negatives include normal sphincter tone and no palpable rectal lesions.      A frond-like/villous,  fungating and ulcerated non-obstructing large mass       was found in the transverse colon. The mass was partially       circumferential (involving two-thirds of the lumen circumference). No       bleeding was present. Biopsies were taken with a cold forceps for       histology. Area was tattooed with an injection of Spot (carbon black).      A 5 mm polyp was found in the transverse colon. The polyp was sessile.       The polyp was removed with a cold snare. Resection and retrieval were       complete. Estimated blood loss: none.      Many diverticula were found in the recto-sigmoid colon and sigmoid colon.      Non-bleeding external hemorrhoids were found during retroflexion. The       hemorrhoids were large. Impression:            - Likely malignant tumor in the transverse colon.                         Biopsied. Tattooed.                        - One 5 mm polyp in the transverse colon, removed with                         a cold snare. Resected and retrieved.                        - Diverticulosis in the recto-sigmoid colon and in the                         sigmoid colon.                        - Non-bleeding external hemorrhoids. Recommendation:        - Discharge patient to home (with escort).                        - Resume previous diet today.                        -  Continue present medications.                        - Await pathology results.                        - Refer to an oncologist at appointment to be                         scheduled. Procedure Code(s):     --- Professional ---                        [phone], Colonoscopy, flexible; with removal of                         tumor(s), polyp(s), or other lesion(s) by snare                         technique                        45381, Colonoscopy, flexible; with directed submucosal                         injection(s), any substance                        57322, 24, Colonoscopy, flexible; with biopsy, single                          or multiple Diagnosis Code(s):     --- Professional ---                        D49.0, Neoplasm of unspecified behavior of digestive                         system                        K63.5, Polyp of colon                        K64.4, Residual hemorrhoidal skin tags                        D50.9, Iron deficiency anemia, unspecified                        R19.5, Other fecal abnormalities                        K57.30, Diverticulosis of large intestine without                         perforation or abscess without bleeding CPT copyright 2019 American Medical Association. All rights reserved. The codes documented in this report are preliminary and upon coder review may  be revised to meet current compliance requirements. Dr. Ulyess Mort Lin Landsman MD, MD 08/24/2021 11:27:23 AM This report has been signed electronically. Number of Addenda: 0 Note Initiated On: 08/24/2021 10:33 AM Scope Withdrawal Time: 0 hours 13 minutes 19 seconds  Total Procedure Duration: 0 hours 20 minutes  49 seconds  Estimated Blood Loss:  Estimated blood loss: none.      [facility]

## 2021-08-24 NOTE — Transfer of Care (Signed)
Immediate Anesthesia Transfer of Care Note  Patient: Phillip Nelson  Procedure(s) Performed: COLONOSCOPY WITH PROPOFOL ESOPHAGOGASTRODUODENOSCOPY (EGD) WITH PROPOFOL  Patient Location: PACU  Anesthesia Type:General  Level of Consciousness: drowsy  Airway & Oxygen Therapy: Patient Spontanous Breathing  Post-op Assessment: Report given to RN and Post -op Vital signs reviewed and stable  Post vital signs: Reviewed and stable  Last Vitals:  Vitals Value Taken Time  BP 114/66 1124  Temp 35.8 1124  Pulse 72 1124  Resp 16 1124  SpO2 98 1124    Last Pain:  Vitals:   08/24/21 0927  TempSrc: Temporal  PainSc: 0-No pain         Complications: No notable events documented.

## 2021-08-25 ENCOUNTER — Encounter: Payer: Self-pay | Admitting: Gastroenterology

## 2021-08-25 ENCOUNTER — Telehealth: Payer: Self-pay | Admitting: Gastroenterology

## 2021-08-25 ENCOUNTER — Other Ambulatory Visit: Payer: Self-pay | Admitting: Pathology

## 2021-08-25 LAB — SURGICAL PATHOLOGY

## 2021-08-25 NOTE — Telephone Encounter (Signed)
I have called patient to discuss about pathology results and informed him that the colon mass confirmed invasive adenocarcinoma.  He has an appointment to see Dr. Tasia Catchings on 6/7 and patient acknowledged the pathology results and his upcoming appointment with oncology  Sherri Sear, MD

## 2021-08-26 NOTE — Telephone Encounter (Signed)
Appreciate the update and care of my patient.

## 2021-08-31 ENCOUNTER — Encounter: Payer: Self-pay | Admitting: Oncology

## 2021-08-31 ENCOUNTER — Inpatient Hospital Stay: Payer: Medicare Other

## 2021-08-31 ENCOUNTER — Other Ambulatory Visit: Payer: Self-pay

## 2021-08-31 ENCOUNTER — Inpatient Hospital Stay: Payer: Medicare Other | Attending: Oncology | Admitting: Oncology

## 2021-08-31 VITALS — BP 133/65 | HR 83 | Temp 96.9°F | Resp 18 | Wt 219.6 lb

## 2021-08-31 DIAGNOSIS — Z7189 Other specified counseling: Secondary | ICD-10-CM | POA: Diagnosis not present

## 2021-08-31 DIAGNOSIS — Z8546 Personal history of malignant neoplasm of prostate: Secondary | ICD-10-CM | POA: Insufficient documentation

## 2021-08-31 DIAGNOSIS — D72829 Elevated white blood cell count, unspecified: Secondary | ICD-10-CM | POA: Insufficient documentation

## 2021-08-31 DIAGNOSIS — C184 Malignant neoplasm of transverse colon: Secondary | ICD-10-CM | POA: Diagnosis not present

## 2021-08-31 DIAGNOSIS — K6389 Other specified diseases of intestine: Secondary | ICD-10-CM

## 2021-08-31 DIAGNOSIS — C189 Malignant neoplasm of colon, unspecified: Secondary | ICD-10-CM

## 2021-08-31 DIAGNOSIS — Z87891 Personal history of nicotine dependence: Secondary | ICD-10-CM | POA: Diagnosis not present

## 2021-08-31 DIAGNOSIS — D649 Anemia, unspecified: Secondary | ICD-10-CM

## 2021-08-31 DIAGNOSIS — D500 Iron deficiency anemia secondary to blood loss (chronic): Secondary | ICD-10-CM | POA: Diagnosis not present

## 2021-08-31 HISTORY — DX: Other specified counseling: Z71.89

## 2021-08-31 LAB — COMPREHENSIVE METABOLIC PANEL
ALT: 21 U/L (ref 0–44)
AST: 28 U/L (ref 15–41)
Albumin: 4.2 g/dL (ref 3.5–5.0)
Alkaline Phosphatase: 64 U/L (ref 38–126)
Anion gap: 10 (ref 5–15)
BUN: 17 mg/dL (ref 8–23)
CO2: 23 mmol/L (ref 22–32)
Calcium: 8.8 mg/dL — ABNORMAL LOW (ref 8.9–10.3)
Chloride: 103 mmol/L (ref 98–111)
Creatinine, Ser: 1.04 mg/dL (ref 0.61–1.24)
GFR, Estimated: >60 mL/min (ref >60)
Glucose, Bld: 99 mg/dL (ref 70–99)
Potassium: 4.3 mmol/L (ref 3.5–5.1)
Sodium: 136 mmol/L (ref 135–145)
Total Bilirubin: 0.7 mg/dL (ref 0.3–1.2)
Total Protein: 7.8 g/dL (ref 6.5–8.1)

## 2021-08-31 LAB — CBC WITH DIFFERENTIAL/PLATELET
Abs Immature Granulocytes: 0.07 10*3/uL (ref 0.00–0.07)
Basophils Absolute: 0.1 10*3/uL (ref 0.0–0.1)
Basophils Relative: 1 %
Eosinophils Absolute: 0.3 10*3/uL (ref 0.0–0.5)
Eosinophils Relative: 3 %
HCT: 36.3 % — ABNORMAL LOW (ref 39.0–52.0)
Hemoglobin: 10.8 g/dL — ABNORMAL LOW (ref 13.0–17.0)
Immature Granulocytes: 1 %
Lymphocytes Relative: 24 %
Lymphs Abs: 2.7 10*3/uL (ref 0.7–4.0)
MCH: 25.1 pg — ABNORMAL LOW (ref 26.0–34.0)
MCHC: 29.8 g/dL — ABNORMAL LOW (ref 30.0–36.0)
MCV: 84.4 fL (ref 80.0–100.0)
Monocytes Absolute: 0.8 10*3/uL (ref 0.1–1.0)
Monocytes Relative: 7 %
Neutro Abs: 7.3 10*3/uL (ref 1.7–7.7)
Neutrophils Relative %: 64 %
Platelets: 407 10*3/uL — ABNORMAL HIGH (ref 150–400)
RBC: 4.3 MIL/uL (ref 4.22–5.81)
RDW: 20.8 % — ABNORMAL HIGH (ref 11.5–15.5)
WBC: 11.2 10*3/uL — ABNORMAL HIGH (ref 4.0–10.5)
nRBC: 0 % (ref 0.0–0.2)

## 2021-08-31 LAB — FERRITIN: Ferritin: 52 ng/mL (ref 24–336)

## 2021-08-31 LAB — IRON AND TIBC
Iron: 43 ug/dL — ABNORMAL LOW (ref 45–182)
Saturation Ratios: 9 % — ABNORMAL LOW (ref 17.9–39.5)
TIBC: 473 ug/dL — ABNORMAL HIGH (ref 250–450)
UIBC: 430 ug/dL

## 2021-08-31 NOTE — Progress Notes (Signed)
Hematology/Oncology Progress note [phone] [phone]      Patient Care Team: [name], MD as PCP - General (Family Medicine) Beverly Gust, MD (Otolaryngology) Ralene Bathe, MD (Dermatology) Corey Skains, MD as Consulting Physician (Cardiology) Birder Robson, MD (Ophthalmology)  REFERRING PROVIDER: [name], MD  CHIEF COMPLAINTS/REASON FOR VISIT:  Reestablish care for colon cancer.  HISTORY OF PRESENTING ILLNESS:  Phillip Nelson is a  87 y.o.  male with PMH listed below who was referred to me for evaluation of leukocytosis Reviewed patient' recent labs obtained by PCP.   09/22/2019 CBC showed elevated white count of 12.4, predominantly neutrophilia Previous lab records reviewed. Leukocytosis onset of chronic, duration is since at least 2014.  No aggravating or elevated factors. Associated symptoms or signs:  Denies weight loss, fever, chills,night sweats.  Fatigue Smoking history: Former smoker, he quitted in 1989 History of recent oral steroid use or steroid injection: Denies History of recent infection: Denies Autoimmune disease history.  Denies  Patient has a history of prostate cancer, diagnosed in 2014, Gleason score 3+3, PSA was 8 at the time of diagnosis.  Per patient, he has had reactive seed implant in 2014.Marland Kitchen  He follows up with dermatologist for squamous cell carcinoma and precancer lesions. He denies any chronic wound or infection.  Denies any constitutional symptoms. Reports chronic cough due to postnasal drip, productive for clear phlegm.   Leukocytosis Patient had no monoclonal protein on protein electrophoresis, he has polyclonal increase detected in 1 or more immunoglobulins, compatible with chronic inflammation.  He has slightly increased free light chain ratio.  I will check urine protein electrophoresis.  LDH is normal, negative hepatitis panel negative HIV.  Repeat CBC 2 weeks ago showed normalization  of total white count. Flow cytometry showed negative immunophenotypic abnormality  INTERVAL HISTORY Phillip Nelson is a 87 y.o. male who has above history reviewed by me today presents to reestablish care for colon cancer management. Patient has had anemia with a hemoglobin decreased from 14 to 10's.  Patient had stool study done which showed positive for blood. 08/24/2021, colonoscopy by Dr. [name] showed transverse colon mass biopsied.  Mass was partially circumferential.  5 mm polyp was found in the transverse colon.  Polyp was resected and retrieved. Colon mass biopsy showed invasive colorectal adenocarcinoma, moderately differentiated.  Colon polyp showed tubular adenoma.  Fragments of colonic mucosa with intramucosal adenocarcinoma, morphologically similar to colon mass. Clinical and endoscopic correlation are required to determine whether  the fragments of intramucosal adenocarcinoma seen in the transverse  colon polyp specimen could be a contaminant from the transverse colon  mass biopsy.  Patient denies noticing black stool or blood in the stool.  Denies nausea vomiting diarrhea, unintentional weight loss.    Review of Systems  Constitutional:  Negative for appetite change, chills, fatigue, fever and unexpected weight change.  HENT:   Negative for hearing loss and voice change.        Sinus drainage  Eyes:  Negative for eye problems and icterus.  Respiratory:  Negative for chest tightness, cough and shortness of breath.   Cardiovascular:  Negative for chest pain and leg swelling.  Gastrointestinal:  Negative for abdominal distention and abdominal pain.  Endocrine: Negative for hot flashes.  Genitourinary:  Negative for difficulty urinating, dysuria and frequency.   Musculoskeletal:  Negative for arthralgias.  Skin:  Negative for itching and rash.  Neurological:  Negative for light-headedness and numbness.  Hematological:  Negative for adenopathy.  Does not bruise/bleed easily.   Psychiatric/Behavioral:  Negative for confusion.     MEDICAL HISTORY:  Past Medical History:  Diagnosis Date   Aural vertigo    Basal cell carcinoma 12/31/2006   L mid infraclavicular. Excised 02/27/2007, margins free.   Basal cell carcinoma 10/30/2016    R post shoulder. Nodular pattern.   Basal cell carcinoma 03/31/2020   L lat pretibial - ED&C    Colon polyps    Coronary artery disease involving native coronary artery of native heart with other form of angina pectoris ([place]) 09/2003   [facility]-Dr. Nehemiah Massed- transitioned to Dr. [name] [facility] 06/2021: -- 09/2003: Non-STEMI (Sx= heaviness in chest ~100lb brick, no SOB) = Cath w/ 95% ostial LAD with diffuse 50% stenosis -> referred for CABG x1 (LIMA-LAD); Non-Ischemic Cardiolite 07/2016   Diverticulitis    ED (erectile dysfunction)    Frequent headaches    Gout, arthritis    History of skin cancer    Hyperlipidemia    OSA (obstructive sleep apnea)    CPAP NON-COMPLIANT   Prostate cancer ([place]) 05/08/2012   Adenocarcinoma,gleason=3+3=6,PSA=8.26,vol=57cc   S/P CABG x 1 10/19/2003   [facility];Tobey Grim, IV, MD: LIMA-LAD   Squamous cell carcinoma, arm, left    L forearm prox dorsum    Squamous cell carcinoma, arm, left    L forearm prox medial    Squamous cell carcinoma, arm, right 08/23/2017   R mid volar forearm    SURGICAL HISTORY: Past Surgical History:  Procedure Laterality Date   APPENDECTOMY  1999   Life was saved with emerg. surgery.    BILATERAL CARPAL TUNNEL RELEASE Right 06/28/2001   Right (endoscopic)   CARPECTOMY Right    wrist   CATARACT EXTRACTION W/ INTRAOCULAR LENS  IMPLANT, BILATERAL     COLONOSCOPY WITH PROPOFOL N/A 10/31/2016   Procedure: COLONOSCOPY WITH PROPOFOL;  Surgeon: Christene Lye, MD;  Location: [facility] ENDOSCOPY;  Service: Endoscopy;  Laterality: N/A;   COLONOSCOPY WITH PROPOFOL N/A 08/24/2021   Procedure: COLONOSCOPY WITH PROPOFOL;  Surgeon: Lin Landsman, MD;  Location:  [facility] ENDOSCOPY;  Service: Gastroenterology;  Laterality: N/A;   CORONARY ARTERY BYPASS GRAFT  10/19/2003   [facility], Tobey Grim, IV, MD -> LIMA-LAD (for Ostial LAD 95% & multiple sequential 50% lesions.   DORSAL COMPARTMENT RELEASE Left 06/25/2017   Procedure: LEFT WRIST TENDON SHEATH RELEASE;  Surgeon: Milly Jakob, MD;  Location: [facility];  Service: Orthopedics;  Laterality: Left;   EMBOLIZATION N/A 11/21/2018   Procedure: EMBOLIZATION (Colonic);  Surgeon: Katha Cabal, MD;  Location: [facility];  Service: Cardiovascular;  Laterality: N/A;   ESOPHAGOGASTRODUODENOSCOPY (EGD) WITH PROPOFOL N/A 08/24/2021   Procedure: ESOPHAGOGASTRODUODENOSCOPY (EGD) WITH PROPOFOL;  Surgeon: Lin Landsman, MD;  Location: [facility] ENDOSCOPY;  Service: Gastroenterology;  Laterality: N/A;   FOOT SURGERY Bilateral    GOUT   KNEE ARTHROSCOPY Bilateral 1986   Right: 1986; left 09/06/2009   LEFT ELBOW SURGERY  AGE 59  &  2007   LEFT HEART CATH AND CORONARY ANGIOGRAPHY  10/19/2003   [facility] (non-STEMI): Ostial LAD 95% with multiple sequential 50% stenoses. => Referred for CABG   NM GATED MYOCARDIAL STUDY (ARMX HX)  07/31/2016   Treadmill Myoview/Cardiolite : [facility]): 4.6 METS, 92 % MPHR.  EF 55 to 60%.  Normal wall motion.  No ischemia or infarction.   OLECRANON BURSA EXCISION Right 06/28/2001   OLECRANON BURSA EXCISION Left 06/24/2007   RADIOACTIVE SEED IMPLANT N/A 08/02/2012  Procedure: RADIOACTIVE SEED IMPLANT;  Surgeon: Claybon Jabs, MD;  Location: [facility];  Service: Urology;  Laterality: N/A;   TOTAL KNEE ARTHROPLASTY Bilateral 04/14/2004   RIGHT 2006; LEFT 11/21/2010   TRANSTHORACIC ECHOCARDIOGRAM  07/31/2016   ([facility]) normal LV function.  Mild LVH.  EF 50%.  Normal RV.  Mild MR, TR and PR.  No stenosis.    SOCIAL HISTORY: Social History   Socioeconomic History   Marital status: Soil scientist    Spouse name:  Blanch Media   Number of children: 1   Years of education: Not on file   Highest education level: Some college, no degree  Occupational History   Occupation: Retired  Tobacco Use   Smoking status: Former    Packs/day: 3.00    Years: 31.00    Pack years: 93.00    Types: Cigarettes    Quit date: 08/01/1987    Years since quitting: 34.1   Smokeless tobacco: Never  Vaping Use   Vaping Use: Never used  Substance and Sexual Activity   Alcohol use: Yes    Comment: once a year on NYE   Drug use: No   Sexual activity: Not Currently  Other Topics Concern   Not on file  Social History Narrative   09/22/19   From: the area   Living: with partner - Bunnie Philips (dementia) 1984   Work: retired - Investment banker, 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  He does all of the housework including cooking, cleaning, laundry, vacuuming and yard work.      Safety   Seat belts: Yes    Guns: Yes  and secure   Safe in relationships: Yes    Social Determinants of Health   Financial Resource Strain: Low Risk    Difficulty of Paying Living Expenses: Not hard at all  Food Insecurity: No Food Insecurity   Worried About Charity fundraiser in the Last Year: Never true   Ran Out of Food in the Last Year: Never true  Transportation Needs: No Transportation Needs   Lack of Transportation (Medical): No   Lack of Transportation (Non-Medical): No  Physical Activity: Inactive   Days of Exercise per Week: 0 days   Minutes of Exercise per Session: 0 min  Stress: No Stress Concern Present   Feeling of Stress : Not at all  Social Connections: Not on file  Intimate Partner Violence: Not At Risk   Fear of Current or Ex-Partner: No   Emotionally Abused: No   Physically Abused: No   Sexually Abused: No    FAMILY HISTORY: Family History   Problem Relation Age of Onset   Cerebral aneurysm Mother    Alzheimer's disease Father    Lung cancer Sister 36       treated surgically    ALLERGIES:  is allergic to indocin [indomethacin] and penicillins.  MEDICATIONS:  Current Outpatient Medications  Medication Sig Dispense Refill   allopurinol (ZYLOPRIM) 300 MG tablet Take 1 tablet (300 mg total) by mouth daily. 90 tablet 3   aspirin EC 81 MG tablet Take 81 mg by mouth as needed.     Iron-FA-B Cmp-C-Biot-Probiotic (FUSION PLUS) CAPS Take 1 capsule by mouth daily. [place]  capsule 5   Potassium 99 MG TABS Take 297 mg by mouth daily.      nitroGLYCERIN (NITROSTAT) 0.4 MG SL tablet Place 1 tablet (0.4 mg total) under the tongue every 5 (five) minutes as needed for chest pain. (Patient not taking: Reported on 08/31/2021) 30 tablet 0   No current facility-administered medications for this visit.     PHYSICAL EXAMINATION: ECOG PERFORMANCE STATUS: 0 - Asymptomatic Vitals:   08/31/21 1320  BP: 133/65  Pulse: 83  Resp: 18  Temp: (!) 96.9 F (36.1 C)   Filed Weights   08/31/21 1320  Weight: 219 lb 9.6 oz (99.6 kg)    Physical Exam Constitutional:      General: He is not in acute distress. HENT:     Head: Normocephalic and atraumatic.  Eyes:     General: No scleral icterus. Cardiovascular:     Rate and Rhythm: Normal rate and regular rhythm.     Heart sounds: Normal heart sounds.  Pulmonary:     Effort: Pulmonary effort is normal. No respiratory distress.     Breath sounds: No wheezing.  Abdominal:     General: Bowel sounds are normal. There is no distension.     Palpations: Abdomen is soft.  Musculoskeletal:        General: No deformity. Normal range of motion.     Cervical back: Normal range of motion and neck supple.  Skin:    General: Skin is warm and dry.     Findings: No erythema or rash.  Neurological:     Mental Status: He is alert and oriented to person, place, and time. Mental status is at baseline.      Cranial Nerves: No cranial nerve deficit.     Coordination: Coordination normal.  Psychiatric:        Mood and Affect: Mood normal.       Latest Ref Rng & Units 08/31/2021    2:21 PM  CMP  Glucose 70 - 99 mg/dL 99    BUN 8 - 23 mg/dL 17    Creatinine 0.61 - 1.24 mg/dL 1.04    Sodium 135 - 145 mmol/L 136    Potassium 3.5 - 5.1 mmol/L 4.3    Chloride 98 - 111 mmol/L 103    CO2 22 - 32 mmol/L 23    Calcium 8.9 - 10.3 mg/dL 8.8    Total Protein 6.5 - 8.1 g/dL 7.8    Total Bilirubin 0.3 - 1.2 mg/dL 0.7    Alkaline Phos 38 - 126 U/L 64    AST 15 - 41 U/L 28    ALT 0 - 44 U/L 21        Latest Ref Rng & Units 08/31/2021    2:21 PM  CBC  WBC 4.0 - 10.5 K/uL 11.2    Hemoglobin 13.0 - 17.0 g/dL 10.8    Hematocrit 39.0 - 52.0 % 36.3    Platelets 150 - 400 K/uL 407       RADIOGRAPHIC STUDIES: I have personally reviewed the radiological images as listed and agreed with the findings in the report.  ECHOCARDIOGRAM COMPLETE  Result Date: 07/29/2021    ECHOCARDIOGRAM REPORT   Patient Name:   Phillip Nelson Date of Exam: 07/29/2021 Medical Rec #:  [id number]          Height:       70.0 in Accession #:    [id number]         Weight:  221.0 lb Date of Birth:  [birth year]         BSA:          2.178 m Patient Age:    71 years           BP:           128/68 mmHg Patient Gender: M                  HR:           84 bpm. Exam Location:  [facility] Procedure: 2D Echo, Cardiac Doppler, Color Doppler and Intracardiac            Opacification Agent Indications:    R06.02 SOB  History:        Patient has no prior history of Echocardiogram examinations. CAD                 and Previous Myocardial Infarction, Signs/Symptoms:Shortness of                 Breath; Risk Factors:Dyslipidemia, Sleep Apnea and Former                 Smoker.  Sonographer:    Pilar Jarvis RDMS, RVT, RDCS Referring Phys: [place]  1. Left ventricular ejection fraction, by estimation, is 55 to 60%. The left  ventricle has normal function. The left ventricle has no regional wall motion abnormalities. Left ventricular diastolic parameters are consistent with Grade I diastolic dysfunction (impaired relaxation).  2. Right ventricular systolic function is normal. The right ventricular size is normal. Tricuspid regurgitation signal is inadequate for assessing PA pressure.  3. The mitral valve is normal in structure. No evidence of mitral valve regurgitation. No evidence of mitral stenosis.  4. The aortic valve is normal in structure. Aortic valve regurgitation is not visualized. Aortic valve sclerosis/calcification is present, without any evidence of aortic stenosis. Aortic valve area, by VTI measures 2.75 cm.  5. The inferior vena cava is normal in size with greater than 50% respiratory variability, suggesting right atrial pressure of 3 mmHg. FINDINGS  Left Ventricle: Left ventricular ejection fraction, by estimation, is 55 to 60%. The left ventricle has normal function. The left ventricle has no regional wall motion abnormalities. Definity contrast agent was given IV to delineate the left ventricular  endocardial borders. The left ventricular internal cavity size was normal in size. There is no left ventricular hypertrophy. Left ventricular diastolic parameters are consistent with Grade I diastolic dysfunction (impaired relaxation). Right Ventricle: The right ventricular size is normal. No increase in right ventricular wall thickness. Right ventricular systolic function is normal. Tricuspid regurgitation signal is inadequate for assessing PA pressure. Left Atrium: Left atrial size was normal in size. Right Atrium: Right atrial size was normal in size. Pericardium: There is no evidence of pericardial effusion. Mitral Valve: The mitral valve is normal in structure. No evidence of mitral valve regurgitation. No evidence of mitral valve stenosis. Tricuspid Valve: The tricuspid valve is normal in structure. Tricuspid valve  regurgitation is mild . No evidence of tricuspid stenosis. Aortic Valve: The aortic valve is normal in structure. Aortic valve regurgitation is not visualized. Aortic valve sclerosis/calcification is present, without any evidence of aortic stenosis. Aortic valve mean gradient measures 2.0 mmHg. Aortic valve peak  gradient measures 4.0 mmHg. Aortic valve area, by VTI measures 2.75 cm. Pulmonic Valve: The pulmonic valve was normal in structure. Pulmonic valve regurgitation is not visualized. No evidence of pulmonic stenosis. Aorta:  The aortic root is normal in size and structure. Venous: The inferior vena cava is normal in size with greater than 50% respiratory variability, suggesting right atrial pressure of 3 mmHg. IAS/Shunts: No atrial level shunt detected by color flow Doppler.  LEFT VENTRICLE PLAX 2D LVIDd:         5.50 cm   Diastology LVIDs:         4.00 cm   LV e' medial:    5.77 cm/s LV PW:         0.90 cm   LV E/e' medial:  12.7 LV IVS:        0.90 cm   LV e' lateral:   4.13 cm/s LVOT diam:     1.90 cm   LV E/e' lateral: 17.7 LV SV:         51 LV SV Index:   23 LVOT Area:     2.84 cm  RIGHT VENTRICLE RV S prime:     11.10 cm/s TAPSE (M-mode): 2.4 cm LEFT ATRIUM             Index        RIGHT ATRIUM           Index LA diam:        3.90 cm 1.79 cm/m   RA Area:     20.00 cm LA Vol (A2C):   52.2 ml 23.97 ml/m  RA Volume:   56.80 ml  26.08 ml/m LA Vol (A4C):   34.0 ml 15.61 ml/m LA Biplane Vol: 43.7 ml 20.07 ml/m  AORTIC VALVE                    PULMONIC VALVE AV Area (Vmax):    2.79 cm     PV Vmax:       1.34 m/s AV Area (Vmean):   2.51 cm     PV Peak grad:  7.2 mmHg AV Area (VTI):     2.75 cm AV Vmax:           100.00 cm/s AV Vmean:          70.900 cm/s AV VTI:            0.184 m AV Peak Grad:      4.0 mmHg AV Mean Grad:      2.0 mmHg LVOT Vmax:         98.30 cm/s LVOT Vmean:        62.650 cm/s LVOT VTI:          0.178 m LVOT/AV VTI ratio: 0.97  AORTA Ao Root diam: 3.40 cm MITRAL VALVE MV Area (PHT):  4.01 cm     SHUNTS MV Decel Time: 189 msec     Systemic VTI:  0.18 m MV E velocity: 73.30 cm/s   Systemic Diam: 1.90 cm MV A velocity: 115.00 cm/s MV E/A ratio:  0.64 Ida Rogue MD Electronically signed by Ida Rogue MD Signature Date/Time: 07/29/2021/12:38:03 PM    Final       LABORATORY DATA:  I have reviewed the data as listed Lab Results  Component Value Date   WBC 11.2 (H) 08/31/2021   HGB 10.8 (L) 08/31/2021   HCT 36.3 (L) 08/31/2021   MCV 84.4 08/31/2021   PLT 407 (H) 08/31/2021   Recent Labs    09/30/20 0840 07/12/21 1105 08/31/21 1421  NA 138 138 136  K 4.3 4.0 4.3  CL 104 104 103  CO2 '23 26 23  '$ GLUCOSE  86 125* 99  BUN '18 20 17  '$ CREATININE 1.12 1.09 1.04  CALCIUM 9.3 9.1 8.8*  GFRNONAA  --   --  >60  PROT 7.3 6.8 7.8  ALBUMIN 4.3 4.2 4.2  AST '30 21 28  '$ ALT '27 18 21  '$ ALKPHOS 63 66 64  BILITOT 1.2 0.6 0.7    Iron/TIBC/Ferritin/ %Sat    Component Value Date/Time   IRON 43 (L) 08/31/2021 1421   TIBC 473 (H) 08/31/2021 1421   FERRITIN 52 08/31/2021 1421   IRONPCTSAT 9 (L) 08/31/2021 1421        ASSESSMENT & PLAN:  1. Colon adenocarcinoma ([place])   2. Mass of colon   3. Iron deficiency anemia due to chronic blood loss    #Transverse colon adenocarcinoma I recommend staging CT chest abdomen pelvis with contrast. Check CBC, CMP, CEA, iron, TIBC ferritin. If no distant metastasis, will refer patient to establish care with surgery. Patient agrees with plan.  #Discussed about genetic counseling.  Patient will update me if he is interested. #Iron deficiency anemia, patient has been on iron supplementation.  And he tolerates. Hemoglobin has increased to 10.8 comparing to 1 month ago.  Ferritin increased to 52, iron saturation remains low at 29.   Patient prefers to continue oral iron supplementation and I think is reasonable given that hemoglobin has improved after started on iron treatments.  If his hemoglobin drops in the future, will consider IV  Venofer treatments.  # History of prostate cancer in 2014 07/12/21 PSA 0.00.  Recommend patient continue follow-up with PCP/urology    Orders Placed This Encounter  Procedures   CT CHEST ABDOMEN PELVIS W CONTRAST    Standing Status:   Future    Standing Expiration Date:   09/01/2022    Order Specific Question:   Preferred imaging location?    Answer:   [facility]    Order Specific Question:   Is Oral Contrast requested for this exam?    Answer:   Yes, Per Radiology protocol   CBC with Differential/Platelet    Standing Status:   Future    Number of Occurrences:   1    Standing Expiration Date:   09/01/2022   Comprehensive metabolic panel    Standing Status:   Future    Number of Occurrences:   1    Standing Expiration Date:   08/31/2022   Iron and TIBC    Standing Status:   Future    Number of Occurrences:   1    Standing Expiration Date:   09/01/2022   Ferritin    Standing Status:   Future    Number of Occurrences:   1    Standing Expiration Date:   09/01/2022   CEA    Standing Status:   Future    Number of Occurrences:   1    Standing Expiration Date:   08/31/2022    All questions were answered. The patient knows to call the clinic with any problems questions or concerns.  Return of visit: To be determined.  Earlie Server, MD, PhD  08/31/2021

## 2021-09-01 ENCOUNTER — Other Ambulatory Visit: Payer: Medicare Other

## 2021-09-01 LAB — CEA: CEA: 9.8 ng/mL — ABNORMAL HIGH (ref 0.0–4.7)

## 2021-09-01 NOTE — Progress Notes (Signed)
Tumor Board Documentation  Phillip Nelson was presented by Dr Tasia Catchings at our Tumor Board on 09/01/2021, which included representatives from medical oncology, surgical, pharmacy, pulmonology, genetics, radiology, pathology, radiation oncology, navigation, research, internal medicine, palliative care.  Phillip Nelson currently presents as a current patient, for Phillip Nelson, for new positive pathology with history of the following treatments: surgical intervention(s).  Additionally, we reviewed previous medical and familial history, history of present illness, and recent lab results along with all available histopathologic and imaging studies. The tumor board considered available treatment options and made the following recommendations: Surgery (if no mets found on CT Staging)    The following procedures/referrals were also placed: No orders of the defined types were placed in this encounter.   Clinical Trial Status: not discussed   Staging used: To be determined AJCC Staging:       Group: Invasive moderately differentiated Adenocarcinoma of Transverse Colon   National site-specific guidelines NCCN were discussed with respect to the case.  Tumor board is a meeting of clinicians from various specialty areas who evaluate and discuss patients for whom a multidisciplinary approach is being considered. Final determinations in the plan of care are those of the provider(s). The responsibility for follow up of recommendations given during tumor board is that of the provider.   Today's extended care, comprehensive team conference, [place] was not present for the discussion and was not examined.   Multidisciplinary Tumor Board is a multidisciplinary case peer review process.  Decisions discussed in the Multidisciplinary Tumor Board reflect the opinions of the specialists present at the conference without having examined the patient.  Ultimately, treatment and diagnostic decisions rest with the primary provider(s) and  the patient.

## 2021-09-02 ENCOUNTER — Telehealth: Payer: Self-pay

## 2021-09-02 NOTE — Telephone Encounter (Signed)
-----   Message from Stephens November sent at 09/01/2021  2:50 PM EDT ----- Pt appts have been moved up, pt has been notified. ----- Message ----- From: Earlie Server, MD Sent: 08/31/2021   9:26 PM EDT To: Evelina Dun, RN; Stephens November; #  Is a possible to move his CT scan earlier.  If no appointment within a week, please switch order to stat.  Thank you

## 2021-09-08 ENCOUNTER — Ambulatory Visit: Payer: Medicare Other

## 2021-09-09 ENCOUNTER — Ambulatory Visit
Admission: RE | Admit: 2021-09-09 | Discharge: 2021-09-09 | Disposition: A | Discharge status: Home / Self Care | Payer: Medicare Other | Source: Ambulatory Visit | Attending: Oncology | Admitting: Oncology

## 2021-09-09 DIAGNOSIS — I2510 Atherosclerotic heart disease of native coronary artery without angina pectoris: Secondary | ICD-10-CM | POA: Diagnosis not present

## 2021-09-09 DIAGNOSIS — N281 Cyst of kidney, acquired: Secondary | ICD-10-CM | POA: Diagnosis not present

## 2021-09-09 DIAGNOSIS — I700 Atherosclerosis of aorta: Secondary | ICD-10-CM | POA: Diagnosis not present

## 2021-09-09 DIAGNOSIS — C189 Malignant neoplasm of colon, unspecified: Secondary | ICD-10-CM | POA: Diagnosis not present

## 2021-09-09 DIAGNOSIS — K5730 Diverticulosis of large intestine without perforation or abscess without bleeding: Secondary | ICD-10-CM | POA: Diagnosis not present

## 2021-09-09 DIAGNOSIS — K6389 Other specified diseases of intestine: Secondary | ICD-10-CM | POA: Diagnosis not present

## 2021-09-09 MED ORDER — IOHEXOL 300 MG/ML  SOLN
100.0000 mL | Freq: Once | INTRAMUSCULAR | Status: AC | PRN
Start: 2021-09-09 — End: 2021-09-09
  Administered 2021-09-09: 100 mL via INTRAVENOUS

## 2021-09-12 ENCOUNTER — Telehealth: Payer: Self-pay

## 2021-09-12 ENCOUNTER — Ambulatory Visit (INDEPENDENT_AMBULATORY_CARE_PROVIDER_SITE_OTHER): Payer: Medicare Other

## 2021-09-12 ENCOUNTER — Other Ambulatory Visit: Payer: Self-pay

## 2021-09-12 DIAGNOSIS — Z0000 Encounter for general adult medical examination without abnormal findings: Secondary | ICD-10-CM

## 2021-09-12 DIAGNOSIS — C189 Malignant neoplasm of colon, unspecified: Secondary | ICD-10-CM

## 2021-09-12 NOTE — Telephone Encounter (Signed)
[facility] Night - Client Nonclinical Telephone Record  [facility] Client [name] Night - Client Client Site [facility] Provider [name] Contact Type Call Who Is Calling Patient / Member / Family / Caregiver Caller Name [place] Phone Number [phone] Patient Name Phillip Nelson Patient DOB 04/02/1934 Call Type Message Only Information Provided Reason for Call Request to Reschedule Office Appointment Initial Comment caller states that he has an appointment for Monday and would like to reschedule. Patient request to speak to RN No Additional Comment Office hours provided, caller declined triage. Disp. Time Disposition Final User 09/09/2021 7:44:37 PM General Information Provided Yes Perla-Benitez, Jocelyn Call Closed By: Claris Gladden Transaction Date/Time: 09/09/2021 7:42:02 PM (ET

## 2021-09-12 NOTE — Progress Notes (Signed)
Virtual Visit via Telephone Note  I connected with  Phillip Nelson on 09/12/21 at  9:45 AM EDT by telephone and verified that I am speaking with the correct person using two identifiers.  Location: Patient: home Provider: [place] Persons participating in the virtual visit: [place]   I discussed the limitations, risks, security and privacy concerns of performing an evaluation and management service by telephone and the availability of in person appointments. The patient expressed understanding and agreed to proceed.  Interactive audio and video telecommunications were attempted between this nurse and patient, however failed, due to patient having technical difficulties OR patient did not have access to video capability.  We continued and completed visit with audio only.  Some vital signs may be absent or patient reported.   Dionisio David, LPN  Subjective:   Phillip Nelson is a 87 y.o. male who presents for Medicare Annual/Subsequent preventive examination.  Review of Systems           Objective:    There were no vitals filed for this visit. There is no height or weight on file to calculate BMI.     08/31/2021    1:17 PM 08/24/2021    9:25 AM 09/09/2020    9:51 AM 11/26/2019    9:50 AM 11/11/2019    9:19 AM 12/17/2018    9:24 AM 11/20/2018    4:27 AM  Advanced Directives  Does Patient Have a Medical Advance Directive? No No No Yes Yes Yes No  Type of Visual merchandiser of [employer] Power of Attorney   Does patient want to make changes to medical advance directive?     Yes (ED - Information included in AVS)    Copy of [place] in Chart?      No - copy requested   Would patient like information on creating a medical advance directive?   No - Patient declined    No - Patient declined    Current Medications (verified) Outpatient Encounter Medications as of 09/12/2021   Medication Sig   allopurinol (ZYLOPRIM) 300 MG tablet Take 1 tablet (300 mg total) by mouth daily.   aspirin EC 81 MG tablet Take 81 mg by mouth as needed.   Iron-FA-B Cmp-C-Biot-Probiotic (FUSION PLUS) CAPS Take 1 capsule by mouth daily.   nitroGLYCERIN (NITROSTAT) 0.4 MG SL tablet Place 1 tablet (0.4 mg total) under the tongue every 5 (five) minutes as needed for chest pain. (Patient not taking: Reported on 08/31/2021)   Potassium 99 MG TABS Take 297 mg by mouth daily.    No facility-administered encounter medications on file as of 09/12/2021.    Allergies (verified) Indocin [indomethacin] and Penicillins   History: Past Medical History:  Diagnosis Date   Aural vertigo    Basal cell carcinoma 12/31/2006   L mid infraclavicular. Excised 02/27/2007, margins free.   Basal cell carcinoma 10/30/2016    R post shoulder. Nodular pattern.   Basal cell carcinoma 03/31/2020   L lat pretibial - ED&C    Colon polyps    Coronary artery disease involving native coronary artery of native heart with other form of angina pectoris ([place]) 09/2003   [facility]-Dr. Nehemiah Massed- transitioned to Dr. [name] [facility] 06/2021: -- 09/2003: Non-STEMI (Sx= heaviness in chest ~100lb brick, no SOB) = Cath w/ 95% ostial LAD with diffuse 50% stenosis -> referred for CABG x1 (LIMA-LAD); Non-Ischemic Cardiolite 07/2016  Diverticulitis    ED (erectile dysfunction)    Frequent headaches    Gout, arthritis    History of skin cancer    Hyperlipidemia    OSA (obstructive sleep apnea)    CPAP NON-COMPLIANT   Prostate cancer ([place]) 05/08/2012   Adenocarcinoma,gleason=3+3=6,PSA=8.26,vol=57cc   S/P CABG x 1 10/19/2003   [facility];Tobey Grim, IV, MD: LIMA-LAD   Squamous cell carcinoma, arm, left    L forearm prox dorsum    Squamous cell carcinoma, arm, left    L forearm prox medial    Squamous cell carcinoma, arm, right 08/23/2017   R mid volar forearm   Past Surgical History:  Procedure Laterality Date    APPENDECTOMY  1999   Life was saved with emerg. surgery.    BILATERAL CARPAL TUNNEL RELEASE Right 06/28/2001   Right (endoscopic)   CARPECTOMY Right    wrist   CATARACT EXTRACTION W/ INTRAOCULAR LENS  IMPLANT, BILATERAL     COLONOSCOPY WITH PROPOFOL N/A 10/31/2016   Procedure: COLONOSCOPY WITH PROPOFOL;  Surgeon: Christene Lye, MD;  Location: [facility] ENDOSCOPY;  Service: Endoscopy;  Laterality: N/A;   COLONOSCOPY WITH PROPOFOL N/A 08/24/2021   Procedure: COLONOSCOPY WITH PROPOFOL;  Surgeon: Lin Landsman, MD;  Location: [facility] ENDOSCOPY;  Service: Gastroenterology;  Laterality: N/A;   CORONARY ARTERY BYPASS GRAFT  10/19/2003   [facility], Tobey Grim, IV, MD -> LIMA-LAD (for Ostial LAD 95% & multiple sequential 50% lesions.   DORSAL COMPARTMENT RELEASE Left 06/25/2017   Procedure: LEFT WRIST TENDON SHEATH RELEASE;  Surgeon: Milly Jakob, MD;  Location: [facility];  Service: Orthopedics;  Laterality: Left;   EMBOLIZATION N/A 11/21/2018   Procedure: EMBOLIZATION (Colonic);  Surgeon: Katha Cabal, MD;  Location: [facility];  Service: Cardiovascular;  Laterality: N/A;   ESOPHAGOGASTRODUODENOSCOPY (EGD) WITH PROPOFOL N/A 08/24/2021   Procedure: ESOPHAGOGASTRODUODENOSCOPY (EGD) WITH PROPOFOL;  Surgeon: Lin Landsman, MD;  Location: [facility] ENDOSCOPY;  Service: Gastroenterology;  Laterality: N/A;   FOOT SURGERY Bilateral    GOUT   KNEE ARTHROSCOPY Bilateral 1986   Right: 1986; left 09/06/2009   LEFT ELBOW SURGERY  AGE 3  &  2007   LEFT HEART CATH AND CORONARY ANGIOGRAPHY  10/19/2003   [facility] (non-STEMI): Ostial LAD 95% with multiple sequential 50% stenoses. => Referred for CABG   NM GATED MYOCARDIAL STUDY (ARMX HX)  07/31/2016   Treadmill Myoview/Cardiolite : [facility]): 4.6 METS, 92 % MPHR.  EF 55 to 60%.  Normal wall motion.  No ischemia or infarction.   OLECRANON BURSA EXCISION Right 06/28/2001   OLECRANON BURSA EXCISION Left  06/24/2007   RADIOACTIVE SEED IMPLANT N/A 08/02/2012   Procedure: RADIOACTIVE SEED IMPLANT;  Surgeon: Claybon Jabs, MD;  Location: [facility];  Service: Urology;  Laterality: N/A;   TOTAL KNEE ARTHROPLASTY Bilateral 04/14/2004   RIGHT 2006; LEFT 11/21/2010   TRANSTHORACIC ECHOCARDIOGRAM  07/31/2016   ([facility]) normal LV function.  Mild LVH.  EF 50%.  Normal RV.  Mild MR, TR and PR.  No stenosis.   Family History  Problem Relation Age of Onset   Cerebral aneurysm Mother    Alzheimer's disease Father    Lung cancer Sister 36       treated surgically   Social History   Socioeconomic History   Marital status: Domestic Partner    Spouse name: Blanch Media   Number of children: 1   Years of education: Not on file   Highest education level:  Some college, no degree  Occupational History   Occupation: Retired  Tobacco Use   Smoking status: Former    Packs/day: 3.00    Years: 31.00    Total pack years: 93.00    Types: Cigarettes    Quit date: 08/01/1987    Years since quitting: 34.1   Smokeless tobacco: Never  Vaping Use   Vaping Use: Never used  Substance and Sexual Activity   Alcohol use: Yes    Comment: once a year on NYE   Drug use: No   Sexual activity: Not Currently  Other Topics Concern   Not on file  Social History Narrative   09/22/19   From: the area   Living: with partner - Bunnie Philips (dementia) 1984   Work: retired - Investment banker, 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  He does all of the housework including cooking, cleaning, laundry, vacuuming and yard work.      Safety   Seat belts: Yes    Guns: Yes  and secure   Safe in relationships: Yes    Social Determinants of Health   Financial Resource Strain: Low Risk  (09/09/2020)   Overall Financial Resource Strain  (CARDIA)    Difficulty of Paying Living Expenses: Not hard at all  Food Insecurity: No Food Insecurity (09/09/2020)   Hunger Vital Sign    Worried About Running Out of Food in the Last Year: Never true    Ran Out of Food in the Last Year: Never true  Transportation Needs: No Transportation Needs (09/09/2020)   PRAPARE - Hydrologist (Medical): No    Lack of Transportation (Non-Medical): No  Physical Activity: Inactive (09/09/2020)   Exercise Vital Sign    Days of Exercise per Week: 0 days    Minutes of Exercise per Session: 0 min  Stress: No Stress Concern Present (09/09/2020)   [place]    Feeling of Stress : Not at all  Social Connections: Somewhat Isolated (12/11/2017)   Social Connection and Isolation Panel [NHANES]    Frequency of Communication with Friends and Family: More than three times a week    Frequency of Social Gatherings with Friends and Family: More than three times a week    Attends Religious Services: Never    Marine scientist or Organizations: No    Attends Music therapist: Never    Marital Status: Living with partner    Tobacco Counseling Counseling given: Not Answered   Clinical Intake:  Pre-visit preparation completed: Yes  Pain : No/denies pain     Nutritional Risks: None Diabetes: No     Diabetic?no  Interpreter Needed?: No  Information entered by :: Kirke Shaggy, LPN   Activities of Daily Living     No data to display          Patient Care Team: [name], MD as PCP - General (Family Medicine) Beverly Gust, MD (Otolaryngology) Ralene Bathe, MD (Dermatology) Corey Skains, MD as Consulting Physician (Cardiology) Birder Robson, MD (Ophthalmology)  Indicate any recent Medical Services you may have received from other than [facility] providers in the past year (date may be  approximate).      Assessment:   This is a routine wellness examination for Phillip Nelson.  Hearing/Vision screen No results found.  Dietary issues and exercise activities discussed:     Goals Addressed   None    Depression Screen    09/09/2020    9:59 AM 09/22/2019   12:42 PM 12/17/2018    9:17 AM 12/03/2018   10:51 AM 12/11/2017    8:58 AM 10/29/2017    9:17 AM 07/10/2016    9:28 AM  PHQ 2/9 Scores  PHQ - 2 Score 0 0 0 1 0 0 0  PHQ- 9 Score 0 1  4       Fall Risk    09/09/2020    9:58 AM 12/17/2018    9:17 AM 12/11/2017    8:58 AM 10/29/2017    9:17 AM 10/25/2017   11:32 AM  Fall Risk   Falls in the past year? 1 0 No No No  Comment     Emmi Telephone Survey: data to providers prior to load  Number falls in past yr: 0      Injury with Fall? 1      Risk for fall due to : No Fall Risks      Follow up Falls evaluation completed;Falls prevention discussed        FALL RISK PREVENTION PERTAINING TO THE HOME:  Any stairs in or around the home? No  If so, are there any without handrails? No  Home free of loose throw rugs in walkways, pet beds, electrical cords, etc? Yes  Adequate lighting in your home to reduce risk of falls? Yes   ASSISTIVE DEVICES UTILIZED TO PREVENT FALLS:  Life alert? No  Use of a cane, walker or w/c? No  Grab bars in the bathroom? Yes  Shower chair or bench in shower? No  Elevated toilet seat or a handicapped toilet? No   Cognitive Function: pt refused     09/09/2020   10:09 AM  MMSE - Mini Mental State Exam  Not completed: Refused        12/11/2017    9:07 AM  6CIT Screen  What Year? 0 points  What month? 0 points  What time? 0 points  Count back from 20 0 points  Months in reverse 0 points  Repeat phrase 0 points  Total Score 0 points    Immunizations Immunization History  Administered Date(s) Administered   Fluad Quad(high Dose 65+) 12/24/2018   Influenza, High Dose Seasonal PF 02/02/2015, 01/10/2017, 01/10/2018, 01/14/2021   Influenza-Unspecified  01/02/2013, 01/08/2014, 02/02/2015   PFIZER(Purple Top)SARS-COV-2 Vaccination 04/17/2019, 05/08/2019, 12/29/2019, 07/16/2020   Pneumococcal Conjugate-13 08/10/2010   Pneumococcal Polysaccharide-23 01/02/2003, 08/10/2014   Tdap 08/10/2014    TDAP status: Up to date  Flu Vaccine status: Up to date  Pneumococcal vaccine status: Up to date  Covid-19 vaccine status: Completed vaccines  Qualifies for Shingles Vaccine? No   Zostavax completed No   Shingrix Completed?: No.    Education has been provided regarding the importance of this vaccine. Patient has been advised to call insurance company to determine out of pocket expense if they have not yet received this vaccine. Advised may also receive vaccine at local pharmacy or Health Dept. Verbalized acceptance and understanding.  Screening Tests Health Maintenance  Topic Date Due   Zoster Vaccines- Shingrix (1 of 2) Never done   COVID-19 Vaccine (5 - Booster for Pfizer series) 09/10/2020   INFLUENZA VACCINE  10/25/2021   TETANUS/TDAP  08/09/2024   Pneumonia Vaccine 31+ Years old  Completed   HPV VACCINES  Aged Out    Health Maintenance  Health Maintenance Due  Topic Date Due   Zoster Vaccines- Shingrix (1 of 2) Never done   COVID-19 Vaccine (5 - Booster for Pfizer series) 09/10/2020    Colorectal cancer screening: Type of screening: Colonoscopy. Completed 08/24/21. Repeat every 10 years  Lung Cancer Screening: (Low Dose CT Chest recommended if Age 52-80 years, 30 pack-year currently smoking OR have quit w/in 15years.) does not qualify.   Additional Screening:  Hepatitis C Screening: does not qualify; Completed no  Vision Screening: Recommended annual ophthalmology exams for early detection of glaucoma and other disorders of the eye. Is the patient up to date with their annual eye exam?  Yes  Who is the provider or what is the name of the office in which the patient attends annual eye exams? [facility] If pt is not  established with a provider, would they like to be referred to a provider to establish care? No .   Dental Screening: Recommended annual dental exams for proper oral hygiene  Community Resource Referral / Chronic Care Management: CRR required this visit?  No   CCM required this visit?  No      Plan:     I have personally reviewed and noted the following in the patient's chart:   Medical and social history Use of alcohol, tobacco or illicit drugs  Current medications and supplements including opioid prescriptions. Patient is not currently taking opioid prescriptions. Functional ability and status Nutritional status Physical activity Advanced directives List of other physicians Hospitalizations, surgeries, and ER visits in previous 12 months Vitals Screenings to include cognitive, depression, and falls Referrals and appointments  In addition, I have reviewed and discussed with patient certain preventive protocols, quality metrics, and best practice recommendations. A written personalized care plan for preventive services as well as general preventive health recommendations were provided to patient.     Dionisio David, LPN   [id number]   Nurse Notes: none

## 2021-09-12 NOTE — Telephone Encounter (Signed)
Called and informed patient of CT results. Advised we have placed referral to Dr. Corlis Leak office and they will be in contact to set up appt. Patient verbalized understanding

## 2021-09-12 NOTE — Patient Instructions (Signed)
[name] , Thank you for taking time to come for your Medicare Wellness Visit. I appreciate your ongoing commitment to your health goals. Please review the following plan we discussed and let me know if I can assist you in the future.   Screening recommendations/referrals: Colonoscopy: 08/24/21 Recommended yearly ophthalmology/optometry visit for glaucoma screening and checkup Recommended yearly dental visit for hygiene and checkup  Vaccinations: Influenza vaccine: 01/14/21 Pneumococcal vaccine: 08/10/14 Tdap vaccine: 08/10/14 Shingles vaccine: n/d   Covid-19: 04/17/19, 05/08/19, 12/29/19, 07/16/20  Advanced directives: no  Conditions/risks identified: none  Next appointment: Follow up in one year for your annual wellness visit. - declined  Preventive Care 85 Years and Older, Male Preventive care refers to lifestyle choices and visits with your health care provider that can promote health and wellness. What does preventive care include? A yearly physical exam. This is also called an annual well check. Dental exams once or twice a year. Routine eye exams. Ask your health care provider how often you should have your eyes checked. Personal lifestyle choices, including: Daily care of your teeth and gums. Regular physical activity. Eating a healthy diet. Avoiding tobacco and drug use. Limiting alcohol use. Practicing safe sex. Taking low doses of aspirin every day. Taking vitamin and mineral supplements as recommended by your health care provider. What happens during an annual well check? The services and screenings done by your health care provider during your annual well check will depend on your age, overall health, lifestyle risk factors, and family history of disease. Counseling  Your health care provider may ask you questions about your: Alcohol use. Tobacco use. Drug use. Emotional well-being. Home and relationship well-being. Sexual activity. Eating habits. History of  falls. Memory and ability to understand (cognition). Work and work Statistician. Screening  You may have the following tests or measurements: Height, weight, and BMI. Blood pressure. Lipid and cholesterol levels. These may be checked every 5 years, or more frequently if you are over 85 years old. Skin check. Lung cancer screening. You may have this screening every year starting at age 85 if you have a 30-pack-year history of smoking and currently smoke or have quit within the past 15 years. Fecal occult blood test (FOBT) of the stool. You may have this test every year starting at age 85. Flexible sigmoidoscopy or colonoscopy. You may have a sigmoidoscopy every 5 years or a colonoscopy every 10 years starting at age 85. Prostate cancer screening. Recommendations will vary depending on your family history and other risks. Hepatitis C blood test. Hepatitis B blood test. Sexually transmitted disease (STD) testing. Diabetes screening. This is done by checking your blood sugar (glucose) after you have not eaten for a while (fasting). You may have this done every 1-3 years. Abdominal aortic aneurysm (AAA) screening. You may need this if you are a current or former smoker. Osteoporosis. You may be screened starting at age 85 if you are at high risk. Talk with your health care provider about your test results, treatment options, and if necessary, the need for more tests. Vaccines  Your health care provider may recommend certain vaccines, such as: Influenza vaccine. This is recommended every year. Tetanus, diphtheria, and acellular pertussis (Tdap, Td) vaccine. You may need a Td booster every 10 years. Zoster vaccine. You may need this after age 85. Pneumococcal 13-valent conjugate (PCV13) vaccine. One dose is recommended after age 85. Pneumococcal polysaccharide (PPSV23) vaccine. One dose is recommended after age 85. Talk to your health care provider about which  screenings and vaccines you need and  how often you need them. This information is not intended to replace advice given to you by your health care provider. Make sure you discuss any questions you have with your health care provider. Document Released: 04/09/2015 Document Revised: 12/01/2015 Document Reviewed: 01/12/2015 Elsevier Interactive Patient Education  2017 [place] Prevention in the Home Falls can cause injuries. They can happen to people of all ages. There are many things you can do to make your home safe and to help prevent falls. What can I do on the outside of my home? Regularly fix the edges of walkways and driveways and fix any cracks. Remove anything that might make you trip as you walk through a door, such as a raised step or threshold. Trim any bushes or trees on the path to your home. Use bright outdoor lighting. Clear any walking paths of anything that might make someone trip, such as rocks or tools. Regularly check to see if handrails are loose or broken. Make sure that both sides of any steps have handrails. Any raised decks and porches should have guardrails on the edges. Have any leaves, snow, or ice cleared regularly. Use sand or salt on walking paths during winter. Clean up any spills in your garage right away. This includes oil or grease spills. What can I do in the bathroom? Use night lights. Install grab bars by the toilet and in the tub and shower. Do not use towel bars as grab bars. Use non-skid mats or decals in the tub or shower. If you need to sit down in the shower, use a plastic, non-slip stool. Keep the floor dry. Clean up any water that spills on the floor as soon as it happens. Remove soap buildup in the tub or shower regularly. Attach bath mats securely with double-sided non-slip rug tape. Do not have throw rugs and other things on the floor that can make you trip. What can I do in the bedroom? Use night lights. Make sure that you have a light by your bed that is easy to  reach. Do not use any sheets or blankets that are too big for your bed. They should not hang down onto the floor. Have a firm chair that has side arms. You can use this for support while you get dressed. Do not have throw rugs and other things on the floor that can make you trip. What can I do in the kitchen? Clean up any spills right away. Avoid walking on wet floors. Keep items that you use a lot in easy-to-reach places. If you need to reach something above you, use a strong step stool that has a grab bar. Keep electrical cords out of the way. Do not use floor polish or wax that makes floors slippery. If you must use wax, use non-skid floor wax. Do not have throw rugs and other things on the floor that can make you trip. What can I do with my stairs? Do not leave any items on the stairs. Make sure that there are handrails on both sides of the stairs and use them. Fix handrails that are broken or loose. Make sure that handrails are as long as the stairways. Check any carpeting to make sure that it is firmly attached to the stairs. Fix any carpet that is loose or worn. Avoid having throw rugs at the top or bottom of the stairs. If you do have throw rugs, attach them to the floor with carpet  tape. Make sure that you have a light switch at the top of the stairs and the bottom of the stairs. If you do not have them, ask someone to add them for you. What else can I do to help prevent falls? Wear shoes that: Do not have high heels. Have rubber bottoms. Are comfortable and fit you well. Are closed at the toe. Do not wear sandals. If you use a stepladder: Make sure that it is fully opened. Do not climb a closed stepladder. Make sure that both sides of the stepladder are locked into place. Ask someone to hold it for you, if possible. Clearly mark and make sure that you can see: Any grab bars or handrails. First and last steps. Where the edge of each step is. Use tools that help you move  around (mobility aids) if they are needed. These include: Canes. Walkers. Scooters. Crutches. Turn on the lights when you go into a dark area. Replace any light bulbs as soon as they burn out. Set up your furniture so you have a clear path. Avoid moving your furniture around. If any of your floors are uneven, fix them. If there are any pets around you, be aware of where they are. Review your medicines with your doctor. Some medicines can make you feel dizzy. This can increase your chance of falling. Ask your doctor what other things that you can do to help prevent falls. This information is not intended to replace advice given to you by your health care provider. Make sure you discuss any questions you have with your health care provider. Document Released: 01/07/2009 Document Revised: 08/19/2015 Document Reviewed: 04/17/2014 Elsevier Interactive Patient Education  2017 [employer].

## 2021-09-12 NOTE — Telephone Encounter (Signed)
-----   Message from Earlie Server, MD sent at 09/11/2021 11:36 PM EDT ----- Please let patient know that CT showed no distant spread of the cancer.  Recommend patient to see surgeon. Please refer patient to see Dr.Pabon for evaluation of resection.   Dr.Pabon, patient has transverse colon cancer, would you please see him for evaluation. Thanks.   Phillip Nelson

## 2021-09-13 ENCOUNTER — Other Ambulatory Visit: Payer: Medicare Other

## 2021-09-19 ENCOUNTER — Telehealth: Payer: Self-pay

## 2021-09-19 ENCOUNTER — Other Ambulatory Visit: Payer: Self-pay

## 2021-09-19 ENCOUNTER — Ambulatory Visit (INDEPENDENT_AMBULATORY_CARE_PROVIDER_SITE_OTHER): Payer: Medicare Other | Admitting: Surgery

## 2021-09-19 ENCOUNTER — Encounter: Payer: Self-pay | Admitting: Surgery

## 2021-09-19 VITALS — BP 146/77 | HR 88 | Temp 98.2°F | Ht 70.0 in | Wt 218.8 lb

## 2021-09-19 DIAGNOSIS — C184 Malignant neoplasm of transverse colon: Secondary | ICD-10-CM

## 2021-09-19 DIAGNOSIS — I2510 Atherosclerotic heart disease of native coronary artery without angina pectoris: Secondary | ICD-10-CM

## 2021-09-19 DIAGNOSIS — C189 Malignant neoplasm of colon, unspecified: Secondary | ICD-10-CM

## 2021-09-19 MED ORDER — METRONIDAZOLE 500 MG PO TABS: ORAL_TABLET | ORAL | 0 refills | Status: DC

## 2021-09-19 MED ORDER — POLYETHYLENE GLYCOL 3350 17 GM/SCOOP PO POWD: 1.0000 | Freq: Once | ORAL | 0 refills | Status: AC

## 2021-09-19 MED ORDER — NEOMYCIN SULFATE 500 MG PO TABS: 1000.0000 mg | ORAL_TABLET | Freq: Three times a day (TID) | ORAL | 0 refills | Status: DC

## 2021-09-19 MED ORDER — BISACODYL 5 MG PO TBEC: 5.0000 mg | DELAYED_RELEASE_TABLET | Freq: Once | ORAL | 0 refills | Status: AC

## 2021-09-19 NOTE — H&P (View-Only) (Signed)
Patient ID: Phillip Nelson, male   DOB: 1934-12-04, 87 y.o.   MRN: [id number]  HPI Phillip Nelson is a 87 y.o. male seen in consultation at the request of Dr. Tasia Catchings.  He initially was being worked up for anemia and positive occult blood in feces.  That prompted a colonoscopy.  Dr. [name] perform the colonoscopy couple weeks ago and I have personally reviewed the images showing evidence of an annular lesion within the proximal transverse colon.  Final pathology consistent with adenocarcinoma.  He also has completed a CT scan of the abdomen pelvis that have personally reviewed showing no evidence of distant metastatic disease.  He does have an annular lesion right at the hepatic flexure. He is 79 but he is able to perform more than 4 METS of activity without any shortness of breath or chest pain.  He did have a CABG more than 15 years ago. Denies any acute coronary symptoms. He also had a prior appendectomy and perforated gastric ulcer he has been decades since his last abdominal operations. He has multiple carpal tunnel surgeries as well as a left elbow surgery and knee replacements. He is able to perform or the activity of daily living independently.  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 Of note he did have GI bleed that required embolization by vascular surgery in August 2020 was to be from presumed sigmoid diverticulosis  HPI  Past Medical History:  Diagnosis Date   Aural vertigo    Basal cell carcinoma 12/31/2006   L mid infraclavicular. Excised 02/27/2007, margins free.   Basal cell carcinoma 10/30/2016    R post shoulder. Nodular pattern.   Basal cell carcinoma 03/31/2020   L lat pretibial - ED&C    Colon polyps    Coronary artery disease involving native coronary artery of native heart with other form of angina pectoris ([place]) 09/2003   [facility]-Dr. Nehemiah Massed- transitioned to Dr. [name] [facility] 06/2021: -- 09/2003: Non-STEMI (Sx= heaviness in chest ~100lb  brick, no SOB) = Cath w/ 95% ostial LAD with diffuse 50% stenosis -> referred for CABG x1 (LIMA-LAD); Non-Ischemic Cardiolite 07/2016   Diverticulitis    ED (erectile dysfunction)    Frequent headaches    Gout, arthritis    History of skin cancer    Hyperlipidemia    OSA (obstructive sleep apnea)    CPAP NON-COMPLIANT   Prostate cancer ([place]) 05/08/2012   Adenocarcinoma,gleason=3+3=6,PSA=8.26,vol=57cc   S/P CABG x 1 10/19/2003   [facility];Tobey Grim, IV, MD: LIMA-LAD   Squamous cell carcinoma, arm, left    L forearm prox dorsum    Squamous cell carcinoma, arm, left    L forearm prox medial    Squamous cell carcinoma, arm, right 08/23/2017   R mid volar forearm    Past Surgical History:  Procedure Laterality Date   APPENDECTOMY  1999   Life was saved with emerg. surgery.    BILATERAL CARPAL TUNNEL RELEASE Right 06/28/2001   Right (endoscopic)   CARPECTOMY Right    wrist   CATARACT EXTRACTION W/ INTRAOCULAR LENS  IMPLANT, BILATERAL     COLONOSCOPY WITH PROPOFOL N/A 10/31/2016   Procedure: COLONOSCOPY WITH PROPOFOL;  Surgeon: Christene Lye, MD;  Location: [facility] ENDOSCOPY;  Service: Endoscopy;  Laterality: N/A;   COLONOSCOPY WITH PROPOFOL N/A 08/24/2021   Procedure: COLONOSCOPY WITH PROPOFOL;  Surgeon: Lin Landsman, MD;  Location: [facility] ENDOSCOPY;  Service: Gastroenterology;  Laterality: N/A;   CORONARY ARTERY BYPASS GRAFT  10/19/2003   [facility], Tobey Grim, IV, MD -> LIMA-LAD (for Ostial LAD 95% & multiple sequential 50% lesions.   DORSAL COMPARTMENT RELEASE Left 06/25/2017   Procedure: LEFT WRIST TENDON SHEATH RELEASE;  Surgeon: Milly Jakob, MD;  Location: [place];  Service: Orthopedics;  Laterality: Left;   EMBOLIZATION N/A 11/21/2018   Procedure: EMBOLIZATION (Colonic);  Surgeon: Katha Cabal, MD;  Location: [place] CV LAB;  Service: Cardiovascular;  Laterality: N/A;   ESOPHAGOGASTRODUODENOSCOPY (EGD) WITH PROPOFOL N/A  08/24/2021   Procedure: ESOPHAGOGASTRODUODENOSCOPY (EGD) WITH PROPOFOL;  Surgeon: Lin Landsman, MD;  Location: [facility] ENDOSCOPY;  Service: Gastroenterology;  Laterality: N/A;   FOOT SURGERY Bilateral    GOUT   KNEE ARTHROSCOPY Bilateral 1986   Right: 1986; left 09/06/2009   LEFT ELBOW SURGERY  AGE 72  &  2007   LEFT HEART CATH AND CORONARY ANGIOGRAPHY  10/19/2003   [facility] (non-STEMI): Ostial LAD 95% with multiple sequential 50% stenoses. => Referred for CABG   NM GATED MYOCARDIAL STUDY (ARMX HX)  07/31/2016   Treadmill Myoview/Cardiolite : [facility]): 4.6 METS, 92 % MPHR.  EF 55 to 60%.  Normal wall motion.  No ischemia or infarction.   OLECRANON BURSA EXCISION Right 06/28/2001   OLECRANON BURSA EXCISION Left 06/24/2007   RADIOACTIVE SEED IMPLANT N/A 08/02/2012   Procedure: RADIOACTIVE SEED IMPLANT;  Surgeon: Claybon Jabs, MD;  Location[facility];  Service: Urology;  Laterality: N/A;   TOTAL KNEE ARTHROPLASTY Bilateral 04/14/2004   RIGHT 2006; LEFT 11/21/2010   TRANSTHORACIC ECHOCARDIOGRAM  07/31/2016   ([facility]) normal LV function.  Mild LVH.  EF 50%.  Normal RV.  Mild MR, TR and PR.  No stenosis.    Family History  Problem Relation Age of Onset   Cerebral aneurysm Mother    Alzheimer's disease Father    Lung cancer Sister 51       treated surgically    Social History Social History   Tobacco Use   Smoking status: Former    Packs/day: 3.00    Years: 31.00    Total pack years: 93.00    Types: Cigarettes    Quit date: 08/01/1987    Years since quitting: 34.1   Smokeless tobacco: Never  Vaping Use   Vaping Use: Never used  Substance Use Topics   Alcohol use: Yes    Comment: once a year on NYE   Drug use: No    Allergies  Allergen Reactions   Indocin [Indomethacin] Other (See Comments)    Shakes. tremors   Penicillins Itching, Rash and Other (See Comments)    Did it involve swelling of the face/tongue/throat, SOB, or low  BP? Unknown Did it involve sudden or severe rash/hives, skin peeling, or any reaction on the inside of your mouth or nose? Unknown Did you need to seek medical attention at a hospital or doctor's office? Unknown When did it last happen? unknown If all above answers are "NO", may proceed with cephalosporin use.     Current Outpatient Medications  Medication Sig Dispense Refill   bisacodyl (DULCOLAX) 5 MG EC tablet Take 1 tablet (5 mg total) by mouth once for 1 dose. 1 tablet 0   metroNIDAZOLE (FLAGYL) 500 MG tablet Take 2 tablets at 8 AM, take 2 tablets at 2 PM, and take 2 tablets at 8 PM the day prior to your surgery. 6 tablet 0   neomycin (MYCIFRADIN) 500 MG tablet Take 2 tablets (1,000 mg total)  by mouth 3 (three) times daily. 6 tablet 0   polyethylene glycol powder (GLYCOLAX/MIRALAX) 17 GM/SCOOP powder Take 255 g by mouth once for 1 dose. 255 g 0   allopurinol (ZYLOPRIM) 300 MG tablet Take 1 tablet (300 mg total) by mouth daily. 90 tablet 3   aspirin EC 81 MG tablet Take 81 mg by mouth as needed.     Iron-FA-B Cmp-C-Biot-Probiotic (FUSION PLUS) CAPS Take 1 capsule by mouth daily. 30 capsule 5   nitroGLYCERIN (NITROSTAT) 0.4 MG SL tablet Place 1 tablet (0.4 mg total) under the tongue every 5 (five) minutes as needed for chest pain. 30 tablet 0   Potassium 99 MG TABS Take 297 mg by mouth daily.      No current facility-administered medications for this visit.     Review of Systems Full ROS  was asked and was negative except for the information on the HPI  Physical Exam Blood pressure (!) 146/77, pulse 88, temperature 98.2 F (36.8 C), temperature source Oral, height '5\' 10"'$  (1.778 m), weight 218 lb 12.8 oz (99.2 kg), SpO2 96 %. CONSTITUTIONAL: NAD. EYES: Pupils are equal, round, , Sclera are non-icteric. EARS, NOSE, MOUTH AND THROAT: T. The oral mucosa is pink and moist. Hearing is intact to voice. LYMPH NODES:  Lymph nodes in the neck are normal. RESPIRATORY:  Lungs are clear.  There is normal respiratory effort, with equal breath sounds bilaterally, and without pathologic use of accessory muscles. CARDIOVASCULAR: Heart is regular without murmurs, gallops, or rubs. GI: The abdomen is  soft, nontender, and nondistended. There are no palpable masses. There is no hepatosplenomegaly. There are normal bowel sounds in all quadrants. GU: Rectal deferred.   MUSCULOSKELETAL: Normal muscle strength and tone. No cyanosis or edema.   SKIN: Turgor is good and there are no pathologic skin lesions or ulcers. NEUROLOGIC: Motor and sensation is grossly normal. Cranial nerves are grossly intact. PSYCH:  Oriented to person, place and time. Affect is normal.  Data Reviewed  I have personally reviewed the patient's imaging, laboratory findings and medical records.    Assessment/Plan 87 year old male with adenocarcinoma of the hepatic flexure/proximal transverse colon.  There is no evidence of distant metastatic disease Discussed with the patient in detail about his disease process.  I do think the next step would be to perform a right colectomy.  I do think that he will be a good candidate for hand-assisted laparoscopic right hemicolectomy with primary anastomosis.  Procedure discussed with the patient in detail.  Risks, benefits and possible complications including but not limited to: Bleeding, infection injury to adjacent structures, anastomotic leak and cardiovascular complications.  He understands and wished to proceed. We will make sure we obtain cardiac evaluation preoperatively and tentatively schedule him for July 18. Please Note  that I spent greater than 60 minutes in this encounter including coordination of his care, counseling the patient, placing orders and performing appropriate documentation.   Caroleen Hamman, MD FACS General Surgeon 09/19/2021, 5:07 PM

## 2021-09-20 ENCOUNTER — Telehealth: Payer: Self-pay

## 2021-09-20 ENCOUNTER — Telehealth: Payer: Self-pay | Admitting: Cardiology

## 2021-09-28 ENCOUNTER — Telehealth: Payer: Self-pay | Admitting: Cardiology

## 2021-09-28 NOTE — Telephone Encounter (Signed)
   Pre-operative Risk Assessment    Patient Name: Phillip Nelson  DOB: [birth year] MRN: [id number]{      Request for Surgical Clearance    Procedure:   COLON ADENRARUNOMA  Date of Surgery:  Clearance 10/11/21                               Surgeon:  DR Caroleen Hamman Surgeon's Group or Practice Name:  [facility] Phone number:  [phone] Fax number:  [phone]   Type of Clearance Requested:   - Medical    Type of Anesthesia:  Not Indicated   Additional requests/questions:    Signed, [name]   09/28/2021, 12:26 PM

## 2021-09-28 NOTE — Telephone Encounter (Signed)
S/w the pt and he prefers an IN OFFICE appt as he would like to have an EKG as well. Pt has been scheduled for 09/29/21 @ 11:20 with Almyra Deforest, PAC.

## 2021-09-28 NOTE — Progress Notes (Deleted)
Cardiology Office Note:    Date:  09/28/2021   ID:  Phillip Nelson, DOB Mar 15, 1935, MRN [id number]  PCP:  [name], [place] Providers Cardiologist:  Glenetta Hew, MD { Click to update primary MD,subspecialty MD or APP then REFRESH:1}  *** Referring MD: [name], MD   Chief Complaint:  No chief complaint on file. {Click here for Visit Info    :1}   Patient Profile: ***  CAD >> NSTEMI in 2005 and s/p CABG with [place] to LAD (2005); Nonischemic Cardiolite (07/2016) Stable angina pectoris Hx of heart attack Bilateral carotid artery stenosis AVM of duodenum, acquired OSA >> Noncompliant with CPAP; Confirmed with sleep study on 3/17  Hx of GI bleed Colon adenocarcinoma, stage unknown Hyperlipidemia >> Hx of intolerance to statins (d/t myalgias) Hypertension Anemia Prediabetes Former smoker- Quit Date 08/01/1987   Prior CV Studies: {Select studies to display:26339}  ***  CT Chest, Abdomen, Pelvis with contrast (09/09/21): Did not show any acute cardiovascular findings. Aortic and coronary atherosclerosis calcification was noted incidentally. Short -segment annular wall thickening involving the hepatic flexure of the colon, consistent with primary colon carcinoma.    2D Echo on 07/29/2021 showed LVEF of 55-60%. Left ventricular diastolic parameters are consistent with grade 1 diastolic dysfunction. Tricuspid regurg signal is inadequate for assessing PA pressure. Mild tricuspid valve regurg noted. Aortic valve sclerosis/calcification is present.     History of Present Illness:   Phillip Nelson is a 87 y.o. male with the above problem list.  He has been a patient of Dr. Nehemiah Massed who saw him for shortness of breath and angina in 05/2016. He described his CP as stinging in the left chest with radiation to the left shoulder, occurring at rest, and relieved by rest and position changes. He had associated shortness of breath that lasted intermittently,  which worsened with increased frequency. A SPECT, ECG stress test, and Echo were ordered. F/U of myoview and echo revealed non diagnostic changes on ECG, LVEF of 56% Indeterminant treadmill EKG due to baseline EKG changes, Normal myocardial perfusion without evidence of myocardial ischemia. He saw Dr. Cammie Sickle again on 11/18/2020 for chest pain. CP was described as random, retrosternal, dull, heavy, non radiating, relieved by position change and rest. Associated with shortness of breath lasting intermittently over 1 week and worsening with increased frequency and severity.  Carotid dopplers, 2D Echo, and Exercise Cardiolite were ordered but studies were not done.    He established cardiac care with [facility] on July 14, 2021. Last Cardiology visit on 08/04/2021 with Dr. [name] for f/u  of his CAD and after his Echo (from 07/29/2021). Pt reported his right side chest pain as sharp and nagging. He also reported DOE (most likely due to deconditioning). At this visit, Dr. [name] did not believe his CP was anginal and was thought to be musculoskeletal. Pt declined myoview that Dr. [name] suggested. Overall Normal echo from 07/29/21 with EF of 40-10%, grade 1 diastolic dysfunction, mild calcification of aortic valve. He was started on Crestor 10 mg. He declined taking a beta blocker.   Today he presents to the clinic for preop clearance. He is scheduled to have the following procedure done:  a laparoscopic right colectomy - RNFA to assist that will be performed on 10/11/2021. Surgeon will be Dr. Caroleen Hamman on [place].   Today from a cardiac standpoint ***         Past Medical History:  Diagnosis Date  Aural vertigo    Basal cell carcinoma 12/31/2006   L mid infraclavicular. Excised 02/27/2007, margins free.   Basal cell carcinoma 10/30/2016    R post shoulder. Nodular pattern.   Basal cell carcinoma 03/31/2020   L lat pretibial - ED&C    Colon polyps    Coronary artery disease  involving native coronary artery of native heart with other form of angina pectoris ([place]) 09/2003   [facility]-Dr. Nehemiah Massed- transitioned to Dr. [name] [facility] 06/2021: -- 09/2003: Non-STEMI (Sx= heaviness in chest ~100lb brick, no SOB) = Cath w/ 95% ostial LAD with diffuse 50% stenosis -> referred for CABG x1 (LIMA-LAD); Non-Ischemic Cardiolite 07/2016   Diverticulitis    ED (erectile dysfunction)    Frequent headaches    Gout, arthritis    History of skin cancer    Hyperlipidemia    OSA (obstructive sleep apnea)    CPAP NON-COMPLIANT   Prostate cancer ([place]) 05/08/2012   Adenocarcinoma,gleason=3+3=6,PSA=8.26,vol=57cc   S/P CABG x 1 10/19/2003   [facility];Tobey Grim, IV, MD: LIMA-LAD   Squamous cell carcinoma, arm, left    L forearm prox dorsum    Squamous cell carcinoma, arm, left    L forearm prox medial    Squamous cell carcinoma, arm, right 08/23/2017   R mid volar forearm   Current Medications: No outpatient medications have been marked as taking for the 09/29/21 encounter (Appointment) with Almyra Deforest, PA.    Allergies:   Indocin [indomethacin] and Penicillins   Social History   Tobacco Use   Smoking status: Former    Packs/day: 3.00    Years: 31.00    Total pack years: 93.00    Types: Cigarettes    Quit date: 08/01/1987    Years since quitting: 34.1   Smokeless tobacco: Never  Vaping Use   Vaping Use: Never used  Substance Use Topics   Alcohol use: Yes    Comment: once a year on NYE   Drug use: No    Family Hx: The patient's family history includes Alzheimer's disease in his father; Cerebral aneurysm in his mother; Lung cancer (age of onset: 29) in his sister.  ROS   EKGs/Labs/Other Test Reviewed:    EKG:  EKG is *** ordered today.  The ekg ordered today demonstrates ***  His ECG at that time in the office revealed *** :*** Sinus rhythm with sinus arrhythmia 1st degree AV block with occasional premature ventricular complexes Left axis deviation Right  bundle branch block Abnormal ECG   Recent Labs: 07/12/2021: Pro B Natriuretic peptide (BNP) 36.0 08/31/2021: ALT 21; BUN 17; Creatinine, Ser 1.04; Hemoglobin 10.8; Platelets 407; Potassium 4.3; Sodium 136   Recent Lipid Panel Recent Labs    07/12/21 1105  CHOL 179  TRIG 137.0  HDL 59.70  VLDL 27.4  LDLCALC 92     Risk Assessment/Calculations/Metrics:    { Click Here to Calculate RCRI      :654650354}  { Click Here to Calculate DASI      :656812751} {Select to add RCRI Risk (<1%=LOW; >/=1%=HIGH) (Optional):21036017}  {Select if HIGH (RCRI >/=1%) Risk (Optional):21036030} Recommendations: {2014 ACC/AHA Perioperative Guidelines  :21036001} Antiplatelet and/or Anticoagulation Recommendations: {Antiplatelet Recommendations                  :21036016} {Anticoagulation Recommendations           :21036019}        No BP recorded.  {Refresh Note OR Click here to enter BP  :1}***  Physical Exam:    VS:  There were no vitals taken for this visit.    Wt Readings from Last 3 Encounters:  09/19/21 218 lb 12.8 oz (99.2 kg)  08/31/21 219 lb 9.6 oz (99.6 kg)  08/24/21 214 lb (97.1 kg)    Physical Exam ***  Physical Exam      ASSESSMENT & PLAN:   No problem-specific Assessment & Plan notes found for this encounter. *** 1. Preop clearance 2. CAD s/p CABG 3. Bilateral carotid artery stenosis 4. Hypertension 5. Hyperlipidemia 6. Colon adenocarcinoma, stage unknown      {Are you ordering a CV Procedure (e.g. stress test, cath, DCCV, TEE, etc)?   Press F2        :546568127}   Dispo:  No follow-ups on file.   Medication Adjustments/Labs and Tests Ordered: Current medicines are reviewed at length with the patient today.  Concerns regarding medicines are outlined above.  Tests Ordered: No orders of the defined types were placed in this encounter.  Medication Changes: No orders of the defined types were placed in this encounter.  SignedFinis Bud, NP  09/28/2021 [place], [place], [place]  [address] Phone: [phone]; Fax: [phone]

## 2021-09-28 NOTE — Telephone Encounter (Signed)
   Patient Name: Phillip Nelson  DOB: 02/05/1935 MRN: [id number]  Primary Cardiologist: Glenetta Hew, MD  Chart reviewed as part of pre-operative protocol coverage.  This is a duplicate request.  Patient has appointment scheduled with Almyra Deforest, PA-C for preop clearance on 09/29/2021.  I will remove this request from the pre-op pool as separate APP input is not required at this time.     Lenna Sciara, NP 09/28/2021, 4:58 PM

## 2021-09-28 NOTE — Telephone Encounter (Signed)
Patient is following-up on the status of his clearance and he is wanting to do a EKG prior to the surgery.

## 2021-09-28 NOTE — Telephone Encounter (Signed)
   Name: Phillip Nelson  DOB: 1934/09/08  MRN: [id number]  Primary Cardiologist: Glenetta Hew, MD   Preoperative team, please contact this patient and set up a phone call appointment for further preoperative risk assessment. Patient is requesting EKG prior to procedure.  Preoperative team may place order for EKG nurse visit prior to surgery. However, if patient prefers an office visit with EKG, okay to arrange for outpatient visit instead of telephone visit.  Please obtain consent and complete medication review. Thank you for your help.  I confirm that guidance regarding antiplatelet and oral anticoagulation therapy has been completed and, if necessary, noted below.    Lenna Sciara, NP 09/28/2021, 10:57 AM [place] [address] [place], [place] [address]

## 2021-09-29 ENCOUNTER — Encounter
Admission: RE | Admit: 2021-09-29 | Discharge: 2021-09-29 | Disposition: A | Discharge status: Home / Self Care | Payer: Medicare Other | Source: Ambulatory Visit | Attending: Surgery | Admitting: Surgery

## 2021-09-29 ENCOUNTER — Other Ambulatory Visit: Payer: Self-pay

## 2021-09-29 ENCOUNTER — Ambulatory Visit: Payer: Medicare Other | Admitting: Physician Assistant

## 2021-09-29 DIAGNOSIS — D509 Iron deficiency anemia, unspecified: Secondary | ICD-10-CM

## 2021-09-29 HISTORY — DX: Anemia, unspecified: D64.9

## 2021-09-29 NOTE — Patient Instructions (Addendum)
Your procedure is scheduled on:10/11/2021   Report to the Registration Desk on the 1st floor of the [facility].  To find out your arrival time, please call [phone] between 1PM - 3PM on: 10/10/2021  If your arrival time is 6:00 am, do not arrive prior to that time as the [facility] entrance doors do not open until 6:00 am.   REMEMBER: Instructions that are not followed completely may result in serious medical risk, up to and including death; or upon the discretion of your surgeon and anesthesiologist your surgery may need to be rescheduled.  Do not eat food after midnight the night before surgery.  No gum chewing, lozengers or hard candies.  You may however, drink water  up to 2 hours before you are scheduled to arrive for your surgery. Do not drink anything within 2 hours of your scheduled arrival time.   Follow recommendations from Cardiologist, Pulmonologist or PCP regarding stopping Aspirin.   One week prior to surgery: Stop Anti-inflammatories (NSAIDS) such as Advil, Aleve, Ibuprofen, Motrin, Naproxen, Naprosyn and Aspirin based products such as Excedrin, Goodys Powder, BC Powder. Stop ANY OVER THE COUNTER supplements until after surgery like B-12 You may however, continue to take Tylenol if needed for pain up until the day of surgery.   Please follow your bowel prep(miralax) instructions.     No Alcohol for 24 hours before or after surgery.  No Smoking including e-cigarettes for 24 hours prior to surgery.  No chewable tobacco products for at least 6 hours prior to surgery.  No nicotine patches on the day of surgery.  Do not use any "recreational" drugs for at least a week prior to your surgery.  Please be advised that the combination of cocaine and anesthesia may have negative outcomes, up to and including death. If you test positive for cocaine, your surgery will be cancelled.  On the morning of surgery brush your teeth with toothpaste and water, you may rinse your  mouth with mouthwash if you wish. Do not swallow any toothpaste or mouthwash.  Use CHG Soap  as directed on instruction sheet.   Do not wear jewelry, make-up, hairpins, clips or nail polish.  Do not wear lotions, powders, or perfumes.   Do not shave body from the neck down 48 hours prior to surgery just in case you cut yourself which could leave a site for infection.  Also, freshly shaved skin may become irritated if using the CHG soap.  Contact lenses, hearing aids and dentures may not be worn into surgery.  Do not bring valuables to the hospital. [facility] is not responsible for any missing/lost belongings or valuables.   Notify your doctor if there is any change in your medical condition (cold, fever, infection).  Wear comfortable clothing (specific to your surgery type) to the hospital.  After surgery, you can help prevent lung complications by doing breathing exercises.  Take deep breaths and cough every 1-2 hours. Your doctor may order a device called an Incentive Spirometer to help you take deep breaths. When coughing or sneezing, hold a pillow firmly against your incision with both hands. This is called "splinting." Doing this helps protect your incision. It also decreases belly discomfort.  If you are being admitted to the hospital overnight, leave your suitcase in the car. After surgery it may be brought to your room.  If you are being discharged the day of surgery, you will not be allowed to drive home. You will need a responsible adult (  18 years or older) to drive you home and stay with you that night.   If you are taking public transportation, you will need to have a responsible adult (18 years or older) with you. Please confirm with your physician that it is acceptable to use public transportation.   Please call the [facility] Dept. at [phone] if you have any questions about these instructions.  Surgery Visitation Policy:  Patients undergoing a  surgery or procedure may have two family members or support persons with them as long as the person is not COVID-19 positive or experiencing its symptoms.   Inpatient Visitation:    Visiting hours are 7 a.m. to 8 p.m. Up to four visitors are allowed at one time in a patient room, including children. The visitors may rotate out with other people during the day. One designated support person (adult) may remain overnight.

## 2021-09-30 NOTE — Progress Notes (Deleted)
Cardiology Office Note    Date:  09/30/2021   ID:  Phillip Nelson, DOB 1934/09/02, MRN [id number]  PCP:  [name], MD  Cardiologist:  Glenetta Hew, MD  Electrophysiologist:  None   Chief Complaint: Preoperative cardiac risk stratification  History of Present Illness:   Phillip Nelson is a 87 y.o. male with history of CAD with NSTEMI status post one-vessel CABG in 09/2003, HTN, HLD, bilateral carotid artery stenosis, history of GI bleed, and OSA who presents for cardiac risk stratification.  He was previously followed by Dr. Nehemiah Massed with most recent ischemic evaluation in 2018 vita echo and stress test which respectively showed and EF of 50%, no RWMA, mild LVH, mild MR, normal RV systolic function RVSP; and no evidence of ischemia on stress test. At his visit with Dr. Nehemiah Massed in 10/2020, he noted worsening angina and dyspnea. Echo and stress test were recommend, though not done. More recently, he has transitioned his care to Dr. [name] in 06/2021. At that time, he noted a more sedentary lifestyle. He was the caregiver for his wife, who had a stroke and has dementia. He reported intermittent stinging chest discomfort that did not feel similar to his prior angina. He also noted worsening exertional dyspnea when playing golf. It was noted he declined cholesterol medication.  Plan was for an echo and Lexiscan MPI. Echo on 07/29/2021 showed an EF of 55-60%, no RWMA, Gr1Dd, normal RV systolic function and ventricular cavity size, aortic sclerosis without stenosis and an estimated right atrial pressure of 3 mmHg.   He was last seen by Dr. [name] on 08/04/2021, continuing to note intermittent right-sided chest discomfort that was nagging and sharp. He noted continued exertional dyspnea, when he was able to play golf. His chest pain was not felt to be angina, and was felt to be musculoskeletal. Stress test was deferred. He agreed to undergo a trial of Crestor.   He is scheduled for a  laparoscopic colectomy on 10/11/2021.  ***  Revised Cardiac Risk Index: *** [facility] Activity Status Index: ***   Labs independently reviewed: 08/2021 - Hgb 10.8, PLT 407, potassium 4.3, BUN 17, serum creatinine 1.04, albumin 4.2, AST/ALT normal 06/2021 - A1c 6.3, BNP 36, TC 179, TG 137, HDL 59, LDL 92 10/2018 - TSH normal  Past Medical History:  Diagnosis Date   Anemia    Aural vertigo    Basal cell carcinoma 12/31/2006   L mid infraclavicular. Excised 02/27/2007, margins free.   Basal cell carcinoma 10/30/2016    R post shoulder. Nodular pattern.   Basal cell carcinoma 03/31/2020   L lat pretibial - ED&C    Colon polyps    Coronary artery disease involving native coronary artery of native heart with other form of angina pectoris ([place]) 09/2003   Phillip Nelson-Dr. Nehemiah Massed- transitioned to Dr. [name] [facility] 06/2021: -- 09/2003: Non-STEMI (Sx= heaviness in chest ~100lb brick, no SOB) = Cath w/ 95% ostial LAD with diffuse 50% stenosis -> referred for CABG x1 (LIMA-LAD); Non-Ischemic Cardiolite 07/2016   Diverticulitis    ED (erectile dysfunction)    Frequent headaches    Gout, arthritis    History of skin cancer    Hyperlipidemia    OSA (obstructive sleep apnea)    CPAP NON-COMPLIANT   Prostate cancer ([place]) 05/08/2012   Adenocarcinoma,gleason=3+3=6,PSA=8.26,vol=57cc   S/P CABG x 1 10/19/2003   [facility];Tobey Grim, IV, MD: LIMA-LAD   Squamous cell carcinoma, arm, left    L forearm prox dorsum  Squamous cell carcinoma, arm, left    L forearm prox medial    Squamous cell carcinoma, arm, right 08/23/2017   R mid volar forearm    Past Surgical History:  Procedure Laterality Date   APPENDECTOMY  1999   Life was saved with emerg. surgery.    BILATERAL CARPAL TUNNEL RELEASE Right 06/28/2001   Right (endoscopic)   CARPECTOMY Right    wrist   CATARACT EXTRACTION W/ INTRAOCULAR LENS  IMPLANT, BILATERAL     COLONOSCOPY WITH PROPOFOL N/A 10/31/2016   Procedure: COLONOSCOPY  WITH PROPOFOL;  Surgeon: Christene Lye, MD;  Location: [facility] ENDOSCOPY;  Service: Endoscopy;  Laterality: N/A;   COLONOSCOPY WITH PROPOFOL N/A 08/24/2021   Procedure: COLONOSCOPY WITH PROPOFOL;  Surgeon: Lin Landsman, MD;  Location: [facility] ENDOSCOPY;  Service: Gastroenterology;  Laterality: N/A;   CORONARY ARTERY BYPASS GRAFT  10/19/2003   [facility], Tobey Grim, IV, MD -> LIMA-LAD (for Ostial LAD 95% & multiple sequential 50% lesions.   DORSAL COMPARTMENT RELEASE Left 06/25/2017   Procedure: LEFT WRIST TENDON SHEATH RELEASE;  Surgeon: Milly Jakob, MD;  Location: [place];  Service: Orthopedics;  Laterality: Left;   EMBOLIZATION N/A 11/21/2018   Procedure: EMBOLIZATION ([place]);  Surgeon: Katha Cabal, MD;  Location: [place] [facility];  Service: Cardiovascular;  Laterality: N/A;   ESOPHAGOGASTRODUODENOSCOPY (EGD) WITH PROPOFOL N/A 08/24/2021   Procedure: ESOPHAGOGASTRODUODENOSCOPY (EGD) WITH PROPOFOL;  Surgeon: Lin Landsman, MD;  Location: [facility] ENDOSCOPY;  Service: Gastroenterology;  Laterality: N/A;   FOOT SURGERY Bilateral    GOUT   KNEE ARTHROSCOPY Bilateral 1986   Right: 1986; left 09/06/2009   LEFT ELBOW SURGERY  AGE 15  &  2007   LEFT HEART CATH AND CORONARY ANGIOGRAPHY  10/19/2003   [facility] (non-STEMI): Ostial LAD 95% with multiple sequential 50% stenoses. => Referred for CABG   NM GATED MYOCARDIAL STUDY (ARMX HX)  07/31/2016   Treadmill Myoview/Cardiolite : [facility]): 4.6 METS, 92 % MPHR.  EF 55 to 60%.  Normal wall motion.  No ischemia or infarction.   OLECRANON BURSA EXCISION Right 06/28/2001   OLECRANON BURSA EXCISION Left 06/24/2007   RADIOACTIVE SEED IMPLANT N/A 08/02/2012   Procedure: RADIOACTIVE SEED IMPLANT;  Surgeon: Claybon Jabs, MD;  Location[facility];  Service: Urology;  Laterality: N/A;   TOTAL KNEE ARTHROPLASTY Bilateral 04/14/2004   RIGHT 2006; LEFT 11/21/2010   TRANSTHORACIC  ECHOCARDIOGRAM  07/31/2016   ([facility]) normal LV function.  Mild LVH.  EF 50%.  Normal RV.  Mild MR, TR and PR.  No stenosis.    Current Medications: No outpatient medications have been marked as taking for the 10/03/21 encounter (Appointment) with Rise Mu, PA-C.    Allergies:   Indocin [indomethacin] and Penicillins   Social History   Socioeconomic History   Marital status: Soil scientist    Spouse name: Blanch Media   Number of children: 1   Years of education: Not on file   Highest education level: Some college, no degree  Occupational History   Occupation: Retired  Tobacco Use   Smoking status: Former    Packs/day: 3.00    Years: 31.00    Total pack years: 93.00    Types: Cigarettes    Quit date: 08/01/1987    Years since quitting: 34.1   Smokeless tobacco: Never  Vaping Use   Vaping Use: Never used  Substance and Sexual Activity   Alcohol use: Yes    Comment: once  a year on NYE   Drug use: No   Sexual activity: Not Currently  Other Topics Concern   Not on file  Social History Narrative   09/22/19   From: the area   Living: with partner - Bunnie Philips (dementia) 1984   Work: retired - Administrator, arts [employer]      Family: 1 son - but does not have a relationship      Enjoys: golf, reading      Exercise: golf once a week, yardwork   Diet: not great due to caring for wife -> also limits his activity level.  He does all of the housework including cooking, cleaning, laundry, vacuuming and yard work.      Safety   Seat belts: Yes    Guns: Yes  and secure   Safe in relationships: Yes    Social Determinants of Health   Financial Resource Strain: Low Risk  (09/12/2021)   Overall Financial Resource Strain (CARDIA)    Difficulty of Paying Living Expenses: Not hard at all  Food Insecurity: No Food Insecurity (09/12/2021)   Hunger Vital Sign    Worried About Running Out of Food in the Last Year: Never true    Ran Out of Food in the Last Year: Never  true  Transportation Needs: No Transportation Needs (09/12/2021)   PRAPARE - Hydrologist (Medical): No    Lack of Transportation (Non-Medical): No  Physical Activity: Insufficiently Active (09/12/2021)   Exercise Vital Sign    Days of Exercise per Week: 3 days    Minutes of Exercise per Session: 30 min  Stress: No Stress Concern Present (09/12/2021)   [place]    Feeling of Stress : Not at all  Social Connections: Unknown (09/12/2021)   Social Connection and Isolation Panel [NHANES]    Frequency of Communication with Friends and Family: Three times a week    Frequency of Social Gatherings with Friends and Family: Once a week    Attends Religious Services: Not on Advertising copywriter or Organizations: No    Attends Archivist Meetings: Never    Marital Status: Living with partner     Family History:  The patient's family history includes Alzheimer's disease in his father; Cerebral aneurysm in his mother; Lung cancer (age of onset: 24) in his sister.  ROS:   ROS   EKGs/Labs/Other Studies Reviewed:    Studies reviewed were summarized above. The additional studies were reviewed today:  2D 12-Aug-2021: 1. Left ventricular ejection fraction, by estimation, is 55 to 60%. The  left ventricle has normal function. The left ventricle has no regional  wall motion abnormalities. Left ventricular diastolic parameters are  consistent with Grade I diastolic  dysfunction (impaired relaxation).   2. Right ventricular systolic function is normal. The right ventricular  size is normal. Tricuspid regurgitation signal is inadequate for assessing  PA pressure.   3. The mitral valve is normal in structure. No evidence of mitral valve  regurgitation. No evidence of mitral stenosis.   4. The aortic valve is normal in structure. Aortic valve regurgitation is  not visualized. Aortic valve  sclerosis/calcification is present, without  any evidence of aortic stenosis. Aortic valve area, by VTI measures 2.75  cm.   5. The inferior vena cava is normal in size with greater than 50%  respiratory variability, suggesting right atrial pressure of 3 mmHg.  EKG:  EKG is ordered today.  The EKG ordered today demonstrates ***  Recent Labs: 07/12/2021: Pro B Natriuretic peptide (BNP) 36.0 08/31/2021: ALT 21; BUN 17; Creatinine, Ser 1.04; Hemoglobin 10.8; Platelets 407; Potassium 4.3; Sodium 136  Recent Lipid Panel    Component Value Date/Time   CHOL 179 07/12/2021 1105   TRIG 137.0 07/12/2021 1105   HDL 59.70 07/12/2021 1105   CHOLHDL 3 07/12/2021 1105   VLDL 27.4 07/12/2021 1105   LDLCALC 92 07/12/2021 1105   LDLCALC 96 10/29/2017 0959    PHYSICAL EXAM:    VS:  There were no vitals taken for this visit.  BMI: There is no height or weight on file to calculate BMI.  Physical Exam  Wt Readings from Last 3 Encounters:  09/19/21 218 lb 12.8 oz (99.2 kg)  08/31/21 219 lb 9.6 oz (99.6 kg)  08/24/21 214 lb (97.1 kg)     ASSESSMENT & PLAN:   Preoperative cardiac risk stratification:  CAD status post CABG:  HTN: Blood pressure ***  HLD: LDL ***  History of GI bleed: ***   {Are you ordering a CV Procedure (e.g. stress test, cath, DCCV, TEE, etc)?   Press F2        :[id number]}     Disposition: F/u with Dr. [name] or an APP in ***.   Medication Adjustments/Labs and Tests Ordered: Current medicines are reviewed at length with the patient today.  Concerns regarding medicines are outlined above. Medication changes, Labs and Tests ordered today are summarized above and listed in the Patient Instructions accessible in Encounters.   Signed, Christell Faith, PA-C 09/30/2021 10:00 AM     [address] [place], [place] [address] [phone]

## 2021-10-03 ENCOUNTER — Ambulatory Visit: Payer: Medicare Other | Admitting: Family Medicine

## 2021-10-03 ENCOUNTER — Telehealth: Payer: Self-pay | Admitting: Cardiology

## 2021-10-03 ENCOUNTER — Ambulatory Visit: Payer: Medicare Other | Admitting: Physician Assistant

## 2021-10-03 ENCOUNTER — Encounter
Admission: RE | Admit: 2021-10-03 | Discharge: 2021-10-03 | Disposition: A | Discharge status: Home / Self Care | Payer: Medicare Other | Source: Ambulatory Visit | Attending: Surgery | Admitting: Surgery

## 2021-10-03 DIAGNOSIS — Z01812 Encounter for preprocedural laboratory examination: Secondary | ICD-10-CM | POA: Diagnosis not present

## 2021-10-03 DIAGNOSIS — D509 Iron deficiency anemia, unspecified: Secondary | ICD-10-CM | POA: Insufficient documentation

## 2021-10-03 NOTE — Telephone Encounter (Signed)
Added to waitlist and closing encounter

## 2021-10-03 NOTE — Telephone Encounter (Signed)
It is unclear to me per the pre-op team if he needed an in office visit prior to his clearance, but per notes, the patient was requesting an EKG.  I think it would be best to arrange for an APP visit w/ EKG to clear up the matter and get his clearance taken care of without question and a lot of back and forth.   Please arrange for an APP visit.

## 2021-10-03 NOTE — Telephone Encounter (Signed)
Patient would like to know if he needs to be seen for his medical clearance. He states that he was the one who wanted an EKG before his surgery next week. Please call and advise.

## 2021-10-03 NOTE — Telephone Encounter (Signed)
Patient declined first available 07/17 - surgery is 07/18 Please call with sooner appt if available

## 2021-10-04 ENCOUNTER — Other Ambulatory Visit: Payer: Self-pay | Admitting: Family Medicine

## 2021-10-04 ENCOUNTER — Telehealth: Payer: Self-pay

## 2021-10-04 DIAGNOSIS — M1A9XX0 Chronic gout, unspecified, without tophus (tophi): Secondary | ICD-10-CM

## 2021-10-04 NOTE — Telephone Encounter (Signed)
[place] Night - Client Nonclinical Telephone Record  [facility] Client [facility] Night - Client Client Site [place] Provider [name] Contact Type Call Who Is Calling Patient / Member / Family / Caregiver Caller Name [place] Phone Number [phone] Patient Name Phillip Nelson Patient DOB Sep 08, 1934 Call Type Message Only Information Provided Reason for Call Request for General Office Information Initial Comment Caller states he needs an EKG done so he can have surgery on 07/25. Additional Comment [phone] Disp. Time Disposition Final User 10/03/2021 9:01:59 PM General Information Provided Yes Zane Herald Call Closed By: Zane Herald Transaction Date/Time: 10/03/2021 8:58:22 PM (ET    I spoke with pt and pt already has appt with Dr Einar Pheasant on 10/07/21 for EKG for medical clearance. Pt said cardiology could not schedule medical clearance appt prior to surgery to remove 1 foot of colon on 10/11/21. Sending note to Dr Einar Pheasant and [facility] CMA.

## 2021-10-05 ENCOUNTER — Encounter: Payer: Self-pay | Admitting: Surgery

## 2021-10-05 ENCOUNTER — Encounter: Payer: Self-pay | Admitting: Physician Assistant

## 2021-10-05 ENCOUNTER — Ambulatory Visit (INDEPENDENT_AMBULATORY_CARE_PROVIDER_SITE_OTHER): Payer: Medicare Other | Admitting: Physician Assistant

## 2021-10-05 VITALS — BP 146/76 | HR 77 | Ht 70.0 in | Wt 217.2 lb

## 2021-10-05 DIAGNOSIS — I25810 Atherosclerosis of coronary artery bypass graft(s) without angina pectoris: Secondary | ICD-10-CM

## 2021-10-05 DIAGNOSIS — E785 Hyperlipidemia, unspecified: Secondary | ICD-10-CM

## 2021-10-05 DIAGNOSIS — Z01818 Encounter for other preprocedural examination: Secondary | ICD-10-CM

## 2021-10-05 NOTE — Patient Instructions (Signed)
Medication Instructions:  Your physician recommends that you continue on your current medications as directed. Please refer to the Current Medication list given to you today.  *If you need a refill on your cardiac medications before your next appointment, please call your pharmacy*   Lab Work: NONE If you have labs (blood work) drawn today and your tests are completely normal, you will receive your results only by: [place] (if you have MyChart) OR A paper copy in the mail If you have any lab test that is abnormal or we need to change your treatment, we will call you to review the results.   Testing/Procedures: NONE   Follow-Up: At [facility], you and your health needs are our priority.  As part of our continuing mission to provide you with exceptional heart care, we have created designated Provider Care Teams.  These Care Teams include your primary Cardiologist (physician) and Advanced Practice Providers (APPs -  Physician Assistants and Nurse Practitioners) who all work together to provide you with the care you need, when you need it.  We recommend signing up for the patient portal called "MyChart".  Sign up information is provided on this After Visit Summary.  MyChart is used to connect with patients for Virtual Visits (Telemedicine).  Patients are able to view lab/test results, encounter notes, upcoming appointments, etc.  Non-urgent messages can be sent to your provider as well.   To learn more about what you can do with MyChart, go to [email or web address].    Other Instructions Phillip Nelson Will give you a call later after discussing your case in depth with Dr. [name]. Please keep your upcoming appointment with Dr. [name].

## 2021-10-05 NOTE — Telephone Encounter (Signed)
   Patient Name: Phillip Nelson  DOB: [birth year] MRN: [id number]  Primary Cardiologist: Glenetta Hew, MD  Chart reviewed as part of pre-operative protocol coverage. Given past medical history and time since last visit, based on ACC/AHA guidelines, Phillip Nelson would be at acceptable risk for the planned procedure without further cardiovascular testing.   Patient was seen in the office today for preoperative clearance.  His EKG is unchanged.  He is able to accomplish about 8 METS of activity.   The patient was advised that if he develops new symptoms prior to surgery to contact our office to arrange for a follow-up visit, and he verbalized understanding.  I will route this recommendation to the requesting party via Epic fax function and remove from pre-op pool.  Please call with questions.  [place], [place] 10/05/2021, 4:53 PM

## 2021-10-05 NOTE — Progress Notes (Signed)
Cardiology Office Note:    Date:  10/07/2021   ID:  Phillip Nelson, DOB 1935-02-19, MRN [id number]  PCP:  Phillip Nelson   [place] Providers Cardiologist:  Phillip Hew, Nelson     Referring Nelson: Phillip Nelson   Chief Complaint  Patient presents with   Follow-up    Seen for Phillip Nelson   Pre-op Exam    Upcoming R lap colectomy surgery    History of Present Illness:    Phillip Nelson is a 87 y.o. male with a hx of CAD s/p CABG x 25 September 2003, hyperlipidemia, OSA, carotid artery disease, history of myalgia related to statins and history of GI bleed.  Patient was previously followed by Dr. Nehemiah Nelson of [facility].  Echo and a stress test obtained in 2018 was normal.  Echocardiogram and stress test was reordered in 2022 for shortness of breath and angina, however they were never done.  Patient was established with Phillip Nelson in April 2023.  Repeat echocardiogram obtained on 07/29/2021 showed EF 55 to 60%, normal wall motion, grade 1 DD.  Patient was last seen by Phillip Nelson on 08/04/2021 at which time he continued to note on and off right-sided sharp chest discomfort.  It was felt his chest pain is unlikely to be cardiac and the more likely to be musculoskeletal.  Patient is relatively sedentary at home being a caregiver for his wife.  He was more short of breath than usual.  Crestor was started at the time.  Patient has been diagnosed with adenocarcinoma of hepatic flexure and proximal transverse colon and required to undergo laparoscopic right colectomy.   Patient presents today for preop clearance prior to upcoming laparoscopic right colectomy surgery by Dr. Dahlia Nelson.  He denies any recent exertional chest pain or worsening dyspnea.  The previous atypical chest pain in May has completely resolved.  He never started on the Crestor due to fear of side effect associated with the previous statins.  The only cardiac medication he is on is 81 mg aspirin.  He denies any  limitations to his functional ability.  [place] and that he has no problem picking on the branches and the cleaning that weed off of his lawn by himself.  He does have a right lawnmower but he does the weed eating manually.  From the cardiac perspective, I think the patient is able to accomplish more than 4 METS of activity.  I discussed the case with Phillip Nelson, we suspect the patient is able to achieve at least 8 METS of activity.  Given lack of chest discomfort, Phillip Nelson did not recommend any additional evaluation, patient is at acceptable risk to proceed with upcoming surgery without further work-up.    Past Medical History:  Diagnosis Date   Adenocarcinoma of prostate ([place]) 05/08/2012   a.) Gleason = 3+3 = 6; PSA = 8.26; volume = 57cc; b.) s/p brachytherapy   Adenocarcinoma of transverse colon ([place]) 08/24/2021   a.) Bx (+) for moderately differentiated adenocarcinoma   Aortic atherosclerosis (HCC)    Aural vertigo    AVM (arteriovenous malformation) of duodenum, acquired    Basal cell carcinoma 12/31/2006   L mid infraclavicular. Excised 02/27/2007, margins free.   Basal cell carcinoma 10/30/2016    R post shoulder. Nodular pattern.   Basal cell carcinoma 03/31/2020   L lat pretibial - ED&C    Coronary artery disease involving native coronary artery of native  heart with other form of angina pectoris ([place]) 09/2003   a.) followed by [facility] Phillip Nelson); transitioned to [facility] Phillip Nelson) 06/2021; b.) NSTEMI 09/2003 (Sx= heaviness in chest ~100lb brick, no SOB); LHC --> 95% oLAD with diffuse 50% stenosis -> referred to CVTS; c.) s/p CABG x 1 (LIMA-LAD) 10/19/2003; d.) Non-Ischemic Cardiolite 0/9735   Diastolic dysfunction    a.) TTE 08/04/2014: EF 45%, mild global HK, mild LVH, mild MAC, mild TR/PR; b.) TTE 08/10/2016: EF 50%, mild LVH, mild MAC, mild MR/TR/PR; c.) TTE 07/29/2021: ED 55-60%, G1DD, Ao sclerosis with no stenosis.   Diverticulitis    DOE  (dyspnea on exertion)    ED (erectile dysfunction)    Frequent headaches    Gout, arthritis    Hemorrhoids    HTN (hypertension)    Hyperlipidemia    IDA (iron deficiency anemia)    Myalgia due to statin    NSTEMI (non-ST elevated myocardial infarction) ([place]) 10/17/2003   a.) LHC 10/18/2003 --> 95% oLAD with multiple 50% sequential lesions --> referred to CVTS. b.) CABG x 1 (LIMA-LAD) 10/19/2003.   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 surgery.    BILATERAL CARPAL TUNNEL RELEASE Right 06/28/2001   Right (endoscopic)   CARPECTOMY Right    wrist   CATARACT EXTRACTION W/ INTRAOCULAR LENS  IMPLANT, BILATERAL     COLONOSCOPY WITH PROPOFOL N/A 10/31/2016   Procedure: COLONOSCOPY WITH PROPOFOL;  Surgeon: Christene Lye, Nelson;  Location: [facility] ENDOSCOPY;  Service: Endoscopy;  Laterality: N/A;   COLONOSCOPY WITH PROPOFOL N/A 08/24/2021   Procedure: COLONOSCOPY WITH PROPOFOL;  Surgeon: Lin Landsman, Nelson;  Location: [facility] ENDOSCOPY;  Service: Gastroenterology;  Laterality: N/A;   CORONARY ARTERY BYPASS GRAFT  10/19/2003   Procedure: CORONARY ARTERY BYPASS GRAFT; Locaation: [place]; Surgeon:  Tobey Grim, IV, Nelson   DORSAL COMPARTMENT RELEASE Left 06/25/2017   Procedure: LEFT WRIST TENDON SHEATH RELEASE;  Surgeon: Milly Jakob, Nelson;  Location: [place];   Service: Orthopedics;  Laterality: Left;   EMBOLIZATION N/A 11/21/2018   Procedure: EMBOLIZATION (Col[place]);  Surgeon: Katha Cabal, Nelson;  Location: [place] CV LAB;  Service: Cardiovascular;  Laterality: N/A;   ESOPHAGOGASTRODUODENOSCOPY (EGD) WITH PROPOFOL N/A 08/24/2021   Procedure: ESOPHAGOGASTRODUODENOSCOPY (EGD) WITH PROPOFOL;  Surgeon: Lin Landsman, Nelson;  Location: [facility] ENDOSCOPY;  Service: Gastroenterology;  Laterality: N/A;   FOOT SURGERY Bilateral    GOUT   KNEE ARTHROSCOPY Bilateral 1986   Right: 1986; left 09/06/2009   LEFT ELBOW SURGERY  AGE 76  &  2007   LEFT HEART CATH AND CORONARY ANGIOGRAPHY  10/18/2003   [facility] (non-STEMI): Ostial LAD 95% with multiple sequential 50% stenoses. => Referred for CABG   NM GATED MYOCARDIAL STUDY (ARMX HX)  07/31/2016   Treadmill Myoview/Cardiolite : [facility]): 4.6 METS, 92 % MPHR.  EF 55 to 60%.  Normal wall motion.  No ischemia or infarction.   OLECRANON BURSA EXCISION Right 06/28/2001   OLECRANON BURSA EXCISION Left 06/24/2007   RADIOACTIVE SEED IMPLANT N/A 08/02/2012   Procedure: RADIOACTIVE SEED IMPLANT;  Surgeon: Claybon Jabs, Nelson;  Location: [facility];  Service: Urology;  Laterality: N/A;   TOTAL KNEE ARTHROPLASTY Bilateral 04/14/2004   RIGHT 2006; LEFT 11/21/2010   TRANSTHORACIC ECHOCARDIOGRAM  07/31/2016   ([facility]) normal LV function.  Mild LVH.  EF 50%.  Normal RV.  Mild MR, TR and PR.  No stenosis.    Current Medications: Current Meds  Medication Sig   allopurinol (ZYLOPRIM) 300 MG tablet Take 1 tablet (300 mg total) by mouth daily.   aspirin EC 81 MG tablet Take 81 mg by mouth as needed.   bisacodyl (DULCOLAX) 5 MG EC tablet Take 5 mg by mouth once. Before surgery   Iron-FA-B Cmp-C-Biot-Probiotic (FUSION PLUS) CAPS Take 1 capsule by mouth daily.   metroNIDAZOLE (FLAGYL) 500 MG tablet Take 2 tablets at 8 AM, take 2 tablets at 2 PM, and take 2 tablets at 8 PM the day prior  to your surgery.   neomycin (MYCIFRADIN) 500 MG tablet Take 2 tablets (1,000 mg total) by mouth 3 (three) times daily.   nitroGLYCERIN (NITROSTAT) 0.4 MG SL tablet Place 1 tablet (0.4 mg total) under the tongue every 5 (five) minutes as needed for chest pain.   polyethylene glycol (MIRALAX / GLYCOLAX) 17 g packet Take 17 g by mouth daily.   Potassium 99 MG TABS Take 297 mg by mouth daily.    vitamin B-12 (CYANOCOBALAMIN) 1000 MCG tablet Take 1,000 mcg by mouth daily.     Allergies:   Indocin [indomethacin] and Penicillins   Social History   Socioeconomic History   Marital status: Soil scientist    Spouse name: Blanch Media   Number of children: 1   Years of education: Not on file   Highest education level: Some college, no degree  Occupational History   Occupation: Retired  Tobacco Use   Smoking status: Former    Packs/day: 3.00    Years: 31.00    Total pack years: 93.00    Types: Cigarettes    Quit date: 08/01/1987    Years since quitting: 34.2   Smokeless tobacco: Never  Vaping Use   Vaping Use: Never used  Substance and Sexual Activity   Alcohol use: Yes    Comment: once a year on NYE   Drug use: No   Sexual activity: Not Currently  Other Topics Concern   Not on file  Social History Narrative   09/22/19   From: the area   Living: with partner - Bunnie Philips (dementia) 1984   Work: retired - Investment banker, 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  He does all of the housework including cooking, cleaning, laundry, vacuuming and yard work.      Safety   Seat belts: Yes    Guns: Yes  and secure   Safe in relationships: Yes    Social Determinants of Health   Financial Resource Strain: Low Risk  (  09/12/2021)   Overall Financial Resource Strain (CARDIA)    Difficulty of Paying Living Expenses: Not hard  at all  Food Insecurity: No Food Insecurity (09/12/2021)   Hunger Vital Sign    Worried About Running Out of Food in the Last Year: Never true    Ran Out of Food in the Last Year: Never true  Transportation Needs: No Transportation Needs (09/12/2021)   PRAPARE - Hydrologist (Medical): No    Lack of Transportation (Non-Medical): No  Physical Activity: Insufficiently Active (09/12/2021)   Exercise Vital Sign    Days of Exercise per Week: 3 days    Minutes of Exercise per Session: 30 min  Stress: No Stress Concern Present (09/12/2021)   [place]    Feeling of Stress : Not at all  Social Connections: Unknown (09/12/2021)   Social Connection and Isolation Panel [NHANES]    Frequency of Communication with Friends and Family: Three times a week    Frequency of Social Gatherings with Friends and Family: Once a week    Attends Religious Services: Not on Advertising copywriter or Organizations: No    Attends Archivist Meetings: Never    Marital Status: Living with partner     Family History: The patient's family history includes Alzheimer's disease in his father; Cerebral aneurysm in his mother; Lung cancer (age of onset: 80) in his sister.  ROS:   Please see the history of present illness.     All other systems reviewed and are negative.  EKGs/Labs/Other Studies Reviewed:    The following studies were reviewed today:  Echo 07/29/2021  1. Left ventricular ejection fraction, by estimation, is 55 to 60%. The  left ventricle has normal function. The left ventricle has no regional  wall motion abnormalities. Left ventricular diastolic parameters are  consistent with Grade I diastolic  dysfunction (impaired relaxation).   2. Right ventricular systolic function is normal. The right ventricular  size is normal. Tricuspid regurgitation signal is inadequate for assessing  PA  pressure.   3. The mitral valve is normal in structure. No evidence of mitral valve  regurgitation. No evidence of mitral stenosis.   4. The aortic valve is normal in structure. Aortic valve regurgitation is  not visualized. Aortic valve sclerosis/calcification is present, without  any evidence of aortic stenosis. Aortic valve area, by VTI measures 2.75  cm.   5. The inferior vena cava is normal in size with greater than 50%  respiratory variability, suggesting right atrial pressure of 3 mmHg.   EKG:  EKG is ordered today.  The ekg ordered today demonstrates normal sinus rhythm, first-degree AV block, right bundle branch block.  Recent Labs: 07/12/2021: Pro B Natriuretic peptide (BNP) 36.0 08/31/2021: ALT 21; BUN 17; Creatinine, Ser 1.04; Hemoglobin 10.8; Platelets 407; Potassium 4.3; Sodium 136  Recent Lipid Panel    Component Value Date/Time   CHOL 179 07/12/2021 1105   TRIG 137.0 07/12/2021 1105   HDL 59.70 07/12/2021 1105   CHOLHDL 3 07/12/2021 1105   VLDL 27.4 07/12/2021 1105   LDLCALC 92 07/12/2021 1105   LDLCALC 96 10/29/2017 0959     Risk Assessment/Calculations:           Physical Exam:    VS:  BP (!) 146/76   Pulse 77   Ht _0  (1.778 m)   Wt 217 lb 3.2 oz (98.5 kg)   SpO2  98%   BMI 31.16 kg/m     Wt Readings from Last 3 Encounters:  10/05/21 217 lb 3.2 oz (98.5 kg)  09/19/21 218 lb 12.8 oz (99.2 kg)  08/31/21 219 lb 9.6 oz (99.6 kg)     GEN:  Well nourished, well developed in no acute distress HEENT: Normal NECK: No JVD; No carotid bruits LYMPHATICS: No lymphadenopathy CARDIAC: RRR, no murmurs, rubs, gallops RESPIRATORY:  Clear to auscultation without rales, wheezing or rhonchi  ABDOMEN: Soft, non-tender, non-distended MUSCULOSKELETAL:  No edema; No deformity  SKIN: Warm and dry NEUROLOGIC:  Alert and oriented x 3 PSYCHIATRIC:  Normal affect   ASSESSMENT:    1. Pre-operative clearance   2. Coronary artery disease involving coronary bypass  graft of native heart without angina pectoris   3. Hyperlipidemia with target LDL less than 70    PLAN:    In order of problems listed above:  Preoperative clearance: Patient was recently diagnosed with colon cancer and require laparoscopic right hemicolectomy surgery by Dr. Dahlia Nelson.  Patient denies any recent exertional chest pain or worsening dyspnea.  He has a history of CABG x1 in 2005.  Last Myoview was in 2018 that was normal.  He is fairly active and able to spend over an hour cleaning the weeding his yard.  He is able to accomplish more than 4 metabolic level of activity, from the cardiac perspective, he does not require any additional evaluation.  I discussed the case with Phillip Nelson his primary cardiologist who recommended proceed with upcoming surgery.  He is at acceptable risk to proceed.  Ideally, we prefer him to stay on aspirin, however if the bleeding risk is too high, if needed, he may hold aspirin for 5 to 7 days prior to the procedure and restart as soon as possible afterward at the surgeon's discretion.  CAD s/p CABG x1 in 2005: No recent chest discomfort.  On aspirin  Hyperlipidemia: Phillip Nelson previously recommended on Crestor, however patient never picked up the medication as he does not like to take statin medications.  He has a history of muscle ache associated with statins.  We will need to consider PCSK9 inhibitor in the future.           Medication Adjustments/Labs and Tests Ordered: Current medicines are reviewed at length with the patient today.  Concerns regarding medicines are outlined above.  Orders Placed This Encounter  Procedures   EKG 12-Lead   No orders of the defined types were placed in this encounter.   Patient Instructions  Medication Instructions:  Your physician recommends that you continue on your current medications as directed. Please refer to the Current Medication list given to you today.  *If you need a refill on your cardiac medications  before your next appointment, please call your pharmacy*   Lab Work: NONE If you have labs (blood work) drawn today and your tests are completely normal, you will receive your results only by: [place] (if you have MyChart) OR A paper copy in the mail If you have any lab test that is abnormal or we need to change your treatment, we will call you to review the results.   Testing/Procedures: NONE   Follow-Up: At [facility], you and your health needs are our priority.  As part of our continuing mission to provide you with exceptional heart care, we have created designated Provider Care Teams.  These Care Teams include your primary Cardiologist (physician) and Advanced Practice Providers (APPs -  Physician  Assistants and Nurse Practitioners) who all work together to provide you with the care you need, when you need it.  We recommend signing up for the patient portal called "MyChart".  Sign up information is provided on this After Visit Summary.  MyChart is used to connect with patients for Virtual Visits (Telemedicine).  Patients are able to view lab/test results, encounter notes, upcoming appointments, etc.  Non-urgent messages can be sent to your provider as well.   To learn more about what you can do with MyChart, go to [email or web address].    Other Instructions Almyra Deforest Will give you a call later after discussing your case in depth with Phillip Nelson. Please keep your upcoming appointment with Phillip Nelson.    Hilbert Corrigan, [place]  10/07/2021 12:13 AM    [place]

## 2021-10-06 ENCOUNTER — Encounter: Payer: Self-pay | Admitting: Surgery

## 2021-10-06 NOTE — Telephone Encounter (Signed)
Noted will see tomorrow 

## 2021-10-06 NOTE — Progress Notes (Addendum)
Perioperative Services  Pre-Admission/Anesthesia Testing Clinical Review  Date: 10/07/21  Patient Demographics:  Name: Phillip Nelson DOB:   May 14, 1934 MRN:   [id number]  Planned Surgical Procedure(s):    Case: [id number] Date/Time: 10/11/21 1255   Procedure: LAPAROSCOPIC RIGHT COLECTOMY   Anesthesia type: General   Pre-op diagnosis: Colon Cancer   Location: [facility] OR ROOM 07 / [place] ORS FOR ANESTHESIA GROUP   Surgeons: Jules Husbands, MD   NOTE: Available PAT nursing documentation and vital signs have been reviewed. Clinical nursing staff has updated patient's PMH/PSHx, current medication list, and drug allergies/intolerances to ensure comprehensive history available to assist in medical decision making as it pertains to the aforementioned surgical procedure and anticipated anesthetic course. Extensive review of available clinical information performed. [name] PMH and PSHx updated with any diagnoses/procedures that  may have been inadvertently omitted during his intake with the pre-admission testing department's nursing staff.  Clinical Discussion:  Phillip Nelson is a 87 y.o. male who is submitted for pre-surgical anesthesia review and clearance prior to him undergoing the above procedure.Patient is a Former Smoker (53 pack years; quit 07/1987). Pertinent PMH includes: CAD (s/p CABG), NSTEMI, RBBB with LAFB, aortic atherosclerosis, HTN, HLD, prediabetes, DOE, OSAH (does not use nocturnal PAP therapy; cannot tolerate), duodenal AVM, IDA, prostate cancer, colon cancer.  Patient is followed by cardiology [name], MD). He was last seen in the cardiology clinic on 10/05/2021; notes reviewed.  At time of visit, patient worked well overall from a cardiac perspective.  Denied any chest pain, however continued to have exertional dyspnea with reported to be at baseline.  Patient denies any PND, orthopnea, palpitations, significant peripheral edema, vertiginous symptoms, or  presyncope/syncope.  Patient with a past medical history significant for cardiovascular diagnoses.  Patient suffered an NSTEMI on 10/17/2003.  He underwent diagnostic left heart catheterization on 10/18/2003 revealing a 95% stenosis of the ostial LAD with multiple 50% sequential lesions.  Patient was referred to CVTS.  Patient underwent single-vessel CABG on 10/19/2003.  LIMA-LAD bypass graft was placed.  Myocardial perfusion imaging study performed on 07/31/2016 revealed a normal left ventricular systolic function with an EF of 56%.  There were no regional wall motion abnormalities.  There was no evidence of stress-induced myocardial ischemia or arrhythmia; no scintigraphic evidence of scar.  Patient able to achieve 92% MPHR and perform 4.6 METS of activity during the study.  Baseline ECG revealed a RBBB, thus making study indeterminant.  Last TTE was performed on 07/29/2021 revealing normal left ventricular systolic function with an EF of 55 to 60%. Diastolic Doppler parameters consistent with abnormal relaxation (G1DD).  Aortic valve sclerosis noted.  Mild tricuspid valve regurgitation observed.  There was no evidence of a significant transvalvular gradient to suggest stenosis.  Blood pressure mildly elevated 146/76 mmHg without any current pharmacological interventions in place.  Patient is not currently taking any type of lipid-lowering therapies for his HLD diagnosis and further ASCVD prevention.  Of note, he has experienced statin induced myalgias in the past and does not wish to be on these medications.  While not dicussed at this visit, PCSK9i is being considered as a treatment option for this patient in the future. Patient has a prediabetes diagnosis that is currently being controlled with diet and lifestyle modification; last HgbA1c 6.3% when checked on 07/12/2021.  Patient has an OSAH diagnosis, however he is noncompliant with prescribed nocturnal PAP therapy citing the fact that he cannot  tolerate the mask. Functional capacity, as defined by DASI,  is documented as being >/= 4 METS. ECG in the office demonstrated NSR with a first degree AV blocker and RBBB. No changes were made to his medication regimen.  Patient follow-up with outpatient cardiology in 4 months or sooner if needed.  Phillip Nelson has undergone non-invasive stool DNA screening (Cologuard) as a screening for colorectal cancer, which resulted (+). He subsequently underwent colonoscopy for further evaluation.  Patient found to have a mass within the transverse colon that was biopsied.  Pathology resulted as (+) for moderately differentiated adenocarcinoma.  He was referred to general surgery who has recommended definitive treatment via resection.  Patient has been scheduled for a LAPAROSCOPIC RIGHT COLECTOMY on 10/11/2021 with Dr. Caroleen Hamman, MD. Given patient's past medical history significant for cardiovascular diagnoses, presurgical cardiac clearance was sought by the performing surgeon's office and PAT team. Per cardiology, "his EKG is unchanged.  He is able to accomplish about 8 METS of activity. Based ACC/AHA guidelines, the patient's past medical history, and the amount of time since his last clinic visit, this patient would be at an overall ACCEPTABLE risk for the planned procedure without further cardiovascular testing or intervention at this time".  In review of his medication reconciliation, it is noted the patient is on daily antiplatelet therapy. Ideally, given his cardiovascular history, cardiology would prefer him to remain on his low dose ASA. However, with that being said, they understand and appreciate the bleeding potential associated with the planned intervention. If required by surgery, patient has been cleared to hold his daily low-dose ASA for 5-7 days prior to his procedure with plans to restart as soon as postoperative bleeding risk felt to be minimized by his primary attending surgeon. Patient to follow  up with Dr. Dahlia Byes regarding the exact timeframe for holding his ASA.    Patient denies previous perioperative complications with anesthesia in the past. In review of the available records, it is noted that patient underwent a general anesthetic course here at [facility] (ASA III) in 07/2021 without documented complications.      10/05/2021   10:53 AM 09/19/2021    4:08 PM 08/31/2021    1:20 PM  Vitals with BMI  Height _0  _1    Weight 217 lbs 3 oz 218 lbs 13 oz 219 lbs 10 oz  BMI 31.16 84.66 59.93  Systolic 570 177 939  Diastolic 76 77 65  Pulse 77 88 83    Providers/Specialists:   NOTE: Primary physician provider listed below. Patient may have been seen by APP or partner within same practice.   PROVIDER ROLE / SPECIALTY LAST Suszanne Finch, MD General Surgery (Surgeon) 09/19/2021  [name], MD Primary Care Provider 07/12/2021  Glenetta Hew, MD Cardiology 10/05/2021  Earlie Server, MD Medical Oncology 08/31/2021   Allergies:  Indocin [indomethacin] and Penicillins  Current Home Medications:   No current facility-administered medications for this encounter.    allopurinol (ZYLOPRIM) 300 MG tablet   aspirin EC 81 MG tablet   bisacodyl (DULCOLAX) 5 MG EC tablet   Iron-FA-B Cmp-C-Biot-Probiotic (FUSION PLUS) CAPS   metroNIDAZOLE (FLAGYL) 500 MG tablet   neomycin (MYCIFRADIN) 500 MG tablet   nitroGLYCERIN (NITROSTAT) 0.4 MG SL tablet   polyethylene glycol (MIRALAX / GLYCOLAX) 17 g packet   Potassium 99 MG TABS   vitamin B-12 (CYANOCOBALAMIN) 1000 MCG tablet   History:   Past Medical History:  Diagnosis Date   Adenocarcinoma of prostate ([place]) 05/08/2012   a.)  Gleason = 3+3 = 6; PSA = 8.26; volume = 57cc; b.) s/p brachytherapy   Adenocarcinoma of transverse colon ([place]) 08/24/2021   a.) Bx (+) for moderately differentiated adenocarcinoma   Aortic atherosclerosis (HCC)    Aural vertigo    AVM (arteriovenous malformation) of  duodenum, acquired    Basal cell carcinoma 12/31/2006   L mid infraclavicular. Excised 02/27/2007, margins free.   Basal cell carcinoma 10/30/2016    R post shoulder. Nodular pattern.   Basal cell carcinoma 03/31/2020   L lat pretibial - ED&C    Coronary artery disease involving native coronary artery of native heart with other form of angina pectoris ([place]) 09/2003   a.) followed by [facility] Nehemiah Massed); transitioned to [facility] [name]) 06/2021; b.) NSTEMI 09/2003 (Sx= heaviness in chest ~100lb brick, no SOB); LHC --> 95% oLAD with diffuse 50% stenosis -> referred to CVTS; c.) s/p CABG x 1 (LIMA-LAD) 10/19/2003; d.) Non-Ischemic Cardiolite 0/6301   Diastolic dysfunction    a.) TTE 08/04/2014: EF 45%, mild global HK, mild LVH, mild MAC, mild TR/PR; b.) TTE 08/10/2016: EF 50%, mild LVH, mild MAC, mild MR/TR/PR; c.) TTE 07/29/2021: ED 55-60%, G1DD, Ao sclerosis with no stenosis.   Diverticulitis    DOE (dyspnea on exertion)    ED (erectile dysfunction)    Frequent headaches    Gout, arthritis    Hemorrhoids    HTN (hypertension)    Hyperlipidemia    IDA (iron deficiency anemia)    Myalgia due to statin    NSTEMI (non-ST elevated myocardial infarction) ([place]) 10/17/2003   a.) LHC 10/18/2003 --> 95% oLAD with multiple 50% sequential lesions --> referred to CVTS. b.) CABG x 1 (LIMA-LAD) 10/19/2003.   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 surgery.    BILATERAL CARPAL TUNNEL RELEASE Right 06/28/2001   Right (endoscopic)   CARPECTOMY Right    wrist   CATARACT EXTRACTION W/ INTRAOCULAR LENS  IMPLANT, BILATERAL     COLONOSCOPY WITH PROPOFOL N/A 10/31/2016   Procedure: COLONOSCOPY WITH PROPOFOL;  Surgeon: Christene Lye, MD;  Location: [facility] ENDOSCOPY;  Service: Endoscopy;  Laterality: N/A;   COLONOSCOPY WITH PROPOFOL N/A 08/24/2021   Procedure: COLONOSCOPY WITH PROPOFOL;  Surgeon: Lin Landsman, MD;  Location: [facility] ENDOSCOPY;  Service: Gastroenterology;  Laterality: N/A;   CORONARY ARTERY BYPASS GRAFT  10/19/2003   Procedure: CORONARY ARTERY BYPASS GRAFT; Locaation: [place]; Surgeon:  Tobey Grim, IV, MD   DORSAL COMPARTMENT RELEASE Left 06/25/2017   Procedure: LEFT WRIST TENDON SHEATH RELEASE;  Surgeon: Milly Jakob, MD;  Location: [place];  Service: Orthopedics;  Laterality: Left;   EMBOLIZATION N/A 11/21/2018   Procedure: EMBOLIZATION (Colonic);  Surgeon: Katha Cabal, MD;  Location: [place] CV LAB;  Service: Cardiovascular;  Laterality: N/A;   ESOPHAGOGASTRODUODENOSCOPY (EGD) WITH PROPOFOL N/A 08/24/2021   Procedure: ESOPHAGOGASTRODUODENOSCOPY (EGD) WITH PROPOFOL;  Surgeon: Lin Landsman, MD;  Location: [facility] ENDOSCOPY;  Service: Gastroenterology;  Laterality: N/A;   FOOT SURGERY Bilateral    GOUT   KNEE ARTHROSCOPY Bilateral 1986   Right: 1986; left 09/06/2009   LEFT ELBOW SURGERY  AGE 58  &  2007   LEFT HEART CATH AND CORONARY ANGIOGRAPHY  10/18/2003   [facility] (non-STEMI): Ostial LAD 95% with multiple sequential 50% stenoses. => Referred for CABG   NM GATED MYOCARDIAL STUDY (ARMX HX)  07/31/2016   Treadmill Myoview/Cardiolite : [facility]): 4.6 METS, 92 % MPHR.  EF 55 to 60%.  Normal wall motion.  No ischemia or infarction.   OLECRANON BURSA EXCISION Right 06/28/2001   OLECRANON BURSA  EXCISION Left 06/24/2007   RADIOACTIVE SEED IMPLANT N/A 08/02/2012   Procedure: RADIOACTIVE SEED IMPLANT;  Surgeon: Claybon Jabs, MD;  Location: [facility];  Service: Urology;  Laterality: N/A;   TOTAL KNEE ARTHROPLASTY Bilateral 04/14/2004   RIGHT 2006; LEFT 11/21/2010   TRANSTHORACIC ECHOCARDIOGRAM  07/31/2016   ([facility]) normal LV function.  Mild LVH.  EF 50%.  Normal RV.  Mild MR, TR and PR.  No stenosis.   Family History  Problem Relation Age of Onset   Cerebral aneurysm Mother    Alzheimer's disease Father    Lung cancer Sister 88       treated surgically   Social History   Tobacco Use   Smoking status: Former    Packs/day: 3.00    Years: 31.00    Total pack years: 93.00    Types: Cigarettes    Quit date: 08/01/1987    Years since quitting: 34.2   Smokeless tobacco: Never  Vaping Use   Vaping Use: Never used  Substance Use Topics   Alcohol use: Yes    Comment: once a year on NYE   Drug use: No    Pertinent Clinical Results:  LABS: Labs reviewed: Acceptable for surgery.  Lab Results  Component Value Date   WBC 11.2 (H) 08/31/2021   HGB 10.8 (L) 08/31/2021   HCT 36.3 (L) 08/31/2021   MCV 84.4 08/31/2021   PLT 407 (H) 08/31/2021   Lab Results  Component Value Date   NA 136 08/31/2021   K 4.3 08/31/2021   CO2 23 08/31/2021   GLUCOSE 99 08/31/2021   BUN 17 08/31/2021   CREATININE 1.04 08/31/2021   CALCIUM 8.8 (L) 08/31/2021   GFRNONAA >60 08/31/2021   Lab Results  Component Value Date   HGBA1C 6.3 07/12/2021   Component Date Value Ref Range Status   ABO/RH(D) 10/03/2021 O POS   Final   Antibody Screen 10/03/2021 NEG   Final   Sample Expiration 10/03/2021 10/17/2021,2359   Final   Extend sample reason 10/03/2021    Final                   Value:NO TRANSFUSIONS OR PREGNANCY IN THE PAST 3 MONTHS Performed at [facility], [address] [place], [place] [address]     ECG: Date: 10/05/2021 Time ECG obtained:  1058 AM Rate: 77 bpm Rhythm:  Sinus rhythm with first-degree AV block; RBBB Axis (leads I and aVF): Left axis deviation Intervals: PR 228 ms. QRS 126 ms. QTc 454 ms. ST segment and T wave changes: No evidence of acute ST segment elevation or depression Comparison: Similar to previous tracing  obtained on 07/14/2021   IMAGING / PROCEDURES: CT CHEST ABDOMEN PELVIS W CONTRAST performed on 09/09/2021 Short-segment annular wall thickening involving the hepatic flexure of the colon, consistent with known primary colon carcinoma. No evidence of metastatic disease. Colonic diverticulosis. No radiographic evidence of diverticulitis. Aortic atherosclerosis   TRANSTHORACIC ECHOCARDIOGRAM performed on 07/29/2021 Left ventricular ejection fraction, by estimation, is 55 to 60%. The left ventricle has normal function. The left ventricle has no regional wall motion abnormalities. Left ventricular diastolic parameters are consistent with Grade I diastolic dysfunction (impaired relaxation).  Right ventricular systolic function is normal. The right ventricular size is normal. Tricuspid regurgitation signal is inadequate for assessing PA pressure.  The mitral valve is normal in structure. No evidence of mitral valve regurgitation. No evidence of mitral stenosis.  The aortic valve is normal in structure. Aortic valve regurgitation is not visualized. Aortic valve sclerosis/calcification is present, without any evidence of aortic stenosis. Aortic valve area, by VTI measures 2.75 cm.  The inferior vena cava is normal in size with greater than 50% respiratory variability, suggesting right atrial pressure of 3 mmHg  Impression and Plan:  Phillip Nelson has been referred for pre-anesthesia review and clearance prior to him undergoing the planned anesthetic and procedural courses. Available labs, pertinent testing, and imaging results were personally reviewed by me. This patient has been appropriately cleared by  cardiology with an overall ACCEPTABLE risk of significant perioperative cardiovascular complications.  Based on clinical review performed today (10/07/21), barring any significant acute changes in the patient's overall condition, it is anticipated that he will be able to proceed with the planned surgical intervention. Any acute changes in clinical condition may necessitate his procedure being postponed and/or cancelled. Patient will meet with anesthesia team (MD and/or CRNA) on the day of his procedure for preoperative evaluation/assessment. Questions regarding anesthetic course will be fielded at that time.   Pre-surgical instructions were reviewed with the patient during his PAT appointment and questions were fielded by PAT clinical staff. Patient was advised that if any questions or concerns arise prior to his procedure then he should return a call to PAT and/or his surgeon's office to discuss.  Honor Loh, MSN, APRN, FNP-C, CEN [facility]  Peri-operative Services Nurse Practitioner Phone: [phone] Fax: [phone] 10/07/21 8:02 AM  NOTE: This note has been prepared using Dragon dictation software. Despite my best ability to proofread, there is always the potential that unintentional transcriptional errors may still occur from this process.

## 2021-10-07 ENCOUNTER — Encounter: Payer: Self-pay | Admitting: Physician Assistant

## 2021-10-07 ENCOUNTER — Ambulatory Visit: Payer: Medicare Other | Admitting: Family Medicine

## 2021-10-10 NOTE — Addendum Note (Signed)
Addended by: Caroleen Hamman F on: 10/10/2021 02:08 PM   Modules accepted: Orders

## 2021-10-11 ENCOUNTER — Encounter: Payer: Self-pay | Admitting: Surgery

## 2021-10-11 ENCOUNTER — Inpatient Hospital Stay
Admission: RE | Admit: 2021-10-11 | Discharge: 2021-10-21 | DRG: 330 | Disposition: A | Discharge status: Home / Self Care | Payer: Medicare Other | Attending: Surgery | Admitting: Surgery

## 2021-10-11 ENCOUNTER — Other Ambulatory Visit: Payer: Self-pay

## 2021-10-11 ENCOUNTER — Encounter
Admission: RE | Disposition: A | Discharge status: Home / Self Care | Payer: Self-pay | Source: Home / Self Care | Attending: Surgery

## 2021-10-11 ENCOUNTER — Inpatient Hospital Stay: Payer: Medicare Other | Admitting: Urgent Care

## 2021-10-11 DIAGNOSIS — K9189 Other postprocedural complications and disorders of digestive system: Secondary | ICD-10-CM | POA: Diagnosis not present

## 2021-10-11 DIAGNOSIS — K5730 Diverticulosis of large intestine without perforation or abscess without bleeding: Secondary | ICD-10-CM | POA: Diagnosis not present

## 2021-10-11 DIAGNOSIS — E861 Hypovolemia: Secondary | ICD-10-CM | POA: Diagnosis not present

## 2021-10-11 DIAGNOSIS — I452 Bifascicular block: Secondary | ICD-10-CM | POA: Diagnosis present

## 2021-10-11 DIAGNOSIS — N179 Acute kidney failure, unspecified: Secondary | ICD-10-CM | POA: Diagnosis not present

## 2021-10-11 DIAGNOSIS — C183 Malignant neoplasm of hepatic flexure: Secondary | ICD-10-CM | POA: Diagnosis present

## 2021-10-11 DIAGNOSIS — Z79899 Other long term (current) drug therapy: Secondary | ICD-10-CM

## 2021-10-11 DIAGNOSIS — Z8546 Personal history of malignant neoplasm of prostate: Secondary | ICD-10-CM | POA: Diagnosis not present

## 2021-10-11 DIAGNOSIS — N281 Cyst of kidney, acquired: Secondary | ICD-10-CM | POA: Diagnosis not present

## 2021-10-11 DIAGNOSIS — Z961 Presence of intraocular lens: Secondary | ICD-10-CM | POA: Diagnosis present

## 2021-10-11 DIAGNOSIS — E872 Acidosis, unspecified: Secondary | ICD-10-CM | POA: Diagnosis not present

## 2021-10-11 DIAGNOSIS — K567 Ileus, unspecified: Secondary | ICD-10-CM | POA: Diagnosis not present

## 2021-10-11 DIAGNOSIS — J9811 Atelectasis: Secondary | ICD-10-CM | POA: Diagnosis not present

## 2021-10-11 DIAGNOSIS — Z85828 Personal history of other malignant neoplasm of skin: Secondary | ICD-10-CM

## 2021-10-11 DIAGNOSIS — I2510 Atherosclerotic heart disease of native coronary artery without angina pectoris: Secondary | ICD-10-CM | POA: Diagnosis present

## 2021-10-11 DIAGNOSIS — D72829 Elevated white blood cell count, unspecified: Secondary | ICD-10-CM | POA: Diagnosis not present

## 2021-10-11 DIAGNOSIS — Z01810 Encounter for preprocedural cardiovascular examination: Secondary | ICD-10-CM | POA: Diagnosis not present

## 2021-10-11 DIAGNOSIS — G4733 Obstructive sleep apnea (adult) (pediatric): Secondary | ICD-10-CM | POA: Diagnosis present

## 2021-10-11 DIAGNOSIS — I700 Atherosclerosis of aorta: Secondary | ICD-10-CM | POA: Diagnosis present

## 2021-10-11 DIAGNOSIS — Z880 Allergy status to penicillin: Secondary | ICD-10-CM

## 2021-10-11 DIAGNOSIS — D62 Acute posthemorrhagic anemia: Secondary | ICD-10-CM | POA: Diagnosis not present

## 2021-10-11 DIAGNOSIS — C182 Malignant neoplasm of ascending colon: Principal | ICD-10-CM | POA: Diagnosis present

## 2021-10-11 DIAGNOSIS — I110 Hypertensive heart disease with heart failure: Secondary | ICD-10-CM | POA: Diagnosis present

## 2021-10-11 DIAGNOSIS — C189 Malignant neoplasm of colon, unspecified: Secondary | ICD-10-CM | POA: Diagnosis present

## 2021-10-11 DIAGNOSIS — Z820 Family history of epilepsy and other diseases of the nervous system: Secondary | ICD-10-CM

## 2021-10-11 DIAGNOSIS — Z86010 Personal history of colonic polyps: Secondary | ICD-10-CM

## 2021-10-11 DIAGNOSIS — Z9842 Cataract extraction status, left eye: Secondary | ICD-10-CM

## 2021-10-11 DIAGNOSIS — Z951 Presence of aortocoronary bypass graft: Secondary | ICD-10-CM

## 2021-10-11 DIAGNOSIS — Z801 Family history of malignant neoplasm of trachea, bronchus and lung: Secondary | ICD-10-CM

## 2021-10-11 DIAGNOSIS — K9161 Intraoperative hemorrhage and hematoma of a digestive system organ or structure complicating a digestive sytem procedure: Secondary | ICD-10-CM | POA: Diagnosis not present

## 2021-10-11 DIAGNOSIS — E785 Hyperlipidemia, unspecified: Secondary | ICD-10-CM | POA: Diagnosis present

## 2021-10-11 DIAGNOSIS — I252 Old myocardial infarction: Secondary | ICD-10-CM

## 2021-10-11 DIAGNOSIS — Z9049 Acquired absence of other specified parts of digestive tract: Secondary | ICD-10-CM | POA: Diagnosis not present

## 2021-10-11 DIAGNOSIS — Y838 Other surgical procedures as the cause of abnormal reaction of the patient, or of later complication, without mention of misadventure at the time of the procedure: Secondary | ICD-10-CM | POA: Diagnosis not present

## 2021-10-11 DIAGNOSIS — Z9841 Cataract extraction status, right eye: Secondary | ICD-10-CM

## 2021-10-11 DIAGNOSIS — Z96653 Presence of artificial knee joint, bilateral: Secondary | ICD-10-CM | POA: Diagnosis present

## 2021-10-11 DIAGNOSIS — K6389 Other specified diseases of intestine: Secondary | ICD-10-CM | POA: Diagnosis not present

## 2021-10-11 DIAGNOSIS — Z4682 Encounter for fitting and adjustment of non-vascular catheter: Secondary | ICD-10-CM | POA: Diagnosis not present

## 2021-10-11 DIAGNOSIS — Z888 Allergy status to other drugs, medicaments and biological substances status: Secondary | ICD-10-CM

## 2021-10-11 DIAGNOSIS — I9589 Other hypotension: Secondary | ICD-10-CM | POA: Diagnosis not present

## 2021-10-11 DIAGNOSIS — I10 Essential (primary) hypertension: Secondary | ICD-10-CM | POA: Diagnosis not present

## 2021-10-11 DIAGNOSIS — Z7982 Long term (current) use of aspirin: Secondary | ICD-10-CM

## 2021-10-11 DIAGNOSIS — K30 Functional dyspepsia: Secondary | ICD-10-CM | POA: Diagnosis not present

## 2021-10-11 DIAGNOSIS — Z87891 Personal history of nicotine dependence: Secondary | ICD-10-CM

## 2021-10-11 DIAGNOSIS — C772 Secondary and unspecified malignant neoplasm of intra-abdominal lymph nodes: Secondary | ICD-10-CM | POA: Diagnosis present

## 2021-10-11 DIAGNOSIS — Z5331 Laparoscopic surgical procedure converted to open procedure: Secondary | ICD-10-CM

## 2021-10-11 DIAGNOSIS — I5032 Chronic diastolic (congestive) heart failure: Secondary | ICD-10-CM | POA: Diagnosis present

## 2021-10-11 DIAGNOSIS — K4020 Bilateral inguinal hernia, without obstruction or gangrene, not specified as recurrent: Secondary | ICD-10-CM | POA: Diagnosis not present

## 2021-10-11 DIAGNOSIS — R7303 Prediabetes: Secondary | ICD-10-CM | POA: Diagnosis present

## 2021-10-11 DIAGNOSIS — R1084 Generalized abdominal pain: Secondary | ICD-10-CM | POA: Diagnosis not present

## 2021-10-11 HISTORY — DX: Other forms of dyspnea: R06.09

## 2021-10-11 HISTORY — DX: Myalgia, unspecified site: M79.10

## 2021-10-11 HISTORY — DX: Unspecified cataract: H26.9

## 2021-10-11 HISTORY — DX: Angiodysplasia of stomach and duodenum without bleeding: K31.819

## 2021-10-11 HISTORY — DX: Unspecified hemorrhoids: K64.9

## 2021-10-11 HISTORY — DX: Other ill-defined heart diseases: I51.89

## 2021-10-11 HISTORY — DX: Benign neoplasm of colon, unspecified: D12.6

## 2021-10-11 HISTORY — DX: Atherosclerosis of aorta: I70.0

## 2021-10-11 HISTORY — DX: Prediabetes: R73.03

## 2021-10-11 HISTORY — DX: Iron deficiency anemia, unspecified: D50.9

## 2021-10-11 HISTORY — DX: Other forms of angina pectoris: I20.8

## 2021-10-11 HISTORY — DX: Essential (primary) hypertension: I10

## 2021-10-11 HISTORY — DX: Other forms of angina pectoris: I20.89

## 2021-10-11 HISTORY — PX: LAPAROSCOPIC RIGHT COLECTOMY: SHX5925

## 2021-10-11 HISTORY — DX: Bifascicular block: I45.2

## 2021-10-11 HISTORY — DX: Adverse effect of antihyperlipidemic and antiarteriosclerotic drugs, initial encounter: T46.6X5A

## 2021-10-11 LAB — COMPREHENSIVE METABOLIC PANEL
ALT: 22 U/L (ref 0–44)
AST: 35 U/L (ref 15–41)
Albumin: 3.6 g/dL (ref 3.5–5.0)
Alkaline Phosphatase: 44 U/L (ref 38–126)
Anion gap: 13 (ref 5–15)
BUN: 30 mg/dL — ABNORMAL HIGH (ref 8–23)
CO2: 19 mmol/L — ABNORMAL LOW (ref 22–32)
Calcium: 7.9 mg/dL — ABNORMAL LOW (ref 8.9–10.3)
Chloride: 104 mmol/L (ref 98–111)
Creatinine, Ser: 1.86 mg/dL — ABNORMAL HIGH (ref 0.61–1.24)
GFR, Estimated: 35 mL/min — ABNORMAL LOW (ref >60)
Glucose, Bld: 164 mg/dL — ABNORMAL HIGH (ref 70–99)
Potassium: 4.5 mmol/L (ref 3.5–5.1)
Sodium: 136 mmol/L (ref 135–145)
Total Bilirubin: 1.2 mg/dL (ref 0.3–1.2)
Total Protein: 6.1 g/dL — ABNORMAL LOW (ref 6.5–8.1)

## 2021-10-11 LAB — CBC
HCT: 30.5 % — ABNORMAL LOW (ref 39.0–52.0)
Hemoglobin: 9.5 g/dL — ABNORMAL LOW (ref 13.0–17.0)
MCH: 27.8 pg (ref 26.0–34.0)
MCHC: 31.1 g/dL (ref 30.0–36.0)
MCV: 89.2 fL (ref 80.0–100.0)
Platelets: 326 10*3/uL (ref 150–400)
RBC: 3.42 MIL/uL — ABNORMAL LOW (ref 4.22–5.81)
RDW: 20.6 % — ABNORMAL HIGH (ref 11.5–15.5)
WBC: 29.5 10*3/uL — ABNORMAL HIGH (ref 4.0–10.5)
nRBC: 0 % (ref 0.0–0.2)

## 2021-10-11 LAB — CBC WITH DIFFERENTIAL/PLATELET
Abs Immature Granulocytes: 0.12 10*3/uL — ABNORMAL HIGH (ref 0.00–0.07)
Basophils Absolute: 0.1 10*3/uL (ref 0.0–0.1)
Basophils Relative: 0 %
Eosinophils Absolute: 0 10*3/uL (ref 0.0–0.5)
Eosinophils Relative: 0 %
HCT: 35.4 % — ABNORMAL LOW (ref 39.0–52.0)
Hemoglobin: 11.3 g/dL — ABNORMAL LOW (ref 13.0–17.0)
Immature Granulocytes: 1 %
Lymphocytes Relative: 3 %
Lymphs Abs: 0.8 10*3/uL (ref 0.7–4.0)
MCH: 28 pg (ref 26.0–34.0)
MCHC: 31.9 g/dL (ref 30.0–36.0)
MCV: 87.6 fL (ref 80.0–100.0)
Monocytes Absolute: 2.2 10*3/uL — ABNORMAL HIGH (ref 0.1–1.0)
Monocytes Relative: 8 %
Neutro Abs: 22.9 10*3/uL — ABNORMAL HIGH (ref 1.7–7.7)
Neutrophils Relative %: 88 %
Platelets: 295 10*3/uL (ref 150–400)
RBC: 4.04 MIL/uL — ABNORMAL LOW (ref 4.22–5.81)
RDW: 19 % — ABNORMAL HIGH (ref 11.5–15.5)
WBC: 26.1 10*3/uL — ABNORMAL HIGH (ref 4.0–10.5)
nRBC: 0 % (ref 0.0–0.2)

## 2021-10-11 LAB — PROTIME-INR
INR: 1.3 — ABNORMAL HIGH (ref 0.8–1.2)
Prothrombin Time: 16.4 seconds — ABNORMAL HIGH (ref 11.4–15.2)

## 2021-10-11 LAB — LACTIC ACID, PLASMA: Lactic Acid, Venous: 3 mmol/L (ref 0.5–1.9)

## 2021-10-11 LAB — MRSA NEXT GEN BY PCR, NASAL: MRSA by PCR Next Gen: NOT DETECTED

## 2021-10-11 LAB — PREPARE RBC (CROSSMATCH)

## 2021-10-11 LAB — GLUCOSE, CAPILLARY: Glucose-Capillary: 151 mg/dL — ABNORMAL HIGH (ref 70–99)

## 2021-10-11 SURGERY — COLECTOMY, RIGHT, LAPAROSCOPIC
Anesthesia: General

## 2021-10-11 MED ORDER — CELECOXIB 200 MG PO CAPS: 200.0000 mg | ORAL_CAPSULE | ORAL | Status: AC

## 2021-10-11 MED ORDER — DIPHENHYDRAMINE HCL 50 MG/ML IJ SOLN
12.5000 mg | Freq: Four times a day (QID) | INTRAMUSCULAR | Status: DC | PRN
  Administered 2021-10-19: 12.5 mg via INTRAVENOUS
  Filled 2021-10-11: qty 1

## 2021-10-11 MED ORDER — ONDANSETRON HCL 4 MG/2ML IJ SOLN: 4.0000 mg | Freq: Once | INTRAMUSCULAR | Status: DC | PRN

## 2021-10-11 MED ORDER — SUCCINYLCHOLINE CHLORIDE 200 MG/10ML IV SOSY
PREFILLED_SYRINGE | INTRAVENOUS | Status: AC
  Filled 2021-10-11: qty 10

## 2021-10-11 MED ORDER — CHLORHEXIDINE GLUCONATE 0.12 % MT SOLN: 15.0000 mL | Freq: Once | OROMUCOSAL | Status: AC

## 2021-10-11 MED ORDER — DEXMEDETOMIDINE HCL IN NACL 80 MCG/20ML IV SOLN
INTRAVENOUS | Status: AC
  Filled 2021-10-11: qty 20

## 2021-10-11 MED ORDER — ONDANSETRON HCL 4 MG/2ML IJ SOLN
INTRAMUSCULAR | Status: DC | PRN
  Administered 2021-10-11: 4 mg via INTRAVENOUS

## 2021-10-11 MED ORDER — ACETAMINOPHEN 500 MG PO TABS
1000.0000 mg | ORAL_TABLET | Freq: Four times a day (QID) | ORAL | Status: DC
  Administered 2021-10-11 – 2021-10-14 (×13): 1000 mg via ORAL
  Filled 2021-10-11 (×14): qty 2

## 2021-10-11 MED ORDER — SODIUM CHLORIDE 0.9 % IV SOLN
2.0000 g | INTRAVENOUS | Status: AC
  Administered 2021-10-11 (×2): 2 g via INTRAVENOUS

## 2021-10-11 MED ORDER — PANTOPRAZOLE SODIUM 40 MG IV SOLR
40.0000 mg | Freq: Every day | INTRAVENOUS | Status: DC
  Administered 2021-10-11 – 2021-10-18 (×8): 40 mg via INTRAVENOUS
  Filled 2021-10-11 (×8): qty 10

## 2021-10-11 MED ORDER — PHENYLEPHRINE HCL (PRESSORS) 10 MG/ML IV SOLN
INTRAVENOUS | Status: DC | PRN
  Administered 2021-10-11: 160 ug via INTRAVENOUS
  Administered 2021-10-11 (×6): 80 ug via INTRAVENOUS

## 2021-10-11 MED ORDER — SODIUM CHLORIDE (PF) 0.9 % IJ SOLN
INTRAMUSCULAR | Status: AC
  Filled 2021-10-11: qty 50

## 2021-10-11 MED ORDER — SODIUM CHLORIDE 0.9 % IV SOLN
INTRAVENOUS | Status: AC
  Filled 2021-10-11: qty 2

## 2021-10-11 MED ORDER — LIDOCAINE HCL (PF) 2 % IJ SOLN
INTRAMUSCULAR | Status: AC
  Filled 2021-10-11: qty 5

## 2021-10-11 MED ORDER — NOREPINEPHRINE 4 MG/250ML-% IV SOLN
0.0000 ug/min | INTRAVENOUS | Status: DC
  Filled 2021-10-11: qty 250

## 2021-10-11 MED ORDER — ALVIMOPAN 12 MG PO CAPS
ORAL_CAPSULE | ORAL | Status: AC
  Administered 2021-10-11: 12 mg via ORAL
  Filled 2021-10-11: qty 1

## 2021-10-11 MED ORDER — SODIUM CHLORIDE 0.9 % IV SOLN
2.0000 g | Freq: Three times a day (TID) | INTRAVENOUS | Status: AC
  Administered 2021-10-11 – 2021-10-12 (×3): 2 g via INTRAVENOUS
  Filled 2021-10-11 (×3): qty 2

## 2021-10-11 MED ORDER — ACETAMINOPHEN 500 MG PO TABS: 1000.0000 mg | ORAL_TABLET | ORAL | Status: AC

## 2021-10-11 MED ORDER — CHLORHEXIDINE GLUCONATE CLOTH 2 % EX PADS
6.0000 | MEDICATED_PAD | Freq: Once | CUTANEOUS | Status: AC
  Administered 2021-10-11: 6 via TOPICAL

## 2021-10-11 MED ORDER — OXYCODONE HCL 5 MG PO TABS: 5.0000 mg | ORAL_TABLET | Freq: Once | ORAL | Status: DC | PRN

## 2021-10-11 MED ORDER — FAMOTIDINE 20 MG PO TABS: 20.0000 mg | ORAL_TABLET | Freq: Once | ORAL | Status: AC

## 2021-10-11 MED ORDER — GABAPENTIN 300 MG PO CAPS
ORAL_CAPSULE | ORAL | Status: AC
  Administered 2021-10-11: 300 mg via ORAL
  Filled 2021-10-11: qty 1

## 2021-10-11 MED ORDER — SUGAMMADEX SODIUM 200 MG/2ML IV SOLN
INTRAVENOUS | Status: DC | PRN
  Administered 2021-10-11: 200 mg via INTRAVENOUS

## 2021-10-11 MED ORDER — PHENYLEPHRINE HCL-NACL 20-0.9 MG/250ML-% IV SOLN
INTRAVENOUS | Status: DC | PRN
  Administered 2021-10-11: 20 ug/min via INTRAVENOUS

## 2021-10-11 MED ORDER — LACTATED RINGERS IV SOLN: INTRAVENOUS | Status: DC | PRN

## 2021-10-11 MED ORDER — PROPOFOL 10 MG/ML IV BOLUS
INTRAVENOUS | Status: AC
  Filled 2021-10-11: qty 20

## 2021-10-11 MED ORDER — PHENYLEPHRINE HCL (PRESSORS) 10 MG/ML IV SOLN
200.0000 ug | INTRAVENOUS | Status: AC
  Administered 2021-10-11: 200 ug via INTRAVENOUS

## 2021-10-11 MED ORDER — LACTATED RINGERS IV BOLUS
1000.0000 mL | INTRAVENOUS | Status: AC
Start: 2021-10-11 — End: 2021-10-12
  Administered 2021-10-11: 1000 mL via INTRAVENOUS

## 2021-10-11 MED ORDER — ALVIMOPAN 12 MG PO CAPS: 12.0000 mg | ORAL_CAPSULE | ORAL | Status: AC

## 2021-10-11 MED ORDER — METHOCARBAMOL 1000 MG/10ML IJ SOLN: 500.0000 mg | Freq: Three times a day (TID) | INTRAVENOUS | Status: DC | PRN

## 2021-10-11 MED ORDER — ALLOPURINOL 100 MG PO TABS
300.0000 mg | ORAL_TABLET | Freq: Every day | ORAL | Status: DC
  Administered 2021-10-11 – 2021-10-14 (×3): 300 mg via ORAL
  Filled 2021-10-11 (×2): qty 3
  Filled 2021-10-11: qty 1
  Filled 2021-10-11: qty 3

## 2021-10-11 MED ORDER — CHLORHEXIDINE GLUCONATE 0.12 % MT SOLN
OROMUCOSAL | Status: AC
  Administered 2021-10-11: 15 mL via OROMUCOSAL
  Filled 2021-10-11: qty 15

## 2021-10-11 MED ORDER — CELECOXIB 200 MG PO CAPS
ORAL_CAPSULE | ORAL | Status: AC
  Administered 2021-10-11: 200 mg via ORAL
  Filled 2021-10-11: qty 1

## 2021-10-11 MED ORDER — LACTATED RINGERS IV BOLUS
400.0000 mL | INTRAVENOUS | Status: AC
  Administered 2021-10-11: 400 mL via INTRAVENOUS

## 2021-10-11 MED ORDER — STERILE WATER FOR IRRIGATION IR SOLN
Status: DC | PRN
  Administered 2021-10-11: 1000 mL

## 2021-10-11 MED ORDER — ACETAMINOPHEN 10 MG/ML IV SOLN
1000.0000 mg | Freq: Once | INTRAVENOUS | Status: DC | PRN
Start: 2021-10-11 — End: 2021-10-11

## 2021-10-11 MED ORDER — PROCHLORPERAZINE EDISYLATE 10 MG/2ML IJ SOLN
5.0000 mg | Freq: Four times a day (QID) | INTRAMUSCULAR | Status: DC | PRN
  Filled 2021-10-11: qty 2

## 2021-10-11 MED ORDER — OXYCODONE HCL 5 MG PO TABS
5.0000 mg | ORAL_TABLET | ORAL | Status: DC | PRN
  Administered 2021-10-11 – 2021-10-12 (×2): 10 mg via ORAL
  Filled 2021-10-11 (×2): qty 2

## 2021-10-11 MED ORDER — ACETAMINOPHEN 500 MG PO TABS
ORAL_TABLET | ORAL | Status: AC
  Administered 2021-10-11: 1000 mg via ORAL
  Filled 2021-10-11: qty 2

## 2021-10-11 MED ORDER — VASOPRESSIN 20 UNIT/ML IV SOLN
INTRAVENOUS | Status: AC
  Filled 2021-10-11: qty 1

## 2021-10-11 MED ORDER — PROPOFOL 10 MG/ML IV BOLUS
INTRAVENOUS | Status: DC | PRN
  Administered 2021-10-11: 140 mg via INTRAVENOUS

## 2021-10-11 MED ORDER — METHOCARBAMOL 500 MG PO TABS: 500.0000 mg | ORAL_TABLET | Freq: Three times a day (TID) | ORAL | Status: DC | PRN

## 2021-10-11 MED ORDER — FAMOTIDINE 20 MG PO TABS
ORAL_TABLET | ORAL | Status: AC
  Administered 2021-10-11: 20 mg via ORAL
  Filled 2021-10-11: qty 1

## 2021-10-11 MED ORDER — OXYCODONE HCL 5 MG/5ML PO SOLN: 5.0000 mg | Freq: Once | ORAL | Status: DC | PRN

## 2021-10-11 MED ORDER — LIDOCAINE HCL (CARDIAC) PF 100 MG/5ML IV SOSY
PREFILLED_SYRINGE | INTRAVENOUS | Status: DC | PRN
  Administered 2021-10-11: 80 mg via INTRAVENOUS

## 2021-10-11 MED ORDER — SODIUM CHLORIDE (PF) 0.9 % IJ SOLN
INTRAMUSCULAR | Status: DC | PRN
  Administered 2021-10-11: 100 mL

## 2021-10-11 MED ORDER — SODIUM CHLORIDE FLUSH 0.9 % IV SOLN
INTRAVENOUS | Status: AC
  Filled 2021-10-11: qty 10

## 2021-10-11 MED ORDER — FENTANYL CITRATE (PF) 100 MCG/2ML IJ SOLN
INTRAMUSCULAR | Status: AC
  Administered 2021-10-11: 25 ug via INTRAVENOUS
  Filled 2021-10-11: qty 2

## 2021-10-11 MED ORDER — VISTASEAL 10 ML SINGLE DOSE KIT
PACK | CUTANEOUS | Status: AC
  Filled 2021-10-11: qty 10

## 2021-10-11 MED ORDER — DEXMEDETOMIDINE HCL IN NACL 80 MCG/20ML IV SOLN
INTRAVENOUS | Status: AC
  Filled 2021-10-11: qty 40

## 2021-10-11 MED ORDER — ONDANSETRON HCL 4 MG/2ML IJ SOLN
INTRAMUSCULAR | Status: AC
  Filled 2021-10-11: qty 2

## 2021-10-11 MED ORDER — DIPHENHYDRAMINE HCL 12.5 MG/5ML PO ELIX: 12.5000 mg | ORAL_SOLUTION | Freq: Four times a day (QID) | ORAL | Status: DC | PRN

## 2021-10-11 MED ORDER — FENTANYL CITRATE (PF) 100 MCG/2ML IJ SOLN
INTRAMUSCULAR | Status: AC
  Filled 2021-10-11: qty 2

## 2021-10-11 MED ORDER — VASOPRESSIN 20 UNIT/ML IV SOLN
INTRAVENOUS | Status: DC | PRN
  Administered 2021-10-11: 1 [IU] via INTRAVENOUS
  Administered 2021-10-11: 2 [IU] via INTRAVENOUS
  Administered 2021-10-11: 1 [IU] via INTRAVENOUS
  Administered 2021-10-11: 4 [IU] via INTRAVENOUS
  Administered 2021-10-11: 1 [IU] via INTRAVENOUS
  Administered 2021-10-11: 2 [IU] via INTRAVENOUS

## 2021-10-11 MED ORDER — EPHEDRINE SULFATE (PRESSORS) 50 MG/ML IJ SOLN
INTRAMUSCULAR | Status: AC
  Administered 2021-10-11: 10 mg via INTRAVENOUS
  Filled 2021-10-11: qty 1

## 2021-10-11 MED ORDER — EPHEDRINE SULFATE (PRESSORS) 50 MG/ML IJ SOLN
INTRAMUSCULAR | Status: DC | PRN
  Administered 2021-10-11: 5 mg via INTRAVENOUS

## 2021-10-11 MED ORDER — ROCURONIUM BROMIDE 100 MG/10ML IV SOLN
INTRAVENOUS | Status: DC | PRN
  Administered 2021-10-11 (×2): 20 mg via INTRAVENOUS
  Administered 2021-10-11: 30 mg via INTRAVENOUS
  Administered 2021-10-11 (×2): 10 mg via INTRAVENOUS
  Administered 2021-10-11: 50 mg via INTRAVENOUS

## 2021-10-11 MED ORDER — LACTATED RINGERS IV SOLN: INTRAVENOUS | Status: DC

## 2021-10-11 MED ORDER — PHENYLEPHRINE HCL-NACL 20-0.9 MG/250ML-% IV SOLN
0.0000 ug/min | INTRAVENOUS | Status: DC
  Administered 2021-10-11: 30 ug/min via INTRAVENOUS

## 2021-10-11 MED ORDER — PROCHLORPERAZINE MALEATE 10 MG PO TABS: 10.0000 mg | ORAL_TABLET | Freq: Four times a day (QID) | ORAL | Status: DC | PRN

## 2021-10-11 MED ORDER — ORAL CARE MOUTH RINSE: 15.0000 mL | OROMUCOSAL | Status: DC | PRN

## 2021-10-11 MED ORDER — DEXMEDETOMIDINE HCL IN NACL 200 MCG/50ML IV SOLN
INTRAVENOUS | Status: DC | PRN
  Administered 2021-10-11: .2 ug/kg/h via INTRAVENOUS

## 2021-10-11 MED ORDER — ACETAMINOPHEN 10 MG/ML IV SOLN: 1000.0000 mg | INTRAVENOUS | Status: AC

## 2021-10-11 MED ORDER — 0.9 % SODIUM CHLORIDE (POUR BTL) OPTIME
TOPICAL | Status: DC | PRN
  Administered 2021-10-11: 300 mL

## 2021-10-11 MED ORDER — EPHEDRINE SULFATE (PRESSORS) 50 MG/ML IJ SOLN: 10.0000 mg | Freq: Once | INTRAMUSCULAR | Status: AC | PRN

## 2021-10-11 MED ORDER — BUPIVACAINE LIPOSOME 1.3 % IJ SUSP
INTRAMUSCULAR | Status: AC
  Filled 2021-10-11: qty 20

## 2021-10-11 MED ORDER — PHENYLEPHRINE HCL-NACL 20-0.9 MG/250ML-% IV SOLN
INTRAVENOUS | Status: AC
  Filled 2021-10-11: qty 250

## 2021-10-11 MED ORDER — GABAPENTIN 300 MG PO CAPS: 300.0000 mg | ORAL_CAPSULE | ORAL | Status: AC

## 2021-10-11 MED ORDER — FENTANYL CITRATE (PF) 100 MCG/2ML IJ SOLN
INTRAMUSCULAR | Status: DC | PRN
  Administered 2021-10-11: 50 ug via INTRAVENOUS
  Administered 2021-10-11: 100 ug via INTRAVENOUS
  Administered 2021-10-11: 50 ug via INTRAVENOUS

## 2021-10-11 MED ORDER — NITROGLYCERIN 0.4 MG SL SUBL
0.4000 mg | SUBLINGUAL_TABLET | SUBLINGUAL | Status: DC | PRN
Start: 2021-10-11 — End: 2021-10-21

## 2021-10-11 MED ORDER — ROCURONIUM BROMIDE 10 MG/ML (PF) SYRINGE
PREFILLED_SYRINGE | INTRAVENOUS | Status: AC
  Filled 2021-10-11: qty 10

## 2021-10-11 MED ORDER — MORPHINE SULFATE (PF) 2 MG/ML IV SOLN
2.0000 mg | INTRAVENOUS | Status: DC | PRN
  Administered 2021-10-12: 2 mg via INTRAVENOUS
  Filled 2021-10-11 (×2): qty 1

## 2021-10-11 MED ORDER — CHLORHEXIDINE GLUCONATE CLOTH 2 % EX PADS
6.0000 | MEDICATED_PAD | Freq: Every day | CUTANEOUS | Status: DC
Start: 2021-10-12 — End: 2021-10-13
  Administered 2021-10-11: 6 via TOPICAL

## 2021-10-11 MED ORDER — ALBUMIN HUMAN 5 % IV SOLN
INTRAVENOUS | Status: AC
  Filled 2021-10-11: qty 500

## 2021-10-11 MED ORDER — DEXAMETHASONE SODIUM PHOSPHATE 10 MG/ML IJ SOLN
INTRAMUSCULAR | Status: AC
  Filled 2021-10-11: qty 1

## 2021-10-11 MED ORDER — DEXAMETHASONE SODIUM PHOSPHATE 10 MG/ML IJ SOLN
INTRAMUSCULAR | Status: DC | PRN
  Administered 2021-10-11: 5 mg via INTRAVENOUS

## 2021-10-11 MED ORDER — ORAL CARE MOUTH RINSE: 15.0000 mL | Freq: Once | OROMUCOSAL | Status: AC

## 2021-10-11 MED ORDER — SODIUM CHLORIDE 0.9 % IV SOLN: 10.0000 mL/h | Freq: Once | INTRAVENOUS | Status: DC

## 2021-10-11 MED ORDER — BUPIVACAINE-EPINEPHRINE (PF) 0.5% -1:200000 IJ SOLN
INTRAMUSCULAR | Status: AC
  Filled 2021-10-11: qty 30

## 2021-10-11 MED ORDER — ACETAMINOPHEN 10 MG/ML IV SOLN
INTRAVENOUS | Status: AC
  Administered 2021-10-11: 1000 mg via INTRAVENOUS
  Filled 2021-10-11: qty 100

## 2021-10-11 MED ORDER — VISTASEAL 10 ML SINGLE DOSE KIT
PACK | CUTANEOUS | Status: DC | PRN
  Administered 2021-10-11: 10 mL via TOPICAL

## 2021-10-11 MED ORDER — ALBUMIN HUMAN 5 % IV SOLN: INTRAVENOUS | Status: DC | PRN

## 2021-10-11 MED ORDER — KETOROLAC TROMETHAMINE 15 MG/ML IJ SOLN: 15.0000 mg | Freq: Four times a day (QID) | INTRAMUSCULAR | Status: AC | PRN

## 2021-10-11 MED ORDER — HEPARIN SODIUM (PORCINE) 5000 UNIT/ML IJ SOLN
5000.0000 [IU] | Freq: Three times a day (TID) | INTRAMUSCULAR | Status: DC
  Administered 2021-10-12 – 2021-10-21 (×26): 5000 [IU] via SUBCUTANEOUS
  Filled 2021-10-11 (×26): qty 1

## 2021-10-11 MED ORDER — SODIUM CHLORIDE 0.9 % IV SOLN
INTRAVENOUS | Status: DC
  Administered 2021-10-13: 500 mL via INTRAVENOUS

## 2021-10-11 MED ORDER — ALVIMOPAN 12 MG PO CAPS
12.0000 mg | ORAL_CAPSULE | ORAL | Status: AC
  Filled 2021-10-11: qty 1

## 2021-10-11 MED ORDER — FENTANYL CITRATE (PF) 100 MCG/2ML IJ SOLN
25.0000 ug | INTRAMUSCULAR | Status: DC | PRN
  Administered 2021-10-11 (×2): 25 ug via INTRAVENOUS

## 2021-10-11 MED ORDER — ONDANSETRON HCL 4 MG/2ML IJ SOLN
4.0000 mg | Freq: Four times a day (QID) | INTRAMUSCULAR | Status: DC | PRN
  Administered 2021-10-12 – 2021-10-15 (×5): 4 mg via INTRAVENOUS
  Filled 2021-10-11 (×5): qty 2

## 2021-10-11 MED ORDER — ONDANSETRON 4 MG PO TBDP: 4.0000 mg | ORAL_TABLET | Freq: Four times a day (QID) | ORAL | Status: DC | PRN

## 2021-10-11 SURGICAL SUPPLY — 70 items
BARRIER ADH SEPRAFILM 3INX5IN (MISCELLANEOUS) ×2 IMPLANT
BLADE SURG SZ10 CARB STEEL (BLADE) ×2 IMPLANT
CUTTER ECHEON FLEX ENDO 45 340 (ENDOMECHANICALS) ×1 IMPLANT
DERMABOND ADVANCED (GAUZE/BANDAGES/DRESSINGS) ×2
DERMABOND ADVANCED .7 DNX12 (GAUZE/BANDAGES/DRESSINGS) ×2 IMPLANT
DRAPE INCISE IOBAN 66X45 STRL (DRAPES) ×2 IMPLANT
DRSG OPSITE POSTOP 3X4 (GAUZE/BANDAGES/DRESSINGS) ×2 IMPLANT
DRSG OPSITE POSTOP 4X8 (GAUZE/BANDAGES/DRESSINGS) ×1 IMPLANT
DRSG TEGADERM 4X4.75 (GAUZE/BANDAGES/DRESSINGS) ×1 IMPLANT
ELECT BLADE 6.5 EXT (BLADE) ×1 IMPLANT
ELECT CAUTERY BLADE 6.4 (BLADE) ×2 IMPLANT
ELECT REM PT RETURN 9FT ADLT (ELECTROSURGICAL) ×2
ELECTRODE REM PT RTRN 9FT ADLT (ELECTROSURGICAL) ×1 IMPLANT
GLOVE BIO SURGEON STRL SZ7 (GLOVE) ×16 IMPLANT
GOWN STRL REUS W/ TWL LRG LVL3 (GOWN DISPOSABLE) ×4 IMPLANT
GOWN STRL REUS W/TWL LRG LVL3 (GOWN DISPOSABLE) ×12
HANDLE SUCTION POOLE (INSTRUMENTS) ×1 IMPLANT
HANDLE YANKAUER SUCT BULB TIP (MISCELLANEOUS) ×2 IMPLANT
HOLDER FOLEY CATH W/STRAP (MISCELLANEOUS) ×2 IMPLANT
MANIFOLD NEPTUNE II (INSTRUMENTS) ×2 IMPLANT
NEEDLE HYPO 22GX1.5 SAFETY (NEEDLE) ×2 IMPLANT
NS IRRIG 1000ML POUR BTL (IV SOLUTION) ×2 IMPLANT
PACK COLON CLEAN CLOSURE (MISCELLANEOUS) ×2 IMPLANT
PACK LAP CHOLECYSTECTOMY (MISCELLANEOUS) ×2 IMPLANT
PENCIL ELECTRO HAND CTR (MISCELLANEOUS) ×2 IMPLANT
RELOAD PROXIMATE 75MM BLUE (ENDOMECHANICALS) ×6 IMPLANT
RELOAD STAPLE 45 2.6 WHT THIN (STAPLE) IMPLANT
RELOAD STAPLE 60 2.6 WHT THN (STAPLE) ×2 IMPLANT
RELOAD STAPLE 60 3.6 BLU REG (STAPLE) ×2 IMPLANT
RELOAD STAPLE 75 3.8 BLU REG (ENDOMECHANICALS) IMPLANT
RELOAD STAPLER BLUE 60MM (STAPLE) IMPLANT
RELOAD STAPLER WHITE 60MM (STAPLE) IMPLANT
RETRACTOR WOUND ALXS 18CM MED (MISCELLANEOUS) IMPLANT
RTRCTR WOUND ALEXIS O 18CM MED (MISCELLANEOUS) ×2
SHEARS HARMONIC ACE PLUS 36CM (ENDOMECHANICALS) ×3 IMPLANT
SLEEVE ENDOPATH XCEL 5M (ENDOMECHANICALS) IMPLANT
SPIKE FLUID TRANSFER (MISCELLANEOUS) ×1 IMPLANT
SPONGE T-LAP 18X18 ~~LOC~~+RFID (SPONGE) ×9 IMPLANT
SPONGE T-LAP 18X36 ~~LOC~~+RFID STR (SPONGE) ×1 IMPLANT
STAPLE ECHEON FLEX 60 POW ENDO (STAPLE) ×1 IMPLANT
STAPLE RELOAD 45 WHT (STAPLE) ×5 IMPLANT
STAPLE RELOAD 45MM WHITE (STAPLE) ×10
STAPLER GUN LINEAR PROX 60 (STAPLE) ×1 IMPLANT
STAPLER PROXIMATE 75MM BLUE (STAPLE) ×1 IMPLANT
STAPLER RELOAD BLUE 60MM (STAPLE)
STAPLER RELOAD WHITE 60MM (STAPLE)
STAPLER SKIN PROX 35W (STAPLE) ×1 IMPLANT
SUCTION POOLE HANDLE (INSTRUMENTS)
SUT MNCRL 4-0 (SUTURE) ×4
SUT MNCRL 4-0 27XMFL (SUTURE) ×2
SUT PDS AB 0 CT1 27 (SUTURE) ×4 IMPLANT
SUT PROLENE 5 0 RB 1 DA (SUTURE) ×2 IMPLANT
SUT SILK 2 0 (SUTURE) ×2
SUT SILK 2 0 SH CR/8 (SUTURE) ×2 IMPLANT
SUT SILK 2-0 30XBRD TIE 12 (SUTURE) ×1 IMPLANT
SUT VIC AB 3-0 SH 27 (SUTURE) ×2
SUT VIC AB 3-0 SH 27X BRD (SUTURE) ×1 IMPLANT
SUT VICRYL 0 AB UR-6 (SUTURE) ×4 IMPLANT
SUTURE MNCRL 4-0 27XMF (SUTURE) ×2 IMPLANT
SYR 30ML LL (SYRINGE) ×2 IMPLANT
SYR BULB IRRIG 60ML STRL (SYRINGE) ×1 IMPLANT
SYS LAPSCP GELPORT 120MM (MISCELLANEOUS) ×2
SYSTEM LAPSCP GELPORT 120MM (MISCELLANEOUS) ×1 IMPLANT
TOWEL OR 17X26 4PK STRL BLUE (TOWEL DISPOSABLE) ×1 IMPLANT
TRAY FOLEY MTR SLVR 16FR STAT (SET/KITS/TRAYS/PACK) ×2 IMPLANT
TROCAR XCEL 12X100 BLDLESS (ENDOMECHANICALS) ×2 IMPLANT
TROCAR XCEL BLUNT TIP 100MML (ENDOMECHANICALS) ×2 IMPLANT
TROCAR XCEL NON-BLD 5MMX100MML (ENDOMECHANICALS) ×2 IMPLANT
TUBING EVAC SMOKE HEATED PNEUM (TUBING) ×2 IMPLANT
WATER STERILE IRR 500ML POUR (IV SOLUTION) ×3 IMPLANT

## 2021-10-11 NOTE — Anesthesia Preprocedure Evaluation (Signed)
Anesthesia Evaluation  Patient identified by MRN, date of birth, ID band Patient awake    Reviewed: Allergy & Precautions, NPO status , Patient's Chart, lab work & pertinent test results  History of Anesthesia Complications Negative for: history of anesthetic complications  Airway Mallampati: III  TM Distance: >3 FB Neck ROM: full    Dental  (+) Teeth Intact   Pulmonary neg pulmonary ROS, sleep apnea , former smoker,    Pulmonary exam normal        Cardiovascular hypertension, (-) angina+ CAD, + Past MI and + DOE  negative cardio ROS Normal cardiovascular exam     Neuro/Psych negative neurological ROS  negative psych ROS   GI/Hepatic negative GI ROS, Neg liver ROS,   Endo/Other  negative endocrine ROS  Renal/GU      Musculoskeletal   Abdominal   Peds  Hematology negative hematology ROS (+)   Anesthesia Other Findings Past Medical History: 05/08/2012: Adenocarcinoma of prostate ([place])     Comment:  a.) Gleason = 3+3 = 6; PSA = 8.26; volume = 57cc; b.)               s/p brachytherapy 08/24/2021: Adenocarcinoma of transverse colon ([place])     Comment:  a.) Bx (+) for moderately differentiated adenocarcinoma No date: Aortic atherosclerosis (HCC) No date: Aural vertigo No date: AVM (arteriovenous malformation) of duodenum, acquired 12/31/2006: Basal cell carcinoma     Comment:  L mid infraclavicular. Excised 02/27/2007, margins free. 10/30/2016: Basal cell carcinoma     Comment:   R post shoulder. Nodular pattern. 03/31/2020: Basal cell carcinoma     Comment:  L lat pretibial - ED&C  09/2003: Coronary artery disease involving native coronary artery of  native heart with other form of angina pectoris ([place])     Comment:  a.) followed by [facility]); transitioned              to [name]) 06/2021; b.) NSTEMI 09/2003               (Sx= heaviness in chest ~100lb brick, no SOB); LHC -->                95% oLAD with diffuse 50% stenosis -> referred to CVTS;               c.) s/p CABG x 1 (LIMA-LAD) 10/19/2003; d.) Non-Ischemic               Cardiolite 09/5914 No date: Diastolic dysfunction     Comment:  a.) TTE 08/04/2014: EF 45%, mild global HK, mild LVH,               mild MAC, mild TR/PR; b.) TTE 08/10/2016: EF 50%, mild               LVH, mild MAC, mild MR/TR/PR; c.) TTE 07/29/2021: ED               55-60%, G1DD, Ao sclerosis with no stenosis. No date: Diverticulitis No date: DOE (dyspnea on exertion) No date: ED (erectile dysfunction) No date: Frequent headaches No date: Gout, arthritis No date: Hemorrhoids No date: HTN (hypertension) No date: Hyperlipidemia No date: IDA (iron deficiency anemia) No date: Myalgia due to statin 10/17/2003: NSTEMI (non-ST elevated myocardial infarction) ([place])     Comment:  a.) LHC 10/18/2003 --> 95% oLAD with multiple 50%               sequential lesions --> referred to  CVTS. b.) CABG x 1               (LIMA-LAD) 10/19/2003. No date: Nuclear cataract, nonsenile No date: OSA (obstructive sleep apnea)     Comment:  a.) non-compliant with prescibed nocturnal PAP therapy;               unable to tolerate No date: Prediabetes No date: RBBB (right bundle branch block) with left posterior  fascicular block 10/19/2003: S/P CABG x 1     Comment:  a.) s/p CABG x 1 (LIMA-LAD) at [facility] No date: Squamous cell carcinoma, arm, left     Comment:  L forearm prox dorsum  No date: Squamous cell carcinoma, arm, left     Comment:  L forearm prox medial  08/23/2017: Squamous cell carcinoma, arm, right     Comment:  R mid volar forearm No date: Stable angina ([place]) No date: Tubular adenoma of colon  Past Surgical History: 1999: APPENDECTOMY     Comment:  Life was saved with emerg. surgery.  06/28/2001: BILATERAL CARPAL TUNNEL RELEASE; Right     Comment:  Right (endoscopic) No date: CARPECTOMY; Right     Comment:  wrist No date: CATARACT EXTRACTION  W/ INTRAOCULAR LENS  IMPLANT, BILATERAL 10/31/2016: COLONOSCOPY WITH PROPOFOL; N/A     Comment:  Procedure: COLONOSCOPY WITH PROPOFOL;  Surgeon: Christene Lye, MD;  Location: [facility] ENDOSCOPY;  Service:               Endoscopy;  Laterality: N/A; 08/24/2021: COLONOSCOPY WITH PROPOFOL; N/A     Comment:  Procedure: COLONOSCOPY WITH PROPOFOL;  Surgeon: Lin Landsman, MD;  Location: [facility] ENDOSCOPY;  Service:               Gastroenterology;  Laterality: N/A; 10/19/2003: CORONARY ARTERY BYPASS GRAFT     Comment:  Procedure: CORONARY ARTERY BYPASS GRAFT; Locaation:               [place]; Surgeon:  Tobey Grim, IV, MD 06/25/2017: DORSAL COMPARTMENT RELEASE; Left     Comment:  Procedure: LEFT WRIST TENDON SHEATH RELEASE;  Surgeon:               Milly Jakob, MD;  Location: [place];  Service: Orthopedics;  Laterality: Left; 11/21/2018: EMBOLIZATION; N/A     Comment:  Procedure: EMBOLIZATION ([place]);  Surgeon: Katha Cabal, MD;  Location: [place] [facility];  Service:              Cardiovascular;  Laterality: N/A; 08/24/2021: ESOPHAGOGASTRODUODENOSCOPY (EGD) WITH PROPOFOL; N/A     Comment:  Procedure: ESOPHAGOGASTRODUODENOSCOPY (EGD) WITH               PROPOFOL;  Surgeon: Lin Landsman, MD;  Location:               [facility] ENDOSCOPY;  Service: Gastroenterology;  Laterality:               N/A; No date: FOOT SURGERY; Bilateral     Comment:  GOUT 1986: KNEE ARTHROSCOPY; Bilateral     Comment:  Right: 1986; left 09/06/2009 AGE 21  &  2007: LEFT ELBOW  SURGERY 10/18/2003: LEFT HEART CATH AND CORONARY ANGIOGRAPHY     Comment:  [place] (non-STEMI): [place] LAD 95% with multiple               sequential 50% stenoses. => Referred for CABG 07/31/2016: NM GATED MYOCARDIAL STUDY (ARMX HX)     Comment:  Treadmill Myoview/Cardiolite : [facility]):               4.6 METS, 92 % MPHR.  EF 55 to  60%.  Normal wall motion.               No ischemia or infarction. 06/28/2001: OLECRANON BURSA EXCISION; Right 06/24/2007: OLECRANON BURSA EXCISION; Left 08/02/2012: RADIOACTIVE SEED IMPLANT; N/A     Comment:  Procedure: RADIOACTIVE SEED IMPLANT;  Surgeon: Claybon Jabs, MD;  Location: [place];                Service: Urology;  Laterality: N/A; 04/14/2004: TOTAL KNEE ARTHROPLASTY; Bilateral     Comment:  RIGHT 2006; LEFT 11/21/2010 07/31/2016: TRANSTHORACIC ECHOCARDIOGRAM     Comment:  ([facility]) normal LV function.  Mild LVH.                EF 50%.  Normal RV.  Mild MR, TR and PR.  No stenosis.  BMI    Body Mass Index: 28.70 kg/m      Reproductive/Obstetrics negative OB ROS                            Anesthesia Physical Anesthesia Plan  ASA: 3  Anesthesia Plan: General/Spinal   Post-op Pain Management:    Induction: Intravenous  PONV Risk Score and Plan: 2 and Ondansetron, Treatment may vary due to age or medical condition and Dexamethasone  Airway Management Planned: Oral ETT  Additional Equipment:   Intra-op Plan:   Post-operative Plan: Extubation in OR  Informed Consent: I have reviewed the patients History and Physical, chart, labs and discussed the procedure including the risks, benefits and alternatives for the proposed anesthesia with the patient or authorized representative who has indicated his/her understanding and acceptance.     Dental Advisory Given  Plan Discussed with: Anesthesiologist, CRNA and Surgeon  Anesthesia Plan Comments: (Patient consented for risks of anesthesia including but not limited to:  - adverse reactions to medications - damage to eyes, teeth, lips or other oral mucosa - nerve damage due to positioning  - sore throat or hoarseness - Damage to heart, brain, nerves, lungs, other parts of body or loss of life  Patient voiced understanding.)         Anesthesia Quick Evaluation

## 2021-10-11 NOTE — Transfer of Care (Signed)
Immediate Anesthesia Transfer of Care Note  Patient: ISAC LINCKS  Procedure(s) Performed: LAPAROSCOPIC RIGHT COLECTOMY-RNFA to assist Converted to open procedure  Patient Location: PACU  Anesthesia Type:General  Level of Consciousness: drowsy  Airway & Oxygen Therapy: Patient Spontanous Breathing and Patient connected to face mask oxygen  Post-op Assessment: Report given to RN and Post -op Vital signs reviewed and stable  Post vital signs: Reviewed and stable  Last Vitals:  Vitals Value Taken Time  BP 90/57 10/11/21 1519  Temp 36.4 C 10/11/21 1519  Pulse 89 10/11/21 1523  Resp 24 10/11/21 1523  SpO2 99 % 10/11/21 1523  Vitals shown include unvalidated device data.  Last Pain:  Vitals:   10/11/21 0751  TempSrc: Oral         Complications: No notable events documented.

## 2021-10-11 NOTE — Anesthesia Procedure Notes (Signed)
Procedure Name: Intubation Date/Time: 10/11/2021 10:40 AM  Performed by: Daryel Gerald, RNPre-anesthesia Checklist: Patient identified, Emergency Drugs available, Suction available and Patient being monitored Patient Re-evaluated:Patient Re-evaluated prior to induction Oxygen Delivery Method: Circle system utilized Preoxygenation: Pre-oxygenation with 100% oxygen Induction Type: IV induction Ventilation: Mask ventilation without difficulty Laryngoscope Size: Miller and 2 Grade View: Grade I Tube type: Oral Number of attempts: 1 Airway Equipment and Method: Stylet and Oral airway Placement Confirmation: ETT inserted through vocal cords under direct vision, positive ETCO2 and breath sounds checked- equal and bilateral Secured at: 22 cm Tube secured with: Tape Dental Injury: Teeth and Oropharynx as per pre-operative assessment

## 2021-10-11 NOTE — Consult Note (Signed)
Name: Phillip Nelson MRN: [id number] DOB: [birth year]    ADMISSION DATE:  10/11/2021 CONSULTATION DATE: 10/11/2021  REFERRING MD : Dr. Dahlia Byes  CHIEF COMPLAINT: Hypotension   BRIEF PATIENT DESCRIPTION: 87 yo male admitted to ICU with hypotension following hand assisted laparoscopic converted to open right hemicolectomy with stapled ileocolostomy   SIGNIFICANT EVENTS  07/18: Admitted to ICU with hypotension following hand assisted laparoscopic converted to open right hemicolectomy with stapled ileocolostomy   STUDIES:  None  HISTORY OF PRESENT ILLNESS:   This is an 87 yo male with a PMH of Prediabetes, Iron Deficiency Anemia, Basal Carcinoma, CABG, Chronic Diastolic CHF (TTE 49/44/9675: EF 45%), Adenocarcinoma of Prostate and Transverse Colon, Erectile Dysfunction, HTN, RBBB, OSA (non-compliant with CPAP), and Stable Angina.  He has a hx of right colon cancer and presented to [facility] on [facility]/18 underwent hand assisted laparoscopic converted to open right hemicolectomy with stapled ileocolostomy.  During surgical procedure findings were right colon mass with no evidence of distant metastasis; tension free anastomosis; and there was bleeding from the mesenteric vessel requiring conversion to open procedure for appropriate vascular control.  Postop pt hypotensive requiring vasopressor therapy briefly.  PCCM consulted to assist with management.    PAST MEDICAL HISTORY :   has a past medical history of Adenocarcinoma of prostate ([place]) (05/08/2012), Adenocarcinoma of transverse colon ([place]) (08/24/2021), Aortic atherosclerosis ([place]), Aural vertigo, AVM (arteriovenous malformation) of duodenum, acquired, Basal cell carcinoma (12/31/2006), Basal cell carcinoma (10/30/2016), Basal cell carcinoma (03/31/2020), Coronary artery disease involving native coronary artery of native heart with other form of angina pectoris ([place]) (91/6384), Diastolic dysfunction, Diverticulitis, DOE (dyspnea on exertion), ED  (erectile dysfunction), Frequent headaches, Gout, arthritis, Hemorrhoids, HTN (hypertension), Hyperlipidemia, IDA (iron deficiency anemia), Myalgia due to statin, NSTEMI (non-ST elevated myocardial infarction) ([place]) (10/17/2003), Nuclear cataract, nonsenile, OSA (obstructive sleep apnea), Prediabetes, RBBB (right bundle branch block) with left posterior fascicular block, S/P CABG x 1 (10/19/2003), Squamous cell carcinoma, arm, left, Squamous cell carcinoma, arm, left, Squamous cell carcinoma, arm, right (08/23/2017), Stable angina ([place]), and Tubular adenoma of colon.  has a past surgical history that includes Carpectomy (Right); Knee arthroscopy (Bilateral, 1986); Cataract extraction w/ intraocular lens  implant, bilateral; Foot surgery (Bilateral); Total knee arthroplasty (Bilateral, 04/14/2004); LEFT ELBOW SURGERY (AGE 45  &  2007); transthoracic echocardiogram (07/31/2016); Coronary artery bypass graft (10/19/2003); Radioactive seed implant (N/A, 08/02/2012); Appendectomy (1999); Colonoscopy with propofol (N/A, 10/31/2016); Dorsal compartment release (Left, 06/25/2017); EMBOLIZATION (N/A, 11/21/2018); NM GATED MYOCARDIAL STUDY ([place] HX) (07/31/2016); LEFT HEART CATH AND CORONARY ANGIOGRAPHY (10/18/2003); Bilateral carpal tunnel release (Right, 06/28/2001); Olecranon bursa excision (Right, 06/28/2001); Olecranon bursa excision (Left, 06/24/2007); Colonoscopy with propofol (N/A, 08/24/2021); and Esophagogastroduodenoscopy (egd) with propofol (N/A, 08/24/2021). Prior to Admission medications   Medication Sig Start Date End Date Taking? Authorizing Provider  bisacodyl (DULCOLAX) 5 MG EC tablet Take 5 mg by mouth once. Before surgery   Yes [provider]  Iron-FA-B Cmp-C-Biot-Probiotic (FUSION PLUS) CAPS Take 1 capsule by mouth daily. 08/16/21  Yes Vanga, Tally Due, MD  metroNIDAZOLE (FLAGYL) 500 MG tablet Take 2 tablets at 8 AM, take 2 tablets at 2 PM, and take 2 tablets at 8 PM the day prior to  your surgery. 09/19/21  Yes Pabon, Diego F, MD  neomycin (MYCIFRADIN) 500 MG tablet Take 2 tablets (1,000 mg total) by mouth 3 (three) times daily. 09/19/21  Yes Pabon, Diego F, MD  polyethylene glycol (MIRALAX / GLYCOLAX) 17 g packet Take 17 g by mouth daily.  Yes [provider]  Potassium 99 MG TABS Take 297 mg by mouth daily.    Yes [provider]  vitamin B-12 (CYANOCOBALAMIN) 1000 MCG tablet Take 1,000 mcg by mouth daily.   Yes [provider]  allopurinol (ZYLOPRIM) 300 MG tablet Take 1 tablet (300 mg total) by mouth daily. 07/12/21   [name], MD  aspirin EC 81 MG tablet Take 81 mg by mouth as needed.    [provider]  nitroGLYCERIN (NITROSTAT) 0.4 MG SL tablet Place 1 tablet (0.4 mg total) under the tongue every 5 (five) minutes as needed for chest pain. 07/12/21   [name], MD   Allergies  Allergen Reactions   Indocin [Indomethacin] Other (See Comments)    Shakes. tremors   Penicillins Itching, Rash and Other (See Comments)    Did it involve swelling of the face/tongue/throat, SOB, or low BP? Unknown Did it involve sudden or severe rash/hives, skin peeling, or any reaction on the inside of your mouth or nose? Unknown Did you need to seek medical attention at a hospital or doctor's office? Unknown When did it last happen? unknown If all above answers are "NO", may proceed with cephalosporin use.     FAMILY HISTORY:  family history includes Alzheimer's disease in his father; Cerebral aneurysm in his mother; Lung cancer (age of onset: 67) in his sister. SOCIAL HISTORY:  reports that he quit smoking about 34 years ago. His smoking use included cigarettes. He has a 93.00 pack-year smoking history. He has never used smokeless tobacco. He reports current alcohol use. He reports that he does not use drugs.  REVIEW OF SYSTEMS: Positives in BOLD  Constitutional: Negative for fever, chills, weight loss, malaise/fatigue and diaphoresis.   HENT: Negative for hearing loss, ear pain, nosebleeds, congestion, sore throat, neck pain, tinnitus and ear discharge.   Eyes: Negative for blurred vision, double vision, photophobia, pain, discharge and redness.  Respiratory: Negative for cough, hemoptysis, sputum production, shortness of breath, wheezing and stridor.   Cardiovascular: Negative for chest pain, palpitations, orthopnea, claudication, leg swelling and PND.  Gastrointestinal: heartburn, nausea, vomiting, abdominal pain, diarrhea, constipation, blood in stool and melena.  Genitourinary: Negative for dysuria, urgency, frequency, hematuria and flank pain.  Musculoskeletal: Negative for myalgias, back pain, joint pain and falls.  Skin: Negative for itching and rash.  Neurological: Negative for dizziness, tingling, tremors, sensory change, speech change, focal weakness, seizures, loss of consciousness, weakness and headaches.  Endo/Heme/Allergies: Negative for environmental allergies and polydipsia. Does not bruise/bleed easily.  SUBJECTIVE:  C/o abdominal pain worse when taking a deep breath   VITAL SIGNS: Temp:  [96.8 F (36 C)-97.6 F (36.4 C)] 97.6 F (36.4 C) (07/18 1755) Pulse Rate:  [79-101] 96 (07/18 1900) Resp:  [10-23] 15 (07/18 1900) BP: (63-135)/(45-74) 112/66 (07/18 1900) SpO2:  [94 %-100 %] 97 % (07/18 1900) Weight:  [90.7 kg-96.7 kg] 96.7 kg (07/18 1755)  PHYSICAL EXAMINATION: General: Well developed, well nourished male resting in bed NAD Neuro: Alert and oriented, follows commands  HEENT: Supple, no JVD  Cardiovascular: NSR, rrr, no r/g, 2+ radial/1+ distal pulses, no edema  Lungs: Clear throughout, even, non labored  Abdomen: Very faint BS x4, soft, tender, non distended, 3 abdominal incisions presented with honeycomb dressing in place incision sites well approximated Musculoskeletal: Normal bulk and tone, moves all extremities  Skin: 3 abdominal incisions presented with honeycomb dressing in place  incision sites well approximated  Recent Labs  Lab 10/11/21 1729  NA  136  K 4.5  CL 104  CO2 19*  BUN 30*  CREATININE 1.86*  GLUCOSE 164*   Recent Labs  Lab 10/11/21 1545 10/11/21 1729  HGB 9.5* 11.3*  HCT 30.5* 35.4*  WBC 29.5* 26.1*  PLT 326 295   No results found.  ASSESSMENT / PLAN:  Right colon cancer s/p open right hemicolectomy with stapled ileocolostomy  Hypotension secondary to intraoperative acute blood loss anemia  - Serial H&H q8hrs for now - Monitor for s/sx of bleeding and transfuse for hgb <7 - Aggressive iv fluid resuscitation and prn levophed gtt to maintain map >65 - Trend WBC and monitor fever curve  - Continue surgical prophylaxis cefotan   Acute kidney injury secondary to ATN in the setting of hypotension  Lactic acidosis  - Trend BMP and lactic acid  - Replace electrolytes as indicated  - Monitor UOP - Avoid nephrotoxic medications   Postop pain  - Prn toradol, morphine, and oxycodone for pain management   Donell Beers, [place] Pager [phone] (please enter 7 digits) PCCM Consult Pager [phone] (please enter 7 digits)

## 2021-10-11 NOTE — Interval H&P Note (Signed)
History and Physical Interval Note:  10/11/2021 7:48 AM  Phillip Nelson  has presented today for surgery, with the diagnosis of Colon Cancer.  The various methods of treatment have been discussed with the patient and family. After consideration of risks, benefits and other options for treatment, the patient has consented to  Procedure(s) with comments: LAPAROSCOPIC RIGHT COLECTOMY-RNFA to assist (N/A) - Provider is requesting 3 hours(180 minutes) for this case as a surgical intervention.  The patient's history has been reviewed, patient examined, no change in status, stable for surgery.  I have reviewed the patient's chart and labs.  Questions were answered to the patient's satisfaction.     [place]

## 2021-10-11 NOTE — Addendum Note (Signed)
Addendum  created 10/11/21 1731 by Darrin Nipper, MD   Order list changed, Pharmacy for encounter modified

## 2021-10-11 NOTE — Anesthesia Postprocedure Evaluation (Signed)
Anesthesia Post Note  Patient: Phillip Nelson  Procedure(s) Performed: LAPAROSCOPIC RIGHT COLECTOMY-RNFA to assist Converted to open procedure  Patient location during evaluation: PACU Anesthesia Type: General Level of consciousness: awake and alert, oriented and patient cooperative Pain management: pain level controlled Vital Signs Assessment: post-procedure vital signs reviewed and stable Respiratory status: spontaneous breathing, nonlabored ventilation and respiratory function stable Cardiovascular status: blood pressure returned to baseline and stable Postop Assessment: adequate PO intake Anesthetic complications: no   No notable events documented.   Last Vitals:  Vitals:   10/11/21 1620 10/11/21 1630  BP: (!) 94/55 105/61  Pulse: 82 86  Resp: 10 10  Temp: (!) 36.1 C (!) 36.3 C  SpO2: 94% 97%    Last Pain:  Vitals:   10/11/21 1624  TempSrc:   PainSc: Asleep                 Darrin Nipper

## 2021-10-11 NOTE — Op Note (Signed)
PROCEDURES: 1. Hand assisted Laparoscopic converted to open Right Hemicolectomy With stapled ileocolostomy  Pre-operative Diagnosis: Right Colon CA  Post-operative Diagnosis: Same  Surgeon: [place] RNFA  Required due to the complexity of the case: for exposure and creation of the anastomosis  Anesthesia: General endotracheal anesthesia  ASA Class: 2  Surgeon: Caroleen Hamman , MD FACS  Anesthesia: Gen. with endotracheal tube   Findings: Right colon mass, no evidence of distant metastasis Tension free anastomosis, no evidence of intraop leak and good perfusion There was bleeding from the mesenteric vessel requiring conversion to open procedure for appropriate vascular control  Estimated Blood Loss: 800cc         Drains: none         Specimens: Right colon          Complications: none          Procedure Details  The patient was seen again in the Holding Room. The benefits, complications, treatment options, and expected outcomes were discussed with the patient. The risks of bleeding, infection, recurrence of symptoms, failure to resolve symptoms,  bowel injury, any of which could require further surgery were reviewed with the patient.   The patient was taken to Operating Room, identified as Phillip Nelson and the procedure verified.  A Time Out was held and the above information confirmed.  Prior to the induction of general anesthesia, antibiotic prophylaxis was administered. VTE prophylaxis was in place. General endotracheal anesthesia was then administered and tolerated well. After the induction, the abdomen was prepped with Chloraprep and draped in the sterile fashion. The patient was positioned in the supine position.  7 cm incision was created as a midline mini laparotomy. The abdominal cavity was entered under direct visualization and the GelPort device was placed. two 5 mm ports were placed  under direct visualization and pneumoperitoneum was  obtained.  The more dynamic changes were observed. The greater omentum was divided and the hepatic flexure was taken down using harmonic scalpel.  The white line of Toldt was incised and a lateral to medial dissection was performed.  We identified the right ureter as well as the duodenum and preserve both structures at all times. Were also able to mobilize the attachments of the cecum and terminal ileum.  Once we had an adequate mobilization were able to remove the GelPort and exteriorized the right colon.  A 10 cm margin on the terminal ileum was identified and we created a window with electrocautery and divided the terminal ileum.  Attention then was turned to the distal excision margin.  We identified the middle colic artery on selected a spot right to the middle colic artery.  We Were able to also use a 75 GIA stapler to divide this area.  The mesentery was scored with electrocautery.  We identified the right colic artery and suture ligated with 2 oh silks in the standard fashion.   The rest of the mesentery was divided using the harmonic scalpel.  Please note that we went as low as possible to the base of the mesentery to obtain adequate lymph nodes and adequate margins of dissection.  When excising the specimen and ligating the vessels one of the mesenteric suture ligatures fell off and there was significant bleeding  I attempted to control it but was limited given my incision. I went ahead and quickly converted to an open procedure. I needed to close the vessel w 5-0 prolene since it was retracted and it  lack a good stump. Vistaseal was applied. Excellent hemostasis achieved . I resected and additional piece of terminal ileum to make sure we had great perfusion at the TI.I also resected the specimen in two stages to allow good mobilization while obtaining vascular control.  Specimen was passed and sent to permanent pathology.  A standard side-to-side functional end to end staple anastomosis was created  with multiple loads of a 75 GIA stapler device.  We check for patency as well as leak.  There was a tension-free anastomosis with good perfusion and no evidence of intraoperative leak.    We changed gloves and place a clean closure tray.   liposomal Marcaine was injected throughout the abdominal wall on both sides under direct visualization and palpation.  The fascia was closed with a running 0 PDS using the small bite techniques.  Incisions were closed staples.  Needle and laparotomy counts were correct and there were no immediate complications     Caroleen Hamman, MD, FACS

## 2021-10-12 ENCOUNTER — Encounter: Payer: Self-pay | Admitting: Surgery

## 2021-10-12 LAB — TYPE AND SCREEN
ABO/RH(D): O POS
Antibody Screen: NEGATIVE
Unit division: 0

## 2021-10-12 LAB — BPAM RBC
Blood Product Expiration Date: 202308142359
ISSUE DATE / TIME: 202307181539
Unit Type and Rh: 5100

## 2021-10-12 LAB — CBC
HCT: 32 % — ABNORMAL LOW (ref 39.0–52.0)
Hemoglobin: 10.5 g/dL — ABNORMAL LOW (ref 13.0–17.0)
MCH: 28.2 pg (ref 26.0–34.0)
MCHC: 32.8 g/dL (ref 30.0–36.0)
MCV: 85.8 fL (ref 80.0–100.0)
Platelets: 245 10*3/uL (ref 150–400)
RBC: 3.73 MIL/uL — ABNORMAL LOW (ref 4.22–5.81)
RDW: 19.5 % — ABNORMAL HIGH (ref 11.5–15.5)
WBC: 17.1 10*3/uL — ABNORMAL HIGH (ref 4.0–10.5)
nRBC: 0 % (ref 0.0–0.2)

## 2021-10-12 LAB — HEMOGLOBIN AND HEMATOCRIT, BLOOD
HCT: 32.3 % — ABNORMAL LOW (ref 39.0–52.0)
Hemoglobin: 10.3 g/dL — ABNORMAL LOW (ref 13.0–17.0)

## 2021-10-12 LAB — BASIC METABOLIC PANEL
Anion gap: 8 (ref 5–15)
BUN: 25 mg/dL — ABNORMAL HIGH (ref 8–23)
CO2: 22 mmol/L (ref 22–32)
Calcium: 7.8 mg/dL — ABNORMAL LOW (ref 8.9–10.3)
Chloride: 105 mmol/L (ref 98–111)
Creatinine, Ser: 1.51 mg/dL — ABNORMAL HIGH (ref 0.61–1.24)
GFR, Estimated: 45 mL/min — ABNORMAL LOW (ref >60)
Glucose, Bld: 130 mg/dL — ABNORMAL HIGH (ref 70–99)
Potassium: 4.4 mmol/L (ref 3.5–5.1)
Sodium: 135 mmol/L (ref 135–145)

## 2021-10-12 LAB — MAGNESIUM: Magnesium: 1.9 mg/dL (ref 1.7–2.4)

## 2021-10-12 LAB — LACTIC ACID, PLASMA: Lactic Acid, Venous: 2.1 mmol/L (ref 0.5–1.9)

## 2021-10-12 LAB — PHOSPHORUS: Phosphorus: 4 mg/dL (ref 2.5–4.6)

## 2021-10-12 MED ORDER — BOOST / RESOURCE BREEZE PO LIQD CUSTOM
1.0000 | Freq: Three times a day (TID) | ORAL | Status: DC
  Administered 2021-10-12 – 2021-10-14 (×5): 1 via ORAL

## 2021-10-12 MED ORDER — ADULT MULTIVITAMIN W/MINERALS CH
1.0000 | ORAL_TABLET | Freq: Every day | ORAL | Status: DC
  Administered 2021-10-13: 1 via ORAL
  Filled 2021-10-12: qty 1

## 2021-10-12 NOTE — TOC Initial Note (Signed)
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 We will continue to monitor patient advancement through interdisciplinary progression rounds. If new patient transition needs arise, please place a TOC consult.          Patient Goals and CMS Choice        Expected Discharge Plan and Services                                                Prior Living Arrangements/Services                       Activities of Daily Living Home Assistive Devices/Equipment: Eyeglasses, Dentures (specify type) ADL Screening (condition at time of admission) Patient's cognitive ability adequate to safely complete daily activities?: Yes Is the patient deaf or have difficulty hearing?: No Does the patient have difficulty seeing, even when wearing glasses/contacts?: No Does the patient have difficulty concentrating, remembering, or making decisions?: No Patient able to express need for assistance with ADLs?: Yes Does the patient have difficulty dressing or bathing?: No Independently performs ADLs?: Yes (appropriate for developmental age) Does the patient have difficulty walking or climbing stairs?: No Weakness of Legs: None Weakness of  Arms/Hands: None  Permission Sought/Granted                  Emotional Assessment              Admission diagnosis:  Colon cancer [facility]) [C18.9] Patient Active Problem List   Diagnosis Date Noted   Colon cancer ([place]) 10/11/2021   Hypotension due to hypovolemia    Colon adenocarcinoma ([place]) 08/31/2021   Goals of care, counseling/discussion 08/31/2021   AVM (arteriovenous malformation) of duodenum, acquired    Mass of colon    Nuclear cataract, nonsenile 08/16/2021   Peptic ulcer 08/16/2021   DOE (dyspnea on exertion) 07/12/2021   Bilateral carotid artery stenosis 11/18/2020   Gout 09/22/2019   Daily headache 09/22/2019   Anemia 09/22/2019   GI bleed 11/20/2018   Prediabetes 10/31/2017   Myalgia due to statin 10/29/2017   Stable angina pectoris ([place]) 06/22/2016   Colon polyps 03/02/2015   History of lower GI bleeding 03/02/2015   Hyperlipidemia with target LDL less than 70 07/20/2014   OSA (obstructive sleep apnea) 07/20/2014   History of prostate cancer 05/08/2012   Coronary artery disease involving native coronary artery of native heart without angina pectoris 09/2003   History of heart attack 09/2003   PCP:  [name], MD Pharmacy:   [facility] [address], [place] [address] Phone: [phone] Fax: [place], [place] [address] [address] Phone: [phone] Fax: [place], [place] [address] Phone: [phone] Fax: [phone]     Social Determinants of Health (SDOH) Interventions    Readmission Risk Interventions     No data to display

## 2021-10-12 NOTE — Progress Notes (Signed)
Initial Nutrition Assessment  DOCUMENTATION CODES:   Not applicable  INTERVENTION:   Boost Breeze po TID, each supplement provides 250 kcal and 9 grams of protein  MVI po daily   Pt at high refeed risk; recommend monitor potassium, magnesium and phosphorus labs daily until stable  NUTRITION DIAGNOSIS:   Increased nutrient needs related to post-op healing as evidenced by estimated needs.  GOAL:   Patient will meet greater than or equal to 90% of their needs  MONITOR:   PO intake, Supplement acceptance, Diet advancement, Labs, Weight trends, Skin, I & O's  REASON FOR ASSESSMENT:   Malnutrition Screening Tool    ASSESSMENT:   87 y/o male with h/o BCC, prostate cancer, HLD, OSA, gout, CAD s/p CABG, NSTEMI, HTN, PUD, IDA and diverticulitis who is admitted with colon mass s/p laparoscopic converted to open right hemicolectomy with stapled ileocolostomy 7/18.  Met with pt in room today. Pt reports good appetite and oral intake at baseline. Pt reports that he has not eaten much of anything over the past 4 days. Pt reports that he did not eat any of his breakfast today but reports that he has drank ~8 cups of water. Pt reports that he does not feel like eating r/t abdominal pain. Pt reports that the pain is all over his whole abdomen but was relieved slightly after pain medications. Pt reports mild nausea. Pt denies any flatus or BM yet. RD discussed with pt the importance of adequate nutrition needed to support post op healing. Encouraged pt to sip on liquids throughout the day to keep his gut stimulated. Also discussed as diet advances to focus on getting good protein to support his healing. RD will add supplements and MVI to help pt meet his estimated needs. Pt is at refeed risk. Per chart, pt is down 8lbs(3%) over the past 3 months; this is not significant.   Medications reviewed and include: allopurinol, heparin, protonix, NaCl _0 /hr, cefotan  Labs reviewed: K 4.5 wnl, BUN  25(H), creat 1.51(H), P 4.0 wnl, Mg 1.9 wnl Wbc- 17.1(H), Hgb 10.5(L), Hct 32.0(L)  NUTRITION - FOCUSED PHYSICAL EXAM:  Flowsheet Row Most Recent Value  Orbital Region No depletion  Upper Arm Region No depletion  Thoracic and Lumbar Region No depletion  Buccal Region No depletion  Temple Region No depletion  Clavicle Bone Region Mild depletion  Clavicle and Acromion Bone Region Mild depletion  Scapular Bone Region No depletion  Dorsal Hand Mild depletion  Patellar Region Mild depletion  Anterior Thigh Region Mild depletion  Posterior Calf Region Mild depletion  Edema (RD Assessment) None  Hair Reviewed  Eyes Reviewed  Mouth Reviewed  Skin Reviewed  Nails Reviewed   Diet Order:   Diet Order             Diet clear liquid Room service appropriate? Yes  Diet effective now                  EDUCATION NEEDS:   Education needs have been addressed  Skin:  Skin Assessment: Reviewed RN Assessment (incision abdomen)  Last BM:  pta  Height:   Ht Readings from Last 1 Encounters:  10/11/21 _1  (1.778 m)    Weight:   Wt Readings from Last 1 Encounters:  10/11/21 96.7 kg    Ideal Body Weight:  75.45 kg  BMI:  Body mass index is 30.59 kg/m.  Estimated Nutritional Needs:   Kcal:  2000-2300kcal/day  Protein:  100-115g/day  Fluid:  1.9-2.2L/day  Myriam Jacobson  Jazlynn Nemetz MS, RD, LDN Please refer to AMION for RD and/or RD on-call/weekend/after hours pager  

## 2021-10-12 NOTE — Progress Notes (Signed)
Report called to [facility] on 1C. Patient to move to room 101. All belongings sent with patient. Patient was able to stand and transfer from bed to chair in the morning. Then stand at bedside around 14:00 to use the urinal with no difficulties and walk to new bed and sit and swing his feet into the bed himself.

## 2021-10-12 NOTE — Plan of Care (Signed)
Vital signs reviewed, ICU needs resolved  Will sign off at this time. No further recommendations at this time.  Please call [phone] for further questions. Thank you.    Oshae Simmering David Seynabou Fults, M.D.  [facility]  Medical Director ICU-[facility] [place] Medical Director [facility] Cardio-Pulmonary Department   

## 2021-10-12 NOTE — Progress Notes (Signed)
CC: POD # 1 Subjective: Hypotension in PACU, started on pressors but wean off by the time he arrived in ICU. Since then he is doing very well w good hemodynamics. Hb stable. No major complaints this am Mild AKI, good UO Lactate improving  Objective: Vital signs in last 24 hours: Temp:  [96.8 F (36 C)-98.4 F (36.9 C)] 98.4 F (36.9 C) (07/19 0830) Pulse Rate:  [76-96] 91 (07/19 0900) Resp:  [10-23] 19 (07/19 0900) BP: (63-136)/(45-104) 136/104 (07/19 0900) SpO2:  [91 %-100 %] 95 % (07/19 0900) Weight:  [96.7 kg] 96.7 kg (07/18 1755) Last BM Date :  (PTA)  Intake/Output from previous day: 07/18 0701 - 07/19 0700 In: 3541.1 [I.V.:2524.1; Blood:395; IV Piggyback:622] Out: 2015 [Urine:1215; Blood:800] Intake/Output this shift: Total I/O In: 226.3 [I.V.:148.4; IV Piggyback:77.9] Out: -   Physical exam: NAD alert, in good spirits Abd: soft, incisions c/d/I, no infection or bleeding   Lab Results: CBC  Recent Labs    10/11/21 1729 10/11/21 2359 10/12/21 0406  WBC 26.1*  --  17.1*  HGB 11.3* 10.3* 10.5*  HCT 35.4* 32.3* 32.0*  PLT 295  --  245   BMET Recent Labs    10/11/21 1729 10/12/21 0609  NA 136 135  K 4.5 4.4  CL 104 105  CO2 19* 22  GLUCOSE 164* 130*  BUN 30* 25*  CREATININE 1.86* 1.51*  CALCIUM 7.9* 7.8*   PT/INR Recent Labs    10/11/21 1729  LABPROT 16.4*  INR 1.3*   ABG No results for input(s): "PHART", "HCO3" in the last 72 hours.  Invalid input(s): "PCO2", "PO2"  Studies/Results: No results found.  Anti-infectives: Anti-infectives (From admission, onward)    Start     Dose/Rate Route Frequency Ordered Stop   10/11/21 1645  cefoTEtan (CEFOTAN) 2 g in sodium chloride 0.9 % 100 mL IVPB        2 g 200 mL/hr over 30 Minutes Intravenous Every 8 hours 10/11/21 1633 10/12/21 2259   10/11/21 1443  sodium chloride 0.9 % with cefoTEtan (CEFOTAN) ADS Med       Note to Pharmacy: Jordan Hawks H: cabinet override      10/11/21 1443  10/12/21 0259   10/11/21 0800  cefoTEtan (CEFOTAN) 2 g in sodium chloride 0.9 % 100 mL IVPB        2 g 200 mL/hr over 30 Minutes Intravenous On call to O.R. 10/11/21 4854 10/12/21 0709   10/11/21 0756  sodium chloride 0.9 % with cefoTEtan (CEFOTAN) ADS Med       Note to Pharmacy: Jordan Hawks H: cabinet override      10/11/21 0756 10/11/21 2332       Assessment/Plan:  Doing well Resolved HD issues Xfer to floor Cld Ambulate Dc foley   Caroleen Hamman, MD, FACS  10/12/2021

## 2021-10-13 LAB — BASIC METABOLIC PANEL
Anion gap: 2 — ABNORMAL LOW (ref 5–15)
BUN: 15 mg/dL (ref 8–23)
CO2: 23 mmol/L (ref 22–32)
Calcium: 7.6 mg/dL — ABNORMAL LOW (ref 8.9–10.3)
Chloride: 111 mmol/L (ref 98–111)
Creatinine, Ser: 1.07 mg/dL (ref 0.61–1.24)
GFR, Estimated: >60 mL/min (ref >60)
Glucose, Bld: 87 mg/dL (ref 70–99)
Potassium: 3.5 mmol/L (ref 3.5–5.1)
Sodium: 136 mmol/L (ref 135–145)

## 2021-10-13 LAB — PHOSPHORUS: Phosphorus: 1.6 mg/dL — ABNORMAL LOW (ref 2.5–4.6)

## 2021-10-13 LAB — MAGNESIUM: Magnesium: 2.2 mg/dL (ref 1.7–2.4)

## 2021-10-13 MED ORDER — POTASSIUM & SODIUM PHOSPHATES 280-160-250 MG PO PACK
1.0000 | PACK | Freq: Three times a day (TID) | ORAL | Status: DC
  Administered 2021-10-13 – 2021-10-17 (×12): 1 via ORAL
  Filled 2021-10-13 (×27): qty 1

## 2021-10-13 NOTE — Progress Notes (Signed)
Mobility Specialist - Progress Note    10/13/21 1000  Mobility  Activity Ambulated independently in hallway;Stood at bedside  Level of Assistance Modified independent, requires aide device or extra time  Assistive Device Front wheel walker  Distance Ambulated (ft) 160 ft  Activity Response Tolerated well  $Mobility charge 1 Mobility    Pt sitting in recliner upon arrival using RA. Pt completes STS indep and ambulated ModI 1 lap around NS. Denies pain and voices no complaints throughout. Pt returns to recliner with needs in reach.  Merrily Brittle Mobility Specialist 10/13/21, 11:02 AM

## 2021-10-13 NOTE — Progress Notes (Signed)
POD # 2 Doing very well Avss Creat normalized Good UO Minimal pain Ambulated Tolerating fulls Had small BM yesterday  PE NAD Abd: soft, appropiate incisional tenderness, incisions c/d/I  A/P Doing well  Replace phos Ambulate Keep fulls DC 24-48 hrs Path pend

## 2021-10-13 NOTE — Progress Notes (Signed)
   10/12/21 2125  Mobility  HOB Elevated/Bed Position HOB 30  Activity Ambulated with assistance to bathroom  Range of Motion/Exercises Active;All extremities  Level of Assistance Modified independent, requires aide device or extra time  Assistive Device Front wheel walker  Distance Ambulated (ft) 20 ft  Activity Response Tolerated well  Transport method Wheelchair

## 2021-10-14 LAB — CBC
HCT: 29.1 % — ABNORMAL LOW (ref 39.0–52.0)
Hemoglobin: 9.3 g/dL — ABNORMAL LOW (ref 13.0–17.0)
MCH: 27.9 pg (ref 26.0–34.0)
MCHC: 32 g/dL (ref 30.0–36.0)
MCV: 87.4 fL (ref 80.0–100.0)
Platelets: 285 10*3/uL (ref 150–400)
RBC: 3.33 MIL/uL — ABNORMAL LOW (ref 4.22–5.81)
RDW: 20.6 % — ABNORMAL HIGH (ref 11.5–15.5)
WBC: 14.3 10*3/uL — ABNORMAL HIGH (ref 4.0–10.5)
nRBC: 0 % (ref 0.0–0.2)

## 2021-10-14 LAB — COMPREHENSIVE METABOLIC PANEL
ALT: 25 U/L (ref 0–44)
AST: 28 U/L (ref 15–41)
Albumin: 3.1 g/dL — ABNORMAL LOW (ref 3.5–5.0)
Alkaline Phosphatase: 42 U/L (ref 38–126)
Anion gap: 5 (ref 5–15)
BUN: 10 mg/dL (ref 8–23)
CO2: 25 mmol/L (ref 22–32)
Calcium: 8.6 mg/dL — ABNORMAL LOW (ref 8.9–10.3)
Chloride: 110 mmol/L (ref 98–111)
Creatinine, Ser: 0.85 mg/dL (ref 0.61–1.24)
GFR, Estimated: >60 mL/min (ref >60)
Glucose, Bld: 101 mg/dL — ABNORMAL HIGH (ref 70–99)
Potassium: 3.5 mmol/L (ref 3.5–5.1)
Sodium: 140 mmol/L (ref 135–145)
Total Bilirubin: 1 mg/dL (ref 0.3–1.2)
Total Protein: 6 g/dL — ABNORMAL LOW (ref 6.5–8.1)

## 2021-10-14 LAB — PHOSPHORUS: Phosphorus: 2 mg/dL — ABNORMAL LOW (ref 2.5–4.6)

## 2021-10-14 LAB — MAGNESIUM: Magnesium: 2.3 mg/dL (ref 1.7–2.4)

## 2021-10-14 MED ORDER — POTASSIUM PHOSPHATES 15 MMOLE/5ML IV SOLN
15.0000 mmol | Freq: Once | INTRAVENOUS | Status: AC
  Administered 2021-10-14: 15 mmol via INTRAVENOUS
  Filled 2021-10-14: qty 5

## 2021-10-14 MED ORDER — FUROSEMIDE 10 MG/ML IJ SOLN
20.0000 mg | Freq: Once | INTRAMUSCULAR | Status: AC
  Administered 2021-10-14: 20 mg via INTRAVENOUS
  Filled 2021-10-14: qty 2

## 2021-10-14 NOTE — Care Management Important Message (Signed)
Important Message  Patient Details  Name: Phillip Nelson MRN: [id number] Date of Birth: 02-12-1935   Medicare Important Message Given:  N/A - LOS <3 / Initial given by admissions     Juliann Pulse A Maryellen Dowdle 10/14/2021, 7:55 AM

## 2021-10-14 NOTE — TOC Initial Note (Signed)
Transition of Care [facility]) - Initial/Assessment Note    Patient Details  Name: Phillip Nelson MRN: [id number] Date of Birth: 1934-05-13  [facility] CM/SW Contact:    Conception Oms, RN Phone Number: 10/14/2021, 10:12 AM  Clinical Narrative:                 Spoke to the patient, He has transportation with his step son or his friend when he discharges, he has a rolling walker at home if needed, no additional needs        Patient Goals and CMS Choice        Expected Discharge Plan and Services                                                Prior Living Arrangements/Services                       Activities of Daily Living Home Assistive Devices/Equipment: Eyeglasses, Dentures (specify type) ADL Screening (condition at time of admission) Patient's cognitive ability adequate to safely complete daily activities?: Yes Is the patient deaf or have difficulty hearing?: No Does the patient have difficulty seeing, even when wearing glasses/contacts?: No Does the patient have difficulty concentrating, remembering, or making decisions?: No Patient able to express need for assistance with ADLs?: Yes Does the patient have difficulty dressing or bathing?: No Independently performs ADLs?: Yes (appropriate for developmental age) Does the patient have difficulty walking or climbing stairs?: No Weakness of Legs: None Weakness of Arms/Hands: None  Permission Sought/Granted                  Emotional Assessment              Admission diagnosis:  Colon cancer [facility]) [C18.9] Patient Active Problem List   Diagnosis Date Noted   Colon cancer ([place]) 10/11/2021   Hypotension due to hypovolemia    Colon adenocarcinoma ([place]) 08/31/2021   Goals of care, counseling/discussion 08/31/2021   AVM (arteriovenous malformation) of duodenum, acquired    Mass of colon    Nuclear cataract, nonsenile 08/16/2021   Peptic ulcer 08/16/2021   DOE (dyspnea  on exertion) 07/12/2021   Bilateral carotid artery stenosis 11/18/2020   Gout 09/22/2019   Daily headache 09/22/2019   Anemia 09/22/2019   GI bleed 11/20/2018   Prediabetes 10/31/2017   Myalgia due to statin 10/29/2017   Stable angina pectoris ([place]) 06/22/2016   Colon polyps 03/02/2015   History of lower GI bleeding 03/02/2015   Hyperlipidemia with target LDL less than 70 07/20/2014   OSA (obstructive sleep apnea) 07/20/2014   History of prostate cancer 05/08/2012   Coronary artery disease involving native coronary artery of native heart without angina pectoris 09/2003   History of heart attack 09/2003   PCP:  [name], MD Pharmacy:   [facility] [address], [place] [address] Phone: [phone] Fax: [place], [place] [address] [place] [address] Phone: [phone] Fax: [place] [id number], [place] [address] Phone: [phone] Fax: [phone]     Social Determinants of  Health (SDOH) Interventions    Readmission Risk Interventions     No data to display

## 2021-10-14 NOTE — Progress Notes (Signed)
patient seen and examined earlier today. Walking ancillary diet although he has decreased appetite. Creatinine normalize. Good hearing output. I don't know. Exam is benign but that's have some distention. Patient wanted to go home today but I would like to observe him one more day to make sure that his Dewain Penning is resolving, and to make sure that we are able to correct his electrolytes. He shows understanding

## 2021-10-14 NOTE — Discharge Instructions (Addendum)
Open Colectomy, Care After This sheet gives you information about how to care for yourself after your procedure. Your health care provider may also give you more specific instructions. If you have problems or questions, contact your health care provider. What can I expect after the procedure? After the procedure, it is common to have: Pain in your abdomen, especially along your incision. Tiredness. Your energy level will return to normal over the next several weeks. Constipation. Nausea. Difficulty urinating. Follow these instructions at home: Medicines Take over-the-counter and prescription medicines, including stool softeners, only as told by your health care provider. Ask your health care provider if the medicine prescribed to you requires you to avoid driving or using machinery. Eating and drinking Follow instructions from your health care provider about eating or drinking restrictions. Eat a low-fiber diet for the first 4 weeks after surgery. Most people on a low-fiber eating plan should eat less than 10 grams (g) of fiber a day. Follow recommendations from your health care provider or dietitian about how much fiber you should have each day. Always check food labels to know the fiber content of packaged foods. In general, a low-fiber food will have fewer than 2 g of fiber a serving. In general, try to avoid whole grains, raw fruits and vegetables, dried fruit, tough cuts of meat, nuts, and seeds. Incision care  Follow instructions from your health care provider about how to take care of your incision. Make sure you: Wash your hands with soap and water for at least 20 seconds before and after you change your bandage (dressing). If soap and water are not available, use hand sanitizer. Change your dressing as told by your health care provider. Leave stitches (sutures), staples, or adhesive strips in place. These skin closures may need to stay in place for 2 weeks or longer. If adhesive  strip edges start to loosen and curl up, you may trim the loose edges. Do not remove adhesive strips completely unless your health care provider tells you to do that. Avoid wearing tight clothing around your incision. Protect your incision area from the sun. Do not take baths, swim, or use a hot tub until your health care provider approves. Ask your health care provider if you may take showers. You may only be allowed to take sponge baths. Check your incision area every day for signs of infection. Check for: More redness, swelling, or pain. Fluid or blood. Warmth. Pus or a bad smell. Managing constipation Your procedure may cause constipation. To prevent or treat constipation, you may need to: Drink enough fluid to keep your urine pale yellow. Take over-the-counter or prescription medicines. Activity  Rest as told by your health care provider. Avoid sitting for a long time without moving. Get up to take short walks every 1-2 hours. This is important to improve blood flow and breathing. Ask for help if you feel weak or unsteady. Avoid activities that require a lot of energy for 4-6 weeks after surgery, such as running, climbing, and lifting heavy objects. You may need to continue to do breathing exercises. Do not lift anything that is heavier than 10 lb (4.5 kg), or the limit that you are told, until your health care provider says that it is safe. Do not drive until you are no longer taking prescription pain medicines and you feel you are able to drive safely. This might take up to 4 weeks. Return to your normal activities as told by your health care provider. Ask your health care  provider what activities are safe for you. General instructions If you have an opening (stoma) in your abdomen, follow instructions from your health care provider about how to care for the stoma and the external pouch attached to it (ostomy pouch). Do not use any products that contain nicotine or tobacco, such as  cigarettes, e-cigarettes, and chewing tobacco. These can delay incision healing after surgery. If you need help quitting, ask your health care provider. Keep all follow-up visits as told by your health care provider. This is important. Contact a health care provider if: You have any of these signs of infection: More redness, swelling, or pain around your incision. Fluid or blood coming from your incision. Warmth coming from your incision. Pus or a bad smell coming from your incision. A fever or chills. You do not have a bowel movement within 2-3 days after surgery. You cannot eat or drink for 24 hours or longer. You have nausea and vomiting that will not go away. You have pain in your abdomen that gets worse and does not get better with medicine. Get help right away if: You have chest pain. You have shortness of breath. You have pain or swelling in your legs. Your incision breaks open after your sutures, staples, or adhesive strips have been removed. You have bleeding from the rectum. These symptoms may represent a serious problem that is an emergency. Do not wait to see if the symptoms will go away. Get medical help right away. Call your local emergency services (911 in the [place] Do not drive yourself to the hospital. Summary After your procedure, it is common to have pain, tiredness, constipation, nausea, and difficulty urinating. If you have an opening (stoma) in your abdomen, follow instructions from your health care provider about how to care for the stoma and the external pouch attached to it (ostomy pouch). Follow instructions from your health care provider about eating and drinking and about how to take care of your incision. Keep all follow-up visits as told by your health care provider. This is important. This information is not intended to replace advice given to you by your health care provider. Make sure you discuss any questions you have with your health care  provider. Document Revised: 02/19/2019 Document Reviewed: 02/19/2019 Elsevier Patient Education  [place].

## 2021-10-15 ENCOUNTER — Inpatient Hospital Stay: Payer: Medicare Other

## 2021-10-15 LAB — BASIC METABOLIC PANEL
Anion gap: 10 (ref 5–15)
BUN: 12 mg/dL (ref 8–23)
CO2: 24 mmol/L (ref 22–32)
Calcium: 8.5 mg/dL — ABNORMAL LOW (ref 8.9–10.3)
Chloride: 101 mmol/L (ref 98–111)
Creatinine, Ser: 0.81 mg/dL (ref 0.61–1.24)
GFR, Estimated: >60 mL/min (ref >60)
Glucose, Bld: 112 mg/dL — ABNORMAL HIGH (ref 70–99)
Potassium: 3.7 mmol/L (ref 3.5–5.1)
Sodium: 135 mmol/L (ref 135–145)

## 2021-10-15 LAB — PHOSPHORUS: Phosphorus: 2.5 mg/dL (ref 2.5–4.6)

## 2021-10-15 LAB — MAGNESIUM: Magnesium: 2.1 mg/dL (ref 1.7–2.4)

## 2021-10-15 MED ORDER — DEXTROSE IN LACTATED RINGERS 5 % IV SOLN: INTRAVENOUS | Status: AC

## 2021-10-15 NOTE — Progress Notes (Signed)
Postoperative day #4 Had some emesis this morning. Labs okay AVSS Path d/w pt in detail KUB showing ileus  PE NAD Abd: soft, mildly distended with decreased bowel sounds.  No peritonitis.  Incision healing well without infection  A/P postoperative ileus. May need NG. Back to IV fluids. Recheck labs and x-ray in the morning

## 2021-10-16 ENCOUNTER — Inpatient Hospital Stay: Payer: Medicare Other

## 2021-10-16 LAB — BASIC METABOLIC PANEL
Anion gap: 5 (ref 5–15)
BUN: 13 mg/dL (ref 8–23)
CO2: 29 mmol/L (ref 22–32)
Calcium: 8.2 mg/dL — ABNORMAL LOW (ref 8.9–10.3)
Chloride: 101 mmol/L (ref 98–111)
Creatinine, Ser: 0.89 mg/dL (ref 0.61–1.24)
GFR, Estimated: >60 mL/min (ref >60)
Glucose, Bld: 111 mg/dL — ABNORMAL HIGH (ref 70–99)
Potassium: 3.6 mmol/L (ref 3.5–5.1)
Sodium: 135 mmol/L (ref 135–145)

## 2021-10-16 LAB — CBC
HCT: 28.4 % — ABNORMAL LOW (ref 39.0–52.0)
Hemoglobin: 9.2 g/dL — ABNORMAL LOW (ref 13.0–17.0)
MCH: 28.2 pg (ref 26.0–34.0)
MCHC: 32.4 g/dL (ref 30.0–36.0)
MCV: 87.1 fL (ref 80.0–100.0)
Platelets: 342 10*3/uL (ref 150–400)
RBC: 3.26 MIL/uL — ABNORMAL LOW (ref 4.22–5.81)
RDW: 20.2 % — ABNORMAL HIGH (ref 11.5–15.5)
WBC: 13.6 10*3/uL — ABNORMAL HIGH (ref 4.0–10.5)
nRBC: 0.1 % (ref 0.0–0.2)

## 2021-10-16 LAB — MAGNESIUM: Magnesium: 2.2 mg/dL (ref 1.7–2.4)

## 2021-10-16 LAB — PHOSPHORUS: Phosphorus: 3 mg/dL (ref 2.5–4.6)

## 2021-10-16 NOTE — Progress Notes (Signed)
Postoperative day #5 NGT 1400cc Labs okay AVSS NO PAIN KUB showing pers ileus   PE NAD Abd: soft, mildly distended with decreased bowel sounds.  No peritonitis.  Incision healing well without infection   A/P postoperative ileus. Keep NGT If not resolve by tomorrow may need PICC TPN Recheck labs and x-ray in the morning No clinical evidence of anastomotic leak

## 2021-10-17 ENCOUNTER — Inpatient Hospital Stay: Payer: Self-pay

## 2021-10-17 ENCOUNTER — Inpatient Hospital Stay: Payer: Medicare Other

## 2021-10-17 LAB — BASIC METABOLIC PANEL
Anion gap: 7 (ref 5–15)
Anion gap: 7 (ref 5–15)
BUN: 11 mg/dL (ref 8–23)
BUN: 10 mg/dL (ref 8–23)
CO2: 27 mmol/L (ref 22–32)
CO2: 27 mmol/L (ref 22–32)
Calcium: 8.4 mg/dL — ABNORMAL LOW (ref 8.9–10.3)
Calcium: 8.3 mg/dL — ABNORMAL LOW (ref 8.9–10.3)
Chloride: 105 mmol/L (ref 98–111)
Chloride: 100 mmol/L (ref 98–111)
Creatinine, Ser: 0.9 mg/dL (ref 0.61–1.24)
Creatinine, Ser: 0.82 mg/dL (ref 0.61–1.24)
GFR, Estimated: >60 mL/min (ref >60)
GFR, Estimated: >60 mL/min (ref >60)
Glucose, Bld: 90 mg/dL (ref 70–99)
Glucose, Bld: 91 mg/dL (ref 70–99)
Potassium: 3.6 mmol/L (ref 3.5–5.1)
Potassium: 3.8 mmol/L (ref 3.5–5.1)
Sodium: 139 mmol/L (ref 135–145)
Sodium: 134 mmol/L — ABNORMAL LOW (ref 135–145)

## 2021-10-17 LAB — GLUCOSE, CAPILLARY: Glucose-Capillary: 80 mg/dL (ref 70–99)

## 2021-10-17 LAB — MAGNESIUM: Magnesium: 2.3 mg/dL (ref 1.7–2.4)

## 2021-10-17 LAB — PHOSPHORUS: Phosphorus: 2.6 mg/dL (ref 2.5–4.6)

## 2021-10-17 MED ORDER — INSULIN ASPART 100 UNIT/ML IJ SOLN
0.0000 [IU] | Freq: Four times a day (QID) | INTRAMUSCULAR | Status: DC
  Administered 2021-10-18 – 2021-10-20 (×7): 2 [IU] via SUBCUTANEOUS
  Filled 2021-10-17 (×7): qty 1

## 2021-10-17 MED ORDER — SODIUM CHLORIDE 0.9% FLUSH: 10.0000 mL | INTRAVENOUS | Status: DC | PRN

## 2021-10-17 MED ORDER — LACTATED RINGERS IV SOLN: INTRAVENOUS | Status: DC

## 2021-10-17 MED ORDER — SODIUM CHLORIDE 0.9% FLUSH
10.0000 mL | Freq: Two times a day (BID) | INTRAVENOUS | Status: DC
  Administered 2021-10-17 – 2021-10-19 (×3): 10 mL
  Administered 2021-10-19: 20 mL
  Administered 2021-10-20 – 2021-10-21 (×3): 10 mL

## 2021-10-17 MED ORDER — CHLORHEXIDINE GLUCONATE CLOTH 2 % EX PADS
6.0000 | MEDICATED_PAD | Freq: Every day | CUTANEOUS | Status: DC
Start: 2021-10-18 — End: 2021-10-21
  Administered 2021-10-18 – 2021-10-21 (×4): 6 via TOPICAL

## 2021-10-17 MED ORDER — TRAVASOL 10 % IV SOLN
INTRAVENOUS | Status: AC
  Filled 2021-10-17: qty 544.3

## 2021-10-17 NOTE — Progress Notes (Signed)
Nutrition Follow Up Note   DOCUMENTATION CODES:   Not applicable  INTERVENTION:   TPN per pharmacy  Recommend thiamine 100mg daily added to TPN x 3 days   Pt at high refeed risk; recommend monitor potassium, magnesium and phosphorus labs daily until stable  Daily weights  NUTRITION DIAGNOSIS:   Increased nutrient needs related to post-op healing as evidenced by estimated needs.  GOAL:   Patient will meet greater than or equal to 90% of their needs -not met   MONITOR:   Diet advancement, Labs, Weight trends, Skin, I & O's, Other (Comment) (TPN)  ASSESSMENT:   87 y/o male with h/o BCC, prostate cancer, HLD, OSA, gout, CAD s/p CABG, NSTEMI, HTN, PUD, IDA and diverticulitis who is admitted with colon mass s/p laparoscopic converted to open right hemicolectomy with stapled ileocolostomy 7/18.  Pt advanced to full liquid diet on 7/19. Pt documented to be eating 50-100% of meals on full liquid diet but then developed vomiting and abdominal distension on 7/22. KUB from 7/22 reporting ileus. Pt downgraded to clear liquid diet at that time. Pt reports that he is feeling better today; pt reports passing flatus and having BM. Abdomen remains distended. NGT in place to LIS with 500ml output.  Pt is ambulating. Plan today is for PICC line and TPN. Pt is likely at low refeed risk as he has been on IV dextrose. No new weight since admission; will order daily weights.   Medications reviewed and include: allopurinol, heparin, protonix, Kphos, 5% dextrose @75ml/hr   Labs reviewed: K 3.6 wnl, P 2.6 wnl, Mg 2.3 wnl Wbc- 13.6(H), Hgb 9.2(L), Hct 28.4(L)  Diet Order:   Diet Order     None      EDUCATION NEEDS:   Education needs have been addressed  Skin:  Skin Assessment: Reviewed RN Assessment (incision abdomen)  Last BM:  7/24- TYPE 3  Height:   Ht Readings from Last 1 Encounters:  10/11/21 5' 10" (1.778 m)    Weight:   Wt Readings from Last 1 Encounters:  10/11/21 96.7 kg     Ideal Body Weight:  75.45 kg  BMI:  Body mass index is 30.59 kg/m.  Estimated Nutritional Needs:   Kcal:  2000-2300kcal/day  Protein:  100-115g/day  Fluid:  1.9-2.2L/day  Casey Campbell MS, RD, LDN Please refer to [facility] for RD and/or RD on-call/weekend/after hours pager 

## 2021-10-17 NOTE — Progress Notes (Signed)
[facility] Hospital Day(s): 6.   Post op day(s): 6 Days Post-Op.   Interval History:  Patient seen and examined No acute events or new complaints overnight.  Patient reports he is feeling better; he is frustrated and wants to go home No abdominal pain; he remains distended No fever, chills, nausea, emesis Labs this morning are reassuring; renal function normal; no electrolyte derangements NGT output recorded at 500 ccs; has 400 ccs in canister; he is unsure when these were changed He has had an episode of flatus and BM this morning Ambulating well; did 2 laps   Vital signs in last 24 hours: [min-max] current  Temp:  [97.9 F (36.6 C)-99 F (37.2 C)] 99 F (37.2 C) (07/24 1147) Pulse Rate:  [89-98] 91 (07/24 1147) Resp:  [16-18] 16 (07/24 1147) BP: (122-132)/(61-66) 122/62 (07/24 1147) SpO2:  [95 %-97 %] 96 % (07/24 1147)     Height: '5\' 10"'$  (177.8 cm) Weight: 96.7 kg BMI (Calculated): 30.59   Intake/Output last 2 shifts:  07/23 0701 - 07/24 0700 In: -  Out: 850 [Urine:350; Emesis/NG output:500]   Physical Exam:  Constitutional: alert, cooperative and no distress  HEENT: NGT in place; output bilious Respiratory: breathing non-labored at rest  Cardiovascular: regular rate and sinus rhythm  Gastrointestinal: soft, non-tender, he remains distended and tympanic, no rebound/guarding Integumentary: Laparotomy is CDI with staples, no erythema or drainage   Labs:     Latest Ref Rng & Units 10/16/2021    4:18 AM 10/14/2021    6:21 AM 10/12/2021    4:06 AM  CBC  WBC 4.0 - 10.5 K/uL 13.6  14.3  17.1   Hemoglobin 13.0 - 17.0 g/dL 9.2  9.3  10.5   Hematocrit 39.0 - 52.0 % 28.4  29.1  32.0   Platelets 150 - 400 K/uL 342  285  245       Latest Ref Rng & Units 10/17/2021    4:05 AM 10/16/2021    4:18 AM 10/15/2021   10:11 AM  CMP  Glucose 70 - 99 mg/dL 90  111  112   BUN 8 - 23 mg/dL '11  13  12   '$ Creatinine 0.61 - 1.24 mg/dL 0.90  0.89   0.81   Sodium 135 - 145 mmol/L 139  135  135   Potassium 3.5 - 5.1 mmol/L 3.6  3.6  3.7   Chloride 98 - 111 mmol/L 105  101  101   CO2 22 - 32 mmol/L '27  29  24   '$ Calcium 8.9 - 10.3 mg/dL 8.4  8.2  8.5     Imaging studies: No new pertinent imaging studies   Assessment/Plan:  87 y.o. male with persistent, but potentially improving, post-operative ileus 6 Days Post-Op s/p hand assisted laparoscopic converted to open right hemicolectomy for right colon CA (pathology showed adenocarcinoma, 1/9 lymph nodes).   - Continue NGT decompression for now; output still high, bilious appearing, and he remains distended. He has had a BM/Flatus this morning which is encouraging.  - Will get repeat KUB this morning to assess for progress   - NPO + IVF: may need PICC/TPN today vs tomorrow    - Monitor abdominal examination; on-going bowel function   - Pain control prn; antiemetics prn - Mobilization as tolerated; doing well    - Discharge Planning; Still with ileus, not ready  All of the above findings and recommendations were discussed with the patient, and the medical team, and  all of patient's questions were answered to his expressed satisfaction.  -- Edison Simon, PA-C [facility] 10/17/2021, 11:52 AM M-F: 7am - 4pm

## 2021-10-17 NOTE — Care Management Important Message (Signed)
Important Message  Patient Details  Name: Phillip Nelson MRN: [id number] Date of Birth: 02-05-1935   Medicare Important Message Given:  Yes     [name] 10/17/2021, 11:25 AM

## 2021-10-17 NOTE — Progress Notes (Signed)
Peripherally Inserted Central Catheter Placement  The IV Nurse has discussed with the patient and/or persons authorized to consent for the patient, the purpose of this procedure and the potential benefits and risks involved with this procedure.  The benefits include less needle sticks, lab draws from the catheter, and the patient may be discharged home with the catheter. Risks include, but not limited to, infection, bleeding, blood clot (thrombus formation), and puncture of an artery; nerve damage and irregular heartbeat and possibility to perform a PICC exchange if needed/ordered by physician.  Alternatives to this procedure were also discussed.  Bard Power PICC patient education guide, fact sheet on infection prevention and patient information card has been provided to patient /or left at bedside.    PICC Placement Documentation  PICC Double Lumen 42/10/31 Right Basilic 38 cm 0 cm (Active)  Indication for Insertion or Continuance of Line Administration of hyperosmolar/irritating solutions (i.e. TPN, Vancomycin, etc.) 10/17/21 2218  Exposed Catheter (cm) 0 cm 10/17/21 2218  Site Assessment Clean, Dry, Intact 10/17/21 2218  Lumen #1 Status Saline locked;Flushed;Blood return noted 10/17/21 2218  Lumen #2 Status Saline locked;Flushed;Blood return noted 10/17/21 2218  Dressing Type Transparent;Securing device 10/17/21 2218  Dressing Status Antimicrobial disc in place;Clean, Dry, Intact 10/17/21 2218  Safety Lock Not Applicable 28/11/88 6773  Line Care Connections checked and tightened 10/17/21 2218  Line Adjustment (NICU/IV Team Only) No 10/17/21 2218  Dressing Intervention New dressing 10/17/21 2218  Dressing Change Due 10/24/21 10/17/21 2218       Rosalio Macadamia Chenice 10/17/2021, 10:19 PM

## 2021-10-17 NOTE — Consult Note (Addendum)
PHARMACY - TOTAL PARENTERAL NUTRITION CONSULT NOTE   Indication: Prolonged ileus  Patient Measurements: Height: '5\' 10"'$  (177.8 cm) Weight: 96.7 kg (213 lb 3 oz) IBW/kg (Calculated) : 73 TPN AdjBW (KG): 78.9 Body mass index is 30.59 kg/m.  Assessment:  Patient is a 87 y/o M with medical history including colon cancer, CAD / Hx MI, prostate cancer, HLD, OSA, GIB / PUD / AVM of duodenum, prediabetes, gout who underwent hand assisted laparoscopic converted to open right hemicolectomy for right colon cancer on 7/18. Post-operative course complicated by persistent ileus.   Glucose / Insulin: History of prediabetes. Has been normoglycemic this admission Electrolytes: Within normal limits Renal: Scr < 1 Hepatic: No transaminitis. LFTs within normal limits Intake / Output; MIVF: D5LR at 75 cc/hr GI Imaging: Consistent with post-operative ileus GI Surgeries / Procedures:  7/18: Hand assisted laparoscopic converted to open right hemicolectomy for right colon cancer  Central access: Pending PICC 7/24 TPN start date: 7/24 upon placement of PICC  Nutritional Goals: Goal TPN rate is 85 mL/hr (provides 110 g of protein and 2224 kcals per day)  RD Assessment: Estimated Needs Total Energy Estimated Needs: 2000-2300kcal/day Total Protein Estimated Needs: 100-115g/day Total Fluid Estimated Needs: 1.9-2.2L/day  Current Nutrition:  NPO  Plan:  --Start TPN at 42 mL/hr (1/2 rate) at 1800 TPN content at goal (85 mL/hr) Amino acids: 110 g Dextrose: 326 g Lipids: 67 g Kcal: 2224 Fluids: 2040 mL --Electrolytes in TPN: Na 44mq/L, K 529m/L, Ca 24m724mL, Mg 24mE76m, and Phos 124mm54m. Cl:Ac 1:1 Phos-Nak 1 packet TIDM + HS ordered since 7/20; phosphorous maintaining at lower limit of normal. Will continue to monitor need for ongoing supplementation --Add standard MVI and trace elements to TPN, thiamine x 3 days (day # 1 / 3) --Initiate Moderate q6h SSI and adjust as needed  --Reduce MIVF to 40 mL/hr  at 1800; anticipate discontinuing MIVF after TPN reaches goal rate --Monitor TPN labs on Mon/Thurs, daily until stable  Maghan Jessee Benita Gutter/2023,12:23 PM

## 2021-10-18 ENCOUNTER — Inpatient Hospital Stay: Payer: Medicare Other

## 2021-10-18 LAB — COMPREHENSIVE METABOLIC PANEL
ALT: 26 U/L (ref 0–44)
AST: 33 U/L (ref 15–41)
Albumin: 2.6 g/dL — ABNORMAL LOW (ref 3.5–5.0)
Alkaline Phosphatase: 66 U/L (ref 38–126)
Anion gap: 6 (ref 5–15)
BUN: 11 mg/dL (ref 8–23)
CO2: 27 mmol/L (ref 22–32)
Calcium: 8.1 mg/dL — ABNORMAL LOW (ref 8.9–10.3)
Chloride: 100 mmol/L (ref 98–111)
Creatinine, Ser: 0.85 mg/dL (ref 0.61–1.24)
GFR, Estimated: >60 mL/min (ref >60)
Glucose, Bld: 112 mg/dL — ABNORMAL HIGH (ref 70–99)
Potassium: 3.7 mmol/L (ref 3.5–5.1)
Sodium: 133 mmol/L — ABNORMAL LOW (ref 135–145)
Total Bilirubin: 0.7 mg/dL (ref 0.3–1.2)
Total Protein: 5.3 g/dL — ABNORMAL LOW (ref 6.5–8.1)

## 2021-10-18 LAB — CBC
HCT: 27.2 % — ABNORMAL LOW (ref 39.0–52.0)
Hemoglobin: 8.8 g/dL — ABNORMAL LOW (ref 13.0–17.0)
MCH: 29.1 pg (ref 26.0–34.0)
MCHC: 32.4 g/dL (ref 30.0–36.0)
MCV: 90.1 fL (ref 80.0–100.0)
Platelets: 368 10*3/uL (ref 150–400)
RBC: 3.02 MIL/uL — ABNORMAL LOW (ref 4.22–5.81)
RDW: 19.9 % — ABNORMAL HIGH (ref 11.5–15.5)
WBC: 12.3 10*3/uL — ABNORMAL HIGH (ref 4.0–10.5)
nRBC: 0 % (ref 0.0–0.2)

## 2021-10-18 LAB — GLUCOSE, CAPILLARY
Glucose-Capillary: 109 mg/dL — ABNORMAL HIGH (ref 70–99)
Glucose-Capillary: 116 mg/dL — ABNORMAL HIGH (ref 70–99)
Glucose-Capillary: 119 mg/dL — ABNORMAL HIGH (ref 70–99)
Glucose-Capillary: 121 mg/dL — ABNORMAL HIGH (ref 70–99)
Glucose-Capillary: 125 mg/dL — ABNORMAL HIGH (ref 70–99)
Glucose-Capillary: 145 mg/dL — ABNORMAL HIGH (ref 70–99)

## 2021-10-18 LAB — PHOSPHORUS: Phosphorus: 3.2 mg/dL (ref 2.5–4.6)

## 2021-10-18 LAB — TRIGLYCERIDES: Triglycerides: 126 mg/dL (ref <150)

## 2021-10-18 LAB — MAGNESIUM: Magnesium: 2 mg/dL (ref 1.7–2.4)

## 2021-10-18 MED ORDER — TRAVASOL 10 % IV SOLN
INTRAVENOUS | Status: AC
  Filled 2021-10-18: qty 1101.6

## 2021-10-18 NOTE — Consult Note (Signed)
PHARMACY - TOTAL PARENTERAL NUTRITION CONSULT NOTE   Indication: Prolonged ileus  Patient Measurements: Height: '5\' 10"'$  (177.8 cm) Weight: 97.5 kg (214 lb 15.2 oz) IBW/kg (Calculated) : 73 TPN AdjBW (KG): 78.9 Body mass index is 30.84 kg/m.  Assessment:  Patient is a 87 y/o M with medical history including colon cancer, CAD / Hx MI, prostate cancer, HLD, OSA, GIB / PUD / AVM of duodenum, prediabetes, gout who underwent hand assisted laparoscopic converted to open right hemicolectomy for right colon cancer on 7/18. Post-operative course complicated by persistent ileus.   Glucose / Insulin: History of prediabetes. Has been normoglycemic this admission Electrolytes: Within normal limits Renal: Scr < 1 Hepatic: No transaminitis. LFTs within normal limits Intake / Output; MIVF:  MIVF: LR at 40 cc/hr NGT output: ~ 900 mL (slowing). GI planning for possible clamp trial 7/25 PM GI Imaging: Consistent with post-operative ileus GI Surgeries / Procedures:  7/18: Hand assisted laparoscopic converted to open right hemicolectomy for right colon cancer  Central access: Pending PICC 7/24 TPN start date: 7/24 upon placement of PICC  Nutritional Goals: Goal TPN rate is 85 mL/hr (provides 110 g of protein and 2224 kcals per day)  RD Assessment: Estimated Needs Total Energy Estimated Needs: 2000-2300kcal/day Total Protein Estimated Needs: 100-115g/day Total Fluid Estimated Needs: 1.9-2.2L/day  Current Nutrition:  NPO  Plan: Advance to goal rate 85 mL/hr Amino acids: 110 g Dextrose: 326 g Lipids: 67 g Kcal: 2224 Fluids: 2040 mL --Electrolytes in TPN: Na 22mq/L, K 551m/L, Ca 54m47mL, Mg 54mE61m, and Phos 154mm47m. Cl:Ac 1:1 Phos-Nak 1 packet TIDM + HS ordered since 7/20; phosphorous maintaining at lower limit of normal. Will continue to monitor need for ongoing supplementation --Add standard MVI and trace elements to TPN, thiamine x 3 days (day # 2 / 3) --Initiate Moderate q6h SSI and  adjust as needed  -- Discontinue MIVF (slowing output of NGT with possible clamp trial and now advancing TPN to goal). --Monitor TPN labs on Mon/Thurs, daily until stable  CarolWynelle Cleveland/2023,9:31 AM

## 2021-10-18 NOTE — Progress Notes (Signed)
[facility] Hospital Day(s): 7.   Post op day(s): 7 Days Post-Op.   Interval History:  Patient seen and examined No acute events or new complaints overnight.  Patient reports he is feeling well Still distended but no abdominal pain No fever, chills, nausea, emesis Leukocytosis continues improving; down to 12.3K Hgb stable to 8.8 Renal function remains normal; sCr - 0.85; UO - 800 ccs + unmeasured  No significant electrolyte derangements NGT output recorded at 900 ccs; thinning He continues to have flatus and bowel movements   Vital signs in last 24 hours: [min-max] current  Temp:  [97.7 F (36.5 C)-99 F (37.2 C)] 97.7 F (36.5 C) (07/25 0553) Pulse Rate:  [89-98] 89 (07/25 0553) Resp:  [16-18] 18 (07/25 0553) BP: (122-147)/(61-80) 127/61 (07/25 0553) SpO2:  [96 %-98 %] 96 % (07/25 0553) Weight:  [92.9 kg-97.5 kg] 97.5 kg (07/25 0301)     Height: '5\' 10"'$  (177.8 cm) Weight: 97.5 kg BMI (Calculated): 30.84   Intake/Output last 2 shifts:  07/24 0701 - 07/25 0700 In: 6563.7 [I.V.:6563.7] Out: 1700 [Urine:800; Emesis/NG output:900]   Physical Exam:  Constitutional: alert, cooperative and no distress  HEENT: NGT in place; output bilious Respiratory: breathing non-labored at rest  Cardiovascular: regular rate and sinus rhythm  Gastrointestinal: soft, non-tender, distension is improved compared to yesterday, no rebound/guarding Integumentary: Laparotomy is CDI with staples, no erythema or drainage   Labs:     Latest Ref Rng & Units 10/18/2021    5:07 AM 10/16/2021    4:18 AM 10/14/2021    6:21 AM  CBC  WBC 4.0 - 10.5 K/uL 12.3  13.6  14.3   Hemoglobin 13.0 - 17.0 g/dL 8.8  9.2  9.3   Hematocrit 39.0 - 52.0 % 27.2  28.4  29.1   Platelets 150 - 400 K/uL 368  342  285       Latest Ref Rng & Units 10/18/2021    6:05 AM 10/17/2021   10:22 PM 10/17/2021    4:05 AM  CMP  Glucose 70 - 99 mg/dL 112  91  90   BUN 8 - 23 mg/dL '11  10  11    '$ Creatinine 0.61 - 1.24 mg/dL 0.85  0.82  0.90   Sodium 135 - 145 mmol/L 133  134  139   Potassium 3.5 - 5.1 mmol/L 3.7  3.8  3.6   Chloride 98 - 111 mmol/L 100  100  105   CO2 22 - 32 mmol/L '27  27  27   '$ Calcium 8.9 - 10.3 mg/dL 8.1  8.3  8.4   Total Protein 6.5 - 8.1 g/dL 5.3     Total Bilirubin 0.3 - 1.2 mg/dL 0.7     Alkaline Phos 38 - 126 U/L 66     AST 15 - 41 U/L 33     ALT 0 - 44 U/L 26       Imaging studies:   KUB (10/18/2021) personally reviewed showing persistent dilated loops of small bowel, there does appear to be air scattered in colon, and radiologist report reviewed below:  IMPRESSION: Similar gaseous distention of multiple loops of small bowel with gas seen in the large bowel, compatible with ileus   Assessment/Plan:  87 y.o. male with persistent, but potentially improving, post-operative ileus 7 Days Post-Op s/p hand assisted laparoscopic converted to open right hemicolectomy for right colon CA (pathology showed adenocarcinoma, 1/9 lymph nodes).   - I think he is making  slow clinical improvements. KUB still with dilated loops but there is gas in the colon and he is having bowel function. NGT output in last 24 hours is borderline at 900 ccs but this is much more clear and thinner than yesterday. He has about 300 ccs in his canister now (0900). I will come back around 1200. If his output has slowed, and he continues to do well, we may be able to do clamp trial this afternoon.   - Continue NPO + TPN, advance to goal   - Monitor abdominal examination; on-going bowel function   - Pain control prn; antiemetics prn - Mobilization as tolerated; doing well    - Discharge Planning; Still with ileus, not ready  All of the above findings and recommendations were discussed with the patient, and the medical team, and all of patient's questions were answered to his expressed satisfaction.  -- Edison Simon, PA-C [facility] 10/18/2021, 7:18 AM M-F: 7am -  4pm

## 2021-10-19 ENCOUNTER — Inpatient Hospital Stay: Payer: Medicare Other

## 2021-10-19 ENCOUNTER — Encounter: Payer: Self-pay | Admitting: Surgery

## 2021-10-19 LAB — BASIC METABOLIC PANEL
Anion gap: 5 (ref 5–15)
BUN: 16 mg/dL (ref 8–23)
CO2: 26 mmol/L (ref 22–32)
Calcium: 8.5 mg/dL — ABNORMAL LOW (ref 8.9–10.3)
Chloride: 107 mmol/L (ref 98–111)
Creatinine, Ser: 0.95 mg/dL (ref 0.61–1.24)
GFR, Estimated: >60 mL/min (ref >60)
Glucose, Bld: 119 mg/dL — ABNORMAL HIGH (ref 70–99)
Potassium: 4 mmol/L (ref 3.5–5.1)
Sodium: 138 mmol/L (ref 135–145)

## 2021-10-19 LAB — GLUCOSE, CAPILLARY
Glucose-Capillary: 124 mg/dL — ABNORMAL HIGH (ref 70–99)
Glucose-Capillary: 125 mg/dL — ABNORMAL HIGH (ref 70–99)
Glucose-Capillary: 123 mg/dL — ABNORMAL HIGH (ref 70–99)
Glucose-Capillary: 106 mg/dL — ABNORMAL HIGH (ref 70–99)

## 2021-10-19 LAB — CBC
HCT: 29.5 % — ABNORMAL LOW (ref 39.0–52.0)
Hemoglobin: 9.3 g/dL — ABNORMAL LOW (ref 13.0–17.0)
MCH: 27.8 pg (ref 26.0–34.0)
MCHC: 31.5 g/dL (ref 30.0–36.0)
MCV: 88.3 fL (ref 80.0–100.0)
Platelets: 419 10*3/uL — ABNORMAL HIGH (ref 150–400)
RBC: 3.34 MIL/uL — ABNORMAL LOW (ref 4.22–5.81)
RDW: 19.1 % — ABNORMAL HIGH (ref 11.5–15.5)
WBC: 13.1 10*3/uL — ABNORMAL HIGH (ref 4.0–10.5)
nRBC: 0 % (ref 0.0–0.2)

## 2021-10-19 LAB — SURGICAL PATHOLOGY

## 2021-10-19 MED ORDER — IOHEXOL 300 MG/ML  SOLN
100.0000 mL | Freq: Once | INTRAMUSCULAR | Status: AC | PRN
  Administered 2021-10-19: 1 mL via INTRAVENOUS

## 2021-10-19 MED ORDER — PANTOPRAZOLE SODIUM 40 MG IV SOLR
40.0000 mg | Freq: Two times a day (BID) | INTRAVENOUS | Status: DC
  Administered 2021-10-19 – 2021-10-21 (×5): 40 mg via INTRAVENOUS
  Filled 2021-10-19 (×5): qty 10

## 2021-10-19 MED ORDER — TRAVASOL 10 % IV SOLN
INTRAVENOUS | Status: AC
  Filled 2021-10-19: qty 1101.6

## 2021-10-19 MED ORDER — ALBUMIN HUMAN 25 % IV SOLN
25.0000 g | Freq: Once | INTRAVENOUS | Status: AC
  Administered 2021-10-19: 25 g via INTRAVENOUS
  Filled 2021-10-19: qty 100

## 2021-10-19 MED ORDER — IOHEXOL 9 MG/ML PO SOLN
500.0000 mL | ORAL | Status: AC
  Administered 2021-10-19 (×2): 500 mL via ORAL

## 2021-10-19 NOTE — Progress Notes (Addendum)
[place] [facility] Day(s): 8.   Post op day(s): 8 Days Post-Op.   Interval History:  Patient seen and examined NGT left clamped overnight but returned to suction at 0400 secondary to pain however he described this as indigestion.  Patient reports he feels okay this morning; no abdominal pain No fever, chills, nausea, emesis Leukocytosis stable; 13.1K Hgb stable to 9.3 Renal function remains normal; sCr - 0.95; UO - 800 ccs + unmeasured  No significant electrolyte derangements NGT output since 0400 recorded at 100 ccs; thinning He continues to have flatus and bowel movements  Vital signs in last 24 hours: [min-max] current  Temp:  [98.1 F (36.7 C)-98.2 F (36.8 C)] 98.1 F (36.7 C) (07/26 0513) Pulse Rate:  [84-92] 84 (07/26 0513) Resp:  [17-18] 17 (07/25 2004) BP: (131-141)/(62-66) 141/66 (07/26 0513) SpO2:  [96 %-98 %] 97 % (07/26 0513) Weight:  [98.1 kg] 98.1 kg (07/26 0340)     Height: '5\' 10"'$  (177.8 cm) Weight: 98.1 kg BMI (Calculated): 31.03   Intake/Output last 2 shifts:  07/25 0701 - 07/26 0700 In: 1521.3 [I.V.:1521.3] Out: 1400 [Urine:650; Emesis/NG output:750]   Physical Exam:  Constitutional: alert, cooperative and no distress  HEENT: NGT in place; output clearing Respiratory: breathing non-labored at rest  Cardiovascular: regular rate and sinus rhythm  Gastrointestinal: soft, non-tender, distension is improved compared to yesterday, no rebound/guarding Integumentary: Laparotomy is CDI with staples, no erythema or drainage   Labs:     Latest Ref Rng & Units 10/19/2021    4:15 AM 10/18/2021    5:07 AM 10/16/2021    4:18 AM  CBC  WBC 4.0 - 10.5 K/uL 13.1  12.3  13.6   Hemoglobin 13.0 - 17.0 g/dL 9.3  8.8  9.2   Hematocrit 39.0 - 52.0 % 29.5  27.2  28.4   Platelets 150 - 400 K/uL 419  368  342       Latest Ref Rng & Units 10/19/2021    4:15 AM 10/18/2021    6:05 AM 10/17/2021   10:22 PM  CMP  Glucose 70 - 99  mg/dL 119  112  91   BUN 8 - 23 mg/dL '16  11  10   '$ Creatinine 0.61 - 1.24 mg/dL 0.95  0.85  0.82   Sodium 135 - 145 mmol/L 138  133  134   Potassium 3.5 - 5.1 mmol/L 4.0  3.7  3.8   Chloride 98 - 111 mmol/L 107  100  100   CO2 22 - 32 mmol/L '26  27  27   '$ Calcium 8.9 - 10.3 mg/dL 8.5  8.1  8.3   Total Protein 6.5 - 8.1 g/dL  5.3    Total Bilirubin 0.3 - 1.2 mg/dL  0.7    Alkaline Phos 38 - 126 U/L  66    AST 15 - 41 U/L  33    ALT 0 - 44 U/L  26      Imaging studies:   KUB (10/19/2021) personally reviewed which continues to show dilated loops of small bowel, less air appreciable in colon, no gross gastric distension, and radiologist report reviewed below:  IMPRESSION: 1. Increasing gaseous distention of small bowel compatible with worsening ileus versus obstruction. 2. No free air. 3. New subcutaneous emphysema over the right side of the abdomen. These could represent occult pneumoperitoneum or pneumothorax. Without other explanation, CT of the abdomen and pelvis may be useful for further evaluation.   Assessment/Plan:  87 y.o. male with persistent, but potentially improving, post-operative ileus 8 Days Post-Op s/p hand assisted laparoscopic converted to open right hemicolectomy for right colon CA (pathology showed adenocarcinoma, 1/9 lymph nodes).   - Given lack of improvements in KUB and new question of subcutaneous air as well, I will go ahead and repeat CT Abdomen/Pelvis today to ensure no missed intra-abdominal process. He is not clinically behaving like he has pneumoperitoneum nor pneumothorax.    - Continue NGT for now to LIS; okay to clamp to administer PO contrast - Continue NPO + TPN, advance to goal  - Switch to IV PPI BID given complaints of indigestion; no evidence of blood via NGT  - Monitor abdominal examination; on-going bowel function   - Pain control prn; antiemetics prn - Mobilization as tolerated; doing well    - Discharge Planning; Not ready, pending CT  Abdomen/Pelvis   All of the above findings and recommendations were discussed with the patient, and the medical team, and all of patient's questions were answered to his expressed satisfaction.  -- Edison Simon, PA-C [facility] 10/19/2021, 7:05 AM M-F: 7am - 4pm

## 2021-10-19 NOTE — Progress Notes (Signed)
Received report from [employer] Added to secure chat with Freda Jackson who states to leave NGT clamped post-CT as patient tolerates.

## 2021-10-19 NOTE — Progress Notes (Signed)
Nutrition Follow-up  DOCUMENTATION CODES:   Not applicable  INTERVENTION:   -TPN management per pharmacy -RD will monitor for diet advancement and add supplements as appropriate  NUTRITION DIAGNOSIS:   Increased nutrient needs related to post-op healing as evidenced by estimated needs.  Ongoing  GOAL:   Patient will meet greater than or equal to 90% of their needs  Met with TPN  MONITOR:   Diet advancement, Labs, Weight trends, Skin, I & O's, Other (Comment) (TPN)  REASON FOR ASSESSMENT:   Malnutrition Screening Tool    ASSESSMENT:   87 y/o male with h/o BCC, prostate cancer, HLD, OSA, gout, CAD s/p CABG, NSTEMI, HTN, PUD, IDA and diverticulitis who is admitted with colon mass s/p laparoscopic converted to open right hemicolectomy with stapled ileocolostomy 7/18.  7/18- s/p Hand assisted Laparoscopic converted to open Right Hemicolectomy With stapled ileocolostomy 7/24- PICC placed, TPN initiated 7/25- NGT clamped  Reviewed I/O's: +121 ml x 24 hours and +7.2 L since admission  UOP: 650 ml x 24 hours  NGT output: 750 ml x 24 hours   Per general surgery notes, NGT left clamped overnight, but returned to suction at 0400 secondary to pain (indigestion). Plan for CT of abdomen and pelvis today to rule out intra-abdominal abscess. KUB not improving.   Pt NPO and remains on TPN for nutritional support. TPN infusing at goal rate of 85 ml/hr, which provides 2264 kcals and 110 grams protein, meeting 100% of estimated nutritional needs.   Medications reviewed.   Labs reviewed. K, Mg, andn Phos WDL. CBGS: 119-125 (inpatient orders for glycemic control are 0-15 units insulin aspart every 6 hours).    Diet Order:   Diet Order             Diet NPO time specified Except for: Ice Chips, Sips with Meds  Diet effective now                   EDUCATION NEEDS:   Education needs have been addressed  Skin:  Skin Assessment: Skin Integrity Issues: Skin Integrity  Issues:: Incisions Incisions: closed abdomen  Last BM:  10/19/21 (type 6)  Height:   Ht Readings from Last 1 Encounters:  10/11/21 _0  (1.778 m)    Weight:   Wt Readings from Last 1 Encounters:  10/19/21 98.1 kg    Ideal Body Weight:  75.45 kg  BMI:  Body mass index is 31.03 kg/m.  Estimated Nutritional Needs:   Kcal:  2000-2300kcal/day  Protein:  100-115g/day  Fluid:  1.9-2.2L/day    Loistine Chance, RD, LDN, [place] Registered Dietitian II Certified Diabetes Care and Education Specialist Please refer to [facility] for RD and/or RD on-call/weekend/after hours pager

## 2021-10-19 NOTE — Progress Notes (Signed)
Clamped NG at 1900 7/25, unclamped at 0340 7/26 r/t complaints of new lower abd pain since being clamped and start of slight nausea

## 2021-10-19 NOTE — Consult Note (Signed)
PHARMACY - TOTAL PARENTERAL NUTRITION CONSULT NOTE   Indication: Prolonged ileus  Patient Measurements: Height: '5\' 10"'$  (177.8 cm) Weight: 98.1 kg (216 lb 4.3 oz) IBW/kg (Calculated) : 73 TPN AdjBW (KG): 78.9 Body mass index is 31.03 kg/m.  Assessment:  Patient is a 87 y/o M with medical history including colon cancer, CAD / Hx MI, prostate cancer, HLD, OSA, GIB / PUD / AVM of duodenum, prediabetes, gout who underwent hand assisted laparoscopic converted to open right hemicolectomy for right colon cancer on 7/18. Post-operative course complicated by persistent ileus.   Glucose / Insulin: History of prediabetes. Has been normoglycemic this admission. CBG's 112-145 on 7/25 with 6 units of SSI administered. Electrolytes: Within normal limits Renal: Scr < 1 Hepatic: No transaminitis. LFTs within normal limits Intake / Output; MIVF:  MIVF: LR at 40 cc/hr > d/c'd on 7/25 NGT output: ~ 900 > 650 mL (slowing).  GI Imaging: Consistent with post-operative ileus GI Surgeries / Procedures:  7/18: Hand assisted laparoscopic converted to open right hemicolectomy for right colon cancer 7/26 KUB: increasing gaseous distension of small bowel compatible with worsening ileus vs obstruction; no free air; New subcutaneous emphysema over the right side of the abdomen.  Central access: Pending PICC 7/24 TPN start date: 7/24 upon placement of PICC  Nutritional Goals: Goal TPN rate is 85 mL/hr (provides 110 g of protein and 2224 kcals per day)  RD Assessment: Estimated Needs Total Energy Estimated Needs: 2000-2300kcal/day Total Protein Estimated Needs: 100-115g/day Total Fluid Estimated Needs: 1.9-2.2L/day  Current Nutrition:  NPO  Plan: Continue at goal rate 85 mL/hr Amino acids: 110 g Dextrose: 326 g Lipids: 67 g Kcal: 2224 Fluids: 2040 mL --Electrolytes in TPN: Na 10mq/L, K 513m/L, Ca 75m28mL, Mg 75mE60m, and Phos 175mm2m. Cl:Ac 1:1 Phos has been stable for multiple days. Discontinue  Phos-Nak 1 packet TIDM+HS. Phos with labs tomorrow AM. Will continue to monitor. --Add standard MVI and trace elements to TPN, thiamine x 3 days (day # 3 / 3) --Continue Moderate q6h SSI and adjust as needed  --Monitor TPN labs on Mon/Thurs, daily until stable --Surgical team planning for CT of abdomen and pelvis on 7/26  CarolWynelle Cleveland/2023,9:52 AM

## 2021-10-20 ENCOUNTER — Inpatient Hospital Stay: Payer: Medicare Other

## 2021-10-20 LAB — GLUCOSE, CAPILLARY
Glucose-Capillary: 114 mg/dL — ABNORMAL HIGH (ref 70–99)
Glucose-Capillary: 125 mg/dL — ABNORMAL HIGH (ref 70–99)
Glucose-Capillary: 111 mg/dL — ABNORMAL HIGH (ref 70–99)
Glucose-Capillary: 142 mg/dL — ABNORMAL HIGH (ref 70–99)
Glucose-Capillary: 108 mg/dL — ABNORMAL HIGH (ref 70–99)
Glucose-Capillary: 128 mg/dL — ABNORMAL HIGH (ref 70–99)

## 2021-10-20 LAB — COMPREHENSIVE METABOLIC PANEL
ALT: 31 U/L (ref 0–44)
AST: 28 U/L (ref 15–41)
Albumin: 3.4 g/dL — ABNORMAL LOW (ref 3.5–5.0)
Alkaline Phosphatase: 69 U/L (ref 38–126)
Anion gap: 6 (ref 5–15)
BUN: 21 mg/dL (ref 8–23)
CO2: 25 mmol/L (ref 22–32)
Calcium: 8.8 mg/dL — ABNORMAL LOW (ref 8.9–10.3)
Chloride: 106 mmol/L (ref 98–111)
Creatinine, Ser: 0.89 mg/dL (ref 0.61–1.24)
GFR, Estimated: >60 mL/min (ref >60)
Glucose, Bld: 119 mg/dL — ABNORMAL HIGH (ref 70–99)
Potassium: 4.3 mmol/L (ref 3.5–5.1)
Sodium: 137 mmol/L (ref 135–145)
Total Bilirubin: 0.9 mg/dL (ref 0.3–1.2)
Total Protein: 6.7 g/dL (ref 6.5–8.1)

## 2021-10-20 LAB — TRIGLYCERIDES: Triglycerides: 119 mg/dL (ref <150)

## 2021-10-20 LAB — PHOSPHORUS: Phosphorus: 2.9 mg/dL (ref 2.5–4.6)

## 2021-10-20 LAB — MAGNESIUM: Magnesium: 2.2 mg/dL (ref 1.7–2.4)

## 2021-10-20 MED ORDER — PROSOURCE PLUS PO LIQD
30.0000 mL | Freq: Two times a day (BID) | ORAL | Status: DC
  Administered 2021-10-20: 30 mL via ORAL
  Filled 2021-10-20: qty 30

## 2021-10-20 MED ORDER — TRAVASOL 10 % IV SOLN
INTRAVENOUS | Status: DC
  Filled 2021-10-20: qty 544.3

## 2021-10-20 MED ORDER — BOOST / RESOURCE BREEZE PO LIQD CUSTOM
1.0000 | Freq: Three times a day (TID) | ORAL | Status: DC
  Administered 2021-10-20: 1 via ORAL

## 2021-10-20 NOTE — Progress Notes (Signed)
POD # 9 NO evidence of free air, PTX  perforation or leak on CT He has no pain NGT clamped and No NB/V Multiple BM  PE NAD Abd: soft, NT incision healing well, no infection  A/P Doing well Resolving ileus Dc ngt Cld Wean off TPN  DC 24/48 hrs

## 2021-10-20 NOTE — Progress Notes (Signed)
Nutrition Follow-up  DOCUMENTATION CODES:   Not applicable  INTERVENTION:   -TPN management per pharmacy -Boost Breeze po TID, each supplement provides 250 kcal and 9 grams of protein  -30 ml Prosource Plus BID, each supplement provides 100 kcals and 15 grams protein  NUTRITION DIAGNOSIS:   Increased nutrient needs related to post-op healing as evidenced by estimated needs.  Ongoing  GOAL:   Patient will meet greater than or equal to 90% of their needs  Met with TPN  MONITOR:   Diet advancement, Labs, Weight trends, Skin, I & O's, Other (Comment) (TPN)  REASON FOR ASSESSMENT:   Malnutrition Screening Tool    ASSESSMENT:   87 y/o male with h/o BCC, prostate cancer, HLD, OSA, gout, CAD s/p CABG, NSTEMI, HTN, PUD, IDA and diverticulitis who is admitted with colon mass s/p laparoscopic converted to open right hemicolectomy with stapled ileocolostomy 7/18.  7/18- s/p Hand assisted Laparoscopic converted to open Right Hemicolectomy With stapled ileocolostomy 7/24- PICC placed, TPN initiated 7/25- NGT clamped 7/26- NGT clamped 7/27- NGT d/c, advanced to clear liquid diet  Reviewed I/O's: +1.8 L x 24 hours and +9.1 L since admission  UOP: 300 ml x 24 hours   Per general surgery notes, no evidence of free air, PTX, or leak on CT. Pt has had multiple bowel movements. NGT has been d/c and pt has been started on clear liquid diet. No meal completion data currently available to assess at this time.   Pt remains on TPN for nutritional support. TPN infusing at goal rate of 85 ml/hr, which provides 2264 kcals and 110 grams protein, meeting 100% of estimated nutritional needs. Per pharmacy notes, plan to reduce TPN to 42 ml/hr, which provides 1098 kcals and 54 grams protein, which provides 55% of estimated kcals needs and 54% of estimated protein needs.    Labs reviewed: CBGS: 111-142 (inpatient orders for glycemic control are 0-15 units insulin aspart every 6 hours).    Diet  Order:   Diet Order             Diet clear liquid Room service appropriate? Yes; Fluid consistency: Thin  Diet effective now                   EDUCATION NEEDS:   Education needs have been addressed  Skin:  Skin Assessment: Skin Integrity Issues: Skin Integrity Issues:: Incisions Incisions: closed abdomen  Last BM:  10/19/21 (type 6)  Height:   Ht Readings from Last 1 Encounters:  10/11/21 _0  (1.778 m)    Weight:   Wt Readings from Last 1 Encounters:  10/20/21 95.4 kg    Ideal Body Weight:  75.45 kg  BMI:  Body mass index is 30.18 kg/m.  Estimated Nutritional Needs:   Kcal:  2000-2300kcal/day  Protein:  100-115g/day  Fluid:  1.9-2.2L/day    Loistine Chance, RD, LDN, [place] Registered Dietitian II Certified Diabetes Care and Education Specialist Please refer to [facility] for RD and/or RD on-call/weekend/after hours pager

## 2021-10-20 NOTE — Consult Note (Signed)
PHARMACY - TOTAL PARENTERAL NUTRITION CONSULT NOTE   Indication: Prolonged ileus  Patient Measurements: Height: '5\' 10"'$  (177.8 cm) Weight: 95.4 kg (210 lb 5.1 oz) IBW/kg (Calculated) : 73 TPN AdjBW (KG): 78.9 Body mass index is 30.18 kg/m.  Assessment:  Patient is a 87 y/o M with medical history including colon cancer, CAD / Hx MI, prostate cancer, HLD, OSA, GIB / PUD / AVM of duodenum, prediabetes, gout who underwent hand assisted laparoscopic converted to open right hemicolectomy for right colon cancer on 7/18. Post-operative course complicated by persistent ileus.   Glucose / Insulin: History of prediabetes. Has been normoglycemic this admission. CBG's 106-145 on 7/25 with 6 units of SSI administered. Electrolytes: Within normal limits Renal: Scr < 1 Hepatic: No transaminitis. LFTs within normal limits Intake / Output; MIVF:  MIVF: LR at 40 cc/hr > d/c'd on 7/25 NGT output: ~ 900 > 650 mL > 0 mL (NG clamped 7/26) GI Imaging: Consistent with post-operative ileus GI Surgeries / Procedures:  7/18: Hand assisted laparoscopic converted to open right hemicolectomy for right colon cancer 7/26 KUB: increasing gaseous distension of small bowel compatible with worsening ileus vs obstruction; no free air; New subcutaneous emphysema over the right side of the abdomen.  Central access: Pending PICC 7/24 TPN start date: 7/24 upon placement of PICC  Nutritional Goals: Goal TPN rate is 85 mL/hr (provides 110 g of protein and 2224 kcals per day)  RD Assessment: Estimated Needs Total Energy Estimated Needs: 2000-2300kcal/day Total Protein Estimated Needs: 100-115g/day Total Fluid Estimated Needs: 1.9-2.2L/day  Current Nutrition:  Thin liquids - diet advanced from NPO to thin liquids. Tolerating diet without n/v per RN.  Plan: Reduce TPN to 42 mL/hr (1/2 rate) at 1800 Amino acids: 110 g Dextrose: 326 g Lipids: 67 g Kcal: 2224 Fluids: 2040 mL --Electrolytes in TPN: Na 41mq/L, K  551m/L, Ca 75m40mL, Mg 75mE64m, and Phos 175mm27m. Cl:Ac 1:1 --Add standard MVI and trace elements to TPN Completed course of thiamine 7/26 --Continue Moderate q6h SSI and adjust as needed  --Monitor TPN labs on Mon/Thurs, daily until stable  CarolMarriott/2023,9:54 AM

## 2021-10-20 NOTE — Progress Notes (Signed)
Patient's NGT remained clamped this shift. No c/o n/v or abd pain. (+) BM x2, (+) flatus.

## 2021-10-21 LAB — GLUCOSE, CAPILLARY
Glucose-Capillary: 102 mg/dL — ABNORMAL HIGH (ref 70–99)
Glucose-Capillary: 111 mg/dL — ABNORMAL HIGH (ref 70–99)
Glucose-Capillary: 115 mg/dL — ABNORMAL HIGH (ref 70–99)

## 2021-10-21 MED ORDER — HYDROCODONE-ACETAMINOPHEN 5-325 MG PO TABS: 1.0000 | ORAL_TABLET | Freq: Four times a day (QID) | ORAL | 0 refills | Status: DC | PRN

## 2021-10-21 NOTE — Discharge Summary (Signed)
Patient ID: Phillip Nelson MRN: [id number] DOB/AGE: 09/22/1934 87 y.o.  Admit date: 10/11/2021 Discharge date: 10/21/2021   Discharge Diagnoses:  Principal Problem:   Colon cancer [facility]) Active Problems:   Hypotension due to hypovolemia   Procedures:Right colectomy  Hospital Course:  Pt with known dx of adenoCA right colon.  Underwent open colectomy due to some intraoperative bleeding.  He initially had an uneventful postoperative course but postoperative day #3 developed an ileus that lasted close to a week.  He needed a PICC line and TPN and NG tube decompression.  He is slowly improved.  We made sure we did CT scan of the abdomen pelvis without any major findings the anastomosis was patent there was no evidence of an abscess or infection.  He continues to do well and his NG tube was removed and his diet was advanced. At The time of discharge the patient was ambulating,  pain was controlled.  His vital signs were stable and she was afebrile.   physical exam at discharge showed a pt  in no acute distress.  Awake and alert.  Abdomen: Soft incisions healing well without infection or peritonitis. Staples removed. Extremities well-perfused and no edema.  Condition of the patient the time of discharge was stable    Disposition: Discharge disposition: 01-Home or Self Care       Discharge Instructions     Call MD for:  difficulty breathing, headache or visual disturbances   Complete by: As directed    Call MD for:  extreme fatigue   Complete by: As directed    Call MD for:  hives   Complete by: As directed    Call MD for:  persistant dizziness or light-headedness   Complete by: As directed    Call MD for:  persistant nausea and vomiting   Complete by: As directed    Call MD for:  redness, tenderness, or signs of infection (pain, swelling, redness, odor or green/yellow discharge around incision site)   Complete by: As directed    Call MD for:  severe uncontrolled pain    Complete by: As directed    Call MD for:  temperature >100.4   Complete by: As directed    Diet - low sodium heart healthy   Complete by: As directed    Discharge instructions   Complete by: As directed    SHower daily   Increase activity slowly   Complete by: As directed    Lifting restrictions   Complete by: As directed    20 lbs x 5 weeks      Allergies as of 10/21/2021       Reactions   Indocin [indomethacin] Other (See Comments)   Shakes. tremors   Iodinated Contrast Media Hives   Penicillins Itching, Rash, Other (See Comments)   Did it involve swelling of the face/tongue/throat, SOB, or low BP? Unknown Did it involve sudden or severe rash/hives, skin peeling, or any reaction on the inside of your mouth or nose? Unknown Did you need to seek medical attention at a hospital or doctor's office? Unknown When did it last happen? unknown If all above answers are "NO", may proceed with cephalosporin use.        Medication List     STOP taking these medications    metroNIDAZOLE 500 MG tablet Commonly known as: FLAGYL   neomycin 500 MG tablet Commonly known as: MYCIFRADIN   polyethylene glycol 17 g packet Commonly known as: MIRALAX / GLYCOLAX  TAKE these medications    allopurinol 300 MG tablet Commonly known as: ZYLOPRIM Take 1 tablet (300 mg total) by mouth daily.   aspirin EC 81 MG tablet Take 81 mg by mouth as needed.   bisacodyl 5 MG EC tablet Commonly known as: DULCOLAX Take 5 mg by mouth once. Before surgery   cyanocobalamin 1000 MCG tablet Commonly known as: VITAMIN B12 Take 1,000 mcg by mouth daily.   Fusion Plus Caps Take 1 capsule by mouth daily.   HYDROcodone-acetaminophen 5-325 MG tablet Commonly known as: NORCO/VICODIN Take 1-2 tablets by mouth every 6 (six) hours as needed for moderate pain.   nitroGLYCERIN 0.4 MG SL tablet Commonly known as: NITROSTAT Place 1 tablet (0.4 mg total) under the tongue every 5 (five) minutes as  needed for chest pain.   Potassium 99 MG Tabs Take 297 mg by mouth daily.        Follow-up Information     Tylene Fantasia, PA-C Follow up on 10/27/2021.   Specialty: Physician Assistant Contact information: [address] [place] [address] [phone]                  Caroleen Hamman, MD FACS

## 2021-10-21 NOTE — Care Management Important Message (Signed)
Important Message  Patient Details  Name: Phillip Nelson MRN: [id number] Date of Birth: [birth year]   Medicare Important Message Given:  Yes     Juliann Pulse A Hanadi Stanly 10/21/2021, 10:56 AM

## 2021-10-21 NOTE — Progress Notes (Signed)
Nutrition Follow-up  DOCUMENTATION CODES:   Not applicable  INTERVENTION:   -D/c Boost Breeze -D/c Prosource Plus -RD will follow for diet advancement -Encouraged adequate oral intake -TPN management per pharmacy  NUTRITION DIAGNOSIS:   Increased nutrient needs related to post-op healing as evidenced by estimated needs.  Ongoing  GOAL:   Patient will meet greater than or equal to 90% of their needs  Progressing   MONITOR:   Diet advancement, Labs, Weight trends, Skin, I & O's, Other (Comment) (TPN)  REASON FOR ASSESSMENT:   Malnutrition Screening Tool    ASSESSMENT:   87 y/o male with h/o BCC, prostate cancer, HLD, OSA, gout, CAD s/p CABG, NSTEMI, HTN, PUD, IDA and diverticulitis who is admitted with colon mass s/p laparoscopic converted to open right hemicolectomy with stapled ileocolostomy 7/18.  7/18- s/p Hand assisted Laparoscopic converted to open Right Hemicolectomy With stapled ileocolostomy 7/24- PICC placed, TPN initiated 7/25- NGT clamped 7/26- NGT clamped 7/27- NGT d/c, advanced to clear liquid diet 7/28- advanced to full liquid diet, advanced to soft diet  Reviewed I/O's: +576 ml x 24 hours and +9.6 L since admission  UOP: 400 ml x 24 hours   Spoke with pt at bedside, who reports feeling better today. He likes the full liquid diet and consumed about 75% of his tray this morning. He denies any nausea, vomiting, or abdominal pain. He is anxious to go home, but understands the need to stay in the hospital until medically ready. Noted meal completions 90-100%. Pt declines offer of supplements. Discussed importance of good meal intake to promote healing.   TPN has been d/c today.   Per MD notes, plan to discharge home today.   Labs reviewed: CBGS: 102-128 (inpatient orders for glycemic control are 0-15 units insulin aspart every 6 hours).    Diet Order:   Diet Order             DIET SOFT Room service appropriate? Yes; Fluid consistency: Thin  Diet  effective now           Diet - low sodium heart healthy                   EDUCATION NEEDS:   Education needs have been addressed  Skin:  Skin Assessment: Skin Integrity Issues: Skin Integrity Issues:: Incisions Incisions: closed abdomen  Last BM:  10/20/21  Height:   Ht Readings from Last 1 Encounters:  10/11/21 '5\' 10"'$  (1.778 m)    Weight:   Wt Readings from Last 1 Encounters:  10/21/21 95.2 kg    Ideal Body Weight:  75.45 kg  BMI:  Body mass index is 30.11 kg/m.  Estimated Nutritional Needs:   Kcal:  2000-2300kcal/day  Protein:  100-115g/day  Fluid:  1.9-2.2L/day    Loistine Chance, RD, LDN, [place] Registered Dietitian II Certified Diabetes Care and Education Specialist Please refer to [facility] for RD and/or RD on-call/weekend/after hours pager

## 2021-10-21 NOTE — Progress Notes (Signed)
PICC line removed, patient tolerated well. Assisted student PA, Ryan with removing staples and placing steristrips. 22 staples removed, patient tolerated well. Patient discharge instructions reviewed and all questions answered.

## 2021-10-21 NOTE — Progress Notes (Signed)
Mobility Specialist - Progress Note    10/21/21 1035  Mobility  Activity Ambulated with assistance in hallway  Level of Assistance Independent after set-up  Assistive Device Front wheel walker  Distance Ambulated (ft) 160 ft  Activity Response Tolerated well  $Mobility charge 1 Mobility    Pt in recliner on RA upon arrival. PT STS and ambulates 1 lap around NS. Pt in recliner with needs in reach.   Gretchen Short  Mobility Specialist  10/21/21 10:40 AM

## 2021-10-24 ENCOUNTER — Other Ambulatory Visit: Payer: Self-pay | Admitting: *Deleted

## 2021-10-24 NOTE — Patient Outreach (Signed)
  Care Coordination [facility] Note Transition Care Management Follow-up Telephone Call Date of discharge and from where: 10/21/21 [facility] How have you been since you were released from the hospital? Patient reports that he is feeling good and that he is not in any pain. Any questions or concerns? No  Items Reviewed: Did the pt receive and understand the discharge instructions provided? Yes  Medications obtained and verified? Yes  Other? No  Any new allergies since your discharge? No  Dietary orders reviewed? Yes Do you have support at home? Yes   Home Care and Equipment/Supplies: Were home health services ordered? no If so, what is the name of the agency? N/A  Has the agency set up a time to come to the patient's home? not applicable Were any new equipment or medical supplies ordered?  No What is the name of the medical supply agency? N/A Were you able to get the supplies/equipment? not applicable Do you have any questions related to the use of the equipment or supplies? No  Functional Questionnaire: (I = Independent and D = Dependent) ADLs: I  Bathing/Dressing- I  Meal Prep- I  Eating- I  Maintaining continence- I  Transferring/Ambulation- I  Managing Meds- I  Follow up appointments reviewed:  PCP Hospital f/u appt confirmed? No   Specialist Hospital f/u appt confirmed? Yes  Scheduled to see [facility] on 10/27/21  Are transportation arrangements needed? No  If their condition worsens, is the pt aware to call PCP or go to the Emergency Dept.? Yes Was the patient provided with contact information for the PCP's office or ED? Yes Was to pt encouraged to call back with questions or concerns? Yes  SDOH assessments and interventions completed:   Yes  Care Coordination Interventions Activated:  No Care Coordination Interventions:   N/A  Encounter Outcome:  Pt. Visit Completed  Emelia Loron RN, BSN [place] [phone] [email or web address]

## 2021-10-27 ENCOUNTER — Ambulatory Visit (INDEPENDENT_AMBULATORY_CARE_PROVIDER_SITE_OTHER): Payer: Medicare Other | Admitting: Physician Assistant

## 2021-10-27 ENCOUNTER — Encounter: Payer: Self-pay | Admitting: Physician Assistant

## 2021-10-27 ENCOUNTER — Other Ambulatory Visit: Payer: Self-pay

## 2021-10-27 VITALS — BP 158/85 | HR 96 | Temp 98.1°F | Ht 70.0 in | Wt 203.8 lb

## 2021-10-27 DIAGNOSIS — C189 Malignant neoplasm of colon, unspecified: Secondary | ICD-10-CM

## 2021-10-27 DIAGNOSIS — Z09 Encounter for follow-up examination after completed treatment for conditions other than malignant neoplasm: Secondary | ICD-10-CM

## 2021-10-27 NOTE — Patient Instructions (Signed)

## 2021-10-27 NOTE — Progress Notes (Signed)
AKI only

## 2021-10-27 NOTE — Progress Notes (Signed)
Post-operative Ileus is integral to the surgical procedure and is not a complication

## 2021-10-27 NOTE — Progress Notes (Signed)
[facility] POST-OP OFFICE VISIT  10/27/2021  HPI: Phillip Nelson is a 87 y.o. male 16 days s/p Hand assisted Laparoscopic converted to open Right Hemicolectomy With stapled ileocolostomy for right colon CA with Dr Dahlia Byes.   He has done remarkably well No abdominal pain No fever, chills, nausea, emesis He is tolerating PO, normal bowel function No issues with incision   Vital signs: BP (!) 158/85   Pulse 96   Temp 98.1 F (36.7 C) (Oral)   Ht '5\' 10"'$  (1.778 m)   Wt 203 lb 12.8 oz (92.4 kg)   SpO2 99%   BMI 29.24 kg/m    Physical Exam: Constitutional: Well appearing male, NAD Abdomen: Soft, non-tender, non-distended, no rebound/guarding Skin: Laparotomy is healed well; steri-strips removed, no erythema, no drainage   Assessment/Plan: This is a 87 y.o. male 16 days s/p Hand assisted Laparoscopic converted to open Right Hemicolectomy With stapled ileocolostomy for right colon CA with Dr Dahlia Byes.    - Pain control prn  - Reviewed wound care recommendation  - Reviewed lifting restrictions; 6 weeks total  - He wishes to follow up on an as needed basis; He understands to call with questions/concerns  -- Edison Simon, PA-C [facility] 10/27/2021, 11:05 AM M-F: 7am - 4pm

## 2021-12-05 ENCOUNTER — Encounter: Payer: Self-pay | Admitting: *Deleted

## 2021-12-13 ENCOUNTER — Encounter: Payer: Self-pay | Admitting: Family Medicine

## 2021-12-13 ENCOUNTER — Ambulatory Visit (INDEPENDENT_AMBULATORY_CARE_PROVIDER_SITE_OTHER): Payer: Medicare Other | Admitting: Family Medicine

## 2021-12-13 VITALS — BP 128/76 | HR 87 | Temp 97.7°F | Ht 70.0 in | Wt 207.8 lb

## 2021-12-13 DIAGNOSIS — M109 Gout, unspecified: Secondary | ICD-10-CM

## 2021-12-13 DIAGNOSIS — M1A9XX0 Chronic gout, unspecified, without tophus (tophi): Secondary | ICD-10-CM

## 2021-12-13 DIAGNOSIS — D500 Iron deficiency anemia secondary to blood loss (chronic): Secondary | ICD-10-CM | POA: Diagnosis not present

## 2021-12-13 DIAGNOSIS — I2510 Atherosclerotic heart disease of native coronary artery without angina pectoris: Secondary | ICD-10-CM | POA: Diagnosis not present

## 2021-12-13 DIAGNOSIS — Z23 Encounter for immunization: Secondary | ICD-10-CM | POA: Diagnosis not present

## 2021-12-13 NOTE — Progress Notes (Signed)
    SUBJECTIVE:   CHIEF COMPLAINT / HPI: To establish care  Presents to clinic to establish care  No acute concerns today  Patient self discontinued all medications to help himself heal from surgery.  HLD Self discontinued statin.  CAD Self discontinued ASA.  Reports no chest pain.  Has not had to use Nitroglycerin tabs.    Gout Self discontinued Allopurinol.  Has not has any recent flare up since stopping medications.  S/p Right hemicolectomy Doing well since surgery.  Remains active.  Had follow up with surgery.  No concerns.  Stool now starting to become more formed.  Denies any bloody stool.   PERTINENT  PMH / PSH:  Right: CVA status post right hemicolectomy CAD Stable Angina Bilateral Carotid Stenosis H/O Prostate Ca   OBJECTIVE:   BP 128/76 (BP Location: Left Arm, Patient Position: Sitting, Cuff Size: Normal)   Pulse 87   Temp 97.7 F (36.5 C) (Oral)   Ht '5\' 10"'$  (1.778 m)   Wt 207 lb 12.8 oz (94.3 kg)   SpO2 99%   BMI 29.82 kg/m    General: Alert, no acute distress Cardio: Normal S1 and S2, RRR, no r/m/g Pulm: CTAB, normal work of breathing Abdomen: Bowel sounds normal. Abdomen soft and non-tender.  Extremities: No peripheral edema.  Neuro: Cranial nerves grossly intact   ASSESSMENT/PLAN:   Gout No acute flare.  Last uric acid level 6.5 on 08/22 Self discontinued Allopurinol since recent hemicolectomy  Continue to monitor, if another showing signs of symptoms can resume.   Anemia Asymptomatic.  Recent Hbg 9.3 s/p right hemicolectomy 07/23.  Self discontinued iron supplements -Restart iron supplements -Follow up in 4 weeks. Plan for labs at that time  Coronary artery disease involving native coronary artery of native heart without angina pectoris Asymptomatic.  Self discontinued ASA and statin s/p right hemicolectomy.  -Follow up with Cardiology scheduled 11/09    HCM Flu vaccine today Shingles vaccine at next visit   [place]  [name], MD

## 2021-12-13 NOTE — Assessment & Plan Note (Signed)
Asymptomatic.  Self discontinued ASA and statin s/p right hemicolectomy.  -Follow up with Cardiology scheduled 11/09

## 2021-12-13 NOTE — Assessment & Plan Note (Signed)
No acute flare.  Last uric acid level 6.5 on 08/22 Self discontinued Allopurinol since recent hemicolectomy  Continue to monitor, if another showing signs of symptoms can resume.

## 2021-12-13 NOTE — Assessment & Plan Note (Signed)
Asymptomatic.  Recent Hbg 9.3 s/p right hemicolectomy 07/23.  Self discontinued iron supplements -Restart iron supplements -Follow up in 4 weeks. Plan for labs at that time

## 2021-12-13 NOTE — Patient Instructions (Addendum)
It was a pleasure meeting you today. Thank you for allowing me to take part in your health care.  Our goals for today as we discussed include:  Follow-up with general surgery as scheduled  Follow-up with cardiology as scheduled  Your have received flu vaccine today  If you had not had the pneumonia vaccine we will get that to you at your next visit.  Recommend Shingles vaccine.  This is a 2 dose series and can be given at your local pharmacy.  Please talk to your pharmacist about this.  Please follow-up with PCP in 4-6 weeks  If you have any questions or concerns, please do not hesitate to call the office at [phone].  I look forward to our next visit and until then take care and stay safe.  Regards,   [name], MD   [facility]

## 2021-12-16 ENCOUNTER — Other Ambulatory Visit
Admission: RE | Admit: 2021-12-16 | Discharge: 2021-12-16 | Disposition: A | Discharge status: Home / Self Care | Payer: Medicare Other | Source: Ambulatory Visit | Attending: Cardiology | Admitting: Cardiology

## 2021-12-16 DIAGNOSIS — Z79899 Other long term (current) drug therapy: Secondary | ICD-10-CM | POA: Insufficient documentation

## 2021-12-16 DIAGNOSIS — D649 Anemia, unspecified: Secondary | ICD-10-CM | POA: Insufficient documentation

## 2021-12-16 DIAGNOSIS — E785 Hyperlipidemia, unspecified: Secondary | ICD-10-CM | POA: Diagnosis not present

## 2021-12-16 LAB — HEPATIC FUNCTION PANEL
ALT: 21 U/L (ref 0–44)
AST: 25 U/L (ref 15–41)
Albumin: 4.1 g/dL (ref 3.5–5.0)
Alkaline Phosphatase: 68 U/L (ref 38–126)
Bilirubin, Direct: 0.2 mg/dL (ref 0.0–0.2)
Indirect Bilirubin: 1 mg/dL — ABNORMAL HIGH (ref 0.3–0.9)
Total Bilirubin: 1.2 mg/dL (ref 0.3–1.2)
Total Protein: 7.8 g/dL (ref 6.5–8.1)

## 2021-12-16 LAB — IRON AND TIBC
Iron: 32 ug/dL — ABNORMAL LOW (ref 45–182)
Saturation Ratios: 7 % — ABNORMAL LOW (ref 17.9–39.5)
TIBC: 442 ug/dL (ref 250–450)
UIBC: 410 ug/dL

## 2021-12-16 LAB — LIPID PANEL
Cholesterol: 171 mg/dL (ref 0–200)
HDL: 63 mg/dL (ref >40)
LDL Cholesterol: 85 mg/dL (ref 0–99)
Total CHOL/HDL Ratio: 2.7 RATIO
Triglycerides: 113 mg/dL (ref <150)
VLDL: 23 mg/dL (ref 0–40)

## 2021-12-16 LAB — VITAMIN B12: Vitamin B-12: 201 pg/mL (ref 180–914)

## 2021-12-16 LAB — FOLATE: Folate: 21.1 ng/mL (ref >5.9)

## 2021-12-16 LAB — FERRITIN: Ferritin: 13 ng/mL — ABNORMAL LOW (ref 24–336)

## 2021-12-21 NOTE — Progress Notes (Signed)
Thank you for the update.  Will follow up.

## 2021-12-22 ENCOUNTER — Other Ambulatory Visit: Payer: Self-pay | Admitting: Family

## 2021-12-22 ENCOUNTER — Other Ambulatory Visit: Payer: Self-pay

## 2021-12-22 MED ORDER — IRON (FERROUS SULFATE) 325 (65 FE) MG PO TABS: 325.0000 mg | ORAL_TABLET | Freq: Every day | ORAL | 3 refills | Status: DC

## 2021-12-26 NOTE — Progress Notes (Signed)
noted 

## 2022-01-11 ENCOUNTER — Encounter: Payer: Self-pay | Admitting: Family Medicine

## 2022-01-11 ENCOUNTER — Ambulatory Visit (INDEPENDENT_AMBULATORY_CARE_PROVIDER_SITE_OTHER): Payer: Medicare Other | Admitting: Family Medicine

## 2022-01-11 VITALS — BP 128/74 | HR 79 | Temp 97.5°F | Ht 70.0 in | Wt 209.0 lb

## 2022-01-11 DIAGNOSIS — D508 Other iron deficiency anemias: Secondary | ICD-10-CM

## 2022-01-11 DIAGNOSIS — Z23 Encounter for immunization: Secondary | ICD-10-CM | POA: Diagnosis not present

## 2022-01-11 DIAGNOSIS — K6389 Other specified diseases of intestine: Secondary | ICD-10-CM | POA: Diagnosis not present

## 2022-01-11 LAB — CBC
HCT: 41 % (ref 39.0–52.0)
Hemoglobin: 13.2 g/dL (ref 13.0–17.0)
MCHC: 32.1 g/dL (ref 30.0–36.0)
MCV: 85.8 fl (ref 78.0–100.0)
Platelets: 346 10*3/uL (ref 150.0–400.0)
RBC: 4.78 Mil/uL (ref 4.22–5.81)
RDW: 23.8 % — ABNORMAL HIGH (ref 11.5–15.5)
WBC: 11.4 10*3/uL — ABNORMAL HIGH (ref 4.0–10.5)

## 2022-01-11 NOTE — Assessment & Plan Note (Addendum)
Asymptomatic. No signs of bleeding.  Reviewed recent lab results. Hbg 9.3.  Ferritin 13. Iron 32.  Since these labs drawn patient has resumed iron supplements daily. -CBC today -Continue Ferrous sulfate 325 mg daily -Follow up in 3 months -Strict return precautions provided

## 2022-01-11 NOTE — Progress Notes (Signed)
    SUBJECTIVE:   CHIEF COMPLAINT / HPI: follow up anemia  Presents for follow up for asymptomatic anemia. Seen in clinic on 09/19 to establish care.  History of anemia secondary to intraoperative bleeding s/p right lap colectomy converted to open colectomy 09/2021. Doing well since surgery and no symptoms. Had self discontinued iron supplements. Since then patient reports he has been taking iron tablets daily as recommended. Denies any headaches, shortness of breath or chest pain.   PERTINENT  PMH / PSH:  Colon adenocarcinoma s/p right colectomy CAD H/O GI Bleed HLD   OBJECTIVE:   BP 128/74 (BP Location: Left Arm, Patient Position: Sitting, Cuff Size: Normal)   Pulse 79   Temp (!) 97.5 F (36.4 C) (Oral)   Ht '5\' 10"'$  (1.778 m)   Wt 209 lb (94.8 kg)   SpO2 98%   BMI 29.99 kg/m    General: Alert, no acute distress Cardio: Normal S1 and S2, RRR, no r/m/g Pulm: CTAB, normal work of breathing Abdomen: Bowel sounds normal. Abdomen soft and non-tender.  Extremities: No peripheral edema.   ASSESSMENT/PLAN:   Anemia Asymptomatic. No signs of bleeding.  Reviewed recent lab results. Hbg 9.3.  Ferritin 13. Iron 32.  Since these labs drawn patient has resumed iron supplements daily. -CBC today -Continue Ferrous sulfate 325 mg daily -Follow up in 3 months -Strict return precautions provided  Mass of colon S/p right colectomy.  Doing well.  -Follow up with Surgery for any surgical concerns.   HCM Declined Shingles vaccine PCV 20 today   [name], MD

## 2022-01-11 NOTE — Assessment & Plan Note (Signed)
S/p right colectomy.  Doing well.  -Follow up with Surgery for any surgical concerns.

## 2022-01-11 NOTE — Patient Instructions (Addendum)
It was a pleasure meeting you today. Thank you for allowing me to take part in your health care.  Our goals for today as we discussed include:  For your low iron We will get some labs today.  If they are abnormal or we need to do something about them, I will call you.  If they are normal, I will send you a message on MyChart (if it is active) or a letter in the mail.  If you don't hear from [place] in 2 weeks, please call the office at the number below.   Continue Iron supplements 325 mg, 1 tablet daily or if any issues with constipation can take 1 tablet every other day.   Follow up with your surgeon if you have any issues or concerns from your colon surgery.   Follow up with Cardiology, Dr [name], scheduled for Nov 9 at 1100 am  You have received the Pneumonia 20 vaccine today  Recommend Shingles vaccine.  This is a 1 dose series and can be given at your local pharmacy.  Please talk to your pharmacist about this.   Recommend RSV vaccine.  This can be given at your local pharmacy.  Please talk to your pharmacist about this.   Please follow-up with PCP in 2 months  If you have any questions or concerns, please do not hesitate to call the office at [phone].  I look forward to our next visit and until then take care and stay safe.  Regards,   [name], MD   [facility]

## 2022-02-02 ENCOUNTER — Ambulatory Visit: Payer: Medicare Other | Admitting: Cardiology

## 2022-02-09 ENCOUNTER — Encounter: Payer: Self-pay | Admitting: Cardiology

## 2022-02-09 ENCOUNTER — Ambulatory Visit: Payer: Medicare Other | Attending: Cardiology | Admitting: Cardiology

## 2022-02-09 VITALS — BP 140/80 | HR 80 | Ht 70.0 in | Wt 209.6 lb

## 2022-02-09 DIAGNOSIS — C189 Malignant neoplasm of colon, unspecified: Secondary | ICD-10-CM | POA: Insufficient documentation

## 2022-02-09 DIAGNOSIS — I25810 Atherosclerosis of coronary artery bypass graft(s) without angina pectoris: Secondary | ICD-10-CM | POA: Diagnosis not present

## 2022-02-09 DIAGNOSIS — T466X5A Adverse effect of antihyperlipidemic and antiarteriosclerotic drugs, initial encounter: Secondary | ICD-10-CM | POA: Insufficient documentation

## 2022-02-09 DIAGNOSIS — G4733 Obstructive sleep apnea (adult) (pediatric): Secondary | ICD-10-CM | POA: Insufficient documentation

## 2022-02-09 DIAGNOSIS — E785 Hyperlipidemia, unspecified: Secondary | ICD-10-CM | POA: Insufficient documentation

## 2022-02-09 DIAGNOSIS — K921 Melena: Secondary | ICD-10-CM | POA: Diagnosis not present

## 2022-02-09 DIAGNOSIS — M791 Myalgia, unspecified site: Secondary | ICD-10-CM | POA: Insufficient documentation

## 2022-02-09 DIAGNOSIS — T466X5D Adverse effect of antihyperlipidemic and antiarteriosclerotic drugs, subsequent encounter: Secondary | ICD-10-CM

## 2022-02-09 DIAGNOSIS — I2510 Atherosclerotic heart disease of native coronary artery without angina pectoris: Secondary | ICD-10-CM | POA: Diagnosis not present

## 2022-02-09 MED ORDER — ROSUVASTATIN CALCIUM 20 MG PO TABS: ORAL_TABLET | ORAL | Status: DC

## 2022-02-09 NOTE — Progress Notes (Signed)
Primary Care Provider: [name], MD;  Previously Seen by Waunita Schooner, MD [facility] Cardiologist: Glenetta Hew, MD  -> Phillip Royals, MD; [facility]   Electrophysiologist: None  Clinic Note: Chief Complaint  Patient presents with   Other    6 month f/u. Patient states that he is feeling wonderful.  Meds reviewed verbally with pt.   ===================================  ASSESSMENT/PLAN   Problem List Items Addressed This Visit       Cardiology Problems   Coronary artery disease involving native coronary artery of native heart without angina pectoris - Primary (Chronic)    Doing well.  Asymptomatic.  At this point he is not to take the aspirin and statin even if I try to put him on it.  I did ask him to try to take half a tablet of Crestor once a week. If he is able to take it 1 day a week then after 2 weeks try to increase to 2 days a week.  Every 2 weeks if tolerated increase it by another day a week.  His labs are not all that bad control and therefore I am not going to push too hard and a 35-year-old. As long as he is asymptomatic.  No further evaluation.  I do not think is any reason for [place] to go forward with any further ischemic testing since his graft was a LIMA to LAD, it is not like it is a vein graft that would have a high likelihood of closing down by this timeframe.  Would only test if he is having symptoms.      Relevant Medications   rosuvastatin (CRESTOR) 20 MG tablet   Other Relevant Orders   EKG 12-Lead (Completed)   Hyperlipidemia with target LDL less than 70 (Chronic)    LDL was not all that bad.  I wanted to try to begin him on anything so we tried to get him to take 10 mg 1 day a week of rosuvastatin, increase by 1 day a week every 2 weeks as tolerated.  At least maybe he can take 1 tablet a week.      Relevant Medications   rosuvastatin (CRESTOR) 20 MG tablet   Other Relevant Orders   EKG 12-Lead (Completed)     Other    Colon adenocarcinoma (HCC)   Relevant Orders   EKG 12-Lead (Completed)   OSA (obstructive sleep apnea) (Chronic)    Not using, normally plan to use his CPAP.      Relevant Orders   EKG 12-Lead (Completed)   Myalgia due to statin (Chronic)    Simply cannot tell any higher than 10 mg Crestor.  He is not interested in trying to look at other medicine such as PCSK9 meters.  He does not want to give himself a shot.  He understands that having higher lipids could put him at risk for developing worsening coronary disease.  He seems to be perfectly fine with that.      Relevant Orders   EKG 12-Lead (Completed)   GI bleed (Chronic)    History of GI bleed, therefore he is reluctant to take aspirin..  Again he has no stent as far as we know only single-vessel disease with a LIMA.  Therefore, at 44 if he does not want to take it he does not have to take it.     ===================================  HPI:    Phillip Nelson is a 87 y.o. male with a history of non-STEMI (one-vessel  CAD-CABGx1 July 2005), HTN, HLD, OSA (not diligent with CPAP) bilateral Carotid Stenosis, and recent colectomy for Colon Cancer who is being seen today as a 55-monthand post Nelson follow-up.  He returns at the request of Phillip Leitz MD   MWaylan Rocherwas previously [facility]  Last evaluation been with a echo and stress test in 2018 that were normal.  He had noted some exertional dyspnea and so a follow-up study set was ordered for 2022 and they never were done. In part because of Dr. KHeywood Footmanpotential impending retirement and a desire for change of scenery, the [facility].  I saw him back in April 2023 and we did decide to go and recheck an echo that showed normal EF and normal function.  I last saw him on May 11 to discuss results of echo.  He is still noticing off-and-on right-sided chest discomfort it  was given nagging type pain.  More musculoskeletal in nature.  He indicated been not feeling as well and is energetic.  Not as active as he had usually been.  Ambulate more dyspneic than he used to be.  Still ability wants to do.  Still doing yard work including lawnmowing, cleaning up leaves and sticks.  He also cooks and cleans in the house.  He really tries not overdo it, otherwise he may get short of breath.  No chest pain or pressure.  He used to play golf more frequently but does not play as often now simply because of lack of people to play with.  He gets too short of breath walking uphill but not unless he goes fast.  No PND orthopnea. I recommend that he try taking rosuvastatin/Crestor.  Also consider the possibility of beta-blocker but he did morning to make him feel fatigued.  He was seen on 10/05/2021 by HAlmyra Deforest PA for preop evaluation for right laparoscopic colectomy.  (Dr. PDahlia Nelson: He denied any exertional chest pain or worsening dyspnea.  Completely resolved atypical chest pain from May.  He never did start Crestor due to side effects concerns.  He had multiple side effects other statins.  He is only on 81 mg aspirin.  Denied any limitation of functionality.  [place] that he walks around picking up branches etc.  He even runs a lawn more.  Able to do more than 4 METS.  Able to actually achieve 8 METS.  Recommended proceeding directly to the OR without further evaluate.  Low risk..Phillip [facility] Recent Hospitalizations:  October 11, 2021--> Hand-Assisted Laparoscopic-Converted to Open Right Hemicolectomy. ->  Stapled ileocolostomy for right colon.  He did have a brief ileus and needed 1 week of TPN along with NG tube decompression.  He ended up recovering to the well.  Was ambulating pain controlled.  Soft abdomen.  He was doing well at his 1 month postoperative visit on 10/27/2021. Seen on 01/11/2022 by TCarollee Leitz MD to establish primary care.  Noted some iron dismissed the anemia.  I been  taking iron tablets.  No symptoms.  Reviewed  CV studies:    The following studies were reviewed today: (if available, images/films reviewed: From Epic Chart or Care Everywhere) No new studies  Interval History:   MJAYLEE LANTRYpresents today for his first posthospital follow-up with cardiology.  He is doing great.  He has his rosuvastatin listed as a allergy although he never took the  rosuvastatin.  He said it "made his muscles tighten" but he only took 1/2 tablet once.  Overall he is doing pretty well with no major cardiac issues.  Recovered well from his surgery, and is back now walking but not as robustly as he had been.  Still doing yard work.  He been somewhat scared to exercise since surgery.  Not going out to play golf.  He also had some shoulder pain that he started having after the surgery from lying on the table probably.  Hoping to start back exercising soon.  CV Review of Symptoms (Summary) Cardiovascular ROS: no chest pain or dyspnea on exertion positive for - generally less active-deconditioned -- bothered by Bilat Shoulder pain - not able to play golf + has been scared to exercise since surgery -> is hoping to start back next week - feels well enough ;. Leg pain with statin -- stopped taking. negative for - edema, irregular heartbeat, palpitations, paroxysmal nocturnal dyspnea, rapid heart rate, shortness of breath, or syncope/near syncope or TIA/amaurosis fugax.  REVIEWED OF SYSTEMS   Review of Systems  Constitutional:  Positive for malaise/fatigue (Less active.  Deconditioned.  Not exercising as much, but does note exertional dyspnea. - post-op from surgery has not gone back to exercise.).  HENT:  Negative for congestion.   Respiratory:  Negative for cough (post-nasal drip. Phillip [facility]) and hemoptysis.   Cardiovascular:  Negative for chest pain (barely notes) and leg swelling.  Gastrointestinal:  Negative for abdominal pain, blood in stool and melena.  Genitourinary:  Negative  for hematuria.  Musculoskeletal:  Positive for joint pain (Bilateral shoulders; knees are stiff). Negative for falls.  Neurological:  Negative for dizziness (Only with standing up too quickly or turning her NSAID past.  He has had a history of vertigo.), focal weakness and weakness.  Psychiatric/Behavioral:  Negative for depression and memory loss (Minimal if any.). The patient is not nervous/anxious and does not have insomnia.     I have reviewed and (if needed) personally updated the patient's problem list, medications, allergies, past medical and surgical history, social and family history.   PAST MEDICAL HISTORY   Past Medical History:  Diagnosis Date   Adenocarcinoma of prostate ([place]) 05/08/2012   a.) Gleason = 3+3 = 6; PSA = 8.26; volume = 57cc; b.) s/p brachytherapy   Adenocarcinoma of transverse colon ([place]) 08/24/2021   a.) Bx (+) for moderately differentiated adenocarcinoma   Aortic atherosclerosis (HCC)    Aural vertigo    AVM (arteriovenous malformation) of duodenum, acquired    Basal cell carcinoma 12/31/2006   L mid infraclavicular. Excised 02/27/2007, margins free.   Basal cell carcinoma 10/30/2016    R post shoulder. Nodular pattern.   Basal cell carcinoma 03/31/2020   L lat pretibial - ED&C    Coronary artery disease involving native coronary artery of native heart with other form of angina pectoris ([place]) 09/2003   a.) followed by [facility] Nehemiah [facility]); transitioned to [facility] [name]) 06/2021; b.) NSTEMI 09/2003 (Sx= heaviness in chest ~100lb brick, no SOB); LHC --> 95% oLAD with diffuse 50% stenosis -> referred to CVTS; c.) s/p CABG x 1 (LIMA-LAD) 10/19/2003; d.) Non-Ischemic Cardiolite 07/2016   Daily headache 7/74/1287   Diastolic dysfunction    a.) TTE 08/04/2014: EF 45%, mild global HK, mild LVH, mild MAC, mild TR/PR; b.) TTE 08/10/2016: EF 50%, mild LVH, mild MAC, mild MR/TR/PR; c.) TTE 07/29/2021: ED 55-60%, G1DD, Ao sclerosis with no stenosis.    Diverticulitis  DOE (dyspnea on exertion)    DOE (dyspnea on exertion) 07/12/2021   ED (erectile dysfunction)    Frequent headaches    Gout, arthritis    Hemorrhoids    HTN (hypertension)    Hyperlipidemia    Hypotension due to hypovolemia    IDA (iron deficiency anemia)    Myalgia due to statin    NSTEMI (non-ST elevated myocardial infarction) ([place]) 10/17/2003   a.) [place] 10/18/2003 --> 95% oLAD with multiple 50% sequential lesions --> referred to CVTS. b.) CABG x 1 (LIMA-LAD) 10/19/2003.   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 surgery.    BILATERAL CARPAL TUNNEL RELEASE Right 06/28/2001   Right (endoscopic)   CARPECTOMY Right    wrist   CATARACT EXTRACTION W/ INTRAOCULAR LENS  IMPLANT, BILATERAL     COLONOSCOPY WITH PROPOFOL N/A 10/31/2016   Procedure: COLONOSCOPY WITH PROPOFOL;  Surgeon: Christene Lye, MD;  Location: [facility] ENDOSCOPY;  Service: Endoscopy;  Laterality: N/A;   COLONOSCOPY WITH PROPOFOL N/A 08/24/2021   Procedure: COLONOSCOPY WITH PROPOFOL;  Surgeon: Lin Landsman, MD;  Location[facility] ENDOSCOPY;  Service: Gastroenterology;  Laterality: N/A;   CORONARY ARTERY BYPASS GRAFT  10/19/2003   Procedure: CORONARY ARTERY BYPASS GRAFT; Locaation: [place]; Surgeon:  Tobey Grim, IV, MD   DORSAL COMPARTMENT  RELEASE Left 06/25/2017   Procedure: LEFT WRIST TENDON SHEATH RELEASE;  Surgeon: Milly Jakob, MD;  Location: [place];  Service: Orthopedics;  Laterality: Left;   EMBOLIZATION N/A 11/21/2018   Procedure: EMBOLIZATION (Colonic);  Surgeon: Katha Cabal, MD;  Location: [place] [facility];  Service: Cardiovascular;  Laterality: N/A;   ESOPHAGOGASTRODUODENOSCOPY (EGD) WITH PROPOFOL N/A 08/24/2021   Procedure: ESOPHAGOGASTRODUODENOSCOPY (EGD) WITH PROPOFOL;  Surgeon: Lin Landsman, MD;  [facility];  Service: Gastroenterology;  Laterality: N/A;   FOOT SURGERY Bilateral    GOUT   KNEE ARTHROSCOPY Bilateral 1986   Right: 1986; left 09/06/2009   LAPAROSCOPIC RIGHT COLECTOMY N/A 10/11/2021   Procedure: LAPAROSCOPIC RIGHT COLECTOMY-RNFA to assist Converted to open procedure;  Surgeon: Jules Husbands, MD;  Location: [facility] ORS;  Service: General;  Laterality: N/A;  Provider is requesting 3 hours(180 minutes) for this case   LEFT ELBOW SURGERY  AGE 56  &  2007   LEFT HEART CATH AND CORONARY ANGIOGRAPHY  10/18/2003   [facility] (non-STEMI): Ostial LAD 95% with multiple sequential 50% stenoses. => Referred for CABG   NM GATED MYOCARDIAL STUDY (ARMX HX)  07/31/2016   Treadmill Myoview/Cardiolite : [facility]): 4.6 METS, 92 % MPHR.  EF 55 to 60%.  Normal wall motion.  No ischemia or infarction.   OLECRANON BURSA EXCISION Right 06/28/2001   OLECRANON BURSA EXCISION  Left 06/24/2007   RADIOACTIVE SEED IMPLANT N/A 08/02/2012   Procedure: RADIOACTIVE SEED IMPLANT;  Surgeon: Claybon Jabs, MD;  [facility];  Service: Urology;  Laterality: N/A;   TOTAL KNEE ARTHROPLASTY Bilateral 04/14/2004   RIGHT 2006; LEFT 11/21/2010   TRANSTHORACIC ECHOCARDIOGRAM  07/31/2016   ([facility]) normal LV function.  Mild LVH.  EF 50%.  Normal RV.  Mild MR, TR and PR.  No stenosis.   Cardiac cath 10/19/2003: [facility] (non-STEMI): Ostial LAD 95% with  multiple sequential 50% stenoses. => Referred for CABG Angina Sx was "heaviness in chest - like 100 lb block on his chest, no real SOB.  Occurred while playing Golf. NO Sx like that since  CABGx1 LIMA-LAD 10/19/2003: [facility], Tobey Grim, IV, MD   Echocardiogram 07/31/2016: [facility]) normal LV function.  Mild LVH.  EF 50%.  Normal RV.  Mild MR, TR and PR.  No stenosis. Treadmill Myoview/Cardiolite 07/31/2016: [facility]): 4.6 METS, 92 % MPHR.  EF 55 to 60%.  Normal wall motion.  No ischemia or infarction.  TTE 07/29/2021: EF 55 to 60%.  Normal wall motion.  GR 1 DD.  Normal RV size and function.  Unable to assess PAP, normal RAP.Phillip Nelson  Normal MV.  AOV sclerosis with no stenosis.   Immunization History  Administered Date(s) Administered   Fluad Quad(high Dose 65+) 12/24/2018, 12/13/2021   Influenza, High Dose Seasonal PF 02/02/2015, 01/10/2017, 01/10/2018, 01/14/2021   Influenza-Unspecified 01/02/2013, 01/08/2014, 02/02/2015   PFIZER(Purple Top)SARS-COV-2 Vaccination 04/17/2019, 05/08/2019, 12/29/2019, 07/16/2020   PNEUMOCOCCAL CONJUGATE-20 01/11/2022   Pneumococcal Conjugate-13 08/10/2010   Pneumococcal Polysaccharide-23 01/02/2003, 08/10/2014   Tdap 08/10/2014    MEDICATIONS/ALLERGIES   Current Meds  Medication Sig   allopurinol (ZYLOPRIM) 300 MG tablet Take 1 tablet (300 mg total) by mouth daily.  - he is reluctant to take meds -- stopped taking Beta Blocker & statin  He stopped taking aspirin as well as the iron supplements.  Allergies  Allergen Reactions   Indocin [Indomethacin] Other (See Comments)    Shakes. tremors   Iodinated Contrast Media Hives   Penicillins Itching, Rash and Other (See Comments)    Did it involve swelling of the face/tongue/throat, SOB, or low BP? Unknown Did it involve sudden or severe rash/hives, skin peeling, or any reaction on the inside of your mouth or nose? Unknown Did you need to seek medical attention at a [facility] or  doctor's office? Unknown When did it last happen? unknown If all above answers are "NO", may proceed with cephalosporin use.     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  He does all of the housework including cooking, cleaning, laundry, vacuuming and yard work.      Safety   Seat belts: Yes    Guns: Yes  and secure   Safe in relationships: Yes    Family History  Problem Relation Age of Onset   Cerebral aneurysm Mother    Alzheimer's disease Father    Lung cancer Sister 67       treated surgically    OBJCTIVE -PE, EKG, labs   Wt Readings from Last 3 Encounters:  02/09/22 209 lb 9.6 oz (95.1 kg)  01/11/22 209 lb (94.8 kg)  12/13/21 207 lb 12.8 oz (94.3 kg)    Physical Exam: BP (!) 140/80 (BP Location: Left Arm, Patient Position: Sitting, Cuff Size: Normal)   Pulse 80   Ht _0  (1.778 m)   Wt 209 lb 9.6 oz (95.1 kg)   SpO2 99%   BMI 30.07 kg/m  Physical Exam Vitals reviewed.  Constitutional:      General: He is not in acute distress.    Appearance: Normal appearance. He is obese. He is not ill-appearing (Well-nourished, well-groomed.) or toxic-appearing.     Comments: Well-nourished, well-groomed.  HENT:     Head: Normocephalic and  atraumatic.  Neck:     Vascular: No carotid bruit or JVD.  Cardiovascular:     Rate and Rhythm: Normal rate and regular rhythm. Occasional Extrasystoles (Not symptomatic) are present.    Chest Wall: PMI is not displaced.     Pulses: Normal pulses.     Heart sounds: Heart sounds are distant. No murmur heard.    No friction rub. No gallop.     Comments: Normal S1, split S2 Pulmonary:     Effort: Pulmonary effort is normal. No respiratory distress.     Breath sounds: Normal breath sounds. No wheezing or rales.  Chest:     Chest wall: No tenderness.  Abdominal:     Comments: Exam deferred  Musculoskeletal:        General: No swelling, tenderness or signs of injury. Normal range of motion.     Cervical back: Normal range of motion and neck supple.  Skin:    General: Skin is warm and dry.     Coloration: Skin is not jaundiced.  Neurological:     General: No focal deficit present.     Mental Status: He is alert and oriented to person, place, and time. Mental status is at baseline.     Cranial Nerves: No cranial nerve deficit.     Motor: No weakness.     Gait: Gait (A little bit slow but otherwise normal.) normal.  Psychiatric:        Mood and Affect: Mood normal.        Behavior: Behavior normal.        Thought Content: Thought content normal.        Judgment: Judgment normal.     Comments: Seems a little bit stubborn.  Lots of pushback about medications.      Adult ECG Report Not checked  Recent Labs: Reviewed Lab Results  Component Value Date   CHOL 171 12/16/2021   HDL 63 12/16/2021   LDLCALC 85 12/16/2021   TRIG 113 12/16/2021   CHOLHDL 2.7 12/16/2021    Lab Results  Component Value Date   CREATININE 0.89 10/20/2021   BUN  21 10/20/2021   NA 137 10/20/2021   K 4.3 10/20/2021   CL 106 10/20/2021   CO2 25 10/20/2021      Latest Ref Rng & Units 01/11/2022    9:03 AM 10/19/2021    4:15 AM 10/18/2021    5:07 AM  CBC  WBC 4.0 - 10.5 K/uL 11.4  13.1  12.3    Hemoglobin 13.0 - 17.0 g/dL 13.2  9.3  8.8   Hematocrit 39.0 - 52.0 % 41.0  29.5  27.2   Platelets 150.0 - 400.0 K/uL 346.0  419  368     Lab Results  Component Value Date   HGBA1C 6.3 07/12/2021   Lab Results  Component Value Date   TSH 0.986 11/20/2018    ==================================================  COVID-19 Education: The signs and symptoms of COVID-19 were discussed with the patient and how to seek care for testing (follow up with PCP or arrange E-visit).    I spent a total of 20 minutes with the patient spent in direct patient consultation.  Additional time spent with chart review  / charting (studies, outside notes, etc): 12 min Total Time: 32 min  Current medicines are reviewed at length with the patient today.  (+/- concerns) none  Notice: This dictation was prepared with Dragon dictation along with smart phrase technology. Any transcriptional errors that result from this process are unintentional and may not be corrected upon review.   Studies Ordered:  Orders Placed This Encounter  Procedures   EKG 12-Lead   Meds ordered this encounter  Medications   rosuvastatin (CRESTOR) 20 MG tablet    Sig: Take 0.5 tablet (10 mg) by mouth once a week as directed   Meds ordered this encounter  Medications   rosuvastatin (CRESTOR) 20 MG tablet    Sig: Take 0.5 tablet (10 mg) by mouth once a week as directed      Patient Instructions / Medication Changes & Studies & Tests Ordered   Dietary modification discussed avoid sugars and salt      Glenetta Hew, M.D., M.S. Interventional Cardiologist   Pager # [phone] Phone # [phone] [address] [place], [place] [address]   Thank you for choosing [facility] [place]!!

## 2022-02-09 NOTE — Patient Instructions (Signed)
Medication Instructions:  - Your physician has recommended you make the following change in your medication:   1) Crestor 20 mg: - take 0.5 tablet (10 mg) one day a week x 2 weeks, then - take 0.5 tablet (10 mg) two days a week x 2 weeks, then - take 0.5 tablet (10 mg) three days a week  *If you need a refill on your cardiac medications before your next appointment, please call your pharmacy*   Lab Work: - none ordered  If you have labs (blood work) drawn today and your tests are completely normal, you will receive your results only by: [place] (if you have MyChart) OR A paper copy in the mail If you have any lab test that is abnormal or we need to change your treatment, we will call you to review the results.   Testing/Procedures: - none ordered   Follow-Up: At [facility], you and your health needs are our priority.  As part of our continuing mission to provide you with exceptional heart care, we have created designated Provider Care Teams.  These Care Teams include your primary Cardiologist (physician) and Advanced Practice Providers (APPs -  Physician Assistants and Nurse Practitioners) who all work together to provide you with the care you need, when you need it.  We recommend signing up for the patient portal called "MyChart".  Sign up information is provided on this After Visit Summary.  MyChart is used to connect with patients for Virtual Visits (Telemedicine).  Patients are able to view lab/test results, encounter notes, upcoming appointments, etc.  Non-urgent messages can be sent to your provider as well.   To learn more about what you can do with MyChart, go to [email or web address].    Your next appointment:   1) 6 months with the PA/ NP   2) 1 year with Dr. [name]  The format for your next appointment:   In Person  Provider:   As above     Other Instructions N/a  Important Information About Sugar

## 2022-03-04 NOTE — Assessment & Plan Note (Addendum)
Simply cannot tell any higher than 10 mg Crestor.  He is not interested in trying to look at other medicine such as PCSK9 meters.  He does not want to give himself a shot.  He understands that having higher lipids could put him at risk for developing worsening coronary disease.  He seems to be perfectly fine with that.

## 2022-03-04 NOTE — Assessment & Plan Note (Signed)
Doing well.  Asymptomatic.  At this point he is not to take the aspirin and statin even if I try to put him on it.  I did ask him to try to take half a tablet of Crestor once a week. If he is able to take it 1 day a week then after 2 weeks try to increase to 2 days a week.  Every 2 weeks if tolerated increase it by another day a week.  His labs are not all that bad control and therefore I am not going to push too hard and a 87-year-old. As long as he is asymptomatic.  No further evaluation.  I do not think is any reason for [place] to go forward with any further ischemic testing since his graft was a LIMA to LAD, it is not like it is a vein graft that would have a high likelihood of closing down by this timeframe.  Would only test if he is having symptoms.

## 2022-03-04 NOTE — Assessment & Plan Note (Addendum)
Not using, normally plan to use his CPAP.

## 2022-03-04 NOTE — Assessment & Plan Note (Signed)
LDL was not all that bad.  I wanted to try to begin him on anything so we tried to get him to take 10 mg 1 day a week of rosuvastatin, increase by 1 day a week every 2 weeks as tolerated.  At least maybe he can take 1 tablet a week.

## 2022-03-04 NOTE — Assessment & Plan Note (Signed)
History of GI bleed, therefore he is reluctant to take aspirin..  Again he has no stent as far as we know only single-vessel disease with a LIMA.  Therefore, at 87 if he does not want to take it he does not have to take it.

## 2022-03-08 ENCOUNTER — Telehealth: Payer: Self-pay | Admitting: Family Medicine

## 2022-03-08 ENCOUNTER — Encounter: Payer: Self-pay | Admitting: *Deleted

## 2022-03-08 ENCOUNTER — Other Ambulatory Visit: Payer: Self-pay

## 2022-03-08 ENCOUNTER — Emergency Department
Admission: EM | Admit: 2022-03-08 | Discharge: 2022-03-08 | Disposition: A | Discharge status: Home / Self Care | Payer: Medicare Other | Attending: Emergency Medicine | Admitting: Emergency Medicine

## 2022-03-08 DIAGNOSIS — K625 Hemorrhage of anus and rectum: Secondary | ICD-10-CM | POA: Diagnosis not present

## 2022-03-08 LAB — CBC
HCT: 43.7 % (ref 39.0–52.0)
Hemoglobin: 14.2 g/dL (ref 13.0–17.0)
MCH: 29.3 pg (ref 26.0–34.0)
MCHC: 32.5 g/dL (ref 30.0–36.0)
MCV: 90.3 fL (ref 80.0–100.0)
Platelets: 280 10*3/uL (ref 150–400)
RBC: 4.84 MIL/uL (ref 4.22–5.81)
RDW: 19.2 % — ABNORMAL HIGH (ref 11.5–15.5)
WBC: 11.2 10*3/uL — ABNORMAL HIGH (ref 4.0–10.5)
nRBC: 0 % (ref 0.0–0.2)

## 2022-03-08 LAB — COMPREHENSIVE METABOLIC PANEL
ALT: 30 U/L (ref 0–44)
AST: 30 U/L (ref 15–41)
Albumin: 4.1 g/dL (ref 3.5–5.0)
Alkaline Phosphatase: 64 U/L (ref 38–126)
Anion gap: 8 (ref 5–15)
BUN: 21 mg/dL (ref 8–23)
CO2: 23 mmol/L (ref 22–32)
Calcium: 9 mg/dL (ref 8.9–10.3)
Chloride: 107 mmol/L (ref 98–111)
Creatinine, Ser: 0.97 mg/dL (ref 0.61–1.24)
GFR, Estimated: >60 mL/min (ref >60)
Glucose, Bld: 90 mg/dL (ref 70–99)
Potassium: 4.1 mmol/L (ref 3.5–5.1)
Sodium: 138 mmol/L (ref 135–145)
Total Bilirubin: 1.1 mg/dL (ref 0.3–1.2)
Total Protein: 7.7 g/dL (ref 6.5–8.1)

## 2022-03-08 NOTE — ED Provider Notes (Signed)
[facility] Provider Note    Event Date/Time   First MD Initiated Contact with Patient 03/08/22 2005     (approximate)   History   Rectal Bleeding   HPI  Phillip Nelson is a 87 y.o. male presents to the emergency department with rectal bleeding.  Endorses 2 days of dark red blood with his stool.  States that he has been taking iron supplementation and had black stools but today and yesterday it was more of a dark red.  Does state that he has had colon surgery in July this past year.  Not on anticoagulation.  Denies any abdominal pain at this time.      Physical Exam   Triage Vital Signs: ED Triage Vitals  Enc Vitals Group     BP 03/08/22 1844 (!) 158/86     Pulse Rate 03/08/22 1844 84     Resp 03/08/22 1844 20     Temp 03/08/22 1844 98.3 F (36.8 C)     Temp Source 03/08/22 1844 Oral     SpO2 03/08/22 1844 95 %     Weight 03/08/22 1840 200 lb (90.7 kg)     Height 03/08/22 1840 '5\' 10"'$  (1.778 m)     Head Circumference --      Peak Flow --      Pain Score 03/08/22 1840 0     Pain Loc --      Pain Edu? --      Excl. in [place]? --     Most recent vital signs: Vitals:   03/08/22 2015 03/08/22 2112  BP: (!) 126/54 127/70  Pulse: 79 76  Resp: 18 16  Temp:  98 F (36.7 C)  SpO2: 99% 100%    Physical Exam Constitutional:      Appearance: He is well-developed.  HENT:     Head: Atraumatic.  Eyes:     Conjunctiva/sclera: Conjunctivae normal.  Cardiovascular:     Rate and Rhythm: Regular rhythm.  Pulmonary:     Effort: No respiratory distress.  Genitourinary:    Rectum: Guaiac result positive.     Comments: Rectal exam with dark red blood mixed with stool.  No external hemorrhoids.  No palpable mass. Musculoskeletal:     Cervical back: Normal range of motion.  Skin:    General: Skin is warm.  Neurological:     Mental Status: He is alert. Mental status is at baseline.     IMPRESSION / MDM / ASSESSMENT AND PLAN / ED COURSE  I  reviewed the triage vital signs and the nursing notes.  Differential diagnosis including upper GI bleed, AVM, diverticulosis, lower GI bleed, malignancy    LABS (all labs ordered are listed, but only abnormal results are displayed) Labs interpreted as -  Mild leukocytosis but no signs of an infection.  Hemoglobin is 14 and higher than his prior.  Labs Reviewed  CBC - Abnormal; Notable for the following components:      Result Value   WBC 11.2 (*)    RDW 19.2 (*)    All other components within normal limits  COMPREHENSIVE METABOLIC PANEL   Based on Blatchford score no concern for significant upper GI bleed.  Not on anticoagulation.  Hemodynamically stable.  Does have gross blood on my exam.  Patient states that he will follow-up as an outpatient with his primary care physician and follow-up with GI for likely need of colonoscopy.  Given return precautions. TREATMENT  PROCEDURES:  Critical Care performed: No  Procedures  Patient's presentation is most consistent with acute presentation with potential threat to life or bodily function.   MEDICATIONS ORDERED IN ED: Medications - No data to display  FINAL CLINICAL IMPRESSION(S) / ED DIAGNOSES   Final diagnoses:  Rectal bleeding     Rx / DC Orders   ED Discharge Orders     None        Note:  This document was prepared using Dragon voice recognition software and may include unintentional dictation errors.   Nathaniel Man, MD 03/09/22 0030

## 2022-03-08 NOTE — ED Triage Notes (Signed)
Pt has rectal bleeding for 2 days.  No abd pain.  Hx colon surgery, rectal bleeding.  No dizziness.  Pt alert  speech clear.

## 2022-03-08 NOTE — Discharge Instructions (Addendum)
You were seen in the emergency department for rectal bleeding.  Your hemoglobin level was 14.2.  It is importantly follow-up closely with your primary care physician so they can recheck your hemoglobin level next week.  You need to follow-up with GI so they can talk about a colonoscopy and figure out where your bleeding is coming from.  Return to the emergency department for any symptoms of anemia or worsening bleeding.

## 2022-03-08 NOTE — Telephone Encounter (Signed)
Pt called stating he is bleeding alot out of his rectum. Pt stated it is dark blood. Sent to access nurse

## 2022-03-09 ENCOUNTER — Ambulatory Visit: Payer: Medicare Other | Admitting: Cardiology

## 2022-03-09 ENCOUNTER — Ambulatory Visit (INDEPENDENT_AMBULATORY_CARE_PROVIDER_SITE_OTHER): Payer: Medicare Other | Admitting: Gastroenterology

## 2022-03-09 ENCOUNTER — Encounter: Payer: Self-pay | Admitting: Gastroenterology

## 2022-03-09 VITALS — BP 140/77 | HR 86 | Wt 212.4 lb

## 2022-03-09 DIAGNOSIS — K625 Hemorrhage of anus and rectum: Secondary | ICD-10-CM | POA: Diagnosis not present

## 2022-03-09 DIAGNOSIS — K921 Melena: Secondary | ICD-10-CM | POA: Diagnosis not present

## 2022-03-09 MED ORDER — HYDROCORTISONE (PERIANAL) 2.5 % EX CREA: 1.0000 | TOPICAL_CREAM | Freq: Two times a day (BID) | CUTANEOUS | 1 refills | Status: DC

## 2022-03-09 NOTE — Telephone Encounter (Signed)
Patient was sent to access nurse and has also seen GI for his issue. Velton Roselle,cma

## 2022-03-09 NOTE — Progress Notes (Signed)
Phillip Darby, MD [address]  [place]  [place], [place] [address]  Main: [phone]  Fax: [phone]    Gastroenterology Consultation  Referring Provider:     [name], MD Primary Care Physician:  [name], MD Primary Gastroenterologist:  Dr. Cephas Nelson Reason for Consultation: History of colon cancer        HPI:   Phillip Nelson is a 87 y.o. male referred by Dr. [name], MD  for consultation & management of iron deficiency anemia.  Patient initially presented to his PCP in April 2023 secondary to exertional dyspnea as well as chest pain that he was experiencing for 6 months.  He was found to have new onset of iron deficiency anemia, hemoglobin 10.3, MCV 82.1, ferritin 10.4.  He was started on oral iron, underwent FOBT which came back positive.  He was also evaluated by cardiology, echocardiogram was unremarkable.  Patient had a follow-up with his cardiologist, Dr. [name] who cleared him to undergo endoscopy evaluation.  Patient was supposed to take oral iron but his PCPs recommendations, he stopped taking it after a few days when he noticed that his stools were turning.  Patient denies any chest pain, shortness of breath.  He denies any rectal bleeding, melena  Patient does have history of diverticular bleed s/p embolization in the past  Follow-up visit 03/09/2022 Patient walked into our office today because of rectal bleeding.  He went to ER yesterday because of 3 days of painless bright red blood per rectum on wiping and on the surface of the stool.  He is diagnosed with adenocarcinoma of the transverse colon after undergoing colonoscopy and endoscopy for iron deficiency anemia, s/p right hemicolectomy with primary anastomosis in July 2023.  Patient developed postop ileus and he recovered well.  He also required TPN.  He is an deficiency anemia has currently resolved.  He is not on any neoadjuvant chemo.  He has been doing well until 3 days ago when he  noticed bright red blood per rectum.  He denies any abdominal pain, constipation, straining, change in bowel habits, weight loss or loss of appetite, abdominal distention.  He reports having similar episode long time ago.  He got worried if he has diverticular bleed and walked into the office today.  His hemoglobin in the ER was 14.2.  Normal BUN/creatinine.  NSAIDs: None  Antiplts/Anticoagulants/Anti thrombotics: Aspirin 81 mg  GI Procedures:  EGD and colonoscopy 08/24/2021  - Two non-bleeding angioectasias in the duodenum. Treated with argon plasma coagulation (APC). - Normal stomach. Biopsied. - Normal gastroesophageal junction and esophagus.  - Likely malignant tumor in the transverse colon. Biopsied. Tattooed. - One 5 mm polyp in the transverse colon, removed with a cold snare. Resected and retrieved. - Diverticulosis in the recto-sigmoid colon and in the sigmoid colon. - Non-bleeding external hemorrhoids.  DIAGNOSIS:  A. STOMACH; COLD BIOPSY:  - UNREMARKABLE GASTRIC MUCOSA.  - NEGATIVE FOR H. PYLORI, INTESTINAL METAPLASIA, DYSPLASIA, AND  MALIGNANCY.   B. COLON MASS, TRANSVERSE; COLD BIOPSY:  - INVASIVE COLORECTAL ADENOCARCINOMA, MODERATELY DIFFERENTIATED.   C.  COLON POLYP, TRANSVERSE; COLD SNARE:  - TUBULAR ADENOMA.  - FRAGMENTS OF COLONIC MUCOSA WITH INTRAMUCOSAL ADENOCARCINOMA,  MORPHOLOGICALLY SIMILAR TO SPECIMEN B, SEE COMMENT.   Comment:  Clinical and endoscopic correlation are required to determine whether  the fragments of intramucosal adenocarcinoma seen in the transverse  colon polyp specimen could be a contaminant from the transverse colon  mass biopsy.   Colonoscopy  in 2018 - Diverticulosis in the sigmoid colon and in the descending colon. - One 5 mm polyp in the rectum, removed with a hot snare. Resected and retrieved. - One 3 mm polyp in the ascending colon, removed with a cold biopsy forceps. Resected and retrieved. - The examination was otherwise  normal. DIAGNOSIS:  A. RECTUM POLYP; HOT SNARE:  - HYPERPLASTIC POLYP.  - NEGATIVE FOR DYSPLASIA AND MALIGNANCY.   B. COLON POLYP, ASCENDING; COLD BIOPSY:  - TUBULAR ADENOMA.  - NEGATIVE FOR HIGH-GRADE DYSPLASIA AND MALIGNANCY.   Past Medical History:  Diagnosis Date   Adenocarcinoma of prostate ([place]) 05/08/2012   a.) Gleason = 3+3 = 6; PSA = 8.26; volume = 57cc; b.) s/p brachytherapy   Adenocarcinoma of transverse colon ([place]) 08/24/2021   a.) Bx (+) for moderately differentiated adenocarcinoma   Aortic atherosclerosis ([facility])    Aural vertigo    AVM (arteriovenous malformation) of duodenum, acquired    Basal cell carcinoma 12/31/2006   L mid infraclavicular. Excised 02/27/2007, margins free.   Basal cell carcinoma 10/30/2016    R post shoulder. Nodular pattern.   Basal cell carcinoma 03/31/2020   L lat pretibial - ED&C    Coronary artery disease involving native coronary artery of native heart with other form of angina pectoris ([place]) 09/2003   a.) followed by [facility] Nehemiah Massed); transitioned to [facility] [name]) 06/2021; b.) NSTEMI 09/2003 (Sx= heaviness in chest ~100lb brick, no SOB); LHC --> 95% oLAD with diffuse 50% stenosis -> referred to CVTS; c.) s/p CABG x 1 (LIMA-LAD) 10/19/2003; d.) Non-Ischemic Cardiolite 07/2016   Daily headache 11/27/4095   Diastolic dysfunction    a.) TTE 08/04/2014: EF 45%, mild global HK, mild LVH, mild MAC, mild TR/PR; b.) TTE 08/10/2016: EF 50%, mild LVH, mild MAC, mild MR/TR/PR; c.) TTE 07/29/2021: ED 55-60%, G1DD, Ao sclerosis with no stenosis.   Diverticulitis    DOE (dyspnea on exertion)    DOE (dyspnea on exertion) 07/12/2021   ED (erectile dysfunction)    Frequent headaches    Gout, arthritis    Hemorrhoids    HTN (hypertension)    Hyperlipidemia    Hypotension due to hypovolemia    IDA (iron deficiency anemia)    Myalgia due to statin    NSTEMI (non-ST elevated myocardial infarction) ([place]) 10/17/2003   a.) LHC 10/18/2003 -->  95% oLAD with multiple 50% sequential lesions --> referred to CVTS. b.) CABG x 1 (LIMA-LAD) 10/19/2003.   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 surgery.    BILATERAL CARPAL TUNNEL RELEASE Right 06/28/2001   Right (endoscopic)   CARPECTOMY Right    wrist   CATARACT EXTRACTION W/ INTRAOCULAR LENS  IMPLANT, BILATERAL     COLONOSCOPY WITH PROPOFOL N/A 10/31/2016  Procedure: COLONOSCOPY WITH PROPOFOL;  Surgeon: Christene Lye, MD;  Location: [facility] ENDOSCOPY;  Service: Endoscopy;  Laterality: N/A;   COLONOSCOPY WITH PROPOFOL N/A 08/24/2021   Procedure: COLONOSCOPY WITH PROPOFOL;  Surgeon: Lin Landsman, MD;  Location: [facility] ENDOSCOPY;  Service: Gastroenterology;  Laterality: N/A;   CORONARY ARTERY BYPASS GRAFT  10/19/2003   Procedure: CORONARY ARTERY BYPASS GRAFT; Locaation: [place]; Surgeon:  Tobey Grim, IV, MD   DORSAL COMPARTMENT RELEASE Left 06/25/2017   Procedure: LEFT WRIST TENDON SHEATH RELEASE;  Surgeon: Milly Jakob, MD;  Location: [place];  Service: Orthopedics;  Laterality: Left;   EMBOLIZATION N/A 11/21/2018   Procedure: EMBOLIZATION (Colonic);  Surgeon: Katha Cabal, MD;  Location: [facility] CV LAB;  Service: Cardiovascular;  Laterality: N/A;   ESOPHAGOGASTRODUODENOSCOPY (EGD) WITH PROPOFOL N/A 08/24/2021   Procedure:  ESOPHAGOGASTRODUODENOSCOPY (EGD) WITH PROPOFOL;  Surgeon: Lin Landsman, MD;  Location: [facility] ENDOSCOPY;  Service: Gastroenterology;  Laterality: N/A;   FOOT SURGERY Bilateral    GOUT   KNEE ARTHROSCOPY Bilateral 1986   Right: 1986; left 09/06/2009   LAPAROSCOPIC RIGHT COLECTOMY N/A 10/11/2021   Procedure: LAPAROSCOPIC RIGHT COLECTOMY-RNFA to assist Converted to open procedure;  Surgeon: Jules Husbands, MD;  Location: [facility] ORS;  Service: General;  Laterality: N/A;  Provider is requesting 3 hours(180 minutes) for this case   LEFT ELBOW SURGERY  AGE 87  &  2007   LEFT HEART CATH AND CORONARY ANGIOGRAPHY  10/18/2003   [facility] (non-STEMI): Ostial LAD 95% with multiple sequential 50% stenoses. => Referred for CABG   NM GATED MYOCARDIAL STUDY (ARMX HX)  07/31/2016   Treadmill Myoview/Cardiolite : [facility]): 4.6 METS, 92 % MPHR.  EF 55 to 60%.  Normal wall motion.  No ischemia or infarction.   OLECRANON BURSA EXCISION Right 06/28/2001   OLECRANON BURSA EXCISION Left 06/24/2007   RADIOACTIVE SEED IMPLANT N/A 08/02/2012   Procedure: RADIOACTIVE SEED IMPLANT;  Surgeon: Claybon Jabs, MD;  Location: [facility];  Service: Urology;  Laterality: N/A;   TOTAL KNEE ARTHROPLASTY Bilateral 04/14/2004   RIGHT 2006; LEFT 11/21/2010   TRANSTHORACIC ECHOCARDIOGRAM  07/31/2016   ([facility]) normal LV function.  Mild LVH.  EF 50%.  Normal RV.  Mild MR, TR and PR.  No stenosis.    Current Outpatient Medications:    hydrocortisone (ANUSOL-HC) 2.5 % rectal cream, Place 1 Application rectally 2 (two) times daily., Disp: 30 g, Rfl: 1   allopurinol (ZYLOPRIM) 300 MG tablet, Take 1 tablet (300 mg total) by mouth daily., Disp: 90 tablet, Rfl: 3   aspirin EC 81 MG tablet, Take 81 mg by mouth as needed. (Patient not taking: Reported on 01/11/2022), Disp: , Rfl:    Iron, Ferrous Sulfate, 325 (65 Fe) MG TABS, Take 325 mg by mouth daily. (Patient not taking: Reported on 02/09/2022),  Disp: 90 tablet, Rfl: 3   nitroGLYCERIN (NITROSTAT) 0.4 MG SL tablet, Place 1 tablet (0.4 mg total) under the tongue every 5 (five) minutes as needed for chest pain. (Patient not taking: Reported on 01/11/2022), Disp: 30 tablet, Rfl: 0   rosuvastatin (CRESTOR) 20 MG tablet, Take 0.5 tablet (10 mg) by mouth once a week as directed, Disp: , Rfl:    Family History  Problem Relation Age of Onset   Cerebral aneurysm Mother    Alzheimer's disease Father    Lung cancer Sister 61       treated surgically     Social History   Tobacco  Use   Smoking status: Former    Packs/day: 3.00    Years: 31.00    Total pack years: 93.00    Types: Cigarettes    Quit date: 08/01/1987    Years since quitting: 34.6   Smokeless tobacco: Never  Vaping Use   Vaping Use: Never used  Substance Use Topics   Alcohol use: Not Currently    Comment: once a year on NYE   Drug use: No    Allergies as of 03/09/2022 - Review Complete 03/09/2022  Allergen Reaction Noted   Indocin [indomethacin] Other (See Comments) 06/05/2012   Iodinated contrast media Hives 10/19/2021   Penicillins Itching, Rash, and Other (See Comments) 06/05/2012    Review of Systems:    All systems reviewed and negative except where noted in HPI.   Physical Exam:  BP (!) 140/77 (BP Location: Right Arm, Patient Position: Sitting, Cuff Size: Normal)   Pulse 86   Wt 212 lb 6.4 oz (96.3 kg)   BMI 30.48 kg/m  No LMP for male patient.  General:   Alert,  Well-developed, well-nourished, pleasant and cooperative in NAD Head:  Normocephalic and atraumatic. Eyes:  Sclera clear, no icterus.   Conjunctiva pink. Ears:  Normal auditory acuity. Nose:  No deformity, discharge, or lesions. Mouth:  No deformity or lesions,oropharynx pink & moist. Neck:  Supple; no masses or thyromegaly. Lungs:  Respirations even and unlabored.  Clear throughout to auscultation.   No wheezes, crackles, or rhonchi. No acute distress. Heart:  Regular rate and rhythm;  no murmurs, clicks, rubs, or gallops. Abdomen:  Normal bowel sounds. Soft, non-tender and non-distended without masses, hepatosplenomegaly or hernias noted.  No guarding or rebound tenderness.   Rectal: Not performed Msk:  Symmetrical without gross deformities. Good, equal movement & strength bilaterally. Pulses:  Normal pulses noted. Extremities:  No clubbing or edema.  No cyanosis. Neurologic:  Alert and oriented x3;  grossly normal neurologically. Skin:  Intact without significant lesions or rashes. No jaundice. Psych:  Alert and cooperative. Normal mood and affect.  Imaging Studies: Reviewed  Assessment and Plan:   Phillip Nelson is a 87 y.o. male with history of hypertension, hyperlipidemia, history of CABG, history of iron deficiency anemia secondary to small bowel AVMs and adenocarcinoma of the transverse colon s/p APC of the small bowel AVMs in July 2023, right hemicolectomy in July 2023 with primary anastomosis.  Patient has been doing well and his iron deficiency anemia has resolved.  He now presents with 3 days of painless scant amount of bright red blood per rectum.  Hemoglobin is normal at 14.2 yesterday in the ER.  Reassured patient that I am not concerned about diverticular bleed.  He is not on any anticoagulants.  Most likely outlet bleeding from hemorrhoids.  Will try Anusol cream twice daily per rectum and he will call us back on Monday if his rectal bleeding persists.  Discussed with him about scheduling colonoscopy if needed sooner than waiting for 1 year since his previous colonoscopy  Patient expressed understanding of the plan and he appreciated being seen today urgently   Follow up as needed   Phillip Darby, MD

## 2022-03-10 ENCOUNTER — Telehealth: Payer: Self-pay

## 2022-03-10 ENCOUNTER — Other Ambulatory Visit: Payer: Self-pay

## 2022-03-10 ENCOUNTER — Other Ambulatory Visit
Admission: RE | Admit: 2022-03-10 | Discharge: 2022-03-10 | Disposition: A | Discharge status: Home / Self Care | Payer: Medicare Other | Source: Ambulatory Visit | Attending: Gastroenterology | Admitting: Gastroenterology

## 2022-03-10 DIAGNOSIS — K921 Melena: Secondary | ICD-10-CM | POA: Diagnosis not present

## 2022-03-10 DIAGNOSIS — Z86010 Personal history of colonic polyps: Secondary | ICD-10-CM

## 2022-03-10 LAB — CBC
HCT: 39.9 % (ref 39.0–52.0)
Hemoglobin: 13.1 g/dL (ref 13.0–17.0)
MCH: 29.8 pg (ref 26.0–34.0)
MCHC: 32.8 g/dL (ref 30.0–36.0)
MCV: 90.7 fL (ref 80.0–100.0)
Platelets: 294 10*3/uL (ref 150–400)
RBC: 4.4 MIL/uL (ref 4.22–5.81)
RDW: 18.9 % — ABNORMAL HIGH (ref 11.5–15.5)
WBC: 12.7 10*3/uL — ABNORMAL HIGH (ref 4.0–10.5)
nRBC: 0 % (ref 0.0–0.2)

## 2022-03-10 MED ORDER — CLENPIQ 10-3.5-12 MG-GM -GM/175ML PO SOLN: 175.0000 mL | Freq: Once | ORAL | 0 refills | Status: AC

## 2022-03-10 NOTE — Telephone Encounter (Signed)
Patient states he still having rectal bleeding with bowel movements. He states that he does not think he has hemorrhoids. Patient is wanting to know what you recommend

## 2022-03-10 NOTE — Telephone Encounter (Signed)
Informed patient of instructions. He [place] [facility]. Scheduled colonoscopy for 03/15/2022, went over instructions sent prep to the pharmacy. Patient is going to pick up copy of instructions at our office today

## 2022-03-13 ENCOUNTER — Telehealth: Payer: Self-pay | Admitting: Gastroenterology

## 2022-03-13 NOTE — Telephone Encounter (Signed)
Patient states he has had 3 clear bowel movements and does not think he needs a colonoscopy now. He states he will call back when he is ready to schedule the procedure. Called ENDO and talk to [facility] and got procedure canceled

## 2022-03-13 NOTE — Telephone Encounter (Signed)
Patient would like to cancel and reschedule colonoscopy.

## 2022-03-13 NOTE — Telephone Encounter (Signed)
Called and left a message for call back  

## 2022-03-15 ENCOUNTER — Encounter: Admission: RE | Payer: Self-pay | Source: Home / Self Care

## 2022-03-15 ENCOUNTER — Ambulatory Visit: Admission: RE | Admit: 2022-03-15 | Payer: Medicare Other | Source: Home / Self Care | Admitting: Gastroenterology

## 2022-03-15 SURGERY — COLONOSCOPY WITH PROPOFOL
Anesthesia: General

## 2022-04-03 DIAGNOSIS — L578 Other skin changes due to chronic exposure to nonionizing radiation: Secondary | ICD-10-CM | POA: Diagnosis not present

## 2022-04-03 DIAGNOSIS — L218 Other seborrheic dermatitis: Secondary | ICD-10-CM | POA: Diagnosis not present

## 2022-04-03 DIAGNOSIS — L820 Inflamed seborrheic keratosis: Secondary | ICD-10-CM | POA: Diagnosis not present

## 2022-04-03 DIAGNOSIS — Z85828 Personal history of other malignant neoplasm of skin: Secondary | ICD-10-CM | POA: Diagnosis not present

## 2022-04-03 DIAGNOSIS — L821 Other seborrheic keratosis: Secondary | ICD-10-CM | POA: Diagnosis not present

## 2022-04-03 DIAGNOSIS — L570 Actinic keratosis: Secondary | ICD-10-CM | POA: Diagnosis not present

## 2022-05-09 ENCOUNTER — Encounter: Payer: Self-pay | Admitting: Family Medicine

## 2022-05-09 ENCOUNTER — Ambulatory Visit (INDEPENDENT_AMBULATORY_CARE_PROVIDER_SITE_OTHER): Payer: Medicare Other | Admitting: Family Medicine

## 2022-05-09 VITALS — BP 126/72 | HR 79 | Temp 97.8°F | Ht 70.0 in

## 2022-05-09 DIAGNOSIS — T466X5A Adverse effect of antihyperlipidemic and antiarteriosclerotic drugs, initial encounter: Secondary | ICD-10-CM

## 2022-05-09 DIAGNOSIS — M791 Myalgia, unspecified site: Secondary | ICD-10-CM | POA: Diagnosis not present

## 2022-05-09 DIAGNOSIS — Z87898 Personal history of other specified conditions: Secondary | ICD-10-CM

## 2022-05-09 NOTE — Patient Instructions (Addendum)
It was a pleasure meeting you today. Thank you for allowing me to take part in your health care.  Our goals for today as we discussed include:  Glad your diarrhea has resolved  Ok to take Loperamide 2 mg for diarrhea.  Can take up to 4 tablets a day  Recommend high fiber diet and probiotics daily  Schedule Medicare Annual Wellness Appointment  Recommend Shingles vaccine.  This is a 2 dose series and can be given at your local pharmacy.  Please talk to your pharmacist about this.   Schedule annual visit with PCP in 1 month.  Will obtain labs at next visit  If you have any questions or concerns, please do not hesitate to call the office at [phone].  I look forward to our next visit and until then take care and stay safe.  Regards,   [name], MD   [facility]

## 2022-05-09 NOTE — Progress Notes (Signed)
   SUBJECTIVE:   Chief Complaint  Patient presents with   Acute Visit    Discuss Loperamide medication   HPI Patient presents to clinic to discuss loperamide.  Patient reports she had diarrhea couple months ago.  Episodes occurred 3-4 times daily.  Denies any bloody stool, abdominal pain or fevers.  He reports no diarrheal episodes for the last 2 weeks.  His friend told him about loperamide and was wondering if it was okay to take the medication.  Currently asymptomatic and hemodynamically stable.  PERTINENT PMH / PSH: Colon adenocarcinoma CAD   OBJECTIVE:  BP 126/72   Pulse 79   Temp 97.8 F (36.6 C)   Ht 5' 10"$  (1.778 m)   SpO2 98%   BMI 30.48 kg/m    Physical Exam Vitals reviewed.  Constitutional:      General: He is not in acute distress.    Appearance: Normal appearance. He is not ill-appearing, toxic-appearing or diaphoretic.  Eyes:     General:        Right eye: No discharge.        Left eye: No discharge.  Cardiovascular:     Rate and Rhythm: Normal rate.  Pulmonary:     Effort: Pulmonary effort is normal.  Abdominal:     General: Bowel sounds are normal.     Tenderness: There is no abdominal tenderness. There is no guarding.  Musculoskeletal:     Cervical back: Normal range of motion.  Skin:    General: Skin is warm and dry.  Neurological:     Mental Status: He is alert and oriented to person, place, and time. Mental status is at baseline.  Psychiatric:        Mood and Affect: Mood normal.        Behavior: Behavior normal.        Thought Content: Thought content normal.        Judgment: Judgment normal.     ASSESSMENT/PLAN:  Myalgia due to statin Assessment & Plan: Patient reports not taking medication.  Stopped about 6 weeks ago.  Increases muscle pain.  Recommend he continue Crestor 10 mg has recommended by cardiology given history of CAD status post MI, carotid artery stenosis  Patient not interested in continuing statin therapy   History  of diarrhea Assessment & Plan: Episodes of diarrhea few months ago.  Has now resolved. Okay to take loperamide 2 mg, max dose 8 mg daily Educated that if any fevers, abdominal pain, bloody stool, antibiotic use with diarrheal episodes need to be evaluated prior to taking loperamide. Recommend probiotics daily     PDMP reviewed  Return in about 4 weeks (around 06/06/2022) for PCP.  Blood work  [name], MD

## 2022-05-12 ENCOUNTER — Encounter: Payer: Self-pay | Admitting: Family Medicine

## 2022-05-12 DIAGNOSIS — Z87898 Personal history of other specified conditions: Secondary | ICD-10-CM | POA: Insufficient documentation

## 2022-05-12 NOTE — Assessment & Plan Note (Signed)
Episodes of diarrhea few months ago.  Has now resolved. Okay to take loperamide 2 mg, max dose 8 mg daily Educated that if any fevers, abdominal pain, bloody stool, antibiotic use with diarrheal episodes need to be evaluated prior to taking loperamide. Recommend probiotics daily

## 2022-05-12 NOTE — Assessment & Plan Note (Signed)
Patient reports not taking medication.  Stopped about 6 weeks ago.  Increases muscle pain.  Recommend he continue Crestor 10 mg has recommended by cardiology given history of CAD status post MI, carotid artery stenosis  Patient not interested in continuing statin therapy

## 2022-06-11 NOTE — Progress Notes (Signed)
   SUBJECTIVE:  No chief complaint on file.  HPI ***  PERTINENT PMH / PSH: ***  OBJECTIVE:  There were no vitals taken for this visit.   Physical Exam  ASSESSMENT/PLAN:  There are no diagnoses linked to this encounter. PDMP reviewed***  No follow-ups on file.  [name], MD

## 2022-06-11 NOTE — Patient Instructions (Incomplete)
It was a pleasure meeting you today. Thank you for allowing me to take part in your health care.  Our goals for today as we discussed include:  We will get some labs today.  If they are abnormal or we need to do something about them, I will call you.  If they are normal, I will send you a message on MyChart (if it is active) or a letter in the mail.  If you don't hear from [place] in 2 weeks, please call the office at the number below.   Recommend Shingles vaccine.  This is a 2 dose series and can be given at your local pharmacy.  Please talk to your pharmacist about this.   Schedule Medicare annual wellness June 2024.  Start probiotics 1 tablet daily to help improve soft stool. Start Metamucil daily or increase soluble fiber in diet  Restart Crestor 10 mg weekly as previously prescribed.  If you have any questions or concerns, please do not hesitate to call the office at [phone].  I look forward to our next visit and until then take care and stay safe.  Regards,   [name], MD   [facility]

## 2022-06-12 ENCOUNTER — Encounter: Payer: Self-pay | Admitting: Family Medicine

## 2022-06-12 ENCOUNTER — Ambulatory Visit (INDEPENDENT_AMBULATORY_CARE_PROVIDER_SITE_OTHER): Payer: Medicare Other | Admitting: Family Medicine

## 2022-06-12 VITALS — BP 120/70 | HR 83 | Temp 97.9°F | Ht 70.0 in | Wt 205.0 lb

## 2022-06-12 DIAGNOSIS — Z87898 Personal history of other specified conditions: Secondary | ICD-10-CM | POA: Diagnosis not present

## 2022-06-12 DIAGNOSIS — G4733 Obstructive sleep apnea (adult) (pediatric): Secondary | ICD-10-CM | POA: Diagnosis not present

## 2022-06-12 DIAGNOSIS — M1A9XX0 Chronic gout, unspecified, without tophus (tophi): Secondary | ICD-10-CM

## 2022-06-12 DIAGNOSIS — D649 Anemia, unspecified: Secondary | ICD-10-CM

## 2022-06-12 DIAGNOSIS — R7303 Prediabetes: Secondary | ICD-10-CM

## 2022-06-12 DIAGNOSIS — Z0000 Encounter for general adult medical examination without abnormal findings: Secondary | ICD-10-CM

## 2022-06-12 DIAGNOSIS — E785 Hyperlipidemia, unspecified: Secondary | ICD-10-CM

## 2022-06-12 MED ORDER — SACCHAROMYCES BOULARDII 250 MG PO CAPS: 250.0000 mg | ORAL_CAPSULE | Freq: Every day | ORAL | 0 refills | Status: DC

## 2022-06-12 MED ORDER — ROSUVASTATIN CALCIUM 20 MG PO TABS: ORAL_TABLET | ORAL | 1 refills | Status: DC

## 2022-06-12 MED ORDER — ALLOPURINOL 300 MG PO TABS: 300.0000 mg | ORAL_TABLET | Freq: Every day | ORAL | 3 refills | Status: DC

## 2022-06-12 NOTE — Assessment & Plan Note (Signed)
HCM Declined shingles vaccine No recent falls Blood pressure well-controlled Tdap due 5/26 Pneumonia completed Medicare annual wellness due 6/24

## 2022-06-12 NOTE — Assessment & Plan Note (Signed)
Chronic.  Asymptomatic. No signs of bleeding.   -CBC and ferritin today

## 2022-06-12 NOTE — Assessment & Plan Note (Signed)
Chronic.  Asymptomatic. Refill allopurinol

## 2022-06-12 NOTE — Assessment & Plan Note (Signed)
Recheck A1c 

## 2022-06-12 NOTE — Assessment & Plan Note (Signed)
Chronic.  Not currently using. Recommend restarting CPAP

## 2022-06-13 LAB — CBC WITH DIFFERENTIAL/PLATELET
Basophils Absolute: 0.1 10*3/uL (ref 0.0–0.1)
Basophils Relative: 0.9 % (ref 0.0–3.0)
Eosinophils Absolute: 0.3 10*3/uL (ref 0.0–0.7)
Eosinophils Relative: 3.1 % (ref 0.0–5.0)
HCT: 42.3 % (ref 39.0–52.0)
Hemoglobin: 13.9 g/dL (ref 13.0–17.0)
Lymphocytes Relative: 29.3 % (ref 12.0–46.0)
Lymphs Abs: 3.1 10*3/uL (ref 0.7–4.0)
MCHC: 33 g/dL (ref 30.0–36.0)
MCV: 94.5 fl (ref 78.0–100.0)
Monocytes Absolute: 0.9 10*3/uL (ref 0.1–1.0)
Monocytes Relative: 8.5 % (ref 3.0–12.0)
Neutro Abs: 6.1 10*3/uL (ref 1.4–7.7)
Neutrophils Relative %: 58.2 % (ref 43.0–77.0)
Platelets: 285 10*3/uL (ref 150.0–400.0)
RBC: 4.47 Mil/uL (ref 4.22–5.81)
RDW: 14.8 % (ref 11.5–15.5)
WBC: 10.5 10*3/uL (ref 4.0–10.5)

## 2022-06-13 LAB — HEMOGLOBIN A1C: Hgb A1c MFr Bld: 5.8 % (ref 4.6–6.5)

## 2022-07-14 ENCOUNTER — Inpatient Hospital Stay
Admission: EM | Admit: 2022-07-14 | Discharge: 2022-07-17 | DRG: 378 | Disposition: A | Discharge status: Home / Self Care | Payer: Medicare Other | Attending: Internal Medicine | Admitting: Internal Medicine

## 2022-07-14 ENCOUNTER — Encounter: Payer: Self-pay | Admitting: Intensive Care

## 2022-07-14 ENCOUNTER — Other Ambulatory Visit: Payer: Self-pay

## 2022-07-14 DIAGNOSIS — K31819 Angiodysplasia of stomach and duodenum without bleeding: Secondary | ICD-10-CM | POA: Diagnosis present

## 2022-07-14 DIAGNOSIS — K644 Residual hemorrhoidal skin tags: Secondary | ICD-10-CM | POA: Diagnosis present

## 2022-07-14 DIAGNOSIS — D62 Acute posthemorrhagic anemia: Secondary | ICD-10-CM | POA: Diagnosis present

## 2022-07-14 DIAGNOSIS — K5900 Constipation, unspecified: Secondary | ICD-10-CM | POA: Diagnosis present

## 2022-07-14 DIAGNOSIS — Z888 Allergy status to other drugs, medicaments and biological substances status: Secondary | ICD-10-CM

## 2022-07-14 DIAGNOSIS — Z880 Allergy status to penicillin: Secondary | ICD-10-CM

## 2022-07-14 DIAGNOSIS — K5731 Diverticulosis of large intestine without perforation or abscess with bleeding: Secondary | ICD-10-CM | POA: Diagnosis not present

## 2022-07-14 DIAGNOSIS — Z91041 Radiographic dye allergy status: Secondary | ICD-10-CM | POA: Diagnosis not present

## 2022-07-14 DIAGNOSIS — Z79899 Other long term (current) drug therapy: Secondary | ICD-10-CM | POA: Diagnosis not present

## 2022-07-14 DIAGNOSIS — Z96653 Presence of artificial knee joint, bilateral: Secondary | ICD-10-CM | POA: Diagnosis present

## 2022-07-14 DIAGNOSIS — Z9049 Acquired absence of other specified parts of digestive tract: Secondary | ICD-10-CM | POA: Diagnosis not present

## 2022-07-14 DIAGNOSIS — Z85828 Personal history of other malignant neoplasm of skin: Secondary | ICD-10-CM

## 2022-07-14 DIAGNOSIS — Z951 Presence of aortocoronary bypass graft: Secondary | ICD-10-CM | POA: Diagnosis not present

## 2022-07-14 DIAGNOSIS — Z87891 Personal history of nicotine dependence: Secondary | ICD-10-CM

## 2022-07-14 DIAGNOSIS — G4733 Obstructive sleep apnea (adult) (pediatric): Secondary | ICD-10-CM | POA: Diagnosis present

## 2022-07-14 DIAGNOSIS — D72829 Elevated white blood cell count, unspecified: Secondary | ICD-10-CM | POA: Diagnosis present

## 2022-07-14 DIAGNOSIS — K5730 Diverticulosis of large intestine without perforation or abscess without bleeding: Secondary | ICD-10-CM | POA: Diagnosis present

## 2022-07-14 DIAGNOSIS — K922 Gastrointestinal hemorrhage, unspecified: Secondary | ICD-10-CM | POA: Diagnosis present

## 2022-07-14 DIAGNOSIS — Z86010 Personal history of colonic polyps: Secondary | ICD-10-CM | POA: Diagnosis not present

## 2022-07-14 DIAGNOSIS — Z8546 Personal history of malignant neoplasm of prostate: Secondary | ICD-10-CM

## 2022-07-14 DIAGNOSIS — E785 Hyperlipidemia, unspecified: Secondary | ICD-10-CM | POA: Diagnosis present

## 2022-07-14 DIAGNOSIS — M109 Gout, unspecified: Secondary | ICD-10-CM | POA: Diagnosis present

## 2022-07-14 DIAGNOSIS — I2510 Atherosclerotic heart disease of native coronary artery without angina pectoris: Secondary | ICD-10-CM | POA: Diagnosis present

## 2022-07-14 DIAGNOSIS — Z820 Family history of epilepsy and other diseases of the nervous system: Secondary | ICD-10-CM

## 2022-07-14 DIAGNOSIS — Z85038 Personal history of other malignant neoplasm of large intestine: Secondary | ICD-10-CM

## 2022-07-14 DIAGNOSIS — I10 Essential (primary) hypertension: Secondary | ICD-10-CM | POA: Diagnosis present

## 2022-07-14 DIAGNOSIS — K625 Hemorrhage of anus and rectum: Secondary | ICD-10-CM | POA: Diagnosis not present

## 2022-07-14 DIAGNOSIS — Z801 Family history of malignant neoplasm of trachea, bronchus and lung: Secondary | ICD-10-CM

## 2022-07-14 DIAGNOSIS — K649 Unspecified hemorrhoids: Secondary | ICD-10-CM | POA: Diagnosis not present

## 2022-07-14 DIAGNOSIS — D649 Anemia, unspecified: Secondary | ICD-10-CM | POA: Diagnosis present

## 2022-07-14 DIAGNOSIS — C61 Malignant neoplasm of prostate: Secondary | ICD-10-CM | POA: Diagnosis not present

## 2022-07-14 DIAGNOSIS — I499 Cardiac arrhythmia, unspecified: Secondary | ICD-10-CM | POA: Diagnosis not present

## 2022-07-14 HISTORY — DX: Gastrointestinal hemorrhage, unspecified: K92.2

## 2022-07-14 LAB — CBC
HCT: 39.3 % (ref 39.0–52.0)
Hemoglobin: 12.7 g/dL — ABNORMAL LOW (ref 13.0–17.0)
MCH: 30.3 pg (ref 26.0–34.0)
MCHC: 32.3 g/dL (ref 30.0–36.0)
MCV: 93.8 fL (ref 80.0–100.0)
Platelets: 310 10*3/uL (ref 150–400)
RBC: 4.19 MIL/uL — ABNORMAL LOW (ref 4.22–5.81)
RDW: 14.2 % (ref 11.5–15.5)
WBC: 13.6 10*3/uL — ABNORMAL HIGH (ref 4.0–10.5)
nRBC: 0 % (ref 0.0–0.2)

## 2022-07-14 LAB — TYPE AND SCREEN
ABO/RH(D): O POS
Antibody Screen: NEGATIVE

## 2022-07-14 LAB — COMPREHENSIVE METABOLIC PANEL
ALT: 28 U/L (ref 0–44)
AST: 35 U/L (ref 15–41)
Albumin: 4 g/dL (ref 3.5–5.0)
Alkaline Phosphatase: 63 U/L (ref 38–126)
Anion gap: 8 (ref 5–15)
BUN: 23 mg/dL (ref 8–23)
CO2: 23 mmol/L (ref 22–32)
Calcium: 8.7 mg/dL — ABNORMAL LOW (ref 8.9–10.3)
Chloride: 106 mmol/L (ref 98–111)
Creatinine, Ser: 1.14 mg/dL (ref 0.61–1.24)
GFR, Estimated: >60 mL/min (ref >60)
Glucose, Bld: 109 mg/dL — ABNORMAL HIGH (ref 70–99)
Potassium: 4.4 mmol/L (ref 3.5–5.1)
Sodium: 137 mmol/L (ref 135–145)
Total Bilirubin: 1.5 mg/dL — ABNORMAL HIGH (ref 0.3–1.2)
Total Protein: 7.4 g/dL (ref 6.5–8.1)

## 2022-07-14 LAB — PROTIME-INR
INR: 1.2 (ref 0.8–1.2)
Prothrombin Time: 15 seconds (ref 11.4–15.2)

## 2022-07-14 MED ORDER — ACETAMINOPHEN 325 MG PO TABS: 650.0000 mg | ORAL_TABLET | Freq: Four times a day (QID) | ORAL | Status: DC | PRN

## 2022-07-14 MED ORDER — SODIUM CHLORIDE 0.9 % IV SOLN: Freq: Once | INTRAVENOUS | Status: AC

## 2022-07-14 MED ORDER — ONDANSETRON HCL 4 MG PO TABS: 4.0000 mg | ORAL_TABLET | Freq: Four times a day (QID) | ORAL | Status: DC | PRN

## 2022-07-14 MED ORDER — SENNOSIDES-DOCUSATE SODIUM 8.6-50 MG PO TABS: 1.0000 | ORAL_TABLET | Freq: Every evening | ORAL | Status: DC | PRN

## 2022-07-14 MED ORDER — ALLOPURINOL 100 MG PO TABS
300.0000 mg | ORAL_TABLET | Freq: Every day | ORAL | Status: DC
  Administered 2022-07-15 – 2022-07-17 (×3): 300 mg via ORAL
  Filled 2022-07-14 (×3): qty 3

## 2022-07-14 MED ORDER — ONDANSETRON HCL 4 MG/2ML IJ SOLN: 4.0000 mg | Freq: Four times a day (QID) | INTRAMUSCULAR | Status: DC | PRN

## 2022-07-14 MED ORDER — ROSUVASTATIN CALCIUM 10 MG PO TABS
10.0000 mg | ORAL_TABLET | Freq: Every day | ORAL | Status: AC
  Administered 2022-07-15: 10 mg via ORAL
  Filled 2022-07-14: qty 1

## 2022-07-14 MED ORDER — ACETAMINOPHEN 650 MG RE SUPP: 650.0000 mg | Freq: Four times a day (QID) | RECTAL | Status: DC | PRN

## 2022-07-14 NOTE — ED Triage Notes (Signed)
Patient presents with rectal bleeding that started last night. Reports dark in color.   History colon cancer. Reports about 18 inches removed from colon.

## 2022-07-14 NOTE — Assessment & Plan Note (Addendum)
Presumed secondary to acute GI bleed, rectal Does not meet criteria for blood transfusion at this time CBC in a.m.

## 2022-07-14 NOTE — Hospital Course (Addendum)
[name] is a 88 year old male with history of hemorrhoids, upper GI bleed, lower GI bleed, colon cancer status post hemicolectomy with primary anastomosis in July 2023, CAD, hyperlipidemia, OSA on CPAP, myalgia, who presents emergency department for chief concerns of rectal bleeding.  Vitals show temperature of 97.9, respiration rate of 18, heart rate of 110, blood pressure 111/82, SpO2 97% on room air.  Serum sodium is 137, potassium 4.4, chloride 106, bicarb 23, BUN of 23, serum creatinine 1.14, EGFR greater than 60, nonfasting blood glucose 109, WBC 13.6, hemoglobin 12.7, platelets of 310.  T. bili was elevated at 1.5.  EDP ordered nuclear medicine GI blood loss.  ED treatment: None

## 2022-07-14 NOTE — H&P (Signed)
History and Physical   Phillip Nelson:[id number] DOB: [birth year] DOA: 07/14/2022  PCP: Dana Allan, MD  Outpatient Specialists: Dr. Allegra Lai, gastroenterology Patient coming from: Home   I have personally briefly reviewed patient's old medical records in [facility] EMR.  Chief Concern: Lower GI bleed  HPI: [name] is a 88 year old male with history of hemorrhoids, upper GI bleed, lower GI bleed, colon cancer status post hemicolectomy with primary anastomosis in July 2023, CAD, hyperlipidemia, OSA on CPAP, myalgia, who presents emergency department for chief concerns of rectal bleeding.  Vitals show temperature of 97.9, respiration rate of 18, heart rate of 110, blood pressure 111/82, SpO2 97% on room air.  Serum sodium is 137, potassium 4.4, chloride 106, bicarb 23, BUN of 23, serum creatinine 1.14, EGFR greater than 60, nonfasting blood glucose 109, WBC 13.6, hemoglobin 12.7, platelets of 310.  T. bili was elevated at 1.5.  EDP ordered nuclear medicine GI blood loss.  ED treatment: None ----------------------------- At bedside, he is able to tell me his name, age, current calendar, current location.   He reports he started having bleeding from his rectum on 07/13/22. He had a constipation.  He had a large ball of stool that required digital manipulation to have the bowel movement. Twice large amount of blood loss yesterday and once large bowel movement with a lot of blood today.   He denies fever, chills, chest pain, shortness of breath, cough, dysuria, abdominal pain.   Social history: He lives with a partner, Alona Bene. He denies tobacco, etoh, and recreational. 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   ROS: Constitutional: no weight change, no fever ENT/Mouth: no sore throat, no rhinorrhea Eyes: no eye pain, no vision changes Cardiovascular: no chest pain, no dyspnea,  no edema, no palpitations Respiratory: no cough, no sputum, no  wheezing Gastrointestinal: no nausea, no vomiting, no diarrhea, no constipation, + rectal bleeding Genitourinary: no urinary incontinence, no dysuria, no hematuria Musculoskeletal: no arthralgias, no myalgias Skin: no skin lesions, no pruritus, Neuro: + weakness, no loss of consciousness, no syncope Psych: no anxiety, no depression, + decrease appetite Heme/Lymph: no bruising, + bleeding  ED Course: Discussed with emergency medicine provider, patient requiring hospitalization for chief concerns of lower GI bleeding with anemia.  Assessment/Plan  Principal Problem:   Acute lower GI bleeding Active Problems:   Coronary artery disease involving native coronary artery of native heart without angina pectoris   Hyperlipidemia with target LDL less than 70   OSA (obstructive sleep apnea)   Anemia   AVM (arteriovenous malformation) of duodenum, acquired   Assessment and Plan:  * Acute lower GI bleeding Clear liquids Continue EDP order of nuclear medicine bleeding scan Gastroenterology has been consulted for consideration of colonoscopy Admit to telemetry medical, inpatient  Anemia Presumed secondary to acute GI bleed, rectal Does not meet criteria for blood transfusion at this time CBC in a.m.  OSA (obstructive sleep apnea) Patient states he does not want a CPAP mask at night  Hyperlipidemia with target LDL less than 70 Rosuvastatin 10 mg nightly resumed 07/15/2022 one-time dose Patient states that he has not been taking his statin medication because in the past it has caused him to have muscle aches.  His PCP has advised him to try taking a half a dose 2-3 times per week however he has not been doing so.  He is amendable to trying it one-time dose of rosuvastatin 10 mg nightly.  Coronary artery disease  involving native coronary artery of native heart without angina pectoris Counseled patient on taking rosuvastatin 10 mg nightly regularly  Chart reviewed.   DVT prophylaxis: TED  hose; AM team to initiate pharmacologic DVT prophylaxis when the benefits outweigh the risk Code Status: full code Diet: Clear liquids Family Communication: a phone call was offered, patient declined Disposition Plan: Pending clinical course and GI evaluation Consults called: Gastroenterology Admission status: Telemetry medical, inpatient  Past Medical History:  Diagnosis Date   Adenocarcinoma of prostate 05/08/2012   a.) Gleason = 3+3 = 6; PSA = 8.26; volume = 57cc; b.) s/p brachytherapy   Adenocarcinoma of transverse colon 08/24/2021   a.) Bx (+) for moderately differentiated adenocarcinoma   Aortic atherosclerosis    Aural vertigo    AVM (arteriovenous malformation) of duodenum, acquired    Basal cell carcinoma 12/31/2006   L mid infraclavicular. Excised 02/27/2007, margins free.   Basal cell carcinoma 10/30/2016    R post shoulder. Nodular pattern.   Basal cell carcinoma 03/31/2020   L lat pretibial - ED&C    Coronary artery disease involving native coronary artery of native heart with other form of angina pectoris 09/2003   a.) followed by [facility] Gwen Pounds); transitioned to [facility] Herbie [place]) 06/2021; b.) NSTEMI 09/2003 (Sx= heaviness in chest ~100lb brick, no SOB); LHC --> 95% oLAD with diffuse 50% stenosis -> referred to CVTS; c.) s/p CABG x 1 (LIMA-LAD) 10/19/2003; d.) Non-Ischemic Cardiolite 07/2016   Daily headache 09/22/2019   Diastolic dysfunction    a.) TTE 08/04/2014: EF 45%, mild global HK, mild LVH, mild MAC, mild TR/PR; b.) TTE 08/10/2016: EF 50%, mild LVH, mild MAC, mild MR/TR/PR; c.) TTE 07/29/2021: ED 55-60%, G1DD, Ao sclerosis with no stenosis.   Diverticulitis    DOE (dyspnea on exertion)    DOE (dyspnea on exertion) 07/12/2021   ED (erectile dysfunction)    Frequent headaches    GI bleed 11/20/2018   Goals of care, counseling/discussion 08/31/2021   Gout, arthritis    Hemorrhoids    History of heart attack 09/2003   History of lower GI bleeding  03/02/2015   History of prostate cancer 05/08/2012   Adenocarcinoma,gleason=3+3=6,PSA=8.26,vol=57cc  75 seeds treatment   HTN (hypertension)    Hyperlipidemia    Hypotension due to hypovolemia    IDA (iron deficiency anemia)    Myalgia due to statin    NSTEMI (non-ST elevated myocardial infarction) 10/17/2003   a.) LHC 10/18/2003 --> 95% oLAD with multiple 50% sequential lesions --> referred to CVTS. b.) CABG x 1 (LIMA-LAD) 10/19/2003.   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 surgery.    BILATERAL CARPAL TUNNEL RELEASE Right 06/28/2001   Right (endoscopic)   CARPECTOMY Right  wrist   CATARACT EXTRACTION W/ INTRAOCULAR LENS  IMPLANT, BILATERAL     COLONOSCOPY WITH PROPOFOL N/A 10/31/2016   Procedure: COLONOSCOPY WITH PROPOFOL;  Surgeon: Kieth Brightly, MD;  Location: [facility] ENDOSCOPY;  Service: Endoscopy;  Laterality: N/A;   COLONOSCOPY WITH PROPOFOL N/A 08/24/2021   Procedure: COLONOSCOPY WITH PROPOFOL;  Surgeon: Toney Reil, MD;  Location: [facility] ENDOSCOPY;  Service: Gastroenterology;  Laterality: N/A;   CORONARY ARTERY BYPASS GRAFT  10/19/2003   Procedure: CORONARY ARTERY BYPASS GRAFT; Locaation: [facility]; Surgeon:  Carlyon Prows, IV, MD   DORSAL COMPARTMENT RELEASE Left 06/25/2017   Procedure: LEFT WRIST TENDON SHEATH RELEASE;   Surgeon: Mack Hook, MD;  Location: [facility];  Service: Orthopedics;  Laterality: Left;   EMBOLIZATION N/A 11/21/2018   Procedure: EMBOLIZATION (Colonic);  Surgeon: Renford Dills, MD;  Location: [facility] INVASIVE CV LAB;  Service: Cardiovascular;  Laterality: N/A;   ESOPHAGOGASTRODUODENOSCOPY (EGD) WITH PROPOFOL N/A 08/24/2021   Procedure: ESOPHAGOGASTRODUODENOSCOPY (EGD) WITH PROPOFOL;  Surgeon: Toney Reil, MD;  Location: [facility] ENDOSCOPY;  Service: Gastroenterology;  Laterality: N/A;   FOOT SURGERY Bilateral    GOUT   KNEE ARTHROSCOPY Bilateral 1986   Right: 1986; left 09/06/2009   LAPAROSCOPIC RIGHT COLECTOMY N/A 10/11/2021   Procedure: LAPAROSCOPIC RIGHT COLECTOMY-RNFA to assist Converted to open procedure;  Surgeon: Leafy Ro, MD;  Location: [facility] ORS;  Service: General;  Laterality: N/A;  Provider is requesting 3 hours(180 minutes) for this case   LEFT ELBOW SURGERY  AGE 89  &  2007   LEFT HEART CATH AND CORONARY ANGIOGRAPHY  10/18/2003   [facility] (non-STEMI): Ostial LAD 95% with multiple sequential 50% stenoses. => Referred for CABG   NM GATED MYOCARDIAL STUDY (ARMX HX)  07/31/2016   Treadmill Myoview/Cardiolite : [facility]): 4.6 METS, 92 % MPHR.  EF 55 to 60%.  Normal wall motion.  No ischemia or infarction.   OLECRANON BURSA EXCISION Right 06/28/2001   OLECRANON BURSA EXCISION Left 06/24/2007   RADIOACTIVE SEED IMPLANT N/A 08/02/2012   Procedure: RADIOACTIVE SEED IMPLANT;  Surgeon: Garnett Farm, MD;  Location: [facility];  Service: Urology;  Laterality: N/A;   TOTAL KNEE ARTHROPLASTY Bilateral 04/14/2004   RIGHT 2006; LEFT 11/21/2010   TRANSTHORACIC ECHOCARDIOGRAM  07/31/2016   ([facility]) normal LV function.  Mild LVH.  EF 50%.  Normal RV.  Mild MR, TR and PR.  No stenosis.   Social History:  reports that he quit smoking about 34 years ago. His smoking use included cigarettes. He has a 93.00 pack-year smoking  history. He has never used smokeless tobacco. He reports that he does not currently use alcohol. He reports that he does not use drugs.  Allergies  Allergen Reactions   Indocin [Indomethacin] Other (See Comments)    Shakes. tremors   Iodinated Contrast Media Hives   Penicillins Itching, Rash and Other (See Comments)    Did it involve swelling of the face/tongue/throat, SOB, or low BP? Unknown Did it involve sudden or severe rash/hives, skin peeling, or any reaction on the inside of your mouth or nose? Unknown Did you need to seek medical attention at a hospital or doctor's office? Unknown When did it last happen? unknown If all above answers are "NO", may proceed with cephalosporin use.    Family History  Problem Relation Age of Onset   Cerebral aneurysm Mother    Alzheimer's disease Father    Lung cancer Sister 88  treated surgically   Cancer - Lung Sister    Family history: Family history reviewed and not pertinent.  Prior to Admission medications   Medication Sig Start Date End Date Taking? Authorizing Provider  allopurinol (ZYLOPRIM) 300 MG tablet Take 1 tablet (300 mg total) by mouth daily. 06/12/22   Dana Allan, MD  rosuvastatin (CRESTOR) 20 MG tablet Take 0.5 tablet (10 mg) by mouth once a week as directed 06/12/22   Dana Allan, MD  saccharomyces boulardii (FLORASTOR) 250 MG capsule Take 1 capsule (250 mg total) by mouth daily. 06/12/22   Dana Allan, MD   Physical Exam: Vitals:   07/14/22 1657 07/14/22 1700 07/14/22 1812 07/14/22 1815  BP:  127/89 (!) 143/72 129/62  Pulse: 80 74 73 82  Resp:   17 14  Temp:   97.8 F (36.6 C)   TempSrc:   Oral   SpO2: 100% 100% 100% 99%  Weight:      Height:       Constitutional: appears age-appropriate, NAD, calm Eyes: PERRL, lids and conjunctivae normal ENMT: Mucous membranes are moist. Posterior pharynx clear of any exudate or lesions. Age-appropriate dentition. Hearing appropriate Neck: normal, supple, no masses, no  thyromegaly Respiratory: clear to auscultation bilaterally, no wheezing, no crackles. Normal respiratory effort. No accessory muscle use.  Cardiovascular: Regular rate and rhythm, no murmurs / rubs / gallops. No extremity edema. 2+ pedal pulses. No carotid bruits.  Abdomen: no tenderness, no masses palpated, no hepatosplenomegaly. Bowel sounds positive.  Musculoskeletal: no clubbing / cyanosis. No joint deformity upper and lower extremities. Good ROM, no contractures, no atrophy. Normal muscle tone.  Skin: no rashes, lesions, ulcers. No induration Neurologic: Sensation intact. Strength 5/5 in all 4.  Psychiatric: Normal judgment and insight. Alert and oriented x 3. Normal mood.   EKG: independently reviewed, showing sinus rhythm with rate of 87, QTc 476  Chest x-ray on Admission: Not indicated at this time  Labs on Admission: I have personally reviewed following labs CBC: Recent Labs  Lab 07/14/22 1426  WBC 13.6*  HGB 12.7*  HCT 39.3  MCV 93.8  PLT 310   Basic Metabolic Panel: Recent Labs  Lab 07/14/22 1426  NA 137  K 4.4  CL 106  CO2 23  GLUCOSE 109*  BUN 23  CREATININE 1.14  CALCIUM 8.7*   GFR: Estimated Creatinine Clearance: 53.8 mL/min (by C-G formula based on SCr of 1.14 mg/dL).  Liver Function Tests: Recent Labs  Lab 07/14/22 1426  AST 35  ALT 28  ALKPHOS 63  BILITOT 1.5*  PROT 7.4  ALBUMIN 4.0   Coagulation Profile: Recent Labs  Lab 07/14/22 1656  INR 1.2   Urine analysis:    Component Value Date/Time   COLORURINE YELLOW (A) 05/11/2017 1645   APPEARANCEUR CLEAR (A) 05/11/2017 1645   LABSPEC 1.013 05/11/2017 1645   PHURINE 8.0 05/11/2017 1645   GLUCOSEU NEGATIVE 05/11/2017 1645   HGBUR NEGATIVE 05/11/2017 1645   BILIRUBINUR NEGATIVE 05/11/2017 1645   KETONESUR NEGATIVE 05/11/2017 1645   PROTEINUR NEGATIVE 05/11/2017 1645   NITRITE NEGATIVE 05/11/2017 1645   LEUKOCYTESUR NEGATIVE 05/11/2017 1645   This document was prepared using Dragon  Voice Recognition software and may include unintentional dictation errors.  Dr. Sedalia Muta [facility]  If 7PM-7AM, please contact overnight-coverage provider If 7AM-7PM, please contact day attending provider [email or web address]  07/14/2022, 6:51 PM

## 2022-07-14 NOTE — Assessment & Plan Note (Addendum)
Melena - pt reported bloody stools were very dark in color, almost black.  NM bleed scan 4/20 -- negative. Colonoscopy 4/21 - no active bleeding, suspect this was diverticular bleed. --Mgmt per GI - follow up recommendations --Diet resumed --IV PPI BID for now --Avoid all NSAID's & anticoagulants --SCD's for DVT prophylaxis

## 2022-07-14 NOTE — ED Notes (Signed)
Pt opened door to catch MD as he was walking by. EDT and MD were made aware of the toilet that had bright red blood filling its contents. Pt stated he had to go to the bathroom and thought this was something the doctor should see. MD aware and talked with pt.   EDT reconnected pt to all monitoring devices and cardiac monitor. EDT updated a set of VS, put pts call bell within reach, gave pt a pillow, and two blankets for comfort. Pt instructed to use the call bell for further needs or questions. Pt denied needing anything else at this time.

## 2022-07-14 NOTE — ED Provider Notes (Signed)
[facility] Provider Note    Event Date/Time   First MD Initiated Contact with Patient 07/14/22 1606     (approximate)   History   Rectal Bleeding   HPI  Phillip Nelson is a 88 y.o. male  with h/o colon CA s/p resection here with rectal bleeding. Pt states he had a firm BM yesterday that required a little "breaking up," but was not painful. He also sat on a lawnmower all afternoon to mow his grass. Late yesterday evening, he began bleeding and has been having gross rectal bleeding since then. Reports dark red and bright red liquid blood in his stool x 4-5 times including today. Has had one episode similar to this since colectomy in July last year, but has not had a colonoscopy yet. No current pain. No fevers. No anticoagulant use.       Physical Exam   Triage Vital Signs: ED Triage Vitals  Enc Vitals Group     BP 07/14/22 1422 111/82     Pulse Rate 07/14/22 1422 (!) 110     Resp 07/14/22 1422 18     Temp 07/14/22 1422 97.9 F (36.6 C)     Temp Source 07/14/22 1422 Oral     SpO2 07/14/22 1422 97 %     Weight 07/14/22 1424 201 lb (91.2 kg)     Height 07/14/22 1424  (1.778 m)     Head Circumference --      Peak Flow --      Pain Score 07/14/22 1423 0     Pain Loc --      Pain Edu? --      Excl. in GC? --     Most recent vital signs: Vitals:   07/14/22 1657 07/14/22 1700  BP:  127/89  Pulse: 80 74  Resp:    Temp:    SpO2: 100% 100%     General: Awake, no distress.  CV:  Good peripheral perfusion.  Resp:  Normal work of breathing.  Abd:  No distention. Hyperactive bowel sounds. No significant tenderness. On rectal exam, moderate amount of dark red bloody stool in rectal vault. No external bleeding. No fissure. Other:  Non-toxic. Alert, oriented.   ED Results / Procedures / Treatments   Labs (all labs ordered are listed, but only abnormal results are displayed) Labs Reviewed  COMPREHENSIVE METABOLIC PANEL - Abnormal;  Notable for the following components:      Result Value   Glucose, Bld 109 (*)    Calcium 8.7 (*)    Total Bilirubin 1.5 (*)    All other components within normal limits  CBC - Abnormal; Notable for the following components:   WBC 13.6 (*)    RBC 4.19 (*)    Hemoglobin 12.7 (*)    All other components within normal limits  PROTIME-INR  POC OCCULT BLOOD, ED  TYPE AND SCREEN     EKG    RADIOLOGY   I also independently reviewed and agree with radiologist interpretations.   PROCEDURES:  Critical Care performed: No  Procedures    MEDICATIONS ORDERED IN ED: Medications  0.9 %  sodium chloride infusion ( Intravenous New Bag/Given 07/14/22 1703)     IMPRESSION / MDM / ASSESSMENT AND PLAN / ED COURSE  I reviewed the triage vital signs and the nursing notes.  Differential diagnosis includes, but is not limited to, Lower GI bleed, recurrent rectal CA, diverticulosis, hemorrhoids, blood loss anemia, UGIB  Patient's presentation is most consistent with acute presentation with potential threat to life or bodily function.  The patient is on the cardiac monitor to evaluate for evidence of arrhythmia and/or significant heart rate changes   88 yo M with h/o colon CA s/p resection here with bright red bleeding per rectum, weakness. Pt mildly tachycardic with Hgb below baseline though >12. No signs of ischemia. No major abd pain or signs to suggest perforation. Unable to obtain contrast 2/2 allergy. Will type and screen, order NM GI bleeding scan, admit to medicine. Pt in agreement. Not on anticoagulation. Hemoccult positive with grossly bloody stool on exam.   FINAL CLINICAL IMPRESSION(S) / ED DIAGNOSES   Final diagnoses:  Lower GI bleed     Rx / DC Orders   ED Discharge Orders     None        Note:  This document was prepared using Dragon voice recognition software and may include unintentional dictation errors.   Shaune Pollack,  MD 07/14/22 [phone]

## 2022-07-14 NOTE — Assessment & Plan Note (Deleted)
-   CPAP nightly ordered 

## 2022-07-14 NOTE — Assessment & Plan Note (Signed)
Stable, no chest pain. Continue statin.   Appears not on ASA or beta blocker.

## 2022-07-14 NOTE — Assessment & Plan Note (Signed)
Rosuvastatin 10 mg nightly resumed 07/15/2022 one-time dose Patient states that he has not been taking his statin medication because in the past it has caused him to have muscle aches.  His PCP has advised him to try taking a half a dose 2-3 times per week however he has not been doing so.  He is amendable to trying it one-time dose of rosuvastatin 10 mg nightly.

## 2022-07-14 NOTE — Assessment & Plan Note (Signed)
Patient states he does not want a CPAP mask at night

## 2022-07-14 NOTE — ED Notes (Addendum)
ED TO INPATIENT HANDOFF REPORT  ED Nurse Name and Phone #: Gwenyth Bender [phone]  S Name/Age/Gender Elie Confer 88 y.o. male Room/Bed: ED25A/ED25A  Code Status   Code Status: Full Code  Home/SNF/Other Home Patient oriented to: self, place, time, and situation Is this baseline? Yes   Triage Complete: Triage complete  Chief Complaint Acute lower GI bleeding [K92.2]  Triage Note Patient presents with rectal bleeding that started last night. Reports dark in color.   History colon cancer. Reports about 18 inches removed from colon.    Allergies Allergies  Allergen Reactions   Indocin [Indomethacin] Other (See Comments)    Shakes. tremors   Iodinated Contrast Media Hives   Penicillins Itching, Rash and Other (See Comments)    Did it involve swelling of the face/tongue/throat, SOB, or low BP? Unknown Did it involve sudden or severe rash/hives, skin peeling, or any reaction on the inside of your mouth or nose? Unknown Did you need to seek medical attention at a hospital or doctor's office? Unknown When did it last happen? unknown If all above answers are "NO", may proceed with cephalosporin use.     Level of Care/Admitting Diagnosis ED Disposition     ED Disposition  Admit   Condition  --   Comment  Hospital Area: [facility] [[id number]]  Level of Care: Telemetry Medical [104]  Covid Evaluation: Asymptomatic - no recent exposure (last 10 days) testing not required  Diagnosis: Acute lower GI bleeding [238177]  Admitting Physician: COX, Nadyne Coombes [[id number]]  Attending Physician: COX, AMY N [id number]  Certification:: I certify this patient will need inpatient services for at least 2 midnights  Estimated Length of Stay: 3          B Medical/Surgery History Past Medical History:  Diagnosis Date   Adenocarcinoma of prostate 05/08/2012   a.) Gleason = 3+3 = 6; PSA = 8.26; volume = 57cc; b.) s/p brachytherapy   Adenocarcinoma of transverse  colon 08/24/2021   a.) Bx (+) for moderately differentiated adenocarcinoma   Aortic atherosclerosis    Aural vertigo    AVM (arteriovenous malformation) of duodenum, acquired    Basal cell carcinoma 12/31/2006   L mid infraclavicular. Excised 02/27/2007, margins free.   Basal cell carcinoma 10/30/2016    R post shoulder. Nodular pattern.   Basal cell carcinoma 03/31/2020   L lat pretibial - ED&C    Coronary artery disease involving native coronary artery of native heart with other form of angina pectoris 09/2003   a.) followed by [facility] Gwen Pounds); transitioned to [facility] Herbie [place]) 06/2021; b.) NSTEMI 09/2003 (Sx= heaviness in chest ~100lb brick, no SOB); LHC --> 95% oLAD with diffuse 50% stenosis -> referred to CVTS; c.) s/p CABG x 1 (LIMA-LAD) 10/19/2003; d.) Non-Ischemic Cardiolite 07/2016   Daily headache 09/22/2019   Diastolic dysfunction    a.) TTE 08/04/2014: EF 45%, mild global HK, mild LVH, mild MAC, mild TR/PR; b.) TTE 08/10/2016: EF 50%, mild LVH, mild MAC, mild MR/TR/PR; c.) TTE 07/29/2021: ED 55-60%, G1DD, Ao sclerosis with no stenosis.   Diverticulitis    DOE (dyspnea on exertion)    DOE (dyspnea on exertion) 07/12/2021   ED (erectile dysfunction)    Frequent headaches    GI bleed 11/20/2018   Goals of care, counseling/discussion 08/31/2021   Gout, arthritis    Hemorrhoids    History of heart attack 09/2003   History of lower GI bleeding 03/02/2015   History of prostate cancer 05/08/2012  Adenocarcinoma,gleason=3+3=6,PSA=8.26,vol=57cc  75 seeds treatment   HTN (hypertension)    Hyperlipidemia    Hypotension due to hypovolemia    IDA (iron deficiency anemia)    Myalgia due to statin    NSTEMI (non-ST elevated myocardial infarction) 10/17/2003   a.) LHC 10/18/2003 --> 95% oLAD with multiple 50% sequential lesions --> referred to CVTS. b.) CABG x 1 (LIMA-LAD) 10/19/2003.   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 surgery.    BILATERAL CARPAL TUNNEL RELEASE Right 06/28/2001   Right (endoscopic)   CARPECTOMY Right    wrist   CATARACT EXTRACTION W/ INTRAOCULAR LENS  IMPLANT, BILATERAL     COLONOSCOPY WITH PROPOFOL N/A 10/31/2016   Procedure: COLONOSCOPY WITH PROPOFOL;  Surgeon: Kieth Brightly, MD;  Location: [facility] ENDOSCOPY;  Service: Endoscopy;  Laterality: N/A;   COLONOSCOPY WITH PROPOFOL N/A 08/24/2021   Procedure: COLONOSCOPY WITH PROPOFOL;  Surgeon: Toney Reil, MD;  Location: [facility] ENDOSCOPY;  Service: Gastroenterology;  Laterality: N/A;   CORONARY ARTERY BYPASS GRAFT  10/19/2003   Procedure: CORONARY ARTERY BYPASS GRAFT; Locaation: [facility]; Surgeon:  Carlyon Prows, IV, MD   DORSAL COMPARTMENT RELEASE Left 06/25/2017   Procedure: LEFT WRIST TENDON SHEATH RELEASE;  Surgeon: Mack Hook, MD;  Location: [facility];  Service: Orthopedics;  Laterality: Left;   EMBOLIZATION N/A 11/21/2018   Procedure: EMBOLIZATION (Colonic);  Surgeon: Renford Dills, MD;  Location: [facility] INVASIVE CV LAB;  Service: Cardiovascular;  Laterality: N/A;   ESOPHAGOGASTRODUODENOSCOPY (EGD) WITH PROPOFOL N/A 08/24/2021   Procedure: ESOPHAGOGASTRODUODENOSCOPY (EGD) WITH PROPOFOL;  Surgeon: Toney Reil, MD;  Location: [facility] ENDOSCOPY;  Service: Gastroenterology;  Laterality: N/A;   FOOT SURGERY  Bilateral    GOUT   KNEE ARTHROSCOPY Bilateral 1986   Right: 1986; left 09/06/2009   LAPAROSCOPIC RIGHT COLECTOMY N/A 10/11/2021   Procedure: LAPAROSCOPIC RIGHT COLECTOMY-RNFA to assist Converted to open procedure;  Surgeon: Leafy Ro, MD;  Location: [facility] ORS;  Service: General;  Laterality: N/A;  Provider is requesting 3 hours(180 minutes) for this case   LEFT ELBOW SURGERY  AGE 46  &  2007   LEFT HEART CATH AND CORONARY ANGIOGRAPHY  10/18/2003   [facility] (non-STEMI): Ostial LAD 95% with multiple sequential 50% stenoses. => Referred for CABG   NM GATED MYOCARDIAL STUDY (ARMX HX)  07/31/2016   Treadmill Myoview/Cardiolite : [facility]): 4.6 METS, 92 % MPHR.  EF 55 to 60%.  Normal wall motion.  No ischemia or infarction.   OLECRANON BURSA EXCISION Right 06/28/2001   OLECRANON BURSA EXCISION Left 06/24/2007   RADIOACTIVE SEED IMPLANT N/A 08/02/2012   Procedure: RADIOACTIVE SEED IMPLANT;  Surgeon: Garnett Farm, MD;  Location: [facility];  Service: Urology;  Laterality: N/A;   TOTAL KNEE ARTHROPLASTY Bilateral 04/14/2004   RIGHT 2006; LEFT 11/21/2010   TRANSTHORACIC ECHOCARDIOGRAM  07/31/2016   ([facility]) normal LV function.  Mild LVH.  EF 50%.  Normal RV.  Mild MR, TR and PR.  No stenosis.     A IV Location/Drains/Wounds Patient Lines/Drains/Airways Status     Active Line/Drains/Airways     Name Placement date Placement time Site Days   Peripheral IV 07/14/22 20 G Left Antecubital 07/14/22  1656  Antecubital  less than 1            Intake/Output Last 24 hours No intake or output data in the 24 hours ending 07/14/22 1956  Labs/Imaging Results for orders placed or performed during the hospital encounter of 07/14/22 (from the past 48 hour(s))  Comprehensive metabolic panel     Status: Abnormal   Collection Time: 07/14/22  2:26 PM  Result Value Ref Range   Sodium 137 135 - 145 mmol/L   Potassium 4.4 3.5 - 5.1 mmol/L   Chloride 106 98 - 111  mmol/L   CO2 23 22 - 32 mmol/L   Glucose, Bld 109 (H) 70 - 99 mg/dL    Comment: Glucose reference range applies only to samples taken after fasting for at least 8 hours.   BUN 23 8 - 23 mg/dL   Creatinine, Ser 1.61 0.61 - 1.24 mg/dL   Calcium 8.7 (L) 8.9 - 10.3 mg/dL   Total Protein 7.4 6.5 - 8.1 g/dL   Albumin 4.0 3.5 - 5.0 g/dL   AST 35 15 - 41 U/L   ALT 28 0 - 44 U/L   Alkaline Phosphatase 63 38 - 126 U/L   Total Bilirubin 1.5 (H) 0.3 - 1.2 mg/dL   GFR, Estimated >09 >60 mL/min    Comment: (NOTE) Calculated using the CKD-EPI Creatinine Equation (2021)    Anion gap 8 5 - 15    Comment: Performed at [facility], [address] [place], [place] [address]  CBC     Status: Abnormal   Collection Time: 07/14/22  2:26 PM  Result Value Ref Range   WBC 13.6 (H) 4.0 - 10.5 K/uL   RBC 4.19 (L) 4.22 - 5.81 MIL/uL   Hemoglobin 12.7 (L) 13.0 - 17.0 g/dL   HCT 81.1 91.4 - 78.2 %   MCV 93.8 80.0 - 100.0 fL   MCH 30.3 26.0 - 34.0 pg   MCHC 32.3 30.0 - 36.0 g/dL   RDW 95.6 21.3 - 08.6 %   Platelets 310 150 - 400 K/uL   nRBC 0.0 0.0 - 0.2 %    Comment: Performed at [facility], [address] [place], [place] [address]  Type and screen [facility]     Status: None   Collection Time: 07/14/22  2:26 PM  Result Value Ref Range   ABO/RH(D) O POS    Antibody Screen NEG    Sample Expiration      07/17/2022,2359 Performed at [facility], [address] [place], [place] [address]   Protime-INR     Status: None   Collection Time: 07/14/22  4:56 PM  Result Value Ref Range   Prothrombin Time 15.0 11.4 - 15.2 seconds   INR 1.2 0.8 - 1.2    Comment: (NOTE) INR goal varies based on device and disease states. Performed at [facility], [address] [place], [place] [address]    No results found.  Pending Labs [employer] (  From admission, onward)     Start     Ordered   07/15/22 0500  Basic metabolic panel  Tomorrow  morning,   STAT        07/14/22 1757   07/15/22 0500  CBC  Tomorrow morning,   STAT        07/14/22 1757            Vitals/Pain Today's Vitals   07/14/22 1815 07/14/22 1900 07/14/22 1920 07/14/22 1930  BP: 129/62 124/72  (!) 118/59  Pulse: 82 89  83  Resp: 14 20  (!) 21  Temp:      TempSrc:      SpO2: 99% 100%  98%  Weight:      Height:      PainSc:   0-No pain     Isolation Precautions No active isolations  Medications Medications  acetaminophen (TYLENOL) tablet 650 mg (has no administration in time range)    Or  acetaminophen (TYLENOL) suppository 650 mg (has no administration in time range)  ondansetron (ZOFRAN) tablet 4 mg (has no administration in time range)    Or  ondansetron (ZOFRAN) injection 4 mg (has no administration in time range)  senna-docusate (Senokot-S) tablet 1 tablet (has no administration in time range)  allopurinol (ZYLOPRIM) tablet 300 mg (has no administration in time range)  rosuvastatin (CRESTOR) tablet 10 mg (has no administration in time range)  0.9 %  sodium chloride infusion ( Intravenous New Bag/Given 07/14/22 1703)    Mobility walks     Focused Assessments Cardiac Assessment Handoff:  Cardiac Rhythm: Normal sinus rhythm  Does the Patient currently have chest pain? No    R Recommendations: See Admitting Provider Note  Report given to:   Additional Notes:  Clear liquid diet

## 2022-07-15 ENCOUNTER — Inpatient Hospital Stay: Payer: Medicare Other

## 2022-07-15 DIAGNOSIS — K922 Gastrointestinal hemorrhage, unspecified: Secondary | ICD-10-CM

## 2022-07-15 HISTORY — DX: Gastrointestinal hemorrhage, unspecified: K92.2

## 2022-07-15 LAB — BASIC METABOLIC PANEL
Anion gap: 6 (ref 5–15)
BUN: 19 mg/dL (ref 8–23)
CO2: 24 mmol/L (ref 22–32)
Calcium: 8.2 mg/dL — ABNORMAL LOW (ref 8.9–10.3)
Chloride: 108 mmol/L (ref 98–111)
Creatinine, Ser: 0.89 mg/dL (ref 0.61–1.24)
GFR, Estimated: >60 mL/min (ref >60)
Glucose, Bld: 91 mg/dL (ref 70–99)
Potassium: 3.7 mmol/L (ref 3.5–5.1)
Sodium: 138 mmol/L (ref 135–145)

## 2022-07-15 LAB — HEMOGLOBIN AND HEMATOCRIT, BLOOD
HCT: 31.8 % — ABNORMAL LOW (ref 39.0–52.0)
HCT: 31.5 % — ABNORMAL LOW (ref 39.0–52.0)
Hemoglobin: 10.4 g/dL — ABNORMAL LOW (ref 13.0–17.0)
Hemoglobin: 10.3 g/dL — ABNORMAL LOW (ref 13.0–17.0)

## 2022-07-15 LAB — CBC
HCT: 30.3 % — ABNORMAL LOW (ref 39.0–52.0)
Hemoglobin: 9.9 g/dL — ABNORMAL LOW (ref 13.0–17.0)
MCH: 30.7 pg (ref 26.0–34.0)
MCHC: 32.7 g/dL (ref 30.0–36.0)
MCV: 93.8 fL (ref 80.0–100.0)
Platelets: 239 10*3/uL (ref 150–400)
RBC: 3.23 MIL/uL — ABNORMAL LOW (ref 4.22–5.81)
RDW: 14.1 % (ref 11.5–15.5)
WBC: 10.5 10*3/uL (ref 4.0–10.5)
nRBC: 0 % (ref 0.0–0.2)

## 2022-07-15 MED ORDER — PANTOPRAZOLE SODIUM 40 MG IV SOLR
40.0000 mg | Freq: Two times a day (BID) | INTRAVENOUS | Status: DC
  Administered 2022-07-15 – 2022-07-17 (×4): 40 mg via INTRAVENOUS
  Filled 2022-07-15 (×5): qty 10

## 2022-07-15 MED ORDER — POLYETHYLENE GLYCOL 3350 17 GM/SCOOP PO POWD
1.0000 | Freq: Once | ORAL | Status: AC
  Administered 2022-07-16: 255 g via ORAL
  Filled 2022-07-15: qty 255

## 2022-07-15 MED ORDER — SODIUM CHLORIDE 0.9 % IV SOLN: INTRAVENOUS | Status: DC

## 2022-07-15 NOTE — Progress Notes (Signed)
  Progress Note   Patient: Phillip Nelson ZOX:[id number] DOB: 12/26/1934 DOA: 07/14/2022     1 DOS: the patient was seen and examined on 07/15/2022   Brief hospital course: [name] is a 88 year old male with history of hemorrhoids, upper GI bleed, lower GI bleed, colon cancer status post hemicolectomy with primary anastomosis in July 2023, CAD, hyperlipidemia, OSA on CPAP, myalgia, who presented on 07/14/2022 for evaluation of rectal bleeding.  ED course -- normal vitals except HR 110. Labs notable for Hbg 12.7, WBC 13.6k.  T. bili 1.5.  NM tagged RBC scan was ordered by EDP. Pt was admitted to medicine service with GI consulted.  Assessment and Plan:  * Acute lower GI bleeding Melena - pt reported bloody stools were very dark in color, almost black.  --Mgmt per GI - follow up recommendations --Clear liquids, diet per GI --IV PPI BID for now --Follow up pending NM bleed scan --Avoid all NSAID's & anticoagulants --SCD's for DVT prophylaxis  AVM (arteriovenous malformation) of duodenum, acquired Possible source of GI bleeding / melena. Mgmt as outlined and per GI.  Anemia Likely acute blood loss given melena/rectal bleeding.  Hbg on admission was 12.7. Hbg 9.9 this AM --Trend Hbg, transfuse if < 7 or if <8 with active bleeding --Pt consents to blood transfusion if needed  OSA (obstructive sleep apnea) Does not use CPAP. Counseled on importance of CPAP.  Hyperlipidemia with target LDL less than 70 Continue statin  Coronary artery disease involving native coronary artery of native heart without angina pectoris Stable, no chest pain. Continue statin.   Appears not on ASA or beta blocker.        Subjective: Pt awake resting in bed when seen this AM.  He denies further bloody/dark stools overnight or this AM.  Reports he had been on iron but had stopped it so he could tell if his stools turned dark from bleeding.  Denies CP, SOB or other acute  complaints.  Physical Exam: Vitals:   07/15/22 0820 07/15/22 1321 07/15/22 1338 07/15/22 1354  BP: 114/65 120/64 (!) 128/57 126/62  Pulse: 78     Resp: 15     Temp: 98.2 F (36.8 C)     TempSrc:      SpO2: 100%     Weight:      Height:       General exam: awake, alert, no acute distress HEENT: moist mucus membranes, hearing grossly normal  Respiratory system: CTAB no wheezes, rales or rhonchi, normal respiratory effort. Cardiovascular system: normal S1/S2, RRR, no pedal edema.   Gastrointestinal system: soft, NT, ND, no HSM felt, +bowel sounds. Central nervous system: A&O x4. no gross focal neurologic deficits, normal speech Extremities: moves all , no edema, normal tone Skin: dry, intact, normal temperature, normal color, No rashes, lesions or ulcers Psychiatry: normal mood, congruent affect, judgement and insight appear normal   Data Reviewed:  Notable labs --- Hbg 12.7 >> 9.9,   Family Communication: None, pt declined me to call family, states he will update.  Disposition: Status is: Inpatient Remains inpatient appropriate because: ongoing evaluation of GI bleeding with acute anemia   Planned Discharge Destination: Home    Time spent: 42 minutes  Author: Pennie Banter, DO 07/15/2022 1:59 PM  For on call review [email or web address].

## 2022-07-15 NOTE — Consult Note (Signed)
Arlyss Repress, MD [address]  [address]  [place], [place] [address]  Main: [phone]  Fax: [phone] Pager: [phone]   Consultation  Referring Provider:     No ref. provider found Primary Care Physician:  Dana Allan, MD Primary Gastroenterologist:  Dr.   Lannette Donath      Reason for Consultation: Rectal bleeding, acute blood loss anemia Date of Admission:  07/14/2022 Date of Consultation:  07/15/2022         HPI:   Phillip Nelson is a 88 y.o. male with history of hypertension, hyperlipidemia, history of CABG, history of iron deficiency anemia secondary to small bowel AVMs and adenocarcinoma of the transverse colon s/p APC of the small bowel AVMs in July 2023, right hemicolectomy in July 2023 with primary anastomosis.  He presented to ER yesterday afternoon because he was noticing rectal bleeding since Thursday night.  He reports that he had a very hard bowel movement that required manual disimpaction on Thursday, the stool was about size of a golf ball.  This was followed by several episodes of rectal bleeding, dark in color.  Today, he reports passing gas with blood staining only.  He was undergoing bleeding scan when I saw him on the table.  He denies any abdominal pain, nausea or vomiting.  Vitals have been stable, hemoglobin 9.9 dropped from 12.7 since admission.  Last normal was 13.9 a month ago.  Normal BUN/creatinine.  Patient was seen by me in the office in 02/2022 for rectal bleeding and his hemoglobin was normal.  He is due for his surveillance colonoscopy.  NSAIDs: None  Antiplts/Anticoagulants/Anti thrombotics: None  GI Procedures:  EGD and colonoscopy 08/24/2021   - Two non-bleeding angioectasias in the duodenum. Treated with argon plasma coagulation (APC). - Normal stomach. Biopsied. - Normal gastroesophageal junction and esophagus.   - Likely malignant tumor in the transverse colon. Biopsied. Tattooed. - One 5 mm polyp in the transverse  colon, removed with a cold snare. Resected and retrieved. - Diverticulosis in the recto-sigmoid colon and in the sigmoid colon. - Non-bleeding external hemorrhoids.   DIAGNOSIS:  A. STOMACH; COLD BIOPSY:  - UNREMARKABLE GASTRIC MUCOSA.  - NEGATIVE FOR H. PYLORI, INTESTINAL METAPLASIA, DYSPLASIA, AND  MALIGNANCY.   B. COLON MASS, TRANSVERSE; COLD BIOPSY:  - INVASIVE COLORECTAL ADENOCARCINOMA, MODERATELY DIFFERENTIATED.   C.  COLON POLYP, TRANSVERSE; COLD SNARE:  - TUBULAR ADENOMA.  - FRAGMENTS OF COLONIC MUCOSA WITH INTRAMUCOSAL ADENOCARCINOMA,  MORPHOLOGICALLY SIMILAR TO SPECIMEN B, SEE COMMENT.   Comment:  Clinical and endoscopic correlation are required to determine whether  the fragments of intramucosal adenocarcinoma seen in the transverse  colon polyp specimen could be a contaminant from the transverse colon  mass biopsy.    Colonoscopy in 2018 - Diverticulosis in the sigmoid colon and in the descending colon. - One 5 mm polyp in the rectum, removed with a hot snare. Resected and retrieved. - One 3 mm polyp in the ascending colon, removed with a cold biopsy forceps. Resected and retrieved. - The examination was otherwise normal. DIAGNOSIS:  A. RECTUM POLYP; HOT SNARE:  - HYPERPLASTIC POLYP.  - NEGATIVE FOR DYSPLASIA AND MALIGNANCY.   B. COLON POLYP, ASCENDING; COLD BIOPSY:  - TUBULAR ADENOMA.  - NEGATIVE FOR HIGH-GRADE DYSPLASIA AND MALIGNANCY.   Past Medical History:  Diagnosis Date   Adenocarcinoma of prostate 05/08/2012   a.) Gleason = 3+3 = 6; PSA = 8.26; volume = 57cc; b.) s/p brachytherapy   Adenocarcinoma of  transverse colon 08/24/2021   a.) Bx (+) for moderately differentiated adenocarcinoma   Aortic atherosclerosis    Aural vertigo    AVM (arteriovenous malformation) of duodenum, acquired    Basal cell carcinoma 12/31/2006   L mid infraclavicular. Excised 02/27/2007, margins free.   Basal cell carcinoma 10/30/2016    R post shoulder. Nodular pattern.    Basal cell carcinoma 03/31/2020   L lat pretibial - ED&C    Coronary artery disease involving native coronary artery of native heart with other form of angina pectoris 09/2003   a.) followed by [facility] Gwen Pounds); transitioned to [facility] Herbie Baltimore) 06/2021; b.) NSTEMI 09/2003 (Sx= heaviness in chest ~100lb brick, no SOB); LHC --> 95% oLAD with diffuse 50% stenosis -> referred to CVTS; c.) s/p CABG x 1 (LIMA-LAD) 10/19/2003; d.) Non-Ischemic Cardiolite 07/2016   Daily headache 09/22/2019   Diastolic dysfunction    a.) TTE 08/04/2014: EF 45%, mild global HK, mild LVH, mild MAC, mild TR/PR; b.) TTE 08/10/2016: EF 50%, mild LVH, mild MAC, mild MR/TR/PR; c.) TTE 07/29/2021: ED 55-60%, G1DD, Ao sclerosis with no stenosis.   Diverticulitis    DOE (dyspnea on exertion)    DOE (dyspnea on exertion) 07/12/2021   ED (erectile dysfunction)    Frequent headaches    GI bleed 11/20/2018   Goals of care, counseling/discussion 08/31/2021   Gout, arthritis    Hemorrhoids    History of heart attack 09/2003   History of lower GI bleeding 03/02/2015   History of prostate cancer 05/08/2012   Adenocarcinoma,gleason=3+3=6,PSA=8.26,vol=57cc  75 seeds treatment   HTN (hypertension)    Hyperlipidemia    Hypotension due to hypovolemia    IDA (iron deficiency anemia)    Myalgia due to statin    NSTEMI (non-ST elevated myocardial infarction) 10/17/2003   a.) LHC 10/18/2003 --> 95% oLAD with multiple 50% sequential lesions --> referred to CVTS. b.) CABG x 1 (LIMA-LAD) 10/19/2003.   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 surgery.    BILATERAL CARPAL TUNNEL RELEASE Right 06/28/2001   Right (endoscopic)   CARPECTOMY Right    wrist   CATARACT EXTRACTION W/ INTRAOCULAR LENS  IMPLANT, BILATERAL     COLONOSCOPY WITH PROPOFOL N/A 10/31/2016   Procedure: COLONOSCOPY WITH PROPOFOL;  Surgeon: Kieth Brightly, MD;  Location: [facility] ENDOSCOPY;  Service: Endoscopy;  Laterality: N/A;   COLONOSCOPY WITH PROPOFOL N/A 08/24/2021   Procedure: COLONOSCOPY WITH PROPOFOL;  Surgeon: Toney Reil, MD;  Location: [facility] ENDOSCOPY;  Service: Gastroenterology;  Laterality: N/A;   CORONARY ARTERY BYPASS GRAFT  10/19/2003   Procedure: CORONARY ARTERY BYPASS GRAFT; Locaation: [facility]; Surgeon:  Carlyon Prows, IV, MD   DORSAL COMPARTMENT RELEASE Left 06/25/2017   Procedure:  LEFT WRIST TENDON SHEATH RELEASE;  Surgeon: Mack Hook, MD;  Location: [facility];  Service: Orthopedics;  Laterality: Left;   EMBOLIZATION N/A 11/21/2018   Procedure: EMBOLIZATION (Colonic);  Surgeon: Renford Dills, MD;  Location: [facility] INVASIVE CV LAB;  Service: Cardiovascular;  Laterality: N/A;   ESOPHAGOGASTRODUODENOSCOPY (EGD) WITH PROPOFOL N/A 08/24/2021   Procedure: ESOPHAGOGASTRODUODENOSCOPY (EGD) WITH PROPOFOL;  Surgeon: Toney Reil, MD;  Location: [facility] ENDOSCOPY;  Service: Gastroenterology;  Laterality: N/A;   FOOT SURGERY Bilateral    GOUT   KNEE ARTHROSCOPY Bilateral 1986   Right: 1986; left 09/06/2009   LAPAROSCOPIC RIGHT COLECTOMY N/A 10/11/2021   Procedure: LAPAROSCOPIC RIGHT COLECTOMY-RNFA to assist Converted to open procedure;  Surgeon: Leafy Ro, MD;  Location: [facility] ORS;  Service: General;  Laterality: N/A;  Provider is requesting 3 hours(180  minutes) for this case   LEFT ELBOW SURGERY  AGE 61  &  2007   LEFT HEART CATH AND CORONARY ANGIOGRAPHY  10/18/2003   [facility] (non-STEMI): Ostial LAD 95% with multiple sequential 50% stenoses. => Referred for CABG   NM GATED MYOCARDIAL STUDY (ARMX HX)  07/31/2016   Treadmill Myoview/Cardiolite : [facility]): 4.6 METS, 92 % MPHR.  EF 55 to 60%.  Normal wall motion.  No ischemia or infarction.   OLECRANON BURSA EXCISION Right 06/28/2001   OLECRANON BURSA EXCISION Left 06/24/2007   RADIOACTIVE SEED IMPLANT N/A 08/02/2012   Procedure: RADIOACTIVE SEED IMPLANT;  Surgeon: Garnett Farm, MD;  Location: [facility];  Service: Urology;  Laterality: N/A;   TOTAL KNEE ARTHROPLASTY Bilateral 04/14/2004   RIGHT 2006; LEFT 11/21/2010   TRANSTHORACIC ECHOCARDIOGRAM  07/31/2016   ([facility]) normal LV function.  Mild LVH.  EF 50%.  Normal RV.  Mild MR, TR and PR.  No stenosis.     Current Facility-Administered Medications:    acetaminophen (TYLENOL) tablet 650 mg, 650 mg, Oral, Q6H PRN **OR** acetaminophen (TYLENOL) suppository 650 mg, 650 mg, Rectal, Q6H PRN, Cox, Amy N, DO   allopurinol (ZYLOPRIM) tablet 300 mg, 300 mg, Oral, Daily, Cox, Amy N, DO, 300 mg at 07/15/22 0811   ondansetron (ZOFRAN) tablet 4 mg, 4 mg, Oral, Q6H PRN **OR** ondansetron (ZOFRAN) injection 4 mg, 4 mg, Intravenous, Q6H PRN, Cox, Amy N, DO   pantoprazole (PROTONIX) injection 40 mg, 40 mg, Intravenous, Q12H, Griffith, Kelly A, DO   rosuvastatin (CRESTOR) tablet 10 mg, 10 mg, Oral, QHS, Cox, Amy N, DO   senna-docusate (Senokot-S) tablet 1 tablet, 1 tablet, Oral, QHS PRN, Cox, Amy N, DO   Family History  Problem Relation Age of Onset   Cerebral aneurysm Mother    Alzheimer's disease Father    Lung cancer Sister 58       treated surgically   Cancer - Lung Sister      Social History   Tobacco Use   Smoking status: Former    Packs/day: 3.00    Years: 31.00    Additional pack years: 0.00     Total pack years: 93.00    Types: Cigarettes    Quit date: 08/01/1987    Years since quitting: 34.9   Smokeless tobacco: Never  Vaping Use   Vaping Use: Never used  Substance Use Topics   Alcohol use: Not Currently    Comment: once a year on NYE   Drug use: No    Allergies as of 07/14/2022 - Review Complete 07/14/2022  Allergen Reaction Noted   Indocin [indomethacin] Other (See Comments)  06/05/2012   Iodinated contrast media Hives 10/19/2021   Penicillins Itching, Rash, and Other (See Comments) 06/05/2012    Review of Systems:    All systems reviewed and negative except where noted in HPI.   Physical Exam:  Vital signs in last 24 hours: Temp:  [97.5 F (36.4 C)-98.2 F (36.8 C)] 98.2 F (36.8 C) (04/20 0820) Pulse Rate:  [70-89] 78 (04/20 0820) Resp:  [12-21] 15 (04/20 0820) BP: (114-143)/(57-109) 129/61 (04/20 1500) SpO2:  [96 %-100 %] 100 % (04/20 0820) FiO2 (%):  [21 %] 21 % (04/19 2320) Weight:  [95.7 kg] 95.7 kg (04/19 2105) Last BM Date : 07/15/22 General:   Pleasant, cooperative in NAD Head:  Normocephalic and atraumatic. Eyes:   No icterus.   Conjunctiva pink. PERRLA. Ears:  Normal auditory acuity. Neck:  Supple; no masses or thyroidomegaly Lungs: Respirations even and unlabored. Lungs clear to auscultation bilaterally.   No wheezes, crackles, or rhonchi.  Heart:  Regular rate and rhythm;  Without murmur, clicks, rubs or gallops Abdomen:  Soft, nondistended, nontender. Normal bowel sounds. No appreciable masses or hepatomegaly.  No rebound or guarding.  Rectal:  Not performed. Msk:  Symmetrical without gross deformities.  Strength normal Extremities:  Without edema, cyanosis or clubbing. Neurologic:  Alert and oriented x3;  grossly normal neurologically. Skin:  Intact without significant lesions or rashes. Psych:  Alert and cooperative. Normal affect.  LAB RESULTS:    Latest Ref Rng & Units 07/15/2022    4:28 AM 07/14/2022    2:26 PM 06/12/2022    3:11 PM   CBC  WBC 4.0 - 10.5 K/uL 10.5  13.6  10.5   Hemoglobin 13.0 - 17.0 g/dL 9.9  29.5  62.1   Hematocrit 39.0 - 52.0 % 30.3  39.3  42.3   Platelets 150 - 400 K/uL 239  310  285.0     BMET    Latest Ref Rng & Units 07/15/2022    4:28 AM 07/14/2022    2:26 PM 03/08/2022    6:42 PM  BMP  Glucose 70 - 99 mg/dL 91  308  90   BUN 8 - 23 mg/dL 19  23  21    Creatinine 0.61 - 1.24 mg/dL 6.57  8.46  9.62   Sodium 135 - 145 mmol/L 138  137  138   Potassium 3.5 - 5.1 mmol/L 3.7  4.4  4.1   Chloride 98 - 111 mmol/L 108  106  107   CO2 22 - 32 mmol/L 24  23  23    Calcium 8.9 - 10.3 mg/dL 8.2  8.7  9.0     LFT    Latest Ref Rng & Units 07/14/2022    2:26 PM 03/08/2022    6:42 PM 12/16/2021   10:16 AM  Hepatic Function  Total Protein 6.5 - 8.1 g/dL 7.4  7.7  7.8   Albumin 3.5 - 5.0 g/dL 4.0  4.1  4.1   AST 15 - 41 U/L 35  30  25   ALT 0 - 44 U/L 28  30  21    Alk Phosphatase 38 - 126 U/L 63  64  68   Total Bilirubin 0.3 - 1.2 mg/dL 1.5  1.1  1.2   Bilirubin, Direct 0.0 - 0.2 mg/dL   0.2      STUDIES: No results found.    Impression / Plan:   Phillip Nelson is a 88 y.o. male with history of hypertension, hyperlipidemia, history of CABG, history  of iron deficiency anemia secondary to small bowel AVMs and adenocarcinoma of the transverse colon s/p APC of the small bowel AVMs in July 2023, right hemicolectomy in July 2023 with primary anastomosis.  Patient has been doing well and his iron deficiency anemia has resolved.  He now presents with 1 day history of painless rectal bleeding after fecal disimpaction.  Likely diverticular bleed given acute blood loss anemia.  Will proceed with colonoscopy pending nuclear medicine bleeding scan results.  If negative, recommend clear liquid diet and bowel prep with tentative plan for colonoscopy tomorrow based on the bowel prep Monitor CBC closely to maintain hemoglobin above 8  I have discussed alternative options, risks & benefits,  which include,  but are not limited to, bleeding, infection, perforation,respiratory complication & drug reaction.  The patient agrees with this plan & written consent will be obtained.     Thank you for involving me in the care of this patient.      LOS: 1 day   Lannette Donath, MD  07/15/2022, 3:09 PM    Note: This dictation was prepared with Dragon dictation along with smaller phrase technology. Any transcriptional errors that result from this process are unintentional.

## 2022-07-15 NOTE — Assessment & Plan Note (Signed)
Possible source of GI bleeding / melena. Mgmt as outlined and per GI.

## 2022-07-16 ENCOUNTER — Inpatient Hospital Stay: Payer: Medicare Other | Admitting: Anesthesiology

## 2022-07-16 ENCOUNTER — Encounter: Payer: Self-pay | Admitting: Internal Medicine

## 2022-07-16 ENCOUNTER — Encounter
Admission: EM | Disposition: A | Discharge status: Home / Self Care | Payer: Self-pay | Source: Home / Self Care | Attending: Internal Medicine

## 2022-07-16 DIAGNOSIS — K922 Gastrointestinal hemorrhage, unspecified: Secondary | ICD-10-CM | POA: Diagnosis not present

## 2022-07-16 DIAGNOSIS — D72829 Elevated white blood cell count, unspecified: Secondary | ICD-10-CM | POA: Diagnosis present

## 2022-07-16 DIAGNOSIS — K5730 Diverticulosis of large intestine without perforation or abscess without bleeding: Secondary | ICD-10-CM | POA: Diagnosis present

## 2022-07-16 HISTORY — PX: COLONOSCOPY WITH PROPOFOL: SHX5780

## 2022-07-16 LAB — CBC
HCT: 33 % — ABNORMAL LOW (ref 39.0–52.0)
Hemoglobin: 10.7 g/dL — ABNORMAL LOW (ref 13.0–17.0)
MCH: 30.2 pg (ref 26.0–34.0)
MCHC: 32.4 g/dL (ref 30.0–36.0)
MCV: 93.2 fL (ref 80.0–100.0)
Platelets: 278 10*3/uL (ref 150–400)
RBC: 3.54 MIL/uL — ABNORMAL LOW (ref 4.22–5.81)
RDW: 14.2 % (ref 11.5–15.5)
WBC: 14.2 10*3/uL — ABNORMAL HIGH (ref 4.0–10.5)
nRBC: 0 % (ref 0.0–0.2)

## 2022-07-16 LAB — MAGNESIUM: Magnesium: 2.1 mg/dL (ref 1.7–2.4)

## 2022-07-16 LAB — HEMOGLOBIN AND HEMATOCRIT, BLOOD
HCT: 30 % — ABNORMAL LOW (ref 39.0–52.0)
Hemoglobin: 9.7 g/dL — ABNORMAL LOW (ref 13.0–17.0)

## 2022-07-16 LAB — BASIC METABOLIC PANEL
Anion gap: 10 (ref 5–15)
BUN: 15 mg/dL (ref 8–23)
CO2: 21 mmol/L — ABNORMAL LOW (ref 22–32)
Calcium: 8.7 mg/dL — ABNORMAL LOW (ref 8.9–10.3)
Chloride: 106 mmol/L (ref 98–111)
Creatinine, Ser: 1.11 mg/dL (ref 0.61–1.24)
GFR, Estimated: >60 mL/min (ref >60)
Glucose, Bld: 105 mg/dL — ABNORMAL HIGH (ref 70–99)
Potassium: 3.9 mmol/L (ref 3.5–5.1)
Sodium: 137 mmol/L (ref 135–145)

## 2022-07-16 LAB — IRON AND TIBC
Iron: 19 ug/dL — ABNORMAL LOW (ref 45–182)
Saturation Ratios: 5 % — ABNORMAL LOW (ref 17.9–39.5)
TIBC: 392 ug/dL (ref 250–450)
UIBC: 373 ug/dL

## 2022-07-16 LAB — FERRITIN: Ferritin: 15 ng/mL — ABNORMAL LOW (ref 24–336)

## 2022-07-16 LAB — GLUCOSE, CAPILLARY: Glucose-Capillary: 125 mg/dL — ABNORMAL HIGH (ref 70–99)

## 2022-07-16 SURGERY — COLONOSCOPY WITH PROPOFOL
Anesthesia: General

## 2022-07-16 MED ORDER — LACTATED RINGERS IV SOLN: INTRAVENOUS | Status: DC

## 2022-07-16 MED ORDER — VASOPRESSIN 20 UNIT/ML IV SOLN
INTRAVENOUS | Status: DC | PRN
  Administered 2022-07-16 (×2): 1.5 [IU] via INTRAVENOUS
  Administered 2022-07-16: 2.5 [IU] via INTRAVENOUS

## 2022-07-16 MED ORDER — PHENYLEPHRINE 80 MCG/ML (10ML) SYRINGE FOR IV PUSH (FOR BLOOD PRESSURE SUPPORT)
PREFILLED_SYRINGE | INTRAVENOUS | Status: DC | PRN
  Administered 2022-07-16 (×3): 120 ug via INTRAVENOUS

## 2022-07-16 MED ORDER — SODIUM CHLORIDE 0.9 % IV SOLN
300.0000 mg | Freq: Once | INTRAVENOUS | Status: AC
  Administered 2022-07-16: 300 mg via INTRAVENOUS
  Filled 2022-07-16: qty 300

## 2022-07-16 MED ORDER — PROPOFOL 10 MG/ML IV BOLUS
INTRAVENOUS | Status: AC
  Filled 2022-07-16: qty 20

## 2022-07-16 MED ORDER — PROPOFOL 10 MG/ML IV BOLUS
INTRAVENOUS | Status: DC | PRN
  Administered 2022-07-16: 90 mg via INTRAVENOUS
  Administered 2022-07-16 (×4): 30 mg via INTRAVENOUS

## 2022-07-16 MED ORDER — TECHNETIUM TC 99M-LABELED RED BLOOD CELLS IV KIT
25.7800 | PACK | Freq: Once | INTRAVENOUS | Status: AC | PRN
  Administered 2022-07-15: 25.78 via INTRAVENOUS

## 2022-07-16 MED ORDER — PROPOFOL 10 MG/ML IV BOLUS
INTRAVENOUS | Status: AC
  Filled 2022-07-16: qty 40

## 2022-07-16 NOTE — Assessment & Plan Note (Signed)
Suspect reactive in setting of bleeding. No fevers or s/sx's of infection. Defer antibiotics. --Monitor CBC --Monitor clinically for s/sx's of infection

## 2022-07-16 NOTE — Progress Notes (Addendum)
Progress Note   Patient: MENA LIENAU ZOX:[id number] DOB: 1934/06/03 DOA: 07/14/2022     2 DOS: the patient was seen and examined on 07/16/2022   Brief hospital course: [name] is a 88 year old male with history of hemorrhoids, upper GI bleed, lower GI bleed, colon cancer status post hemicolectomy with primary anastomosis in July 2023, CAD, hyperlipidemia, OSA on CPAP, myalgia, who presented on 07/14/2022 for evaluation of rectal bleeding.  ED course -- normal vitals except HR 110. Labs notable for Hbg 12.7, WBC 13.6k.  T. bili 1.5.  NM tagged RBC scan was ordered by EDP. Pt was admitted to medicine service with GI consulted.  Assessment and Plan:  * Acute lower GI bleeding Melena - pt reported bloody stools were very dark in color, almost black.  NM bleed scan 4/20 -- negative. Colonoscopy 4/21 - no active bleeding, suspect this was diverticular bleed. --Mgmt per GI - follow up recommendations --Diet resumed --IV PPI BID for now --Avoid all NSAID's & anticoagulants --SCD's for DVT prophylaxis  Diverticular disease of right side of colon Presumed source of bleeding per GI Mgmt as outlined  AVM (arteriovenous malformation) of duodenum, acquired Possible source of GI bleeding / melena. Mgmt as outlined and per GI.  Anemia Iron deficiency Likely acute blood loss given melena/rectal bleeding.  Hbg on admission was 12.7. Hbg nadir 9.9 >> 10.3 >> 10.7 this AM --Trend Hbg, transfuse if < 7 or if <8 with active bleeding --IV iron infusion today --Pt consents to blood transfusion if needed  OSA (obstructive sleep apnea) Does not use CPAP. Counseled on importance of CPAP.  Hyperlipidemia with target LDL less than 70 Continue statin  Coronary artery disease involving native coronary artery of native heart without angina pectoris Stable, no chest pain. Continue statin.   Appears not on ASA or beta blocker.        Subjective: Pt awake resting in bed  when seen this AM.  He had another bloody BM in bedside commode earlier, still present in the bucket appearing reddish and odor of GI bleed.  He tolerated bowel prep.  Seen before colonoscopy today which did not reveal active bleeding.  Physical Exam: Vitals:   07/16/22 1225 07/16/22 1230 07/16/22 1249 07/16/22 1330  BP: 90/65 104/61 106/65 (!) 118/51  Pulse: 74 82 74 74  Resp: Temp: (!) 97 F (36.1 C)   97.8 F (36.6 C)  TempSrc:    Oral  SpO2: 100% 99% 100% 94%  Weight:      Height:       General exam: awake, alert, no acute distress HEENT: moist mucus membranes, hearing grossly normal  Respiratory system: CTAB no wheezes, rales or rhonchi, normal respiratory effort. Cardiovascular system: normal S1/S2, RRR, no pedal edema.   Gastrointestinal system: soft, NT, ND, no HSM felt, +bowel sounds. Central nervous system: A&O x4. no gross focal neurologic deficits, normal speech Extremities: moves all , no edema, normal tone Skin: dry, intact, normal temperature, normal color, No rashes, lesions or ulcers Psychiatry: normal mood, congruent affect, judgement and insight appear normal   Data Reviewed:  Notable labs --- Hbg trend 12.7 >> 9.9 >> 10.4 >> 10.3 >> 10.7  WBC 10.5 >> 14.2 Iron 19 low, normal TIBC, low sat ratio 5%, low ferritin 15   Procedures: Colonoscopy 07/16/22 - no active bleeding seen.  Bleeding presumed diverticular source. Impression: - The examined portion of the ileum was normal. - Patent end-to-side colo-colonic anastomosis, characterized by  healthy appearing mucosa and visible sutures. - Diverticulosis in the transverse colon. - Non-bleeding external hemorrhoids.    Family Communication: None, pt declined me to call family, states he will update.  Disposition: Status is: Inpatient Remains inpatient appropriate because: ongoing evaluation of GI bleeding with acute anemia   Planned Discharge Destination: Home    Time spent: 38  minutes  Author: Pennie Banter, DO 07/16/2022 3:45 PM  For on call review [email or web address].

## 2022-07-16 NOTE — Assessment & Plan Note (Signed)
Presumed source of bleeding per GI Mgmt as outlined

## 2022-07-16 NOTE — Anesthesia Postprocedure Evaluation (Signed)
Anesthesia Post Note  Patient: Phillip Nelson  Procedure(s) Performed: COLONOSCOPY WITH PROPOFOL  Patient location during evaluation: Endoscopy Anesthesia Type: General Level of consciousness: awake and alert Pain management: pain level controlled Vital Signs Assessment: post-procedure vital signs reviewed and stable Respiratory status: spontaneous breathing, nonlabored ventilation, respiratory function stable and patient connected to nasal cannula oxygen Cardiovascular status: blood pressure returned to baseline and stable Postop Assessment: no apparent nausea or vomiting Anesthetic complications: no   No notable events documented.   Last Vitals:  Vitals:   07/16/22 1249 07/16/22 1330  BP: 106/65 (!) 118/51  Pulse: 74 74  Resp: 18 18  Temp:  36.6 C  SpO2: 100% 94%    Last Pain:  Vitals:   07/16/22 1330  TempSrc: Oral  PainSc:                  Louie Boston

## 2022-07-16 NOTE — Transfer of Care (Signed)
Immediate Anesthesia Transfer of Care Note  Patient: Phillip Nelson  Procedure(s) Performed: COLONOSCOPY WITH PROPOFOL  Patient Location: PACU  Anesthesia Type:General  Level of Consciousness: drowsy  Airway & Oxygen Therapy: Patient Spontanous Breathing and Patient connected to nasal cannula oxygen  Post-op Assessment: Report given to RN, Post -op Vital signs reviewed and stable, and Patient moving all extremities  Post vital signs: Reviewed and stable  Last Vitals:  Vitals Value Taken Time  BP 92/67 07/16/22 1204  Temp 36.1 C 07/16/22 1158  Pulse 68 07/16/22 1204  Resp 11 07/16/22 1204  SpO2 95 % 07/16/22 1204  Vitals shown include unvalidated device data.  Last Pain:  Vitals:   07/16/22 1158  TempSrc:   PainSc: Asleep         Complications: No notable events documented.

## 2022-07-16 NOTE — Anesthesia Preprocedure Evaluation (Addendum)
Anesthesia Evaluation  Patient identified by MRN, date of birth, ID band Patient awake    Reviewed: Allergy & Precautions, NPO status , Patient's Chart, lab work & pertinent test results  History of Anesthesia Complications Negative for: history of anesthetic complications  Airway Mallampati: III  TM Distance: >3 FB Neck ROM: full    Dental  (+) Upper Dentures, Missing   Pulmonary sleep apnea , former smoker   Pulmonary exam normal        Cardiovascular hypertension, On Medications + angina  + CAD, + Past MI, + CABG, +CHF (diastolic dysfunction) and + DOE  Normal cardiovascular exam+ dysrhythmias      Neuro/Psych  Headaches  negative psych ROS   GI/Hepatic Neg liver ROS, PUD,,,  Endo/Other  negative endocrine ROS    Renal/GU negative Renal ROS  negative genitourinary   Musculoskeletal   Abdominal   Peds  Hematology  (+) Blood dyscrasia, anemia   Anesthesia Other Findings Past Medical History: 05/08/2012: Adenocarcinoma of prostate     Comment:  a.) Gleason = 3+3 = 6; PSA = 8.26; volume = 57cc; b.)               s/p brachytherapy 08/24/2021: Adenocarcinoma of transverse colon     Comment:  a.) Bx (+) for moderately differentiated adenocarcinoma No date: Aortic atherosclerosis No date: Aural vertigo No date: AVM (arteriovenous malformation) of duodenum, acquired 12/31/2006: Basal cell carcinoma     Comment:  L mid infraclavicular. Excised 02/27/2007, margins free. 10/30/2016: Basal cell carcinoma     Comment:   R post shoulder. Nodular pattern. 03/31/2020: Basal cell carcinoma     Comment:  L lat pretibial - ED&C  09/2003: Coronary artery disease involving native coronary artery of  native heart with other form of angina pectoris     Comment:  a.) followed by [facility] Gwen Pounds); transitioned              to [facility] Herbie Baltimore) 06/2021; b.) NSTEMI 09/2003               (Sx= heaviness in chest ~100lb  brick, no SOB); LHC -->               95% oLAD with diffuse 50% stenosis -> referred to CVTS;               c.) s/p CABG x 1 (LIMA-LAD) 10/19/2003; d.) Non-Ischemic               Cardiolite 07/2016 09/22/2019: Daily headache No date: Diastolic dysfunction     Comment:  a.) TTE 08/04/2014: EF 45%, mild global HK, mild LVH,               mild MAC, mild TR/PR; b.) TTE 08/10/2016: EF 50%, mild               LVH, mild MAC, mild MR/TR/PR; c.) TTE 07/29/2021: ED               55-60%, G1DD, Ao sclerosis with no stenosis. No date: Diverticulitis No date: DOE (dyspnea on exertion) 07/12/2021: DOE (dyspnea on exertion) No date: ED (erectile dysfunction) No date: Frequent headaches 11/20/2018: GI bleed 08/31/2021: Goals of care, counseling/discussion No date: Gout, arthritis No date: Hemorrhoids 09/2003: History of heart attack 03/02/2015: History of lower GI bleeding 05/08/2012: History of prostate cancer     Comment:  Adenocarcinoma,gleason=3+3=6,PSA=8.26,vol=57cc  75 seeds              treatment No  date: HTN (hypertension) No date: Hyperlipidemia No date: Hypotension due to hypovolemia No date: IDA (iron deficiency anemia) No date: Myalgia due to statin 10/17/2003: NSTEMI (non-ST elevated myocardial infarction)     Comment:  a.) LHC 10/18/2003 --> 95% oLAD with multiple 50%               sequential lesions --> referred to CVTS. b.) CABG x 1               (LIMA-LAD) 10/19/2003. No date: Nuclear cataract, nonsenile No date: OSA (obstructive sleep apnea)     Comment:  a.) non-compliant with prescibed nocturnal PAP therapy;               unable to tolerate No date: Prediabetes No date: RBBB (right bundle branch block) with left posterior  fascicular block 10/19/2003: S/P CABG x 1     Comment:  a.) s/p CABG x 1 (LIMA-LAD) at [facility] No date: Squamous cell carcinoma, arm, left     Comment:  L forearm prox dorsum  No date: Squamous cell carcinoma, arm, left     Comment:  L forearm prox  medial  08/23/2017: Squamous cell carcinoma, arm, right     Comment:  R mid volar forearm No date: Stable angina 06/22/2016: Stable angina pectoris No date: Tubular adenoma of colon  Past Surgical History: 1999: APPENDECTOMY     Comment:  Life was saved with emerg. surgery.  06/28/2001: BILATERAL CARPAL TUNNEL RELEASE; Right     Comment:  Right (endoscopic) No date: CARPECTOMY; Right     Comment:  wrist No date: CATARACT EXTRACTION W/ INTRAOCULAR LENS  IMPLANT, BILATERAL 10/31/2016: COLONOSCOPY WITH PROPOFOL; N/A     Comment:  Procedure: COLONOSCOPY WITH PROPOFOL;  Surgeon: Kieth Brightly, MD;  Location: [facility] ENDOSCOPY;  Service:               Endoscopy;  Laterality: N/A; 08/24/2021: COLONOSCOPY WITH PROPOFOL; N/A     Comment:  Procedure: COLONOSCOPY WITH PROPOFOL;  Surgeon: Toney Reil, MD;  Location: [facility] ENDOSCOPY;  Service:               Gastroenterology;  Laterality: N/A; 10/19/2003: CORONARY ARTERY BYPASS GRAFT     Comment:  Procedure: CORONARY ARTERY BYPASS GRAFT; Locaation:               [facility]; Surgeon:  Carlyon Prows, IV, MD 06/25/2017: DORSAL COMPARTMENT RELEASE; Left     Comment:  Procedure: LEFT WRIST TENDON SHEATH RELEASE;  Surgeon:               Mack Hook, MD;  Location: [facility];  Service: Orthopedics;  Laterality: Left; 11/21/2018: EMBOLIZATION; N/A     Comment:  Procedure: EMBOLIZATION (Colonic);  Surgeon: Renford Dills, MD;  Location: [facility] INVASIVE CV LAB;  Service:              Cardiovascular;  Laterality: N/A; 08/24/2021: ESOPHAGOGASTRODUODENOSCOPY (EGD) WITH PROPOFOL; N/A     Comment:  Procedure: ESOPHAGOGASTRODUODENOSCOPY (EGD) WITH               PROPOFOL;  Surgeon: Toney Reil, MD;  Location:  [facility] ENDOSCOPY;  Service: Gastroenterology;  Laterality:               N/A; No date: FOOT SURGERY; Bilateral     Comment:  GOUT 1986:  KNEE ARTHROSCOPY; Bilateral     Comment:  Right: 1986; left 09/06/2009 10/11/2021: LAPAROSCOPIC RIGHT COLECTOMY; N/A     Comment:  Procedure: LAPAROSCOPIC RIGHT COLECTOMY-RNFA to assist               Converted to open procedure;  Surgeon: Leafy Ro,               MD;  Location: [facility] ORS;  Service: General;  Laterality:               N/A;  Provider is requesting 3 hours(180 minutes) for               this case AGE 65  &  2007: LEFT ELBOW SURGERY 10/18/2003: LEFT HEART CATH AND CORONARY ANGIOGRAPHY     Comment:  [facility] (non-STEMI): Ostial LAD 95% with multiple               sequential 50% stenoses. => Referred for CABG 07/31/2016: NM GATED MYOCARDIAL STUDY (ARMX HX)     Comment:  Treadmill Myoview/Cardiolite : [facility]):               4.6 METS, 92 % MPHR.  EF 55 to 60%.  Normal wall motion.               No ischemia or infarction. 06/28/2001: OLECRANON BURSA EXCISION; Right 06/24/2007: OLECRANON BURSA EXCISION; Left 08/02/2012: RADIOACTIVE SEED IMPLANT; N/A     Comment:  Procedure: RADIOACTIVE SEED IMPLANT;  Surgeon: Garnett Farm, MD;  Location: [facility];                Service: Urology;  Laterality: N/A; 04/14/2004: TOTAL KNEE ARTHROPLASTY; Bilateral     Comment:  RIGHT 2006; LEFT 11/21/2010 07/31/2016: TRANSTHORACIC ECHOCARDIOGRAM     Comment:  ([facility]) normal LV function.  Mild LVH.                EF 50%.  Normal RV.  Mild MR, TR and PR.  No stenosis.  BMI    Body Mass Index: 30.27 kg/m      Reproductive/Obstetrics negative OB ROS                             Anesthesia Physical Anesthesia Plan  ASA: 3  Anesthesia Plan: General   Post-op Pain Management: Minimal or no pain anticipated   Induction: Intravenous  PONV Risk Score and Plan: Propofol infusion and TIVA  Airway Management Planned: Natural Airway and Nasal Cannula  Additional Equipment:   Intra-op Plan:   Post-operative  Plan:   Informed Consent: I have reviewed the patients History and Physical, chart, labs and discussed the procedure including the risks, benefits and alternatives for the proposed anesthesia with the patient or authorized representative who has indicated his/her understanding and acceptance.     Dental Advisory Given  Plan Discussed with: Anesthesiologist, CRNA and Surgeon  Anesthesia Plan Comments: (Patient consented for risks of anesthesia including but not limited to:  - adverse reactions to medications - risk of airway placement if required - damage to eyes, teeth, lips or other oral mucosa - nerve damage due to  positioning  - sore throat or hoarseness - Damage to heart, brain, nerves, lungs, other parts of body or loss of life  Patient voiced understanding.)       Anesthesia Quick Evaluation

## 2022-07-16 NOTE — Op Note (Signed)
[facility] Gastroenterology Patient Name: Phillip Nelson Procedure Date: 07/16/2022 10:29 AM MRN: [id number] Account #: [id number] Date of Birth: Feb 08, 1935 Admit Type: Inpatient Age: 88 Room: [facility] ENDO ROOM 3 Gender: Male Note Status: Finalized Instrument Name: Colonoscope [id number] Procedure:             Colonoscopy Indications:           Last colonoscopy: May 2023, Hematochezia, Acute post                         hemorrhagic anemia Providers:             Toney Reil MD, MD Referring MD:          Dana Allan (Referring MD) Medicines:             General Anesthesia Complications:         No immediate complications. Estimated blood loss: None. Procedure:             Pre-Anesthesia Assessment:                        - Prior to the procedure, a History and Physical was                         performed, and patient medications and allergies were                         reviewed. The patient is competent. The risks and                         benefits of the procedure and the sedation options and                         risks were discussed with the patient. All questions                         were answered and informed consent was obtained.                         Patient identification and proposed procedure were                         verified by the physician, the nurse, the                         anesthesiologist, the anesthetist and the technician                         in the pre-procedure area in the procedure room in the                         endoscopy suite. Mental Status Examination: alert and                         oriented. Airway Examination: normal oropharyngeal                         airway and neck mobility. Respiratory Examination:  clear to auscultation. CV Examination: normal.                         Prophylactic Antibiotics: The patient does not require                         prophylactic antibiotics.  Prior Anticoagulants: The                         patient has taken no anticoagulant or antiplatelet                         agents. ASA Grade Assessment: III - A patient with                         severe systemic disease. After reviewing the risks and                         benefits, the patient was deemed in satisfactory                         condition to undergo the procedure. The anesthesia                         plan was to use general anesthesia. Immediately prior                         to administration of medications, the patient was                         re-assessed for adequacy to receive sedatives. The                         heart rate, respiratory rate, oxygen saturations,                         blood pressure, adequacy of pulmonary ventilation, and                         response to care were monitored throughout the                         procedure. The physical status of the patient was                         re-assessed after the procedure.                        After obtaining informed consent, the colonoscope was                         passed under direct vision. Throughout the procedure,                         the patient's blood pressure, pulse, and oxygen                         saturations were monitored continuously. The  Colonoscope was introduced through the anus and                         advanced to the the ileocolonic anastomosis. The                         colonoscopy was performed without difficulty. The                         patient tolerated the procedure well. The quality of                         the bowel preparation was good. The terminal ileum,                         ileocecal valve, appendiceal orifice, and rectum were                         photographed. Findings:      The perianal and digital rectal examinations were normal. Pertinent       negatives include normal sphincter tone and no palpable rectal  lesions.      The neo-terminal ileum appeared normal.      No active bleeding, pinkish liquid in the entire colon      There was evidence of a prior end-to-side colo-colonic anastomosis in       the ascending colon. This was patent and was characterized by healthy       appearing mucosa and visible sutures. The anastomosis was traversed.      Multiple large-mouthed diverticula were found in the transverse colon,       source of hematochezia.      Non-bleeding external hemorrhoids were found during retroflexion. The       hemorrhoids were small. Impression:            - The examined portion of the ileum was normal.                        - Patent end-to-side colo-colonic anastomosis,                         characterized by healthy appearing mucosa and visible                         sutures.                        - Diverticulosis in the transverse colon.                        - Non-bleeding external hemorrhoids.                        - No specimens collected. Recommendation:        - Return patient to hospital ward for possible                         discharge same day.                        - Resume regular diet today.                        -  Continue present medications. Procedure Code(s):     --- Professional ---                        [phone], Colonoscopy, flexible; diagnostic, including                         collection of specimen(s) by brushing or washing, when                         performed (separate procedure) Diagnosis Code(s):     --- Professional ---                        K64.4, Residual hemorrhoidal skin tags                        Z98.0, Intestinal bypass and anastomosis status                        K92.1, Melena (includes Hematochezia)                        K57.30, Diverticulosis of large intestine without                         perforation or abscess without bleeding                        D62, Acute posthemorrhagic anemia CPT copyright 2022 American  Medical Association. All rights reserved. The codes documented in this report are preliminary and upon coder review may  be revised to meet current compliance requirements. Dr. Libby Maw Toney Reil MD, MD 07/16/2022 11:58:57 AM This report has been signed electronically. Number of Addenda: 0 Note Initiated On: 07/16/2022 10:29 AM Scope Withdrawal Time: 0 hours 10 minutes 24 seconds  Total Procedure Duration: 0 hours 16 minutes 32 seconds  Estimated Blood Loss:  Estimated blood loss: none.      [facility]

## 2022-07-17 ENCOUNTER — Encounter: Payer: Self-pay | Admitting: Gastroenterology

## 2022-07-17 DIAGNOSIS — K922 Gastrointestinal hemorrhage, unspecified: Secondary | ICD-10-CM | POA: Diagnosis not present

## 2022-07-17 LAB — CBC
HCT: 31.3 % — ABNORMAL LOW (ref 39.0–52.0)
Hemoglobin: 10.2 g/dL — ABNORMAL LOW (ref 13.0–17.0)
MCH: 30.4 pg (ref 26.0–34.0)
MCHC: 32.6 g/dL (ref 30.0–36.0)
MCV: 93.2 fL (ref 80.0–100.0)
Platelets: 260 10*3/uL (ref 150–400)
RBC: 3.36 MIL/uL — ABNORMAL LOW (ref 4.22–5.81)
RDW: 14.4 % (ref 11.5–15.5)
WBC: 10.6 10*3/uL — ABNORMAL HIGH (ref 4.0–10.5)
nRBC: 0 % (ref 0.0–0.2)

## 2022-07-17 LAB — HEMOGLOBIN AND HEMATOCRIT, BLOOD
HCT: 30.5 % — ABNORMAL LOW (ref 39.0–52.0)
Hemoglobin: 9.9 g/dL — ABNORMAL LOW (ref 13.0–17.0)

## 2022-07-17 LAB — VITAMIN B12: Vitamin B-12: 236 pg/mL (ref 180–914)

## 2022-07-17 NOTE — Discharge Summary (Signed)
Physician Discharge Summary   Patient: Phillip Nelson MRN: [id number] DOB: 07-02-1934  Admit date:     07/14/2022  Discharge date: 07/18/22  Discharge Physician: Pennie Banter   PCP: Dana Allan, MD   Recommendations at discharge:   Follow up with Primary Care in 1-2 weeks Repeat CBC, BMP, Mg in 1-2 weeks Recommend repeat iron studies at follow up and further supplementation if appropriate.  Give iron infusion during admission.   Discharge Diagnoses: Active Problems:   Coronary artery disease involving native coronary artery of native heart without angina pectoris   Hyperlipidemia with target LDL less than 70   OSA (obstructive sleep apnea)   Anemia   AVM (arteriovenous malformation) of duodenum, acquired   Diverticular disease of right side of colon   Leukocytosis  Principal Problem (Resolved):   Acute lower GI bleeding Resolved Problems:   Lower GI bleed  Hospital Course: Phillip Nelson is a 88 year old male with history of hemorrhoids, upper GI bleed, lower GI bleed, colon cancer status post hemicolectomy with primary anastomosis in July 2023, CAD, hyperlipidemia, OSA on CPAP, myalgia, who presented on 07/14/2022 for evaluation of rectal bleeding.  ED course -- normal vitals except HR 110. Labs notable for Hbg 12.7, WBC 13.6k.  T. bili 1.5.  NM tagged RBC scan was ordered by EDP. Pt was admitted to medicine service with GI consulted.  Further hospital course and management as outlined below:  Assessment and Plan: * Acute lower GI bleeding-resolved as of 07/18/2022 Melena - pt reported bloody stools were very dark in color, almost black.  NM bleed scan 4/20 -- negative. Colonoscopy 4/21 - no active bleeding, suspect this was diverticular bleed. --Mgmt per GI - follow up recommendations --Diet resumed --IV PPI BID for now --Avoid all NSAID's & anticoagulants --SCD's for DVT prophylaxis  Leukocytosis Suspect reactive in setting of bleeding. No fevers  or s/sx's of infection. Defer antibiotics. --Monitor CBC --Monitor clinically for s/sx's of infection  Diverticular disease of right side of colon Presumed source of bleeding per GI Mgmt as outlined  AVM (arteriovenous malformation) of duodenum, acquired Possible source of GI bleeding / melena. Mgmt as outlined and per GI.  Anemia Iron deficiency Likely acute blood loss given melena/rectal bleeding.  Hbg on admission was 12.7. Hbg nadir 9.9 >> 10.3 >> 10.7 this AM --Trend Hbg, transfuse if < 7 or if <8 with active bleeding --IV iron infusion today --Pt consents to blood transfusion if needed  OSA (obstructive sleep apnea) Does not use CPAP. Counseled on importance of CPAP.  Hyperlipidemia with target LDL less than 70 Continue statin  Coronary artery disease involving native coronary artery of native heart without angina pectoris Stable, no chest pain. Continue statin.   Appears not on ASA or beta blocker.         Consultants: GI Procedures performed: Colonoscopy  Disposition: Home Diet recommendation:  Regular diet DISCHARGE MEDICATION: Allergies as of 07/17/2022       Reactions   Indocin [indomethacin] Other (See Comments)   Shakes. tremors   Iodinated Contrast Media Hives   Penicillins Itching, Rash, Other (See Comments)   Did it involve swelling of the face/tongue/throat, SOB, or low BP? Unknown Did it involve sudden or severe rash/hives, skin peeling, or any reaction on the inside of your mouth or nose? Unknown Did you need to seek medical attention at a hospital or doctor's office? Unknown When did it last happen? unknown If all above answers are "NO", may proceed with cephalosporin  use.        Medication List     STOP taking these medications    rosuvastatin 20 MG tablet Commonly known as: CRESTOR   saccharomyces boulardii 250 MG capsule Commonly known as: Florastor       TAKE these medications    allopurinol 300 MG tablet Commonly  known as: ZYLOPRIM Take 1 tablet (300 mg total) by mouth daily.   Potassium 99 MG Tabs Take 1 tablet by mouth daily as needed (leg cramps).        Discharge Exam: Filed Weights   07/14/22 1424 07/14/22 2105  Weight: 91.2 kg 95.7 kg   General exam: awake, alert, no acute distress HEENT: atraumatic, clear conjunctiva, anicteric sclera, moist mucus membranes, hearing grossly normal  Respiratory system: CTAB, no wheezes, rales or rhonchi, normal respiratory effort. Cardiovascular system: normal S1/S2,  RRR, no JVD, murmurs, rubs, gallops,  no pedal edema.   Gastrointestinal system: soft, NT, ND, no HSM felt, +bowel sounds. Central nervous system: A&O x 4. no gross focal neurologic deficits, normal speech Extremities: moves all , no edema, normal tone Skin: dry, intact, normal temperature, normal color, No rashes, lesions or ulcers Psychiatry: normal mood, congruent affect, judgement and insight appear normal   Condition at discharge: stable  The results of significant diagnostics from this hospitalization (including imaging, microbiology, ancillary and laboratory) are listed below for reference.   Imaging Studies: NM GI Blood Loss  Result Date: 07/15/2022 CLINICAL DATA:  GI blood loss. History colon cancer and prostate cancer. EXAM: NUCLEAR MEDICINE GASTROINTESTINAL BLEEDING SCAN TECHNIQUE: Sequential abdominal images were obtained following intravenous administration of Tc-20m labeled red blood cells. RADIOPHARMACEUTICALS:  25.78 mCi Tc-37m pertechnetate in-vitro labeled red cells. COMPARISON:  CT scan 10/19/2021 FINDINGS: No active GI bleed is identified. IMPRESSION: Negative nuclear medicine bleeding scan for active blood loss. Electronically Signed   By: Rudie Meyer M.D.   On: 07/15/2022 15:30    Microbiology: Results for orders placed or performed during the hospital encounter of 10/11/21  MRSA Next Gen by PCR, Nasal     Status: None   Collection Time: 10/11/21  6:00 PM    Specimen: Nasal Mucosa; Nasal Swab  Result Value Ref Range Status   MRSA by PCR Next Gen NOT DETECTED NOT DETECTED Final    Comment: (NOTE) The GeneXpert MRSA Assay (FDA approved for NASAL specimens only), is one component of a comprehensive MRSA colonization surveillance program. It is not intended to diagnose MRSA infection nor to guide or monitor treatment for MRSA infections. Test performance is not FDA approved in patients less than 33 years old. Performed at [facility], [address] [place], [place] [address]     Labs: CBC: Recent Labs  Lab 07/14/22 1426 07/15/22 0428 07/15/22 1536 07/15/22 2256 07/16/22 0707 07/16/22 1601 07/17/22 0000 07/17/22 0851  WBC 13.6* 10.5  --   --  14.2*  --   --  10.6*  HGB 12.7* 9.9*   < > 10.3* 10.7* 9.7* 9.9* 10.2*  HCT 39.3 30.3*   < > 31.5* 33.0* 30.0* 30.5* 31.3*  MCV 93.8 93.8  --   --  93.2  --   --  93.2  PLT 310 239  --   --  278  --   --  260   < > = values in this interval not displayed.   Basic Metabolic Panel: Recent Labs  Lab 07/14/22 1426 07/15/22 0428 07/16/22 0707  NA 137 138 137  K 4.4 3.7  3.9  CL 106 108 106  CO2 23 24 21*  GLUCOSE 109* 91 105*  BUN 23 19 15   CREATININE 1.14 0.89 1.11  CALCIUM 8.7* 8.2* 8.7*  MG  --   --  2.1   Liver Function Tests: Recent Labs  Lab 07/14/22 1426  AST 35  ALT 28  ALKPHOS 63  BILITOT 1.5*  PROT 7.4  ALBUMIN 4.0   CBG: Recent Labs  Lab 07/16/22 0310  GLUCAP 125*    Discharge time spent: less than 30 minutes.  Signed: Pennie Banter, DO [facility] 07/18/2022

## 2022-07-17 NOTE — Progress Notes (Signed)
Patient notified of discharge, discharge materials and teaching provided to patient, all questions and concerns addressed at this time. Iv removed with no complications. Belongings gathered with and by patient and taken with him upon discharge. Patient escorted off of unit by volunteer service member.

## 2022-07-18 ENCOUNTER — Telehealth: Payer: Self-pay

## 2022-07-18 ENCOUNTER — Encounter: Payer: Self-pay | Admitting: Internal Medicine

## 2022-07-18 ENCOUNTER — Telehealth: Payer: Self-pay | Admitting: *Deleted

## 2022-07-18 DIAGNOSIS — E538 Deficiency of other specified B group vitamins: Secondary | ICD-10-CM

## 2022-07-18 DIAGNOSIS — D509 Iron deficiency anemia, unspecified: Secondary | ICD-10-CM

## 2022-07-18 MED ORDER — FUSION PLUS PO CAPS: 1.0000 | ORAL_CAPSULE | ORAL | 0 refills | Status: DC

## 2022-07-18 NOTE — Telephone Encounter (Signed)
Patient verbalized understanding of results. He will start taking vitamin b12 and sent iron pill to the pharmacy. Put a reminder for 3 months

## 2022-07-18 NOTE — Transitions of Care (Post Inpatient/ED Visit) (Signed)
   07/18/2022  Name: Phillip Nelson MRN: [id number] DOB: October 18, 1934  Today's TOC FU Call Status: Today's TOC FU Call Status:: Unsuccessul Call (1st Attempt) Unsuccessful Call (1st Attempt) Date: 07/18/22  Attempted to reach the patient regarding the most recent Inpatient/ED visit.  Follow Up Plan: Additional outreach attempts will be made to reach the patient to complete the Transitions of Care (Post Inpatient/ED visit) call.  Gean Maidens BSN RN [facility] [phone]

## 2022-07-18 NOTE — Telephone Encounter (Signed)
-----   Message from Toney Reil, MD sent at 07/17/2022  3:52 PM EDT ----- Please inform patient that his iron levels are very low and low normal B12 levels.  Recommend to take B12 500 units daily as well as fusion plus every other day for 3 months  Recheck CBC, iron panel, B12 levels in 3 months unless he develops rectal bleeding again, then he should call our office  RV

## 2022-07-19 ENCOUNTER — Telehealth: Payer: Self-pay | Admitting: *Deleted

## 2022-07-19 NOTE — Transitions of Care (Post Inpatient/ED Visit) (Signed)
   07/19/2022  Name: Phillip Nelson MRN: [id number] DOB: 02/16/1935  Today's TOC FU Call Status: Today's TOC FU Call Status:: Successful TOC FU Call Competed TOC FU Call Complete Date: 07/19/22  Transition Care Management Follow-up Telephone Call Date of Discharge: 07/17/22 Discharge Facility: [facility]) Type of Discharge: Inpatient Admission Primary Inpatient Discharge Diagnosis:: acute gi bleed How have you been since you were released from the hospital?: Better Any questions or concerns?: No  Items Reviewed: Did you receive and understand the discharge instructions provided?: Yes Medications obtained and verified?: Yes (Medications Reviewed) Any new allergies since your discharge?: No Dietary orders reviewed?: No Do you have support at home?: Yes People in Home: spouse Name of Support/Comfort Primary Source: spouse and son chris  Home Care and Equipment/Supplies: Were Home Health Services Ordered?: No Any new equipment or medical supplies ordered?: No  Functional Questionnaire: Do you need assistance with bathing/showering or dressing?: No Do you need assistance with meal preparation?: No Do you need assistance with eating?: No Do you have difficulty maintaining continence: No Do you need assistance with getting out of bed/getting out of a chair/moving?: No Do you have difficulty managing or taking your medications?: No  Follow up appointments reviewed: PCP Follow-up appointment confirmed?: No MD Provider Line Number:[phone] Given: Yes (patient refused follow up appointment) Specialist Hospital Follow-up appointment confirmed?: NA Do you need transportation to your follow-up appointment?: No Do you understand care options if your condition(s) worsen?: Yes-patient verbalized understanding  SDOH Interventions Today    Flowsheet Row Most Recent Value  SDOH Interventions   Food Insecurity Interventions Intervention Not Indicated   Housing Interventions Intervention Not Indicated  Transportation Interventions Intervention Not Indicated      Interventions Today    Flowsheet Row Most Recent Value  General Interventions   General Interventions Discussed/Reviewed General Interventions Discussed, General Interventions Reviewed, Doctor Visits  Doctor Visits Discussed/Reviewed Doctor Visits Discussed, Doctor Visits Reviewed  [Patient refused follow up Dr visit. RN explained that blood work needed to be drawn for further supplementation as per Hospitalist note and patient refused.]      TOC Interventions Today    Flowsheet Row Most Recent Value  TOC Interventions   TOC Interventions Discussed/Reviewed TOC Interventions Discussed, TOC Interventions Reviewed        Gean Maidens BSN RN [facility] [phone]

## 2022-08-26 DIAGNOSIS — R1084 Generalized abdominal pain: Secondary | ICD-10-CM | POA: Diagnosis not present

## 2022-08-26 DIAGNOSIS — I10 Essential (primary) hypertension: Secondary | ICD-10-CM | POA: Diagnosis not present

## 2022-08-26 DIAGNOSIS — R1111 Vomiting without nausea: Secondary | ICD-10-CM | POA: Diagnosis not present

## 2022-08-27 ENCOUNTER — Inpatient Hospital Stay
Admission: EM | Admit: 2022-08-27 | Discharge: 2022-08-29 | DRG: 419 | Disposition: A | Discharge status: Home / Self Care | Payer: Medicare Other | Attending: Osteopathic Medicine | Admitting: Osteopathic Medicine

## 2022-08-27 ENCOUNTER — Emergency Department: Payer: Medicare Other

## 2022-08-27 ENCOUNTER — Encounter: Payer: Self-pay | Admitting: Medical Oncology

## 2022-08-27 DIAGNOSIS — I10 Essential (primary) hypertension: Secondary | ICD-10-CM | POA: Diagnosis present

## 2022-08-27 DIAGNOSIS — R0902 Hypoxemia: Secondary | ICD-10-CM | POA: Diagnosis present

## 2022-08-27 DIAGNOSIS — M109 Gout, unspecified: Secondary | ICD-10-CM | POA: Diagnosis present

## 2022-08-27 DIAGNOSIS — Z96653 Presence of artificial knee joint, bilateral: Secondary | ICD-10-CM | POA: Diagnosis present

## 2022-08-27 DIAGNOSIS — Z91199 Patient's noncompliance with other medical treatment and regimen due to unspecified reason: Secondary | ICD-10-CM

## 2022-08-27 DIAGNOSIS — Z801 Family history of malignant neoplasm of trachea, bronchus and lung: Secondary | ICD-10-CM | POA: Diagnosis not present

## 2022-08-27 DIAGNOSIS — Z880 Allergy status to penicillin: Secondary | ICD-10-CM

## 2022-08-27 DIAGNOSIS — D509 Iron deficiency anemia, unspecified: Secondary | ICD-10-CM | POA: Diagnosis present

## 2022-08-27 DIAGNOSIS — Z820 Family history of epilepsy and other diseases of the nervous system: Secondary | ICD-10-CM | POA: Diagnosis not present

## 2022-08-27 DIAGNOSIS — Z8546 Personal history of malignant neoplasm of prostate: Secondary | ICD-10-CM

## 2022-08-27 DIAGNOSIS — D519 Vitamin B12 deficiency anemia, unspecified: Secondary | ICD-10-CM | POA: Diagnosis present

## 2022-08-27 DIAGNOSIS — K82A1 Gangrene of gallbladder in cholecystitis: Secondary | ICD-10-CM | POA: Diagnosis present

## 2022-08-27 DIAGNOSIS — N281 Cyst of kidney, acquired: Secondary | ICD-10-CM | POA: Diagnosis not present

## 2022-08-27 DIAGNOSIS — G4733 Obstructive sleep apnea (adult) (pediatric): Secondary | ICD-10-CM | POA: Diagnosis not present

## 2022-08-27 DIAGNOSIS — I252 Old myocardial infarction: Secondary | ICD-10-CM | POA: Diagnosis not present

## 2022-08-27 DIAGNOSIS — R079 Chest pain, unspecified: Secondary | ICD-10-CM | POA: Diagnosis not present

## 2022-08-27 DIAGNOSIS — Z85038 Personal history of other malignant neoplasm of large intestine: Secondary | ICD-10-CM

## 2022-08-27 DIAGNOSIS — E785 Hyperlipidemia, unspecified: Secondary | ICD-10-CM | POA: Diagnosis not present

## 2022-08-27 DIAGNOSIS — Z87891 Personal history of nicotine dependence: Secondary | ICD-10-CM

## 2022-08-27 DIAGNOSIS — I739 Peripheral vascular disease, unspecified: Secondary | ICD-10-CM | POA: Diagnosis not present

## 2022-08-27 DIAGNOSIS — Z79899 Other long term (current) drug therapy: Secondary | ICD-10-CM

## 2022-08-27 DIAGNOSIS — R1011 Right upper quadrant pain: Secondary | ICD-10-CM | POA: Diagnosis not present

## 2022-08-27 DIAGNOSIS — Z85828 Personal history of other malignant neoplasm of skin: Secondary | ICD-10-CM

## 2022-08-27 DIAGNOSIS — K5730 Diverticulosis of large intestine without perforation or abscess without bleeding: Secondary | ICD-10-CM | POA: Diagnosis not present

## 2022-08-27 DIAGNOSIS — Z886 Allergy status to analgesic agent status: Secondary | ICD-10-CM

## 2022-08-27 DIAGNOSIS — Z951 Presence of aortocoronary bypass graft: Secondary | ICD-10-CM

## 2022-08-27 DIAGNOSIS — N200 Calculus of kidney: Secondary | ICD-10-CM | POA: Diagnosis not present

## 2022-08-27 DIAGNOSIS — K810 Acute cholecystitis: Secondary | ICD-10-CM | POA: Diagnosis not present

## 2022-08-27 DIAGNOSIS — I2510 Atherosclerotic heart disease of native coronary artery without angina pectoris: Secondary | ICD-10-CM | POA: Diagnosis present

## 2022-08-27 DIAGNOSIS — D649 Anemia, unspecified: Secondary | ICD-10-CM | POA: Diagnosis present

## 2022-08-27 DIAGNOSIS — Z91041 Radiographic dye allergy status: Secondary | ICD-10-CM | POA: Diagnosis not present

## 2022-08-27 LAB — COMPREHENSIVE METABOLIC PANEL
ALT: 28 U/L (ref 0–44)
AST: 28 U/L (ref 15–41)
Albumin: 4.5 g/dL (ref 3.5–5.0)
Alkaline Phosphatase: 74 U/L (ref 38–126)
Anion gap: 7 (ref 5–15)
BUN: 13 mg/dL (ref 8–23)
CO2: 26 mmol/L (ref 22–32)
Calcium: 9 mg/dL (ref 8.9–10.3)
Chloride: 101 mmol/L (ref 98–111)
Creatinine, Ser: 0.99 mg/dL (ref 0.61–1.24)
GFR, Estimated: >60 mL/min (ref >60)
Glucose, Bld: 108 mg/dL — ABNORMAL HIGH (ref 70–99)
Potassium: 3.9 mmol/L (ref 3.5–5.1)
Sodium: 134 mmol/L — ABNORMAL LOW (ref 135–145)
Total Bilirubin: 1.2 mg/dL (ref 0.3–1.2)
Total Protein: 7.9 g/dL (ref 6.5–8.1)

## 2022-08-27 LAB — CBC
HCT: 44.3 % (ref 39.0–52.0)
Hemoglobin: 14.8 g/dL (ref 13.0–17.0)
MCH: 32 pg (ref 26.0–34.0)
MCHC: 33.4 g/dL (ref 30.0–36.0)
MCV: 95.9 fL (ref 80.0–100.0)
Platelets: 323 10*3/uL (ref 150–400)
RBC: 4.62 MIL/uL (ref 4.22–5.81)
RDW: 13.9 % (ref 11.5–15.5)
WBC: 18.9 10*3/uL — ABNORMAL HIGH (ref 4.0–10.5)
nRBC: 0 % (ref 0.0–0.2)

## 2022-08-27 LAB — URINALYSIS, ROUTINE W REFLEX MICROSCOPIC
Bilirubin Urine: NEGATIVE
Glucose, UA: NEGATIVE mg/dL
Hgb urine dipstick: NEGATIVE
Ketones, ur: NEGATIVE mg/dL
Leukocytes,Ua: NEGATIVE
Nitrite: NEGATIVE
Protein, ur: NEGATIVE mg/dL
Specific Gravity, Urine: 1.008 (ref 1.005–1.030)
pH: 6 (ref 5.0–8.0)

## 2022-08-27 LAB — LIPASE, BLOOD: Lipase: 32 U/L (ref 11–51)

## 2022-08-27 MED ORDER — SODIUM CHLORIDE 0.9 % IV SOLN
2.0000 g | Freq: Once | INTRAVENOUS | Status: AC
  Administered 2022-08-27: 2 g via INTRAVENOUS
  Filled 2022-08-27: qty 20

## 2022-08-27 MED ORDER — KETOROLAC TROMETHAMINE 15 MG/ML IJ SOLN
10.0000 mg | Freq: Once | INTRAMUSCULAR | Status: AC
  Administered 2022-08-27: 10 mg via INTRAVENOUS
  Filled 2022-08-27: qty 1

## 2022-08-27 MED ORDER — FENTANYL CITRATE PF 50 MCG/ML IJ SOSY
50.0000 ug | PREFILLED_SYRINGE | INTRAMUSCULAR | Status: DC | PRN
  Administered 2022-08-27 – 2022-08-28 (×4): 50 ug via INTRAVENOUS
  Filled 2022-08-27 (×4): qty 1

## 2022-08-27 MED ORDER — METRONIDAZOLE 500 MG/100ML IV SOLN
500.0000 mg | Freq: Once | INTRAVENOUS | Status: AC
  Administered 2022-08-27: 500 mg via INTRAVENOUS
  Filled 2022-08-27 (×2): qty 100

## 2022-08-27 NOTE — ED Triage Notes (Signed)
Pt reports that he began yesterday having rt upper abd pain. Pt reports minimal nausea. Pain is sharp and stabbing. Does not radiate.

## 2022-08-27 NOTE — ED Provider Notes (Signed)
[facility] Provider Note    Event Date/Time   First MD Initiated Contact with Patient 08/27/22 1958     (approximate)   History   Chief Complaint: Abdominal Pain   HPI  Phillip Nelson is a 88 y.o. male with a past history of CAD, gout, hypertension who comes ED complaining of right upper quadrant abdominal pain that started yesterday.  Associated with nausea.  Severe, 8/10 in intensity.  Nonradiating.  No aggravating or alleviating factors.  No chest pain or shortness of breath, no fever.  No cough.  Not exertional, not pleuritic.     Physical Exam   Triage Vital Signs: ED Triage Vitals  Enc Vitals Group     BP 08/27/22 1923 (!) 142/73     Pulse Rate 08/27/22 1923 100     Resp 08/27/22 1923 18     Temp 08/27/22 1923 98 F (36.7 C)     Temp Source 08/27/22 1923 Oral     SpO2 08/27/22 1923 (!) 88 %     Weight 08/27/22 1924 200 lb (90.7 kg)     Height 08/27/22 1924 5\' 10"  (1.778 m)     Head Circumference --      Peak Flow --      Pain Score 08/27/22 1924 8     Pain Loc --      Pain Edu? --      Excl. in GC? --     Most recent vital signs: Vitals:   08/27/22 2325 08/27/22 2357  BP: (!) 142/69   Pulse: 81   Resp: 20   Temp:  98 F (36.7 C)  SpO2: 98%     General: Awake, no distress.  CV:  Good peripheral perfusion.  Regular rate and rhythm Resp:  Normal effort.  Clear to auscultation bilaterally Abd:  No distention.  Soft with focal right upper quadrant tenderness Other:  Moist oral mucosa, no lower extremity edema   ED Results / Procedures / Treatments   Labs (all labs ordered are listed, but only abnormal results are displayed) Labs Reviewed  COMPREHENSIVE METABOLIC PANEL - Abnormal; Notable for the following components:      Result Value   Sodium 134 (*)    Glucose, Bld 108 (*)    All other components within normal limits  CBC - Abnormal; Notable for the following components:   WBC 18.9 (*)    All other components  within normal limits  URINALYSIS, ROUTINE W REFLEX MICROSCOPIC - Abnormal; Notable for the following components:   Color, Urine YELLOW (*)    APPearance CLEAR (*)    All other components within normal limits  LIPASE, BLOOD  TROPONIN I (HIGH SENSITIVITY)     EKG Interpreted by me Sinus rhythm, rate of 91.  Left axis, right bundle branch block.  No acute ischemic changes.   RADIOLOGY Chest x-ray interpreted by me, appears normal.  Radiology report reviewed.  Ultrasound right upper quadrant shows gallbladder sludge, negative for inflammatory changes  CT abdomen pelvis shows inflammatory stranding around the gallbladder concerning for cholecystitis   PROCEDURES:  Procedures   MEDICATIONS ORDERED IN ED: Medications  metroNIDAZOLE (FLAGYL) IVPB 500 mg (500 mg Intravenous New Bag/Given 08/27/22 2356)  fentaNYL (SUBLIMAZE) injection 50 mcg (50 mcg Intravenous Given 08/27/22 2351)  ketorolac (TORADOL) 15 MG/ML injection 10 mg (10 mg Intravenous Given 08/27/22 2045)  cefTRIAXone (ROCEPHIN) 2 g in sodium chloride 0.9 % 100 mL IVPB (0 g Intravenous Stopped 08/27/22 2348)  IMPRESSION / MDM / ASSESSMENT AND PLAN / ED COURSE  I reviewed the triage vital signs and the nursing notes.  DDx: Cholecystitis, choledocholithiasis, pancreatitis, gastritis, dehydration, electrolyte abnormality, ureterolithiasis, AKI, pneumothorax.  Doubt ACS PE or AAA.  Patient's presentation is most consistent with acute presentation with potential threat to life or bodily function.  Patient presents with right upper quadrant abdominal pain and tenderness.  Vital signs are unremarkable but labs do show leukocytosis of 19,000.  Chest x-ray and ultrasound obtained which were both overall unremarkable.  Patient given Toradol for initial pain control.  Will obtain CT.   Clinical Course as of 08/28/22 0006  Wynelle Link Aug 27, 2022  2349 CT shows inflammatory stranding around the gallbladder concerning for cholecystitis  particularly in the setting of patient's pain and tenderness in the right upper quadrant and leukocytosis of almost 19,000.  No immediate recommendations from general surgery.  Case discussed with hospitalist for further management.  Rocephin and Flagyl ordered for antibiotic coverage due to patient's history of penicillin allergy. [PS]    Clinical Course User Index [PS] Sharman Cheek, MD     FINAL CLINICAL IMPRESSION(S) / ED DIAGNOSES   Final diagnoses:  Acute cholecystitis     Rx / DC Orders   ED Discharge Orders     None        Note:  This document was prepared using Dragon voice recognition software and may include unintentional dictation errors.   Sharman Cheek, MD 08/28/22 [id number]

## 2022-08-27 NOTE — H&P (Signed)
History and Physical    Patient: Phillip Nelson:[id number] DOB: May 15, 1934 DOA: 08/27/2022 DOS: the patient was seen and examined on 08/28/2022 PCP: Dana Allan, MD  Patient coming from: Home  Chief Complaint:  Chief Complaint  Patient presents with   Abdominal Pain   HPI: Phillip Nelson is a 88 y.o. male with medical history significant of GI bleed, colon cancer s/p hemicolectomy with primary anastomosis in 09/2021, HLD, OSA with CPAP presented to ED for abdominal pain.Yesterday, he started having stabbing right upper quadrant abdominal pain with nausea.   Initial ED vitals significant for hypoxia 88% on room air, mildly hypertensive 142/73 otherwise normal.  Patient is afebrile.  He was placed on 2 L nasal cannula with improvement in his oxygen saturation.  Labs significant for leukocytosis (WBC 18.9).  Lipase is normal.  CMP significant for sodium 134, glucose 108 and was otherwise normal.  Urinalysis has not yet been obtained.  Troponin was also ordered but not yet resulted.  Chest x-ray unremarkable, right upper quadrant ultrasound with gallbladder sludge however CT renal showed acute cholecystitis and a nonobstructing left renal stone. He was given Toradol 10 mg. ED provider ordered ceftriaxone and Flagyl in the ED and consulted hospitalist service for evaluation for admission.   Social history: he lives at home with his significant other that has dementia. Denies tobacco, alcohol and elicit drug use.   Review of Systems: As mentioned in the history of present illness. All other systems reviewed and are negative. Past Medical History:  Diagnosis Date   Acute lower GI bleeding 07/14/2022   Adenocarcinoma of prostate (HCC) 05/08/2012   a.) Gleason = 3+3 = 6; PSA = 8.26; volume = 57cc; b.) s/p brachytherapy   Adenocarcinoma of transverse colon (HCC) 08/24/2021   a.) Bx (+) for moderately differentiated adenocarcinoma   Aortic atherosclerosis (HCC)    Aural vertigo    AVM  (arteriovenous malformation) of duodenum, acquired    Basal cell carcinoma 12/31/2006   L mid infraclavicular. Excised 02/27/2007, margins free.   Basal cell carcinoma 10/30/2016    R post shoulder. Nodular pattern.   Basal cell carcinoma 03/31/2020   L lat pretibial - ED&C    Coronary artery disease involving native coronary artery of native heart with other form of angina pectoris (HCC) 09/2003   a.) followed by [facility] Gwen Pounds); transitioned to [facility] Herbie [place]) 06/2021; b.) NSTEMI 09/2003 (Sx= heaviness in chest ~100lb brick, no SOB); LHC --> 95% oLAD with diffuse 50% stenosis -> referred to CVTS; c.) s/p CABG x 1 (LIMA-LAD) 10/19/2003; d.) Non-Ischemic Cardiolite 07/2016   Daily headache 09/22/2019   Diastolic dysfunction    a.) TTE 08/04/2014: EF 45%, mild global HK, mild LVH, mild MAC, mild TR/PR; b.) TTE 08/10/2016: EF 50%, mild LVH, mild MAC, mild MR/TR/PR; c.) TTE 07/29/2021: ED 55-60%, G1DD, Ao sclerosis with no stenosis.   Diverticulitis    DOE (dyspnea on exertion)    DOE (dyspnea on exertion) 07/12/2021   ED (erectile dysfunction)    Frequent headaches    GI bleed 11/20/2018   Goals of care, counseling/discussion 08/31/2021   Gout, arthritis    Hemorrhoids    History of heart attack 09/2003   History of lower GI bleeding 03/02/2015   History of prostate cancer 05/08/2012   Adenocarcinoma,gleason=3+3=6,PSA=8.26,vol=57cc  75 seeds treatment   HTN (hypertension)    Hyperlipidemia    Hypotension due to hypovolemia    IDA (iron deficiency anemia)    Lower GI bleed 07/15/2022  Myalgia due to statin    NSTEMI (non-ST elevated myocardial infarction) (HCC) 10/17/2003   a.) LHC 10/18/2003 --> 95% oLAD with multiple 50% sequential lesions --> referred to CVTS. b.) CABG x 1 (LIMA-LAD) 10/19/2003.   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 surgery.    BILATERAL CARPAL TUNNEL RELEASE Right 06/28/2001   Right (endoscopic)   CARPECTOMY Right    wrist   CATARACT EXTRACTION W/ INTRAOCULAR LENS  IMPLANT, BILATERAL     COLON SURGERY     COLONOSCOPY WITH PROPOFOL N/A 10/31/2016   Procedure: COLONOSCOPY WITH PROPOFOL;  Surgeon: Kieth Brightly, MD;  Location: [facility] ENDOSCOPY;  Service: Endoscopy;  Laterality: N/A;   COLONOSCOPY WITH PROPOFOL N/A 08/24/2021   Procedure: COLONOSCOPY WITH PROPOFOL;  Surgeon: Toney Reil, MD;  Location: [facility] ENDOSCOPY;  Service: Gastroenterology;  Laterality: N/A;   COLONOSCOPY WITH PROPOFOL N/A 07/16/2022   Procedure: COLONOSCOPY WITH PROPOFOL;  Surgeon: Toney Reil, MD;  Location: [facility] ENDOSCOPY;  Service: Gastroenterology;  Laterality: N/A;   CORONARY ARTERY BYPASS GRAFT  10/19/2003   Procedure: CORONARY ARTERY BYPASS GRAFT; Locaation: [facility]; Surgeon:  Carlyon Prows, IV, MD   DORSAL COMPARTMENT RELEASE Left 06/25/2017   Procedure: LEFT WRIST TENDON SHEATH RELEASE;  Surgeon: Mack Hook, MD;  Location: [facility];  Service: Orthopedics;  Laterality: Left;   EMBOLIZATION N/A 11/21/2018   Procedure: EMBOLIZATION (Colonic);  Surgeon: Renford Dills, MD;  Location: [facility] INVASIVE CV LAB;  Service: Cardiovascular;  Laterality: N/A;   ESOPHAGOGASTRODUODENOSCOPY (EGD) WITH PROPOFOL N/A 08/24/2021   Procedure: ESOPHAGOGASTRODUODENOSCOPY (EGD) WITH PROPOFOL;   Surgeon: Toney Reil, MD;  Location: [facility] ENDOSCOPY;  Service: Gastroenterology;  Laterality: N/A;   FOOT SURGERY Bilateral    GOUT   KNEE ARTHROSCOPY Bilateral 1986   Right: 1986; left 09/06/2009   LAPAROSCOPIC RIGHT COLECTOMY N/A 10/11/2021   Procedure: LAPAROSCOPIC RIGHT COLECTOMY-RNFA to assist Converted to open procedure;  Surgeon: Leafy Ro, MD;  Location: [facility] ORS;  Service: General;  Laterality: N/A;  Provider is requesting 3 hours(180 minutes) for this case   LEFT ELBOW SURGERY  AGE 55  &  2007   LEFT HEART CATH AND CORONARY ANGIOGRAPHY  10/18/2003   [facility] (non-STEMI): Ostial LAD 95% with multiple sequential 50% stenoses. => Referred for CABG   NM GATED MYOCARDIAL STUDY (ARMX HX)  07/31/2016   Treadmill Myoview/Cardiolite : [facility]): 4.6 METS, 92 % MPHR.  EF 55 to 60%.  Normal wall motion.  No ischemia or infarction.   OLECRANON BURSA EXCISION Right 06/28/2001   OLECRANON BURSA EXCISION Left 06/24/2007   RADIOACTIVE SEED IMPLANT N/A 08/02/2012   Procedure: RADIOACTIVE SEED IMPLANT;  Surgeon: Garnett Farm, MD;  Location: [facility];  Service: Urology;  Laterality: N/A;   TOTAL KNEE ARTHROPLASTY Bilateral 04/14/2004   RIGHT 2006; LEFT 11/21/2010   TRANSTHORACIC ECHOCARDIOGRAM  07/31/2016   ([facility]) normal LV function.  Mild LVH.  EF 50%.  Normal RV.  Mild MR, TR and PR.  No stenosis.   Social History:  reports that he quit smoking about 35 years ago. His smoking use included cigarettes. He has a 93.00 pack-year smoking history. He has never used smokeless tobacco. He reports that he does not currently use alcohol. He reports that he does not use drugs.  Allergies  Allergen Reactions   Indocin [Indomethacin] Other (See Comments)    Shakes. tremors   Iodinated Contrast Media Hives   Penicillins Itching, Rash and Other (See Comments)    Did it involve swelling of the face/tongue/throat, SOB, or low BP? Unknown Did it involve  sudden or severe rash/hives, skin peeling, or any reaction on the inside of your mouth or nose? Unknown Did you need to seek medical attention at a hospital or doctor's office? Unknown When did it last happen? unknown If all above answers are "NO", may proceed with cephalosporin use.     Family History  Problem Relation Age of Onset   Cerebral aneurysm Mother    Alzheimer's disease Father    Lung cancer Sister 13       treated surgically   Cancer - Lung Sister     Prior to Admission medications   Medication Sig Start Date End Date Taking? Authorizing Provider  allopurinol (ZYLOPRIM) 300 MG tablet Take 1 tablet (300 mg total) by mouth daily. 06/12/22  Yes Dana Allan, MD  Iron-FA-B Cmp-C-Biot-Probiotic (FUSION PLUS) CAPS Take 1 capsule by mouth every other day. 07/18/22  Yes Vanga, Loel Dubonnet, MD  Potassium 99 MG TABS Take 1 tablet by mouth daily as needed (leg cramps).   Yes [provider]    Physical Exam: Vitals:   08/27/22 2030 08/27/22 2200 08/27/22 2325 08/27/22 2357  BP: (!) 142/77 125/68 (!) 142/69   Pulse: 81 78 81   Resp: 11  20   Temp:    98 F (36.7 C)  TempSrc:    Oral  SpO2: 100% 100% 98%   Weight:      Height:       GEN:     alert, elderly male and no distress    HENT:  mucus membranes moist, oropharyngeal without lesions or erythema,  nares patent, no nasal discharge  EYES:   pupils equal and reactive, EOM intact, no discharge or scleral icterus NECK:  supple, good ROM RESP:  clear to auscultation bilaterally, no increased work of breathing, wearing 1L [place]   CVS:   regular rate and rhythm, distal pulses intact   ABD:  soft, epigastric and RUQ tenderness; bowel sounds present; no palpable masses,  no rebound, no guarding EXT:   Good extremity ROM, atraumatic, no edema or contractures  NEURO:   speech normal, alert and oriented x3, gross sensation intact  Skin:   warm and dry,  normal skin turgor Psych: Normal affect, appropriate speech and  behavior   Data Reviewed:  Relevant notes from primary care and specialist visits, past discharge summaries as available in EHR, including Care Everywhere. Prior diagnostic testing as pertinent to current admission diagnoses Updated medications and problem lists for reconciliation ED course, including vitals, labs, imaging, treatment and response to treatment Triage notes, nursing and pharmacy notes and ED provider's notes Notable results as  noted in HPI  New results:  EKG without acute ST or T wave change, prolonged PR interval - 1st degree AV block is not new compared to previous EKG UA: unremarkable  Troponin: 9   Assessment and Plan: Principal Problem:   Acute cholecystitis Active Problems:   Coronary artery disease involving native coronary artery of native heart without angina pectoris   Hyperlipidemia with target LDL less than 70   OSA (obstructive sleep apnea)   Gout   Anemia  Acute Cholecystitis Pt with epigastric and RUQ abdominal pain with WBC 19k. US showed biliary sludge but not significant for acute cholecystitis however CT Renal showed inflammatory stranding consistent with acute cholecystitis.  - Admit to med-surg  - General Surgery consulted Dr Claudine Mouton by ED provider, to see in AM  - Continue Ceftriaxone and Flagyl IV  - AM CBC, CMP   OSA  -Encourage CPAP qhs   Gout  Chronic and stable  - Hold home allupurinol while NPO  Iron Deficiency Anemia with B12 Deficiency  Hemoglobin stable 14.8. Recent Vit B12 238.  - Hold home Fusion plus capsule   Coronary Artery Disease  HLD  - Crestor discontinued due to myalgias     Advance Care Planning:   Code Status: Full Code   Consults: general surgery   Family Communication: None   Severity of Illness: The appropriate patient status for this patient is INPATIENT. Inpatient status is judged to be reasonable and necessary in order to provide the required intensity of service to ensure the patient's safety.  The patient's presenting symptoms, physical exam findings, and initial radiographic and laboratory data in the context of their chronic comorbidities is felt to place them at high risk for further clinical deterioration. Furthermore, it is not anticipated that the patient will be medically stable for discharge from the hospital within 2 midnights of admission.   * I certify that at the point of admission it is my clinical judgment that the patient will require inpatient hospital care spanning beyond 2 midnights from the point of admission due to high intensity of service, high risk for further deterioration and high frequency of surveillance required.*  Author: Katha Cabal, DO 08/28/2022 1:27 AM  For on call review [email or web address].

## 2022-08-28 ENCOUNTER — Inpatient Hospital Stay: Payer: Medicare Other | Admitting: Registered Nurse

## 2022-08-28 ENCOUNTER — Encounter: Payer: Self-pay | Admitting: Family Medicine

## 2022-08-28 ENCOUNTER — Other Ambulatory Visit: Payer: Self-pay

## 2022-08-28 ENCOUNTER — Encounter
Admission: EM | Disposition: A | Discharge status: Home / Self Care | Payer: Self-pay | Source: Home / Self Care | Attending: Osteopathic Medicine

## 2022-08-28 DIAGNOSIS — K810 Acute cholecystitis: Secondary | ICD-10-CM

## 2022-08-28 LAB — COMPREHENSIVE METABOLIC PANEL
ALT: 26 U/L (ref 0–44)
AST: 28 U/L (ref 15–41)
Albumin: 4.1 g/dL (ref 3.5–5.0)
Alkaline Phosphatase: 66 U/L (ref 38–126)
Anion gap: 8 (ref 5–15)
BUN: 13 mg/dL (ref 8–23)
CO2: 25 mmol/L (ref 22–32)
Calcium: 8.6 mg/dL — ABNORMAL LOW (ref 8.9–10.3)
Chloride: 101 mmol/L (ref 98–111)
Creatinine, Ser: 0.92 mg/dL (ref 0.61–1.24)
GFR, Estimated: >60 mL/min (ref >60)
Glucose, Bld: 124 mg/dL — ABNORMAL HIGH (ref 70–99)
Potassium: 4 mmol/L (ref 3.5–5.1)
Sodium: 134 mmol/L — ABNORMAL LOW (ref 135–145)
Total Bilirubin: 1.3 mg/dL — ABNORMAL HIGH (ref 0.3–1.2)
Total Protein: 7.5 g/dL (ref 6.5–8.1)

## 2022-08-28 LAB — CBC
HCT: 42.6 % (ref 39.0–52.0)
Hemoglobin: 14.1 g/dL (ref 13.0–17.0)
MCH: 32 pg (ref 26.0–34.0)
MCHC: 33.1 g/dL (ref 30.0–36.0)
MCV: 96.6 fL (ref 80.0–100.0)
Platelets: 278 10*3/uL (ref 150–400)
RBC: 4.41 MIL/uL (ref 4.22–5.81)
RDW: 14 % (ref 11.5–15.5)
WBC: 17.8 10*3/uL — ABNORMAL HIGH (ref 4.0–10.5)
nRBC: 0 % (ref 0.0–0.2)

## 2022-08-28 LAB — TROPONIN I (HIGH SENSITIVITY): Troponin I (High Sensitivity): 9 ng/L (ref <18)

## 2022-08-28 SURGERY — CHOLECYSTECTOMY, ROBOT-ASSISTED, LAPAROSCOPIC
Anesthesia: General | Site: Abdomen

## 2022-08-28 MED ORDER — LIDOCAINE HCL (CARDIAC) PF 100 MG/5ML IV SOSY
PREFILLED_SYRINGE | INTRAVENOUS | Status: DC | PRN
  Administered 2022-08-28: 100 mg via INTRAVENOUS

## 2022-08-28 MED ORDER — PROPOFOL 10 MG/ML IV BOLUS
INTRAVENOUS | Status: DC | PRN
  Administered 2022-08-28: 150 mg via INTRAVENOUS

## 2022-08-28 MED ORDER — LACTATED RINGERS IV SOLN: INTRAVENOUS | Status: DC

## 2022-08-28 MED ORDER — DEXAMETHASONE SODIUM PHOSPHATE 10 MG/ML IJ SOLN
INTRAMUSCULAR | Status: DC | PRN
  Administered 2022-08-28: 10 mg via INTRAVENOUS

## 2022-08-28 MED ORDER — ACETAMINOPHEN 10 MG/ML IV SOLN
INTRAVENOUS | Status: AC
  Filled 2022-08-28: qty 100

## 2022-08-28 MED ORDER — EPINEPHRINE PF 1 MG/ML IJ SOLN
INTRAMUSCULAR | Status: AC
  Filled 2022-08-28: qty 1

## 2022-08-28 MED ORDER — METRONIDAZOLE 500 MG/100ML IV SOLN
500.0000 mg | Freq: Two times a day (BID) | INTRAVENOUS | Status: DC
  Administered 2022-08-28 (×2): 500 mg via INTRAVENOUS
  Filled 2022-08-28 (×3): qty 100

## 2022-08-28 MED ORDER — PROPOFOL 10 MG/ML IV BOLUS
INTRAVENOUS | Status: AC
  Filled 2022-08-28: qty 20

## 2022-08-28 MED ORDER — ONDANSETRON HCL 4 MG/2ML IJ SOLN: 4.0000 mg | Freq: Once | INTRAMUSCULAR | Status: DC | PRN

## 2022-08-28 MED ORDER — ONDANSETRON HCL 4 MG/2ML IJ SOLN: 4.0000 mg | Freq: Four times a day (QID) | INTRAMUSCULAR | Status: DC | PRN

## 2022-08-28 MED ORDER — FENTANYL CITRATE PF 50 MCG/ML IJ SOSY
PREFILLED_SYRINGE | INTRAMUSCULAR | Status: AC
  Filled 2022-08-28: qty 1

## 2022-08-28 MED ORDER — ACETAMINOPHEN 325 MG PO TABS: 650.0000 mg | ORAL_TABLET | Freq: Four times a day (QID) | ORAL | Status: DC | PRN

## 2022-08-28 MED ORDER — BUPIVACAINE LIPOSOME 1.3 % IJ SUSP
INTRAMUSCULAR | Status: AC
  Filled 2022-08-28: qty 20

## 2022-08-28 MED ORDER — GUAIFENESIN-DM 100-10 MG/5ML PO SYRP
5.0000 mL | ORAL_SOLUTION | ORAL | Status: DC | PRN
  Administered 2022-08-28: 5 mL via ORAL
  Filled 2022-08-28: qty 10

## 2022-08-28 MED ORDER — ONDANSETRON HCL 4 MG/2ML IJ SOLN
INTRAMUSCULAR | Status: DC | PRN
  Administered 2022-08-28: 4 mg via INTRAVENOUS

## 2022-08-28 MED ORDER — ROCURONIUM BROMIDE 100 MG/10ML IV SOLN
INTRAVENOUS | Status: DC | PRN
  Administered 2022-08-28: 20 mg via INTRAVENOUS
  Administered 2022-08-28: 50 mg via INTRAVENOUS
  Administered 2022-08-28: 20 mg via INTRAVENOUS

## 2022-08-28 MED ORDER — FENTANYL CITRATE (PF) 100 MCG/2ML IJ SOLN
INTRAMUSCULAR | Status: AC
  Filled 2022-08-28: qty 2

## 2022-08-28 MED ORDER — ACETAMINOPHEN 10 MG/ML IV SOLN: 1000.0000 mg | Freq: Once | INTRAVENOUS | Status: DC | PRN

## 2022-08-28 MED ORDER — 0.9 % SODIUM CHLORIDE (POUR BTL) OPTIME
TOPICAL | Status: DC | PRN
  Administered 2022-08-28: 500 mL

## 2022-08-28 MED ORDER — HYDROMORPHONE HCL 1 MG/ML IJ SOLN
1.0000 mg | INTRAMUSCULAR | Status: DC | PRN
  Administered 2022-08-28 (×2): 1 mg via INTRAVENOUS
  Filled 2022-08-28 (×2): qty 1

## 2022-08-28 MED ORDER — BUPIVACAINE-EPINEPHRINE (PF) 0.25% -1:200000 IJ SOLN
INTRAMUSCULAR | Status: DC | PRN
  Administered 2022-08-28: 25 mL

## 2022-08-28 MED ORDER — PHENYLEPHRINE HCL (PRESSORS) 10 MG/ML IV SOLN
INTRAVENOUS | Status: DC | PRN
  Administered 2022-08-28 (×2): 80 ug via INTRAVENOUS
  Administered 2022-08-28: 160 ug via INTRAVENOUS
  Administered 2022-08-28 (×4): 80 ug via INTRAVENOUS

## 2022-08-28 MED ORDER — BUPIVACAINE HCL (PF) 0.25 % IJ SOLN
INTRAMUSCULAR | Status: AC
  Filled 2022-08-28: qty 30

## 2022-08-28 MED ORDER — PHENYLEPHRINE 80 MCG/ML (10ML) SYRINGE FOR IV PUSH (FOR BLOOD PRESSURE SUPPORT)
PREFILLED_SYRINGE | INTRAVENOUS | Status: AC
  Filled 2022-08-28: qty 10

## 2022-08-28 MED ORDER — SODIUM CHLORIDE 0.9 % IV SOLN: INTRAVENOUS | Status: DC

## 2022-08-28 MED ORDER — OXYCODONE HCL 5 MG PO TABS
10.0000 mg | ORAL_TABLET | Freq: Once | ORAL | Status: AC
  Administered 2022-08-28: 10 mg via ORAL
  Filled 2022-08-28: qty 2

## 2022-08-28 MED ORDER — INDOCYANINE GREEN 25 MG IV SOLR
1.2500 mg | Freq: Once | INTRAVENOUS | Status: AC
  Administered 2022-08-28: 1.25 mg via INTRAVENOUS
  Filled 2022-08-28: qty 0.5

## 2022-08-28 MED ORDER — CEFAZOLIN SODIUM-DEXTROSE 2-3 GM-%(50ML) IV SOLR
INTRAVENOUS | Status: DC | PRN
  Administered 2022-08-28: 2 g via INTRAVENOUS

## 2022-08-28 MED ORDER — ENOXAPARIN SODIUM 40 MG/0.4ML IJ SOSY: 40.0000 mg | PREFILLED_SYRINGE | INTRAMUSCULAR | Status: DC

## 2022-08-28 MED ORDER — OXYCODONE HCL 5 MG/5ML PO SOLN: 5.0000 mg | Freq: Once | ORAL | Status: DC | PRN

## 2022-08-28 MED ORDER — SODIUM CHLORIDE 0.9 % IV SOLN
2.0000 g | INTRAVENOUS | Status: DC
  Administered 2022-08-28: 2 g via INTRAVENOUS
  Filled 2022-08-28: qty 20

## 2022-08-28 MED ORDER — FENTANYL CITRATE (PF) 100 MCG/2ML IJ SOLN
INTRAMUSCULAR | Status: DC | PRN
  Administered 2022-08-28 (×5): 25 ug via INTRAVENOUS
  Administered 2022-08-28: 50 ug via INTRAVENOUS
  Administered 2022-08-28: 25 ug via INTRAVENOUS

## 2022-08-28 MED ORDER — FENTANYL CITRATE (PF) 100 MCG/2ML IJ SOLN: 25.0000 ug | INTRAMUSCULAR | Status: DC | PRN

## 2022-08-28 MED ORDER — LACTATED RINGERS IV SOLN: INTRAVENOUS | Status: DC | PRN

## 2022-08-28 MED ORDER — ACETAMINOPHEN 10 MG/ML IV SOLN
INTRAVENOUS | Status: DC | PRN
  Administered 2022-08-28: 1000 mg via INTRAVENOUS

## 2022-08-28 MED ORDER — ACETAMINOPHEN 650 MG RE SUPP: 650.0000 mg | Freq: Four times a day (QID) | RECTAL | Status: DC | PRN

## 2022-08-28 MED ORDER — FENTANYL CITRATE PF 50 MCG/ML IJ SOSY
25.0000 ug | PREFILLED_SYRINGE | INTRAMUSCULAR | Status: DC | PRN
  Administered 2022-08-28: 25 ug via INTRAVENOUS

## 2022-08-28 MED ORDER — ONDANSETRON HCL 4 MG PO TABS: 4.0000 mg | ORAL_TABLET | Freq: Four times a day (QID) | ORAL | Status: DC | PRN

## 2022-08-28 MED ORDER — SUGAMMADEX SODIUM 200 MG/2ML IV SOLN
INTRAVENOUS | Status: DC | PRN
  Administered 2022-08-28: 200 mg via INTRAVENOUS

## 2022-08-28 MED ORDER — OXYCODONE HCL 5 MG PO TABS: 5.0000 mg | ORAL_TABLET | Freq: Once | ORAL | Status: DC | PRN

## 2022-08-28 MED ORDER — ROCURONIUM BROMIDE 10 MG/ML (PF) SYRINGE
PREFILLED_SYRINGE | INTRAVENOUS | Status: AC
  Filled 2022-08-28: qty 10

## 2022-08-28 SURGICAL SUPPLY — 51 items
ADH SKN CLS APL DERMABOND .7 (GAUZE/BANDAGES/DRESSINGS) ×1
BAG PRESSURE INF REUSE 3000 (BAG) IMPLANT
BULB RESERV EVAC DRAIN JP 100C (MISCELLANEOUS) IMPLANT
CLIP LIGATING HEM O LOK PURPLE (MISCELLANEOUS) ×2 IMPLANT
COVER TIP SHEARS 8 DVNC (MISCELLANEOUS) ×2 IMPLANT
DERMABOND ADVANCED .7 DNX12 (GAUZE/BANDAGES/DRESSINGS) ×2 IMPLANT
DRAIN CHANNEL JP 19F (MISCELLANEOUS) IMPLANT
DRAPE ARM DVNC X/XI (DISPOSABLE) ×8 IMPLANT
DRAPE COLUMN DVNC XI (DISPOSABLE) ×2 IMPLANT
ELECT CAUTERY BLADE 6.4 (BLADE) ×2 IMPLANT
FORCEPS BPLR R/ABLATION 8 DVNC (INSTRUMENTS) ×2 IMPLANT
FORCEPS PROGRASP DVNC XI (FORCEP) ×2 IMPLANT
GLOVE ORTHO TXT STRL SZ7.5 (GLOVE) ×4 IMPLANT
GOWN STRL REUS W/ TWL LRG LVL3 (GOWN DISPOSABLE) ×4 IMPLANT
GOWN STRL REUS W/ TWL XL LVL3 (GOWN DISPOSABLE) ×4 IMPLANT
GOWN STRL REUS W/TWL LRG LVL3 (GOWN DISPOSABLE) ×4
GOWN STRL REUS W/TWL XL LVL3 (GOWN DISPOSABLE) ×4
GRASPER SUT TROCAR 14GX15 (MISCELLANEOUS) ×2 IMPLANT
IRRIGATION STRYKERFLOW (MISCELLANEOUS) IMPLANT
IRRIGATOR STRYKERFLOW (MISCELLANEOUS)
IRRIGATOR SUCT 8 DISP DVNC XI (IRRIGATION / IRRIGATOR) IMPLANT
IV NS IRRIG 3000ML ARTHROMATIC (IV SOLUTION) IMPLANT
KIT PINK PAD W/HEAD ARE REST (MISCELLANEOUS) ×2 IMPLANT
KIT PINK PAD W/HEAD ARM REST (MISCELLANEOUS) ×2 IMPLANT
KIT TURNOVER KIT A (KITS) ×2 IMPLANT
LABEL OR SOLS (LABEL) ×2 IMPLANT
MANIFOLD NEPTUNE II (INSTRUMENTS) ×2 IMPLANT
NDL HYPO 22X1.5 SAFETY MO (MISCELLANEOUS) ×2 IMPLANT
NDL INSUFFLATION 14GA 120MM (NEEDLE) ×2 IMPLANT
NEEDLE HYPO 22X1.5 SAFETY MO (MISCELLANEOUS) ×2 IMPLANT
NEEDLE INSUFFLATION 14GA 120MM (NEEDLE) ×2 IMPLANT
NS IRRIG 500ML POUR BTL (IV SOLUTION) ×2 IMPLANT
PACK LAP CHOLECYSTECTOMY (MISCELLANEOUS) ×2 IMPLANT
SCISSORS METZENBAUM CVD 33 (INSTRUMENTS) IMPLANT
SCISSORS MNPLR CVD DVNC XI (INSTRUMENTS) ×2 IMPLANT
SEAL UNIV 5-12 XI (MISCELLANEOUS) ×8 IMPLANT
SET TUBE SMOKE EVAC HIGH FLOW (TUBING) ×2 IMPLANT
SOL ELECTROSURG ANTI STICK (MISCELLANEOUS) ×2
SOLUTION ELECTROSURG ANTI STCK (MISCELLANEOUS) ×2 IMPLANT
SPIKE FLUID TRANSFER (MISCELLANEOUS) ×2 IMPLANT
SUT ETHILON 3-0 FS-10 30 BLK (SUTURE) ×2
SUT MNCRL 4-0 (SUTURE) ×2
SUT MNCRL 4-0 27XMFL (SUTURE) ×2
SUT VICRYL 0 UR6 27IN ABS (SUTURE) ×2 IMPLANT
SUTURE EHLN 3-0 FS-10 30 BLK (SUTURE) IMPLANT
SUTURE MNCRL 4-0 27XMF (SUTURE) ×2 IMPLANT
SYS BAG RETRIEVAL 10MM (BASKET) ×2
SYSTEM BAG RETRIEVAL 10MM (BASKET) ×2 IMPLANT
TRAP FLUID SMOKE EVACUATOR (MISCELLANEOUS) ×2 IMPLANT
TROCAR Z-THREAD FIOS 11X100 BL (TROCAR) ×2 IMPLANT
WATER STERILE IRR 500ML POUR (IV SOLUTION) ×2 IMPLANT

## 2022-08-28 NOTE — Progress Notes (Signed)
Patient sent to OR via bed in stable condition.

## 2022-08-28 NOTE — ED Notes (Signed)
Dr. Dylon Correa Darby  notified via secure chat of patient status, however Dr. Kendarious Gudino Darby states she is no longer in hospital and Dr. Para March is now covering.

## 2022-08-28 NOTE — Progress Notes (Signed)
PROGRESS NOTE    [name]   ZOX:[id number] DOB: [birth year]  DOA: 08/27/2022 Date of Service: 08/28/22 PCP: Dana Allan, MD     Brief Narrative / Hospital Course:  Phillip Nelson is a 88 y.o. male with medical history significant of GI bleed, colon cancer s/p hemicolectomy with primary anastomosis in 09/2021, HLD, OSA with CPAP presented to ED 08/27/22 for abdominal pain. Day prior, he started having stabbing right upper quadrant abdominal pain with nausea.  06/02: right upper quadrant ultrasound with gallbladder sludge however CT renal showed acute cholecystitis and a nonobstructing left renal stone. Not meeting sepsis criteria but did have elevation WBC. He was given Toradol 10 mg. ED provider ordered ceftriaxone and Flagyl in the ED. Admitted to hospitalist service w/ general surgery consult  06/03: plan for OR today per Dr Toula Moos.   Consultants:  General surgery   Procedures: none      ASSESSMENT & PLAN:   Principal Problem:   Acute cholecystitis Active Problems:   Coronary artery disease involving native coronary artery of native heart without angina pectoris   Hyperlipidemia with target LDL less than 70   OSA (obstructive sleep apnea)   Gout   Anemia  Acute Cholecystitis General Surgery consulted Dr Claudine Mouton by ED provider, plan for OR today  Continue Ceftriaxone and Flagyl IV  follow CBC, CMP    OSA  Encourage CPAP qhs    Gout  Chronic and stable  Hold home allupurinol while NPO   Iron Deficiency Anemia with B12 Deficiency  Hemoglobin stable 14.8. Recent Vit B12 238.  Hold home Fusion plus capsule    Coronary Artery Disease  HLD  Crestor discontinued due to myalgias     DVT prophylaxis: lovenox  Pertinent IV fluids/nutrition: NPO pending surgery Central lines / invasive devices: none  Code Status: FULL CODE ACP documentation reviewed: 08/28/22 none on file   Current Admission Status: inpatient   TOC needs / Dispo plan:  TBD Barriers to discharge / significant pending items: cholecystectomy today, if doing well may be able to discharge tomorrow              Subjective / Brief ROS:  Patient reports abdominal pain has been consistent but better w/ meds  Denies CP/SOB. Denies new weakness.  Reports no concerns w/ urination/defecation.   Family Communication: none at this time     Objective Findings:  Vitals:   08/28/22 0930 08/28/22 1100 08/28/22 1145 08/28/22 1200  BP: (!) 161/68 (!) 166/78 (!) 172/71 (!) 182/73  Pulse: 79 79 68 76  Resp:   14 16  Temp:      TempSrc:      SpO2: 100% 100% 99% 98%  Weight:      Height:        Intake/Output Summary (Last 24 hours) at 08/28/2022 1323 Last data filed at 08/28/2022 0640 Gross per 24 hour  Intake 202.63 ml  Output 500 ml  Net -297.37 ml   Filed Weights   08/27/22 1924  Weight: 90.7 kg    Examination:  Physical Exam Constitutional:      General: He is not in acute distress. Cardiovascular:     Rate and Rhythm: Normal rate and regular rhythm.  Pulmonary:     Effort: Pulmonary effort is normal.     Breath sounds: Normal breath sounds.  Abdominal:     General: Bowel sounds are increased.  Skin:    General: Skin is warm and dry.  Neurological:  General: No focal deficit present.     Mental Status: He is alert and oriented to person, place, and time.  Psychiatric:        Mood and Affect: Mood normal.        Behavior: Behavior normal.          Scheduled Medications:   indocyanine green  1.25 mg Intravenous Once    Continuous Infusions:  sodium chloride 100 mL/hr at 08/28/22 1045   cefTRIAXone (ROCEPHIN)  IV     metronidazole 500 mg (08/28/22 1231)    PRN Medications:  acetaminophen **OR** acetaminophen, HYDROmorphone (DILAUDID) injection, ondansetron **OR** ondansetron (ZOFRAN) IV  Antimicrobials from admission:  Anti-infectives (From admission, onward)    Start     Dose/Rate Route Frequency Ordered Stop    08/28/22 2200  cefTRIAXone (ROCEPHIN) 2 g in sodium chloride 0.9 % 100 mL IVPB        2 g 200 mL/hr over 30 Minutes Intravenous Every 24 hours 08/28/22 0122     08/28/22 1200  metroNIDAZOLE (FLAGYL) IVPB 500 mg        500 mg 100 mL/hr over 60 Minutes Intravenous Every 12 hours 08/28/22 0122     08/27/22 2330  cefTRIAXone (ROCEPHIN) 2 g in sodium chloride 0.9 % 100 mL IVPB        2 g 200 mL/hr over 30 Minutes Intravenous  Once 08/27/22 2317 08/27/22 2348   08/27/22 2330  metroNIDAZOLE (FLAGYL) IVPB 500 mg        500 mg 100 mL/hr over 60 Minutes Intravenous  Once 08/27/22 2317 08/28/22 0116           Data Reviewed:  I have personally reviewed the following...  CBC: Recent Labs  Lab 08/27/22 1947 08/28/22 0416  WBC 18.9* 17.8*  HGB 14.8 14.1  HCT 44.3 42.6  MCV 95.9 96.6  PLT 323 278   Basic Metabolic Panel: Recent Labs  Lab 08/27/22 1947 08/28/22 0416  NA 134* 134*  K 3.9 4.0  CL 101 101  CO2 26 25  GLUCOSE 108* 124*  BUN 13 13  CREATININE 0.99 0.92  CALCIUM 9.0 8.6*   GFR: Estimated Creatinine Clearance: 66.5 mL/min (by C-G formula based on SCr of 0.92 mg/dL). Liver Function Tests: Recent Labs  Lab 08/27/22 1947 08/28/22 0416  AST 28 28  ALT 28 26  ALKPHOS 74 66  BILITOT 1.2 1.3*  PROT 7.9 7.5  ALBUMIN 4.5 4.1   Recent Labs  Lab 08/27/22 1947  LIPASE 32   No results for input(s): "AMMONIA" in the last 168 hours. Coagulation Profile: No results for input(s): "INR", "PROTIME" in the last 168 hours. Cardiac Enzymes: No results for input(s): "CKTOTAL", "CKMB", "CKMBINDEX", "TROPONINI" in the last 168 hours. BNP (last 3 results) No results for input(s): "PROBNP" in the last 8760 hours. HbA1C: No results for input(s): "HGBA1C" in the last 72 hours. CBG: No results for input(s): "GLUCAP" in the last 168 hours. Lipid Profile: No results for input(s): "CHOL", "HDL", "LDLCALC", "TRIG", "CHOLHDL", "LDLDIRECT" in the last 72 hours. Thyroid Function  Tests: No results for input(s): "TSH", "T4TOTAL", "FREET4", "T3FREE", "THYROIDAB" in the last 72 hours. Anemia Panel: No results for input(s): "VITAMINB12", "FOLATE", "FERRITIN", "TIBC", "IRON", "RETICCTPCT" in the last 72 hours. Most Recent Urinalysis On File:     Component Value Date/Time   COLORURINE YELLOW (A) 08/27/2022 2323   APPEARANCEUR CLEAR (A) 08/27/2022 2323   LABSPEC 1.008 08/27/2022 2323   PHURINE 6.0 08/27/2022 2323   GLUCOSEU NEGATIVE  08/27/2022 2323   HGBUR NEGATIVE 08/27/2022 2323   BILIRUBINUR NEGATIVE 08/27/2022 2323   KETONESUR NEGATIVE 08/27/2022 2323   PROTEINUR NEGATIVE 08/27/2022 2323   NITRITE NEGATIVE 08/27/2022 2323   LEUKOCYTESUR NEGATIVE 08/27/2022 2323   Sepsis Labs: @LABRCNTIP (procalcitonin:4,lacticidven:4) Microbiology: No results found for this or any previous visit (from the past 240 hour(s)).    Radiology Studies last 3 days: CT Renal Stone Study  Result Date: 08/27/2022 CLINICAL DATA:  Abdominal and flank pain. History of colon cancer and prostate cancer. EXAM: CT ABDOMEN AND PELVIS WITHOUT CONTRAST TECHNIQUE: Multidetector CT imaging of the abdomen and pelvis was performed following the standard protocol without IV contrast. RADIATION DOSE REDUCTION: This exam was performed according to the departmental dose-optimization program which includes automated exposure control, adjustment of the mA and/or kV according to patient size and/or use of iterative reconstruction technique. COMPARISON:  Abdominal ultrasound 08/27/2022. CT abdomen and pelvis 10/19/2021. FINDINGS: Lower chest: No acute abnormality. Hepatobiliary: There is inflammatory stranding surrounding the gallbladder. There is no biliary ductal dilatation. The liver is within normal limits. Pancreas: Unremarkable. No pancreatic ductal dilatation or surrounding inflammatory changes. Spleen: Normal in size without focal abnormality. Adrenals/Urinary Tract: Right renal cysts are present measuring up  to 4.3 cm. There is no hydronephrosis or perinephric fat stranding in either kidney. There is a single punctate calculus in the left kidney. The adrenal glands and bladder are within normal limits. Stomach/Bowel: Right hemicolectomy changes are again noted. There is no bowel obstruction, pneumatosis, wall thickening or inflammation. There is sigmoid colon diverticulosis. There is an area of mesenteric fat necrosis in the right abdomen. Stomach is within normal limits. No free air. Vascular/Lymphatic: Aortic atherosclerosis. No enlarged abdominal or pelvic lymph nodes. Reproductive: Prostate radiotherapy seeds are present. Other: There are small fat containing umbilical hernias. There is no ascites. Musculoskeletal: Degenerative changes affect the spine. No focal osseous lesions are seen. IMPRESSION: 1. Findings compatible with acute cholecystitis. 2. Nonobstructing left renal calculus. 3. Colonic diverticulosis. 4. Right Bosniak I benign renal cyst measuring 4.3 cm. No follow-up imaging is recommended. JACR 2018 Feb; 264-273, Management of the Incidental Renal Mass on CT, [employer] 2021; 814-848, Bosniak Classification of Cystic Renal Masses, Version 2019. Aortic Atherosclerosis (ICD10-I70.0). Electronically Signed   By: Darliss Cheney M.D.   On: 08/27/2022 22:39   US ABDOMEN LIMITED RUQ (LIVER/GB)  Result Date: 08/27/2022 CLINICAL DATA:  Right upper quadrant pain EXAM: ULTRASOUND ABDOMEN LIMITED RIGHT UPPER QUADRANT COMPARISON:  10/19/2021 FINDINGS: Gallbladder: Echogenic sludge fills the gallbladder lumen. No shadowing gallstones. No evidence of acute cholecystitis. Negative sonographic Murphy sign. Common bile duct: Diameter: 6 mm Liver: Increased liver echotexture most consistent with hepatic steatosis. No focal liver abnormality or intrahepatic biliary duct dilation. Portal vein is patent on color Doppler imaging with normal direction of blood flow towards the liver. Other: None. IMPRESSION: 1.  Gallbladder sludge. No evidence of cholelithiasis or cholecystitis. 2. Increased liver echotexture consistent with hepatic steatosis. Electronically Signed   By: Sharlet Salina M.D.   On: 08/27/2022 21:10   DG Chest Portable 1 View  Result Date: 08/27/2022 CLINICAL DATA:  Hypoxia. Right upper quadrant pain. EXAM: PORTABLE CHEST 1 VIEW COMPARISON:  Chest from acute abdomen series 10/15/2021 FINDINGS: Prior median sternotomy. The heart is normal in size. Mediastinal contours are normal. No focal airspace disease, pulmonary edema, pleural effusion or pneumothorax. Chronic right shoulder arthropathy. IMPRESSION: No acute abnormality. Electronically Signed   By: Narda Rutherford M.D.   On: 08/27/2022 20:21  LOS: 1 day       Sunnie Nielsen, DO [facility] 08/28/2022, 1:23 PM    Dictation software may have been used to generate the above note. Typos may occur and escape review in typed/dictated notes. Please contact Dr Lyn Hollingshead directly for clarity if needed.  Staff may message me via secure chat in Epic  but this may not receive an immediate response,  please page me for urgent matters!  If 7PM-7AM, please contact night coverage [email or web address]

## 2022-08-28 NOTE — ED Notes (Signed)
Dr. Para March updated on patient via secure chat and notified patient now appears comfortable, appears sleeping and no longer diaphoretic.

## 2022-08-28 NOTE — ED Notes (Incomplete)
Patient sitting upright in chair. Stated, "I'm going to die." Patient noted to be diaphoretic. Stated he felt like he was not getting enough air and that he was congested. Reports constant pain, worse with inspiration. EKG obtained and given to Dr. Marisa Severin. Oxygen increased to 3L per [place] and fan placed at bedside. Patient now states he is feeling

## 2022-08-28 NOTE — ED Notes (Addendum)
Patient diaphoretic, reports he feels he can't get enough air and has constant pain that is worse with inspiration. Stated "I'm going to die." EKG obtained.

## 2022-08-28 NOTE — ED Notes (Signed)
Dr. Para March messaged via secure chat to update on patient status.

## 2022-08-28 NOTE — Anesthesia Preprocedure Evaluation (Addendum)
Anesthesia Evaluation    History of Anesthesia Complications Negative for: history of anesthetic complications  Airway Mallampati: II  TM Distance: >3 FB Neck ROM: full    Dental  (+) Dental Advidsory Given, Edentulous Upper, Poor Dentition, Caps, Missing   Pulmonary shortness of breath and with exertion, sleep apnea (non-compliant with CPAP) , neg COPD, neg recent URI, former smoker (quit 1989)   Pulmonary exam normal        Cardiovascular hypertension, (-) angina + CAD (s/p MI and CABG), + Past MI and + Peripheral Vascular Disease (bilateral carotid stenosis)  (-) Cardiac Stents Normal cardiovascular exam+ dysrhythmias (RBBB) (-) Valvular Problems/Murmurs  ECG 08/28/22: Sinus rhythm Prolonged PR interval RBBB and LAFB  Echo 07/29/21: EF 55 to 60%.  Normal wall motion.  GR 1 DD.  Normal RV size and function.  Unable to assess PAP, normal RAP.  Normal MV.  AOV sclerosis with no stenosis.  Myocardial perfusion 07/31/16: Normal myocardial perfusion without evidence of myocardial ischemia   Neuro/Psych  Headaches, neg Seizures  negative psych ROS   GI/Hepatic negative GI ROS, Neg liver ROS,,,Colon CA s/p colectomy   Endo/Other  diabetes (borderline)  Prediabetes   Renal/GU negative Renal ROS   Prostate CA    Musculoskeletal  (+) Arthritis ,  Gout    Abdominal   Peds  Hematology  (+) Blood dyscrasia, anemia   Anesthesia Other Findings Cardiology note 02/09/22:  Coronary artery disease involving native coronary artery of native heart without angina pectoris - Primary (Chronic)       Doing well.  Asymptomatic.  At this point he is not to take the aspirin and statin even if I try to put him on it.  I did ask him to try to take half a tablet of Crestor once a week. If he is able to take it 1 day a week then after 2 weeks try to increase to 2 days a week.  Every 2 weeks if tolerated increase it by another day a week.   His labs  are not all that bad control and therefore I am not going to push too hard and a 88 year old. As long as he is asymptomatic.  No further evaluation.   I do not think is any reason for [place] to go forward with any further ischemic testing since his graft was a LIMA to LAD, it is not like it is a vein graft that would have a high likelihood of closing down by this timeframe. Would only test if he is having symptoms.      LDL was not all that bad.  I wanted to try to begin him on anything so we tried to get him to take 10 mg 1 day a week of rosuvastatin, increase by 1 day a week every 2 weeks as tolerated.  At least maybe he can take 1 tablet a week.      Not using, normally plan to use his CPAP.      Simply cannot tell any higher than 10 mg Crestor.   He is not interested in trying to look at other medicine such as PCSK9 meters.  He does not want to give himself a shot.  He understands that having higher lipids could put him at risk for developing worsening coronary disease.  He seems to be perfectly fine with that.      History of GI bleed, therefore he is reluctant to take aspirin.   Again he has no stent as far  as we know only single-vessel disease with a LIMA.  Therefore, at 88 if he does not want to take it he does not have to take it.   Reproductive/Obstetrics                             Anesthesia Physical Anesthesia Plan  ASA: 3  Anesthesia Plan: General   Post-op Pain Management:    Induction: Intravenous  PONV Risk Score and Plan: 2 and Ondansetron, Dexamethasone and Treatment may vary due to age or medical condition  Airway Management Planned: Oral ETT  Additional Equipment:   Intra-op Plan:   Post-operative Plan: Extubation in OR  Informed Consent:   Plan Discussed with:   Anesthesia Plan Comments:         Anesthesia Quick Evaluation

## 2022-08-28 NOTE — Anesthesia Procedure Notes (Signed)
Procedure Name: Intubation Date/Time: 08/28/2022 5:54 PM  Performed by: Reece Agar, CRNAPre-anesthesia Checklist: Patient identified, Emergency Drugs available, Suction available and Patient being monitored Patient Re-evaluated:Patient Re-evaluated prior to induction Oxygen Delivery Method: Circle system utilized Preoxygenation: Pre-oxygenation with 100% oxygen Induction Type: IV induction Ventilation: Mask ventilation without difficulty Laryngoscope Size: McGraph and 3 Grade View: Grade I Tube type: Oral Tube size: 7.5 mm Number of attempts: 1 Airway Equipment and Method: Stylet Placement Confirmation: ETT inserted through vocal cords under direct vision, positive ETCO2 and breath sounds checked- equal and bilateral Secured at: 23 cm Tube secured with: Tape Dental Injury: Teeth and Oropharynx as per pre-operative assessment

## 2022-08-28 NOTE — Op Note (Signed)
Robotic cholecystectomy with Indocyamine Green Ductal Imaging.   Pre-operative Diagnosis: Acute cholecystitis  Post-operative Diagnosis: Gangrenous cholecystitis.  Procedure: Robotic assisted laparoscopic cholecystectomy with Indocyamine Green Ductal Imaging.   Surgeon: Campbell Lerner, M.D., FACS  Anesthesia: General. with endotracheal tube  Findings: No definitive firefly imaging of the major hepatic ducts, nor cystic duct or gallbladder.  Extensive necrosis of the gallbladder body and neck with inability to appreciate viable cystic duct.  Remarkable inflammatory changes and associated encasement around the neck of the gallbladder.  No definitive cystic duct control, no appreciable bile after excision of apparent gallbladder neck/proximal cystic duct junction.  19 Blake drain left in gallbladder fossa.  Estimated Blood Loss: 50 mL         Drains: None         Specimens: Gallbladder           Complications: none  Procedure Details  The patient was seen again in the Holding Room.  1.25 mg dose of ICG was administered intravenously.  Additional perioperative antibiotics of Ancef given, had previously received Rocephin and Flagyl. The benefits, complications, treatment options, risks and expected outcomes were again reviewed with the patient. The likelihood of improving the patient's symptoms with return to their baseline status is good.  The patient and/or family concurred with the proposed plan, giving informed consent, again alternatives reviewed.  The patient was taken to Operating Room, identified, and the procedure verified as robotic assisted laparoscopic cholecystectomy.  Prior to the induction of general anesthesia, antibiotic prophylaxis was administered. VTE prophylaxis was in place. General endotracheal anesthesia was then administered and tolerated well. The patient was positioned in the supine position.  After the induction, the abdomen was prepped with Chloraprep and draped in  the sterile fashion.  A Time Out was held and the above information confirmed.  Check check movable movable 6 after local infiltration of quarter percent Marcaine with epinephrine, stab incision was made left upper quadrant.  Just below the costal margin at Palmer's point, approximately midclavicular line the Veres needle is passed with sensation of the layers to penetrate the abdominal wall and into the peritoneum.  Saline drop test is confirmed peritoneal placement.  Insufflation is initiated with carbon dioxide to pressures of 15 mmHg.  Right para-umbilical local infiltration with quarter percent Marcaine with epinephrine is utilized.  Made a 12 mm incision on the right periumbilical site, I advanced an optical 11mm port under direct visualization into the peritoneal cavity.  Once the peritoneum was penetrated, insufflation was initiated.  The trocar was then advanced into the abdominal cavity under direct visualization. Pneumoperitoneum was then continued utilizing CO2 at 15 mmHg or less and tolerated well without any adverse changes in the patient's vital signs.  Two 8.5-mm ports were placed in the left lower quadrant and laterally, and one to the right lower quadrant, all under direct vision. All skin incisions  were infiltrated with a local anesthetic agent before making the incision and placing the trocars.  The patient was positioned  in reverse Trendelenburg, tilted the patient's left side down.  Da Vinci XI robot was then positioned on to the patient's left side, and docked.  The gallbladder was identified, the fundus grasped via the arm 4 Prograsp and retracted cephalad. Adhesions were lysed with scissors and cautery. *** The infundibulum was identified grasped and retracted laterally, exposing the peritoneum overlying the triangle of Calot. This was then opened and dissected using cautery & scissors. An extended critical view of the cystic duct and  cystic artery was obtained, aided by the ICG  via FireFly which improved localization of the ductal anatomy.    The cystic duct was clearly identified and dissected to isolation.   Artery well isolated and clipped, and the cystic duct was triple clipped and divided with scissors, as close to the gallbladder neck as feasible, thus leaving two on the remaining stump.  The specimen side of the artery is sealed with bipolar and divided with monopolar scissors.   The gallbladder was taken from the gallbladder fossa in a retrograde fashion with the electrocautery. The gallbladder was removed and placed in an Endocatch bag.  The liver bed is inspected. Hemostasis was confirmed.  The robot was undocked and moved away from the operative field. *** irrigation was utilized. *** and was aspirated clear.  The gallbladder and Endocatch sac were then removed through the infraumbilical port site.   Inspection of the right upper quadrant was performed. No bleeding, bile duct injury or leak, or bowel injury was noted. The infra-umbilical port site fascia was closed with interrumpted 0 Vicryl sutures using PMI/cone under direct visualization. Pneumoperitoneum was released and ports removed.  4-0 subcuticular Monocryl was used to close the skin. Dermabond was  applied.  The patient was then extubated and brought to the recovery room in stable condition. Sponge, lap, and needle counts were correct at closure and at the conclusion of the case.               Campbell Lerner, M.D., [facility] 08/28/2022 8:28 PM

## 2022-08-28 NOTE — ED Notes (Signed)
Patient states he is feeling slightly better. Resp even, unlabored on 3L. Appeared to be sleeping upon RN entering room but opens eyes to verbal stimuli. Declines additional needs but states he just wants the pain to stop.

## 2022-08-28 NOTE — Transfer of Care (Signed)
Immediate Anesthesia Transfer of Care Note  Patient: Phillip Nelson  Procedure(s) Performed: XI ROBOTIC ASSISTED LAPAROSCOPIC CHOLECYSTECTOMY (Abdomen) INDOCYANINE GREEN FLUORESCENCE IMAGING (ICG)  Patient Location: PACU  Anesthesia Type:General  Level of Consciousness: drowsy  Airway & Oxygen Therapy: Patient Spontanous Breathing and Patient connected to face mask oxygen  Post-op Assessment: Report given to RN and Post -op Vital signs reviewed and stable  Post vital signs: Reviewed and stable  Last Vitals:  Vitals Value Taken Time  BP 163/72   Temp    Pulse 94 08/28/22 2029  Resp 14 08/28/22 2029  SpO2 100 % 08/28/22 2029  Vitals shown include unvalidated device data.  Last Pain:  Vitals:   08/28/22 1722  TempSrc:   PainSc: 8          Complications: No notable events documented.

## 2022-08-28 NOTE — ED Notes (Signed)
Patient resting in recliner with eyes closed, appears sleeping with spontaneous regular respirations on 3L per [place]. Patient no longer diaphoretic with pink color. Appears comfortable.

## 2022-08-28 NOTE — Hospital Course (Addendum)
Phillip Nelson is a 88 y.o. male with medical history significant of GI bleed, colon cancer s/p hemicolectomy with primary anastomosis in 09/2021, HLD, OSA with CPAP presented to ED 08/27/22 for abdominal pain. Day prior, he started having stabbing right upper quadrant abdominal pain with nausea.  06/02: right upper quadrant ultrasound with gallbladder sludge however CT renal showed acute cholecystitis and a nonobstructing left renal stone. Not meeting sepsis criteria but did have elevation WBC. He was given Toradol 10 mg. ED provider ordered ceftriaxone and Flagyl in the ED. Admitted to hospitalist service w/ general surgery consult  06/03: plan for OR today per Dr Toula Moos.   Consultants:  General surgery   Procedures: none      ASSESSMENT & PLAN:   Principal Problem:   Acute cholecystitis Active Problems:   Coronary artery disease involving native coronary artery of native heart without angina pectoris   Hyperlipidemia with target LDL less than 70   OSA (obstructive sleep apnea)   Gout   Anemia  Acute Cholecystitis General Surgery consulted Dr Claudine Mouton by ED provider, plan for OR today  Continue Ceftriaxone and Flagyl IV  follow CBC, CMP    OSA  Encourage CPAP qhs    Gout  Chronic and stable  Hold home allupurinol while NPO   Iron Deficiency Anemia with B12 Deficiency  Hemoglobin stable 14.8. Recent Vit B12 238.  Hold home Fusion plus capsule    Coronary Artery Disease  HLD  Crestor discontinued due to myalgias     DVT prophylaxis: lovenox  Pertinent IV fluids/nutrition: NPO pending surgery Central lines / invasive devices: none  Code Status: FULL CODE ACP documentation reviewed: 08/28/22 none on file   Current Admission Status: inpatient   TOC needs / Dispo plan: TBD Barriers to discharge / significant pending items: cholecystectomy today, if doing well may be able to discharge tomorrow

## 2022-08-28 NOTE — Consult Note (Signed)
[facility] SURGICAL CONSULTATION NOTE (initial) - cpt: 99244   HISTORY OF PRESENT ILLNESS (HPI):  88 y.o. male well known to our service following right hemicolectomy for right colon cancer in July 2023 with Dr Everlene Farrier. He has recovered well from this. He presented to [facility] ED today for evaluation of abdominal pain. Patient reports the acute onset of RUQ abdominal pain over the last 24 hours. This has been consistent without any relief. Accompanied by nausea. No fever, chills, cough, CP, SOB, urinary changes, or bowel changes. Work up in the ED revealed leukocytosis to 18.9K, renal function normal with sCr - 0.99, no significant electrolyte derangements, LFTs are unremarkable, Lipase normal at 32. He did have RUQ [place] which showed gallbladder sludge but no gross evidence of cholecystitis. However, on follow up CT there is significant inflammation about the gallbladder in the RUQ.   Of note, he does report prior to this episode he was high functioning and able to perform greater than 4 METS of activity without CP nor SOB.   Surgery is consulted by emergency medicine physician Dr. Sharman Cheek, MD in this context for evaluation and management of acute cholecystitis.  PAST MEDICAL HISTORY (PMH):  Past Medical History:  Diagnosis Date   Acute lower GI bleeding 07/14/2022   Adenocarcinoma of prostate (HCC) 05/08/2012   a.) Gleason = 3+3 = 6; PSA = 8.26; volume = 57cc; b.) s/p brachytherapy   Adenocarcinoma of transverse colon (HCC) 08/24/2021   a.) Bx (+) for moderately differentiated adenocarcinoma   Aortic atherosclerosis (HCC)    Aural vertigo    AVM (arteriovenous malformation) of duodenum, acquired    Basal cell carcinoma 12/31/2006   L mid infraclavicular. Excised 02/27/2007, margins free.   Basal cell carcinoma 10/30/2016    R post shoulder. Nodular pattern.   Basal cell carcinoma 03/31/2020   L lat pretibial - ED&C    Coronary artery disease involving native coronary  artery of native heart with other form of angina pectoris (HCC) 09/2003   a.) followed by [facility] Gwen Pounds); transitioned to [facility] Herbie [place]) 06/2021; b.) NSTEMI 09/2003 (Sx= heaviness in chest ~100lb brick, no SOB); LHC --> 95% oLAD with diffuse 50% stenosis -> referred to CVTS; c.) s/p CABG x 1 (LIMA-LAD) 10/19/2003; d.) Non-Ischemic Cardiolite 07/2016   Daily headache 09/22/2019   Diastolic dysfunction    a.) TTE 08/04/2014: EF 45%, mild global HK, mild LVH, mild MAC, mild TR/PR; b.) TTE 08/10/2016: EF 50%, mild LVH, mild MAC, mild MR/TR/PR; c.) TTE 07/29/2021: ED 55-60%, G1DD, Ao sclerosis with no stenosis.   Diverticulitis    DOE (dyspnea on exertion)    DOE (dyspnea on exertion) 07/12/2021   ED (erectile dysfunction)    Frequent headaches    GI bleed 11/20/2018   Goals of care, counseling/discussion 08/31/2021   Gout, arthritis    Hemorrhoids    History of heart attack 09/2003   History of lower GI bleeding 03/02/2015   History of prostate cancer 05/08/2012   Adenocarcinoma,gleason=3+3=6,PSA=8.26,vol=57cc  75 seeds treatment   HTN (hypertension)    Hyperlipidemia    Hypotension due to hypovolemia    IDA (iron deficiency anemia)    Lower GI bleed 07/15/2022   Myalgia due to statin    NSTEMI (non-ST elevated myocardial infarction) (HCC) 10/17/2003   a.) LHC 10/18/2003 --> 95% oLAD with multiple 50% sequential lesions --> referred to CVTS. b.) CABG x 1 (LIMA-LAD) 10/19/2003.   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 surgery.    BILATERAL CARPAL TUNNEL RELEASE Right 06/28/2001   Right (endoscopic)   CARPECTOMY Right    wrist   CATARACT EXTRACTION W/ INTRAOCULAR LENS  IMPLANT, BILATERAL     COLON SURGERY     COLONOSCOPY WITH PROPOFOL N/A 10/31/2016   Procedure: COLONOSCOPY WITH PROPOFOL;  Surgeon: Kieth Brightly, MD;  Location: [facility] ENDOSCOPY;  Service: Endoscopy;  Laterality: N/A;   COLONOSCOPY WITH PROPOFOL N/A 08/24/2021   Procedure: COLONOSCOPY WITH PROPOFOL;  Surgeon: Toney Reil, MD;  Location: [facility] ENDOSCOPY;  Service: Gastroenterology;  Laterality: N/A;   COLONOSCOPY WITH PROPOFOL N/A 07/16/2022   Procedure: COLONOSCOPY WITH PROPOFOL;  Surgeon: Toney Reil, MD;  Location: [facility] ENDOSCOPY;  Service: Gastroenterology;  Laterality: N/A;   CORONARY ARTERY BYPASS GRAFT  10/19/2003   Procedure: CORONARY ARTERY BYPASS GRAFT; Locaation: [facility]; Surgeon:  Carlyon Prows, IV, MD   DORSAL COMPARTMENT RELEASE Left 06/25/2017   Procedure: LEFT WRIST TENDON SHEATH RELEASE;  Surgeon: Mack Hook, MD;  Location:  SURGERY CENTER;  Service: Orthopedics;  Laterality: Left;   EMBOLIZATION N/A 11/21/2018   Procedure: EMBOLIZATION (Colonic);  Surgeon: Renford Dills, MD;  Location: [facility] INVASIVE CV LAB;  Service: Cardiovascular;  Laterality: N/A;   ESOPHAGOGASTRODUODENOSCOPY (EGD) WITH PROPOFOL N/A 08/24/2021   Procedure: ESOPHAGOGASTRODUODENOSCOPY (EGD) WITH PROPOFOL;  Surgeon: Toney Reil, MD;  Location: [facility] ENDOSCOPY;  Service: Gastroenterology;  Laterality: N/A;   FOOT SURGERY Bilateral    GOUT   KNEE ARTHROSCOPY Bilateral 1986   Right: 1986; left 09/06/2009   LAPAROSCOPIC RIGHT COLECTOMY N/A 10/11/2021   Procedure: LAPAROSCOPIC RIGHT  COLECTOMY-RNFA to assist Converted to open procedure;  Surgeon: Leafy Ro, MD;  Location: [facility] ORS;  Service: General;  Laterality: N/A;  Provider is requesting 3 hours(180 minutes) for this case   LEFT ELBOW SURGERY  AGE 48  &  2007   LEFT HEART CATH AND CORONARY ANGIOGRAPHY  10/18/2003   [facility] (non-STEMI): Ostial LAD 95% with multiple sequential 50% stenoses. => Referred for CABG   NM GATED MYOCARDIAL STUDY (ARMX HX)  07/31/2016   Treadmill Myoview/Cardiolite : [facility]): 4.6 METS, 92 % MPHR.  EF 55 to 60%.  Normal wall motion.  No ischemia or infarction.   OLECRANON BURSA EXCISION Right 06/28/2001   OLECRANON BURSA EXCISION Left 06/24/2007   RADIOACTIVE SEED IMPLANT N/A 08/02/2012   Procedure: RADIOACTIVE SEED IMPLANT;  Surgeon: Garnett Farm, MD;  Location: [facility];  Service: Urology;  Laterality: N/A;   TOTAL KNEE ARTHROPLASTY Bilateral 04/14/2004   RIGHT 2006; LEFT 11/21/2010   TRANSTHORACIC ECHOCARDIOGRAM  07/31/2016   ([facility]) normal LV function.  Mild LVH.  EF 50%.  Normal RV.  Mild  MR, TR and PR.  No stenosis.     MEDICATIONS:  Prior to Admission medications   Medication Sig Start Date End Date Taking? Authorizing Provider  allopurinol (ZYLOPRIM) 300 MG tablet Take 1 tablet (300 mg total) by mouth daily. 06/12/22  Yes Dana Allan, MD  Iron-FA-B Cmp-C-Biot-Probiotic (FUSION PLUS) CAPS Take 1 capsule by mouth every other day. 07/18/22  Yes Vanga, Loel Dubonnet, MD  Potassium 99 MG TABS Take 1 tablet by mouth daily as needed (leg cramps).   Yes [provider]     ALLERGIES:  Allergies  Allergen Reactions   Indocin [Indomethacin] Other (See Comments)    Shakes. tremors   Iodinated Contrast Media Hives   Penicillins Itching, Rash and Other (See Comments)    Did it involve swelling of the face/tongue/throat, SOB, or low BP? Unknown Did it involve sudden or severe rash/hives, skin peeling, or any reaction on the inside of  your mouth or nose? Unknown Did you need to seek medical attention at a hospital or doctor's office? Unknown When did it last happen? unknown If all above answers are "NO", may proceed with cephalosporin use.      SOCIAL HISTORY:  Social History   Socioeconomic History   Marital status: Media planner    Spouse name: Alona Bene   Number of children: 1   Years of education: Not on file   Highest education level: Some college, no degree  Occupational History   Occupation: Retired  Tobacco Use   Smoking status: Former    Packs/day: 3.00    Years: 31.00    Additional pack years: 0.00    Total pack years: 93.00    Types: Cigarettes    Quit date: 08/01/1987    Years since quitting: 35.0   Smokeless tobacco: Never  Vaping Use   Vaping Use: Never used  Substance and Sexual Activity   Alcohol use: Not Currently    Comment: once a year on NYE   Drug use: No   Sexual activity: Not Currently  Other Topics Concern   Not on file  Social History Narrative   09/22/19   From: the area   Living: with partner - Clyde Canterbury (dementia) 1984   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  He does all of the housework including cooking, cleaning, laundry, vacuuming and yard work.      Safety   Seat belts: Yes    Guns: Yes  and secure   Safe in relationships: Yes    Social Determinants of Health   Financial Resource Strain: Low Risk  (09/12/2021)   Overall Financial Resource Strain (CARDIA)    Difficulty of Paying Living Expenses: Not hard at all  Food Insecurity: No Food Insecurity (07/19/2022)   Hunger Vital Sign    Worried About Running Out of Food in the Last Year: Never true    Ran Out of Food in the Last Year: Never true  Transportation Needs: No Transportation Needs (07/19/2022)    PRAPARE - Administrator, Civil Service (Medical): No    Lack of Transportation (Non-Medical): No  Physical Activity: Insufficiently Active (09/12/2021)   Exercise Vital Sign    Days of Exercise per  Week: 3 days    Minutes of Exercise per Session: 30 min  Stress: No Stress Concern Present (09/12/2021)   [employer] of Occupational Health - Occupational Stress Questionnaire    Feeling of Stress : Not at all  Social Connections: Unknown (09/12/2021)   Social Connection and Isolation Panel [NHANES]    Frequency of Communication with Friends and Family: Three times a week    Frequency of Social Gatherings with Friends and Family: Once a week    Attends Religious Services: Not on Marketing executive or Organizations: No    Attends Banker Meetings: Never    Marital Status: Living with partner  Intimate Partner Violence: Not At Risk (07/14/2022)   Humiliation, Afraid, Rape, and Kick questionnaire    Fear of Current or Ex-Partner: No    Emotionally Abused: No    Physically Abused: No    Sexually Abused: No     FAMILY HISTORY:  Family History  Problem Relation Age of Onset   Cerebral aneurysm Mother    Alzheimer's disease Father    Lung cancer Sister 39       treated surgically   Cancer - Lung Sister       REVIEW OF SYSTEMS:  Review of Systems  Constitutional:  Negative for chills and fever.  Respiratory:  Negative for cough and shortness of breath.   Cardiovascular:  Negative for chest pain and palpitations.  Gastrointestinal:  Positive for abdominal pain and nausea. Negative for constipation, diarrhea and vomiting.  Genitourinary:  Negative for dysuria and urgency.  All other systems reviewed and are negative.   VITAL SIGNS:  Temp:  [97.8 F (36.6 C)-98.2 F (36.8 C)] 98.1 F (36.7 C) (06/03 0901) Pulse Rate:  [66-157] 79 (06/03 0930) Resp:  [11-20] 20 (06/03 0901) BP: (125-161)/(60-77) 161/68 (06/03 0930) SpO2:  [88 %-100 %] 100 %  (06/03 0930) Weight:  [90.7 kg] 90.7 kg (06/02 1924)     Height: 5\' 10"  (177.8 cm) Weight: 90.7 kg BMI (Calculated): 28.7   INTAKE/OUTPUT:  06/02 0701 - 06/03 0700 In: 202.6 [IV Piggyback:202.6] Out: 500 [Urine:500]  PHYSICAL EXAM:  Physical Exam Vitals and nursing note reviewed. Exam conducted with a chaperone present.  Constitutional:      Appearance: He is well-developed. He is obese.     Comments: Patient sitting in chair; appears uncomfortable   HENT:     Head: Normocephalic and atraumatic.  Eyes:     General: No scleral icterus.    Extraocular Movements: Extraocular movements intact.  Cardiovascular:     Rate and Rhythm: Normal rate.     Heart sounds: Normal heart sounds.  Pulmonary:     Effort: Pulmonary effort is normal. No respiratory distress.  Abdominal:     General: A surgical scar is present.     Palpations: Abdomen is soft.     Tenderness: There is abdominal tenderness in the right upper quadrant and epigastric area. There is no guarding or rebound. Positive signs include Murphy's sign.     Comments: Abdomen is obese, soft, he is markedly tender in RUQ, grossly positive Murphy's Sign, non-distended. Previous laparotomy scar is well healed  Genitourinary:    Comments: Deferred Skin:    General: Skin is warm and dry.     Coloration: Skin is not jaundiced.  Neurological:     General: No focal deficit present.     Mental Status: He is alert and oriented to person, place, and time.  Psychiatric:        Mood and Affect: Mood normal.        Behavior: Behavior normal.      Labs:     Latest Ref Rng & Units 08/28/2022    4:16 AM 08/27/2022    7:47 PM 07/17/2022    8:51 AM  CBC  WBC 4.0 - 10.5 K/uL 17.8  18.9  10.6   Hemoglobin 13.0 - 17.0 g/dL 16.1  09.6  04.5   Hematocrit 39.0 - 52.0 % 42.6  44.3  31.3   Platelets 150 - 400 K/uL 278  323  260       Latest Ref Rng & Units 08/28/2022    4:16 AM 08/27/2022    7:47 PM 07/16/2022    7:07 AM  CMP  Glucose 70 - 99  mg/dL 409  811  914   BUN 8 - 23 mg/dL 13  13  15    Creatinine 0.61 - 1.24 mg/dL 7.82  9.56  2.13   Sodium 135 - 145 mmol/L 134  134  137   Potassium 3.5 - 5.1 mmol/L 4.0  3.9  3.9   Chloride 98 - 111 mmol/L 101  101  106   CO2 22 - 32 mmol/L 25  26  21    Calcium 8.9 - 10.3 mg/dL 8.6  9.0  8.7   Total Protein 6.5 - 8.1 g/dL 7.5  7.9    Total Bilirubin 0.3 - 1.2 mg/dL 1.3  1.2    Alkaline Phos 38 - 126 U/L 66  74    AST 15 - 41 U/L 28  28    ALT 0 - 44 U/L 26  28      Imaging studies:   RUQ [place] (08/27/2022) personally reviewed with gallbladder sludge, and radiologist report reviewed below:  IMPRESSION: 1. Gallbladder sludge. No evidence of cholelithiasis or cholecystitis. 2. Increased liver echotexture consistent with hepatic steatosis.  CT Abdomen/Pelvis (08/27/2022) personally reviewed which does show inflammation around the gallbladder although this is limited by lack of contrast, and radiologist report reviewed below:  IMPRESSION: 1. Findings compatible with acute cholecystitis. 2. Nonobstructing left renal calculus. 3. Colonic diverticulosis. 4. Right Bosniak I benign renal cyst measuring 4.3 cm. No follow-up imaging is recommended. JACR 2018 Feb; 264-273, Management of the Incidental Renal Mass on CT, [employer] 2021; 814-848, Bosniak Classification of Cystic Renal Masses, Version 2019.   Assessment/Plan: (ICD-10's: K81.0) 88 y.o. male with acute cholecystitis.   - Appreciate medicine admission - I do think he warrants cholecystectomy; timing to be determined but most likely late afternoon - All risks, benefits, and alternatives to above procedure(s) were discussed with the patient, all of his questions were answered to his expressed satisfaction, patient expresses he wishes to proceed, and informed consent was obtained.   - NPO + IVF Support - IV Abx (Rocephin, Flagyl)   - Monitor abdominal examination; on-going bowel function - Pain control prn; antiemetics prn    - Monitor leukocytosis  - Further management per primary service; we will follow   All of the above findings and recommendations were discussed with the patient, and all of patient's questions were answered to his expressed satisfaction.  Thank you for the opportunity to participate in this patient's care.   -- Lynden Oxford, PA-C [facility] 08/28/2022, 10:27 AM M-F: 7am - 4pm

## 2022-08-29 ENCOUNTER — Telehealth: Payer: Self-pay | Admitting: Family Medicine

## 2022-08-29 DIAGNOSIS — K810 Acute cholecystitis: Secondary | ICD-10-CM | POA: Diagnosis not present

## 2022-08-29 LAB — COMPREHENSIVE METABOLIC PANEL
ALT: 38 U/L (ref 0–44)
AST: 55 U/L — ABNORMAL HIGH (ref 15–41)
Albumin: 3.1 g/dL — ABNORMAL LOW (ref 3.5–5.0)
Alkaline Phosphatase: 52 U/L (ref 38–126)
Anion gap: 9 (ref 5–15)
BUN: 23 mg/dL (ref 8–23)
CO2: 23 mmol/L (ref 22–32)
Calcium: 8 mg/dL — ABNORMAL LOW (ref 8.9–10.3)
Chloride: 103 mmol/L (ref 98–111)
Creatinine, Ser: 1.14 mg/dL (ref 0.61–1.24)
GFR, Estimated: >60 mL/min (ref >60)
Glucose, Bld: 149 mg/dL — ABNORMAL HIGH (ref 70–99)
Potassium: 4.2 mmol/L (ref 3.5–5.1)
Sodium: 135 mmol/L (ref 135–145)
Total Bilirubin: 0.8 mg/dL (ref 0.3–1.2)
Total Protein: 6.3 g/dL — ABNORMAL LOW (ref 6.5–8.1)

## 2022-08-29 LAB — CBC
HCT: 41.3 % (ref 39.0–52.0)
Hemoglobin: 13.5 g/dL (ref 13.0–17.0)
MCH: 31.7 pg (ref 26.0–34.0)
MCHC: 32.7 g/dL (ref 30.0–36.0)
MCV: 96.9 fL (ref 80.0–100.0)
Platelets: 270 10*3/uL (ref 150–400)
RBC: 4.26 MIL/uL (ref 4.22–5.81)
RDW: 14.3 % (ref 11.5–15.5)
WBC: 19.5 10*3/uL — ABNORMAL HIGH (ref 4.0–10.5)
nRBC: 0 % (ref 0.0–0.2)

## 2022-08-29 MED ORDER — METRONIDAZOLE 500 MG PO TABS: 500.0000 mg | ORAL_TABLET | Freq: Three times a day (TID) | ORAL | 0 refills | Status: AC

## 2022-08-29 MED ORDER — IBUPROFEN 600 MG PO TABS: 600.0000 mg | ORAL_TABLET | Freq: Four times a day (QID) | ORAL | 0 refills | Status: DC | PRN

## 2022-08-29 MED ORDER — HYDROCODONE-ACETAMINOPHEN 5-325 MG PO TABS: 1.0000 | ORAL_TABLET | ORAL | Status: DC | PRN

## 2022-08-29 MED ORDER — CIPROFLOXACIN HCL 500 MG PO TABS: 500.0000 mg | ORAL_TABLET | Freq: Two times a day (BID) | ORAL | 0 refills | Status: AC

## 2022-08-29 MED ORDER — OXYCODONE HCL 5 MG PO TABS: 5.0000 mg | ORAL_TABLET | Freq: Four times a day (QID) | ORAL | 0 refills | Status: DC | PRN

## 2022-08-29 NOTE — TOC Transition Note (Addendum)
Transition of Care [facility]) - CM/SW Discharge Note   Patient Details  Name: Phillip Nelson MRN: [id number] Date of Birth: 29-Mar-1934  [facility] CM/SW Contact:  Allena Katz, LCSW Phone Number: 08/29/2022, 10:17 AM   Clinical Narrative:  Pt has orders in to discharge home. Pt reports no discharge needs. He states he has seen Dr. Clent Ridges for primary care and has seen her in the past year. CSW offered HH to pt as he is leaving with a JP drain. Pt declined stating he feels comfortable managing at home. Pt reports being fully independent at home and has no home care needs. Pt drives to appointments and lives with his significant other joyce. No SDOH needs noted. CSW signing off.    2:10pm  Pt discharged and called and said he changed his mind and does want a HH RN. MD entered orders. Barbara Cower with [facility] accepted [facility] RN. Pt was notified of this.   Final next level of care: Home/Self Care Barriers to Discharge: Barriers Resolved   Patient Goals and CMS Choice CMS Medicare.gov Compare Post Acute Care list provided to:: Patient Choice offered to / list presented to : Patient  Discharge Placement                  Patient to be transferred to facility by: friend      Discharge Plan and Services Additional resources added to the After Visit Summary for                                       Social Determinants of Health (SDOH) Interventions SDOH Screenings   Food Insecurity: No Food Insecurity (08/28/2022)  Housing: Low Risk  (08/28/2022)  Transportation Needs: No Transportation Needs (08/28/2022)  Utilities: Not At Risk (08/28/2022)  Alcohol Screen: Low Risk  (09/12/2021)  Depression (PHQ2-9): Low Risk  (06/12/2022)  Financial Resource Strain: Low Risk  (09/12/2021)  Physical Activity: Insufficiently Active (09/12/2021)  Social Connections: Unknown (09/12/2021)  Stress: No Stress Concern Present (09/12/2021)  Tobacco Use: Medium Risk (08/28/2022)      Readmission Risk Interventions     No data to display

## 2022-08-29 NOTE — Discharge Instructions (Signed)
In addition to included general post-operative instructions,  Diet: Resume home diet. Recommend avoiding or limiting fatty/greasy foods over the next few days/week. If you do eat these, you may (or may not) notice diarrhea. This is expected while your body adjusts to not having a gallbladder, and it typically resolves with time.    Activity: No heavy lifting >20 pounds (children, pets, laundry, garbage) or strenuous activity for 4 weeks, but light activity and walking are encouraged. Do not drive or drink alcohol if taking narcotic pain medications or having pain that might distract from driving.  Drain: Monitor and record drain output daily; Hand out given. Please bring this with you to follow up appointment.   Wound care: If you can keep drain site covered and water proofed, you may shower/get incision wet with soapy water and pat dry (do not rub incisions), but no baths or submerging incision underwater until follow-up.   Medications: Resume all home medications. For mild to moderate pain: acetaminophen (Tylenol) or ibuprofen/naproxen (if no kidney disease). Combining Tylenol with alcohol can substantially increase your risk of causing liver disease. Narcotic pain medications, if prescribed, can be used for severe pain, though may cause nausea, constipation, and drowsiness. Do not combine Tylenol and Percocet (or similar) within a 6 hour period as Percocet (and similar) contain(s) Tylenol. If you do not need the narcotic pain medication, you do not need to fill the prescription.  Call office [phone] / [phone]) at any time if any questions, worsening pain, fevers/chills, bleeding, drainage from incision site, or other concerns.

## 2022-08-29 NOTE — Progress Notes (Signed)
[facility] SUR[facility]ICAL PROGRESS NOTE  Hospital Day(s): 2.   Post op day(s): 1 Day Post-Op.   Interval History:  Patient seen and examined No acute events or new complaints overnight.  Patient reports he feels significantly better Abdominal soreness mainly at drain site No fever, chills, nausea, emesis  Leukocytosis slightly worse; 19.5K Hgb stable at 13.5 Renal function normal; sCr - 1.14; UO - 450 ccs Surgical drain with 60 ccs out; serous Diet advanced; tolerating Anxious to go home   Vital signs in last 24 hours: [min-max] current  Temp:  [97.5 F (36.4 C)-98.9 F (37.2 C)] 97.7 F (36.5 C) (06/04 0833) Pulse Rate:  [68-95] 84 (06/04 0833) Resp:  [11-22] 18 (06/04 0833) BP: (111-182)/(53-82) 111/53 (06/04 0833) SpO2:  [94 %-100 %] 94 % (06/04 0833) Weight:  [90.7 kg] 90.7 kg (06/03 1651)     Height: 5\' 10"  (177.8 cm) Weight: 90.7 kg BMI (Calculated): 28.7   Intake/Output last 2 shifts:  06/03 0701 - 06/04 0700 In: 1193.7 [I.V.:893.7; IV Piggyback:300] Out: 720 [Urine:450; Drains:60; Blood:10]   Physical Exam:  Constitutional: alert, cooperative and no distress  Respiratory: breathing non-labored at rest  Cardiovascular: regular rate and sinus rhythm  Gastrointestinal: soft, incisional soreness, non-distended, no rebound/guarding. Surgical drain in right lateral port site; output serous Integumentary: laparoscopic incisions are CDI with dermabond, no erythema   Labs:     Latest Ref Rng & Units 08/29/2022    5:09 AM 08/28/2022    4:16 AM 08/27/2022    7:47 PM  CBC  WBC 4.0 - 10.5 K/uL 19.5  17.8  18.9   Hemoglobin 13.0 - 17.0 g/dL 16.1  09.6  04.5   Hematocrit 39.0 - 52.0 % 41.3  42.6  44.3   Platelets 150 - 400 K/uL 270  278  323       Latest Ref Rng & Units 08/29/2022    5:09 AM 08/28/2022    4:16 AM 08/27/2022    7:47 PM  CMP  Glucose 70 - 99 mg/dL 409  811  914   BUN 8 - 23 mg/dL 23  13  13    Creatinine 0.61 - 1.24 mg/dL 7.82  9.56  2.13    Sodium 135 - 145 mmol/L 135  134  134   Potassium 3.5 - 5.1 mmol/L 4.2  4.0  3.9   Chloride 98 - 111 mmol/L 103  101  101   CO2 22 - 32 mmol/L 23  25  26    Calcium 8.9 - 10.3 mg/dL 8.0  8.6  9.0   Total Protein 6.5 - 8.1 g/dL 6.3  7.5  7.9   Total Bilirubin 0.3 - 1.2 mg/dL 0.8  1.3  1.2   Alkaline Phos 38 - 126 U/L 52  66  74   AST 15 - 41 U/L 55  28  28   ALT 0 - 44 U/L 38  26  28      Imaging studies: No new pertinent imaging studies   Assessment/Plan:  88 y.o. male 1 Day Post-Op s/p robotic assisted laparoscopic cholecystectomy for acute cholecystitis   - Continue regular diet; Reviewed dietary recommendations  - Continue surgical drain; monitor and record out - He will go home with this  - Continue IV Abx; will send with 7 days Cipro/Flagyl for home  - Monitor abdominal examination    - Pain control prn; antiemetics prn - Mobilize    - Further management per primary service   - Discharge Planning;  Okay for discharge from surgical perspective. Will follow up in ~1 week for drain removal. I did send his pain medication and Abx Rx. Instructions updated.   All of the above findings and recommendations were discussed with the patient, and the medical team, and all of patient's questions were answered to his expressed satisfaction.  -- Lynden Oxford, PA-C [facility] 08/29/2022, 10:06 AM M-F: 7am - 4pm

## 2022-08-29 NOTE — Plan of Care (Signed)
Pt alert and oriented x4. 1 assist to BR. Used urinal overnight. Laparoscopic cholecystectomy done yesterday. JP drain on R side. 30ml serosanguinous fluid emptied. Site noticed to be leaking. Dressing changed. Ice pack placed on ABD per pt request. NS @100 /h. VSS. Bed locked and in lowest position. Call bell in reach. Bed alarm on.   Problem: Education: Goal: Knowledge of General Education information will improve Description: Including pain rating scale, medication(s)/side effects and non-pharmacologic comfort measures Outcome: Progressing   Problem: Health Behavior/Discharge Planning: Goal: Ability to manage health-related needs will improve Outcome: Progressing   Problem: Clinical Measurements: Goal: Ability to maintain clinical measurements within normal limits will improve Outcome: Progressing Goal: Will remain free from infection Outcome: Progressing Goal: Diagnostic test results will improve Outcome: Progressing Goal: Respiratory complications will improve Outcome: Progressing Goal: Cardiovascular complication will be avoided Outcome: Progressing   Problem: Activity: Goal: Risk for activity intolerance will decrease Outcome: Progressing   Problem: Nutrition: Goal: Adequate nutrition will be maintained Outcome: Progressing   Problem: Coping: Goal: Level of anxiety will decrease Outcome: Progressing   Problem: Elimination: Goal: Will not experience complications related to bowel motility Outcome: Progressing Goal: Will not experience complications related to urinary retention Outcome: Progressing   Problem: Pain Managment: Goal: General experience of comfort will improve Outcome: Progressing   Problem: Safety: Goal: Ability to remain free from injury will improve Outcome: Progressing   Problem: Skin Integrity: Goal: Risk for impaired skin integrity will decrease Outcome: Progressing

## 2022-08-29 NOTE — Progress Notes (Signed)
Received MD order to discharge patient to home, reviewed discharge instructions, follow up appointments, home meds prescriptions and JP  drain and incision care instructions and patient verbalized understanding.

## 2022-08-29 NOTE — Discharge Summary (Addendum)
Physician Discharge Summary   Patient: Phillip Nelson MRN: [id number]  DOB: 21-Aug-1934   Admit:     Date of Admission: 08/27/2022 Admitted from: home   Discharge: Date of discharge: 08/29/22 Disposition: Home with home health Condition at discharge: good  CODE STATUS: FULL CODE      Discharge Physician: Sunnie Nielsen, DO [facility]     PCP: Dana Allan, MD  Recommendations for Outpatient Follow-up:  Follow up with PCP Dana Allan, MD in 2-4 weeks Please obtain labs/tests: CMP, CBC in 2-4 weeks Please follow up on the following pending results: surgical pathology FOllow w/ general surgery later this week or early next week for drain removal  PCP AND OTHER OUTPATIENT PROVIDERS: SEE BELOW FOR SPECIFIC DISCHARGE INSTRUCTIONS PRINTED FOR PATIENT IN ADDITION TO GENERIC AVS PATIENT INFO     Discharge Instructions     Call MD for:  persistant nausea and vomiting   Complete by: As directed    Call MD for:  redness, tenderness, or signs of infection (pain, swelling, redness, odor or green/yellow discharge around incision site)   Complete by: As directed    Call MD for:  severe uncontrolled pain   Complete by: As directed    Call MD for:  temperature >100.4   Complete by: As directed    Diet - low sodium heart healthy   Complete by: As directed    Increase activity slowly   Complete by: As directed          Discharge Diagnoses: Principal Problem:   Acute cholecystitis Active Problems:   Coronary artery disease involving native coronary artery of native heart without angina pectoris   Hyperlipidemia with target LDL less than 70   OSA (obstructive sleep apnea)   Gout   Anemia       Hospital Course: Phillip Nelson is a 88 y.o. male with medical history significant of GI bleed, colon cancer s/p hemicolectomy with primary anastomosis in 09/2021, HLD, OSA with CPAP presented to ED 08/27/22 for abdominal pain. Day prior, he started having  stabbing right upper quadrant abdominal pain with nausea.  06/02: right upper quadrant ultrasound with gallbladder sludge however CT renal showed acute cholecystitis and a nonobstructing left renal stone. Not meeting sepsis criteria but did have elevation WBC. He was given Toradol 10 mg. ED provider ordered ceftriaxone and Flagyl in the ED. Admitted to hospitalist service w/ general surgery consult  06/03: plan for OR today per Dr Toula Moos.  06/04: seen this morning, pt eager for dc home. Spoke w/ surgical PA, ok for discharge on po abx and follow closely in office for drain care, plan remove drain later this week or early next week   Consultants:  General surgery   Procedures: 06/03: laparoscopic cholecystectomy       ASSESSMENT & PLAN:   Principal Problem:   Acute cholecystitis Active Problems:   Coronary artery disease involving native coronary artery of native heart without angina pectoris   Hyperlipidemia with target LDL less than 70   OSA (obstructive sleep apnea)   Gout   Anemia  Acute Cholecystitis POD1 s/p cholecystectomy   Po abx w/ cipro/flagyl per gen surg team  follow CBC, CMP    OSA  Encourage CPAP qhs    Gout  Chronic and stable  restart home allupurinol   Iron Deficiency Anemia with B12 Deficiency  Hemoglobin stable 14.8. Recent Vit B12 238.  Hold home Fusion plus capsule    Coronary Artery Disease  HLD  Crestor discontinued due to myalgias             Discharge Instructions  Allergies as of 08/29/2022       Reactions   Indocin [indomethacin] Other (See Comments)   Shakes. tremors   Iodinated Contrast Media Hives   Penicillins Itching, Rash, Other (See Comments)   Did it involve swelling of the face/tongue/throat, SOB, or low BP? Unknown Did it involve sudden or severe rash/hives, skin peeling, or any reaction on the inside of your mouth or nose? Unknown Did you need to seek medical attention at a hospital or doctor's office?  Unknown When did it last happen? unknown If all above answers are "NO", may proceed with cephalosporin use.        Medication List     TAKE these medications    allopurinol 300 MG tablet Commonly known as: ZYLOPRIM Take 1 tablet (300 mg total) by mouth daily.   ciprofloxacin 500 MG tablet Commonly known as: Cipro Take 1 tablet (500 mg total) by mouth 2 (two) times daily for 7 days.   Fusion Plus Caps Take 1 capsule by mouth every other day.   ibuprofen 600 MG tablet Commonly known as: ADVIL Take 1 tablet (600 mg total) by mouth every 6 (six) hours as needed.   metroNIDAZOLE 500 MG tablet Commonly known as: Flagyl Take 1 tablet (500 mg total) by mouth 3 (three) times daily for 7 days.   oxyCODONE 5 MG immediate release tablet Commonly known as: Oxy IR/ROXICODONE Take 1 tablet (5 mg total) by mouth every 6 (six) hours as needed for severe pain or breakthrough pain.   Potassium 99 MG Tabs Take 1 tablet by mouth daily as needed (leg cramps).         Follow-up Information     Donovan Kail, PA-C. Schedule an appointment as soon as possible for a visit on 09/05/2022.   Specialty: Physician Assistant Why: s/p lap cholecystectomy; with drain Contact information: [address] [place] [address] [phone]                 Allergies  Allergen Reactions   Indocin [Indomethacin] Other (See Comments)    Shakes. tremors   Iodinated Contrast Media Hives   Penicillins Itching, Rash and Other (See Comments)    Did it involve swelling of the face/tongue/throat, SOB, or low BP? Unknown Did it involve sudden or severe rash/hives, skin peeling, or any reaction on the inside of your mouth or nose? Unknown Did you need to seek medical attention at a hospital or doctor's office? Unknown When did it last happen? unknown If all above answers are "NO", may proceed with cephalosporin use.      Subjective: pt feeling well other than some leaking  serous fluid from surgical site. Pain is controlled. Tolerating diet.    Discharge Exam: BP (!) 111/53 (BP Location: Left Arm)   Pulse 84   Temp 97.7 F (36.5 C)   Resp 18   Ht 5\' 10"  (1.778 m)   Wt 90.7 kg   SpO2 94%   BMI 28.70 kg/m  General: Pt is alert, awake, not in acute distress Cardiovascular: RRR, S1/S2 +, no rubs, no gallops Respiratory: CTA bilaterally, no wheezing, no rhonchi Abdominal: Soft, NT, ND, bowel sounds. NO erythema or tenderness at drain site or incisions. Drain buld (+)serosanguinous fluid, bandage surrounding site serous fluid  Extremities: no edema, no cyanosis     The results of significant diagnostics from this  hospitalization (including imaging, microbiology, ancillary and laboratory) are listed below for reference.     Microbiology: Recent Results (from the past 240 hour(s))  Aerobic/Anaerobic Culture w Gram Stain (surgical/deep wound)     Status: None (Preliminary result)   Collection Time: 08/28/22  6:40 PM   Specimen: Gallbladder; GI  Result Value Ref Range Status   Specimen Description FLUID  Final   Special Requests BILE  Final   Gram Stain NO WBC SEEN NO ORGANISMS SEEN   Final   Culture   Final    NO GROWTH < 12 HOURS Performed at [facility], [address], [place], [place] [address]    Report Status PENDING  Incomplete     Labs: BNP (last 3 results) No results for input(s): "BNP" in the last 8760 hours. Basic Metabolic Panel: Recent Labs  Lab 08/27/22 1947 08/28/22 0416 08/29/22 0509  NA 134* 134* 135  K 3.9 4.0 4.2  CL 101 101 103  CO2 26 25 23   GLUCOSE 108* 124* 149*  BUN 13 13 23   CREATININE 0.99 0.92 1.14  CALCIUM 9.0 8.6* 8.0*   Liver Function Tests: Recent Labs  Lab 08/27/22 1947 08/28/22 0416 08/29/22 0509  AST 28 28 55*  ALT 28 26 38  ALKPHOS 74 66 52  BILITOT 1.2 1.3* 0.8  PROT 7.9 7.5 6.3*  ALBUMIN 4.5 4.1 3.1*   Recent Labs  Lab 08/27/22 1947  LIPASE 32   No results for input(s):  "AMMONIA" in the last 168 hours. CBC: Recent Labs  Lab 08/27/22 1947 08/28/22 0416 08/29/22 0509  WBC 18.9* 17.8* 19.5*  HGB 14.8 14.1 13.5  HCT 44.3 42.6 41.3  MCV 95.9 96.6 96.9  PLT 323 278 270   Cardiac Enzymes: No results for input(s): "CKTOTAL", "CKMB", "CKMBINDEX", "TROPONINI" in the last 168 hours. BNP: Invalid input(s): "POCBNP" CBG: No results for input(s): "GLUCAP" in the last 168 hours. D-Dimer No results for input(s): "DDIMER" in the last 72 hours. Hgb A1c No results for input(s): "HGBA1C" in the last 72 hours. Lipid Profile No results for input(s): "CHOL", "HDL", "LDLCALC", "TRIG", "CHOLHDL", "LDLDIRECT" in the last 72 hours. Thyroid function studies No results for input(s): "TSH", "T4TOTAL", "T3FREE", "THYROIDAB" in the last 72 hours.  Invalid input(s): "FREET3" Anemia work up No results for input(s): "VITAMINB12", "FOLATE", "FERRITIN", "TIBC", "IRON", "RETICCTPCT" in the last 72 hours. Urinalysis    Component Value Date/Time   COLORURINE YELLOW (A) 08/27/2022 2323   APPEARANCEUR CLEAR (A) 08/27/2022 2323   LABSPEC 1.008 08/27/2022 2323   PHURINE 6.0 08/27/2022 2323   GLUCOSEU NEGATIVE 08/27/2022 2323   HGBUR NEGATIVE 08/27/2022 2323   BILIRUBINUR NEGATIVE 08/27/2022 2323   KETONESUR NEGATIVE 08/27/2022 2323   PROTEINUR NEGATIVE 08/27/2022 2323   NITRITE NEGATIVE 08/27/2022 2323   LEUKOCYTESUR NEGATIVE 08/27/2022 2323   Sepsis Labs Recent Labs  Lab 08/27/22 1947 08/28/22 0416 08/29/22 0509  WBC 18.9* 17.8* 19.5*   Microbiology Recent Results (from the past 240 hour(s))  Aerobic/Anaerobic Culture w Gram Stain (surgical/deep wound)     Status: None (Preliminary result)   Collection Time: 08/28/22  6:40 PM   Specimen: Gallbladder; GI  Result Value Ref Range Status   Specimen Description FLUID  Final   Special Requests BILE  Final   Gram Stain NO WBC SEEN NO ORGANISMS SEEN   Final   Culture   Final    NO GROWTH < 12 HOURS Performed at  [facility], [address] [place],  [place] [address]    Report Status PENDING  Incomplete   Imaging CT Renal Stone Study  Result Date: 08/27/2022 CLINICAL DATA:  Abdominal and flank pain. History of colon cancer and prostate cancer. EXAM: CT ABDOMEN AND PELVIS WITHOUT CONTRAST TECHNIQUE: Multidetector CT imaging of the abdomen and pelvis was performed following the standard protocol without IV contrast. RADIATION DOSE REDUCTION: This exam was performed according to the departmental dose-optimization program which includes automated exposure control, adjustment of the mA and/or kV according to patient size and/or use of iterative reconstruction technique. COMPARISON:  Abdominal ultrasound 08/27/2022. CT abdomen and pelvis 10/19/2021. FINDINGS: Lower chest: No acute abnormality. Hepatobiliary: There is inflammatory stranding surrounding the gallbladder. There is no biliary ductal dilatation. The liver is within normal limits. Pancreas: Unremarkable. No pancreatic ductal dilatation or surrounding inflammatory changes. Spleen: Normal in size without focal abnormality. Adrenals/Urinary Tract: Right renal cysts are present measuring up to 4.3 cm. There is no hydronephrosis or perinephric fat stranding in either kidney. There is a single punctate calculus in the left kidney. The adrenal glands and bladder are within normal limits. Stomach/Bowel: Right hemicolectomy changes are again noted. There is no bowel obstruction, pneumatosis, wall thickening or inflammation. There is sigmoid colon diverticulosis. There is an area of mesenteric fat necrosis in the right abdomen. Stomach is within normal limits. No free air. Vascular/Lymphatic: Aortic atherosclerosis. No enlarged abdominal or pelvic lymph nodes. Reproductive: Prostate radiotherapy seeds are present. Other: There are small fat containing umbilical hernias. There is no ascites. Musculoskeletal: Degenerative changes affect the spine. No focal osseous  lesions are seen. IMPRESSION: 1. Findings compatible with acute cholecystitis. 2. Nonobstructing left renal calculus. 3. Colonic diverticulosis. 4. Right Bosniak I benign renal cyst measuring 4.3 cm. No follow-up imaging is recommended. JACR 2018 Feb; 264-273, Management of the Incidental Renal Mass on CT, [employer] 2021; 814-848, Bosniak Classification of Cystic Renal Masses, Version 2019. Aortic Atherosclerosis (ICD10-I70.0). Electronically Signed   By: Darliss Cheney M.D.   On: 08/27/2022 22:39   US ABDOMEN LIMITED RUQ (LIVER/GB)  Result Date: 08/27/2022 CLINICAL DATA:  Right upper quadrant pain EXAM: ULTRASOUND ABDOMEN LIMITED RIGHT UPPER QUADRANT COMPARISON:  10/19/2021 FINDINGS: Gallbladder: Echogenic sludge fills the gallbladder lumen. No shadowing gallstones. No evidence of acute cholecystitis. Negative sonographic Murphy sign. Common bile duct: Diameter: 6 mm Liver: Increased liver echotexture most consistent with hepatic steatosis. No focal liver abnormality or intrahepatic biliary duct dilation. Portal vein is patent on color Doppler imaging with normal direction of blood flow towards the liver. Other: None. IMPRESSION: 1. Gallbladder sludge. No evidence of cholelithiasis or cholecystitis. 2. Increased liver echotexture consistent with hepatic steatosis. Electronically Signed   By: Sharlet Salina M.D.   On: 08/27/2022 21:10   DG Chest Portable 1 View  Result Date: 08/27/2022 CLINICAL DATA:  Hypoxia. Right upper quadrant pain. EXAM: PORTABLE CHEST 1 VIEW COMPARISON:  Chest from acute abdomen series 10/15/2021 FINDINGS: Prior median sternotomy. The heart is normal in size. Mediastinal contours are normal. No focal airspace disease, pulmonary edema, pleural effusion or pneumothorax. Chronic right shoulder arthropathy. IMPRESSION: No acute abnormality. Electronically Signed   By: Narda Rutherford M.D.   On: 08/27/2022 20:21      Time coordinating discharge: over 30 minutes  SIGNED:  Sunnie Nielsen DO [facility]

## 2022-08-30 ENCOUNTER — Telehealth: Payer: Self-pay | Admitting: *Deleted

## 2022-08-30 DIAGNOSIS — Z8546 Personal history of malignant neoplasm of prostate: Secondary | ICD-10-CM | POA: Diagnosis not present

## 2022-08-30 DIAGNOSIS — Z9049 Acquired absence of other specified parts of digestive tract: Secondary | ICD-10-CM | POA: Diagnosis not present

## 2022-08-30 DIAGNOSIS — E785 Hyperlipidemia, unspecified: Secondary | ICD-10-CM | POA: Diagnosis not present

## 2022-08-30 DIAGNOSIS — Z951 Presence of aortocoronary bypass graft: Secondary | ICD-10-CM | POA: Diagnosis not present

## 2022-08-30 DIAGNOSIS — Z85828 Personal history of other malignant neoplasm of skin: Secondary | ICD-10-CM | POA: Diagnosis not present

## 2022-08-30 DIAGNOSIS — M109 Gout, unspecified: Secondary | ICD-10-CM | POA: Diagnosis not present

## 2022-08-30 DIAGNOSIS — G4733 Obstructive sleep apnea (adult) (pediatric): Secondary | ICD-10-CM | POA: Diagnosis not present

## 2022-08-30 DIAGNOSIS — Z48815 Encounter for surgical aftercare following surgery on the digestive system: Secondary | ICD-10-CM | POA: Diagnosis not present

## 2022-08-30 DIAGNOSIS — D519 Vitamin B12 deficiency anemia, unspecified: Secondary | ICD-10-CM | POA: Diagnosis not present

## 2022-08-30 DIAGNOSIS — I10 Essential (primary) hypertension: Secondary | ICD-10-CM | POA: Diagnosis not present

## 2022-08-30 DIAGNOSIS — I2510 Atherosclerotic heart disease of native coronary artery without angina pectoris: Secondary | ICD-10-CM | POA: Diagnosis not present

## 2022-08-30 DIAGNOSIS — Z85038 Personal history of other malignant neoplasm of large intestine: Secondary | ICD-10-CM | POA: Diagnosis not present

## 2022-08-30 DIAGNOSIS — D509 Iron deficiency anemia, unspecified: Secondary | ICD-10-CM | POA: Diagnosis not present

## 2022-08-30 LAB — AEROBIC/ANAEROBIC CULTURE W GRAM STAIN (SURGICAL/DEEP WOUND)

## 2022-08-30 NOTE — Transitions of Care (Post Inpatient/ED Visit) (Signed)
08/30/2022  Name: Phillip Nelson MRN: [id number] DOB: [birth year]  Today's TOC FU Call Status: Today's TOC FU Call Status:: Successful TOC FU Call Competed TOC FU Call Complete Date: 08/30/22  Transition Care Management Follow-up Telephone Call Date of Discharge: 08/29/22 Discharge Facility: [facility]) Type of Discharge: Inpatient Admission Primary Inpatient Discharge Diagnosis:: Acute cholecystitis How have you been since you were released from the hospital?: Better Any questions or concerns?: Yes Patient Questions/Concerns:: Patient has some concerns regarding the decrease amount of drainage from his JP drain Patient Questions/Concerns Addressed: Other: (RN educated patient on drain care)  Items Reviewed: Did you receive and understand the discharge instructions provided?: Yes Medications obtained,verified, and reconciled?: Yes (Medications Reviewed) Any new allergies since your discharge?: No Dietary orders reviewed?: Yes Type of Diet Ordered:: Low sodium heart healthy Do you have support at home?: Yes People in Home: significant other Name of Support/Comfort Primary Source: Thayer Ohm son  Medications Reviewed Today: Medications Reviewed Today     Reviewed by Luella Cook, RN (Case Manager) on 08/30/22 at 1056  Med List Status: <None>   Medication Order Taking? Sig Documenting Provider Last Dose Status Informant  allopurinol (ZYLOPRIM) 300 MG tablet [id number] Yes Take 1 tablet (300 mg total) by mouth daily. Dana Allan, MD Taking Active Self, Pharmacy Records, Multiple Informants, Other  ciprofloxacin (CIPRO) 500 MG tablet [id number] Yes Take 1 tablet (500 mg total) by mouth 2 (two) times daily for 7 days. Donovan Kail, PA-C Taking Active   ibuprofen (ADVIL) 600 MG tablet [id number] Yes Take 1 tablet (600 mg total) by mouth every 6 (six) hours as needed. Donovan Kail, PA-C Taking Active   Iron-FA-B Cmp-C-Biot-Probiotic (FUSION PLUS)  CAPS [id number] Yes Take 1 capsule by mouth every other day. Toney Reil, MD Taking Active Pharmacy Records, Multiple Informants, Other, Self  metroNIDAZOLE (FLAGYL) 500 MG tablet [id number] Yes Take 1 tablet (500 mg total) by mouth 3 (three) times daily for 7 days. Donovan Kail, PA-C Taking Active   oxyCODONE (OXY IR/ROXICODONE) 5 MG immediate release tablet [id number] Yes Take 1 tablet (5 mg total) by mouth every 6 (six) hours as needed for severe pain or breakthrough pain. Donovan Kail, PA-C Taking Active   Potassium 99 MG TABS [id number] Yes Take 1 tablet by mouth daily as needed (leg cramps). [provider] Taking Active Self, Pharmacy Records, Multiple Informants, Other            Home Care and Equipment/Supplies: Were Home Health Services Ordered?: Yes Name of Home Health Agency:: [facility] Has Agency set up a time to come to your home?: Yes First Home Health Visit Date: 08/30/22 Any new equipment or medical supplies ordered?: NA  Functional Questionnaire: Do you need assistance with bathing/showering or dressing?: No Do you need assistance with meal preparation?: Yes Do you need assistance with eating?: No Do you have difficulty maintaining continence: No Do you need assistance with getting out of bed/getting out of a chair/moving?: No Do you have difficulty managing or taking your medications?: No  Follow up appointments reviewed: PCP Follow-up appointment confirmed?: NA Specialist Hospital Follow-up appointment confirmed?: Yes Date of Specialist follow-up appointment?: 09/05/22 Follow-Up Specialty Provider:: Lynden Oxford PA 1:45 Do you need transportation to your follow-up appointment?: No Do you understand care options if your condition(s) worsen?: Yes-patient verbalized understanding  SDOH Interventions Today    Flowsheet Row Most Recent Value  SDOH Interventions   Food Insecurity Interventions Intervention Not Indicated  Housing  Interventions Intervention Not Indicated  Transportation Interventions Intervention Not Indicated      Interventions Today    Flowsheet Row Most Recent Value  General Interventions   General Interventions Discussed/Reviewed General Interventions Discussed, General Interventions Reviewed, Doctor Visits  Doctor Visits Discussed/Reviewed Doctor Visits Discussed, Doctor Visits Reviewed, Specialist  Nutrition Interventions   Nutrition Discussed/Reviewed Nutrition Discussed, Nutrition Reviewed  Pharmacy Interventions   Pharmacy Dicussed/Reviewed Pharmacy Topics Discussed, Pharmacy Topics Reviewed      TOC Interventions Today    Flowsheet Row Most Recent Value  TOC Interventions   TOC Interventions Discussed/Reviewed TOC Interventions Discussed, TOC Interventions Reviewed       Gean Maidens BSN RN [facility] [phone]

## 2022-09-01 DIAGNOSIS — Z48815 Encounter for surgical aftercare following surgery on the digestive system: Secondary | ICD-10-CM | POA: Diagnosis not present

## 2022-09-01 DIAGNOSIS — I10 Essential (primary) hypertension: Secondary | ICD-10-CM | POA: Diagnosis not present

## 2022-09-01 DIAGNOSIS — E785 Hyperlipidemia, unspecified: Secondary | ICD-10-CM | POA: Diagnosis not present

## 2022-09-01 DIAGNOSIS — I2510 Atherosclerotic heart disease of native coronary artery without angina pectoris: Secondary | ICD-10-CM | POA: Diagnosis not present

## 2022-09-01 DIAGNOSIS — D519 Vitamin B12 deficiency anemia, unspecified: Secondary | ICD-10-CM | POA: Diagnosis not present

## 2022-09-01 DIAGNOSIS — D509 Iron deficiency anemia, unspecified: Secondary | ICD-10-CM | POA: Diagnosis not present

## 2022-09-02 LAB — AEROBIC/ANAEROBIC CULTURE W GRAM STAIN (SURGICAL/DEEP WOUND): Gram Stain: NONE SEEN

## 2022-09-04 DIAGNOSIS — I2510 Atherosclerotic heart disease of native coronary artery without angina pectoris: Secondary | ICD-10-CM | POA: Diagnosis not present

## 2022-09-04 DIAGNOSIS — D509 Iron deficiency anemia, unspecified: Secondary | ICD-10-CM | POA: Diagnosis not present

## 2022-09-04 DIAGNOSIS — D519 Vitamin B12 deficiency anemia, unspecified: Secondary | ICD-10-CM | POA: Diagnosis not present

## 2022-09-04 DIAGNOSIS — E785 Hyperlipidemia, unspecified: Secondary | ICD-10-CM | POA: Diagnosis not present

## 2022-09-04 DIAGNOSIS — Z48815 Encounter for surgical aftercare following surgery on the digestive system: Secondary | ICD-10-CM | POA: Diagnosis not present

## 2022-09-04 DIAGNOSIS — I10 Essential (primary) hypertension: Secondary | ICD-10-CM | POA: Diagnosis not present

## 2022-09-04 LAB — SURGICAL PATHOLOGY

## 2022-09-05 ENCOUNTER — Ambulatory Visit (INDEPENDENT_AMBULATORY_CARE_PROVIDER_SITE_OTHER): Payer: Medicare Other | Admitting: Physician Assistant

## 2022-09-05 ENCOUNTER — Encounter: Payer: Self-pay | Admitting: Physician Assistant

## 2022-09-05 VITALS — BP 128/84 | HR 77 | Temp 97.9°F | Ht 70.0 in | Wt 207.0 lb

## 2022-09-05 DIAGNOSIS — Z09 Encounter for follow-up examination after completed treatment for conditions other than malignant neoplasm: Secondary | ICD-10-CM

## 2022-09-05 DIAGNOSIS — K810 Acute cholecystitis: Secondary | ICD-10-CM

## 2022-09-05 NOTE — Progress Notes (Signed)
[facility] POST-OP OFFICE VISIT  09/05/2022  HPI: Phillip Nelson is a 88 y.o. male 8 days s/p robotic assisted laparoscopic cholecystectomy for acute gangrenous cholecystitis   He is doing remarkably well given the circumstance Very minimal right sided soreness No fever, chills, nausea, emesis He is tolerating PO; no diarrhea Incisions are well healed Drain with serous output; <30 ccs daily No other complaints   Vital signs: BP 128/84   Pulse 77   Temp 97.9 F (36.6 C) (Oral)   Ht 5\' 10"  (1.778 m)   Wt 207 lb (93.9 kg)   SpO2 97%   BMI 29.70 kg/m    Physical Exam: Constitutional: Well appearing male, NAD Abdomen: Soft, non-tender, non-distended, no rebound/guarding. Surgical drain in right lateral port site; output serous (removed) Skin: Laparoscopic incisions are healing well, no erythema or drainage   Assessment/Plan: This is a 88 y.o. male 8 days s/p robotic assisted laparoscopic cholecystectomy for acute gangrenous cholecystitis    - Drain removed without incident; dressing place; reviewed care  - Pain control prn  - Reviewed wound care recommendation  - Reviewed lifting restrictions; 4 weeks total  - Reviewed surgical pathology; gangrenous cholecystitis   - He can follow up on as needed basis; He understands to call with questions/concerns  -- Lynden Oxford, PA-C [facility] 09/05/2022, 2:12 PM M-F: 7am - 4pm

## 2022-09-05 NOTE — Patient Instructions (Signed)
Cholecystostomy Cholecystostomy is a procedure to drain fluid from the gallbladder using a flexible drainage tube (catheter). The gallbladder is a pear-shaped organ that lies beneath the liver on the right side of the body. The gallbladder stores a fluid that helps the body digest fats (bile). You may need this procedure: If your gallbladder is irritated and swollen (cholecystitis). This condition may be caused by stones or lumps that form in the gallbladder (gallstones). Gallstones can block the tube (duct) that carries bile out of your gallbladder. To control a gallbladder infection if you cannot have gallbladder surgery. Tell a health care provider about: Any allergies you have. All medicines you are taking, including vitamins, herbs, eye drops, creams, and over-the-counter medicines. Any problems you or family members have had with anesthetic medicines. Any bleeding problems you have. Any surgeries you have had. Any medical conditions you have. Whether you are pregnant or may be pregnant. What are the risks? Generally, this is a safe procedure. However, problems may occur, including: The catheter moving out of place or becoming clogged. Infection at the incision site or inside the abdomen (peritonitis). Internal bleeding. Leakage of bile from the gallbladder. Damage to other structures or organs. Low blood pressure and slowed heart rate. Allergic reactions to medicines or dyes. What happens before the procedure? When to stop eating and drinking Follow instructions from your health care provider about what you may eat and drink before your procedure. These may include: 8 hours before your procedure Stop eating most foods. Do not eat meat, fried foods, or fatty foods. Eat only light foods, such as toast or crackers. All liquids are okay except energy drinks and alcohol. 6 hours before your procedure Stop eating. Drink only clear liquids, such as water, clear fruit juice, black coffee,  plain tea, and sports drinks. Do not drink energy drinks or alcohol. 2 hours before your procedure Stop drinking all liquids. You may be allowed to take medicines with small sips of water. If you do not follow your health care provider's instructions, your procedure may be delayed or canceled. Medicines Ask your health care provider about: Changing or stopping your regular medicines. This is especially important if you are taking diabetes medicines or blood thinners. Taking medicines such as aspirin and ibuprofen. These medicines can thin your blood. Do not take these medicines unless your health care provider tells you to take them. Taking over-the-counter medicines, vitamins, herbs, and supplements. General instructions You may have an exam or testing, including: Imaging studies of your abdomen and gallbladder. Blood tests. Do not use any products that contain nicotine or tobacco. These products include cigarettes, chewing tobacco, and vaping devices, such as e-cigarettes. If you need help quitting, ask your health care provider. If you will be going home right after the procedure, plan to have a responsible adult: Take you home from the hospital or clinic. You will not be allowed to drive. Care for you for the time you are told. Ask your health care provider: How your surgery site will be marked. What steps will be taken to help prevent infection. These steps may include: Removing hair at the surgery site. Washing skin with a germ-killing soap. Taking antibiotic medicine. What happens during the procedure? An IV will be inserted into one of your veins. You will be given one or more of the following: A medicine to help you relax (sedative). A medicine to numb the area (local anesthetic). A medicine to make you fall asleep (general anesthetic). A small incision will   be made in your abdomen, above your gallbladder. A long needle or a wide puncturing tool (trocar) will be put through  the incision. Your health care provider will use an imaging study (ultrasound or CT scan) to guide the needle or trocar into your gallbladder. After the needle or trocar is in your gallbladder, a small amount of dye may be injected. An X-ray may be taken to make sure that the needle is in the correct place. A catheter will be placed into your gallbladder. Your health care provider may remove any gallstones, if they are present. The needle or trocar will be removed. The catheter will be secured to your skin with stitches (sutures). The catheter will be connected to a drainage bag. Bile will drain from the gallbladder into the bag. Some bile may be sent to the lab to be tested. A bandage (dressing) will be placed over the incision site where the catheter was placed. The procedure may vary among health care providers and hospitals. What happens after the procedure? Your blood pressure, heart rate, breathing rate, and blood oxygen level will be monitored until you leave the hospital or clinic. Dye may be injected through your catheter to check the catheter and your gallbladder. Your gallbladder may be flushed out (irrigated) through the catheter. Your catheter and drainage bag may need to stay in place for several weeks or as told by your health care provider. You will be given IV fluids. Summary Cholecystostomy is a procedure to drain bile from the gallbladder using a flexible drainage tube (catheter). Generally, this is a safe procedure. However, problems may occur, including bleeding, infection, clogging or dislodging of the catheter, damage to other structures or organs, or low blood pressure and slowed heart rate. Follow instructions before the procedure. You will be told when to stop all food and drink, whether to change or stop any medicines, and what tests need to be done. After the procedure, you will be monitored in the hospital or clinic, and you may have a catheter and drainage bag when  you go home. This information is not intended to replace advice given to you by your health care provider. Make sure you discuss any questions you have with your health care provider. Document Revised: 09/14/2020 Document Reviewed: 09/14/2020 Elsevier Patient Education  2024 [employer].

## 2022-09-08 DIAGNOSIS — I2510 Atherosclerotic heart disease of native coronary artery without angina pectoris: Secondary | ICD-10-CM | POA: Diagnosis not present

## 2022-09-08 DIAGNOSIS — D519 Vitamin B12 deficiency anemia, unspecified: Secondary | ICD-10-CM | POA: Diagnosis not present

## 2022-09-08 DIAGNOSIS — Z48815 Encounter for surgical aftercare following surgery on the digestive system: Secondary | ICD-10-CM | POA: Diagnosis not present

## 2022-09-08 DIAGNOSIS — I10 Essential (primary) hypertension: Secondary | ICD-10-CM | POA: Diagnosis not present

## 2022-09-08 DIAGNOSIS — E785 Hyperlipidemia, unspecified: Secondary | ICD-10-CM | POA: Diagnosis not present

## 2022-09-08 DIAGNOSIS — D509 Iron deficiency anemia, unspecified: Secondary | ICD-10-CM | POA: Diagnosis not present

## 2022-09-14 ENCOUNTER — Encounter: Payer: Medicare Other | Admitting: Physician Assistant

## 2022-09-14 ENCOUNTER — Ambulatory Visit (INDEPENDENT_AMBULATORY_CARE_PROVIDER_SITE_OTHER): Payer: Medicare Other

## 2022-09-14 ENCOUNTER — Encounter: Payer: Medicare Other | Admitting: *Deleted

## 2022-09-14 VITALS — BP 128/84 | Ht 70.0 in | Wt 197.0 lb

## 2022-09-14 DIAGNOSIS — Z0000 Encounter for general adult medical examination without abnormal findings: Secondary | ICD-10-CM | POA: Diagnosis not present

## 2022-09-14 NOTE — Patient Instructions (Signed)
Phillip Nelson , Thank you for taking time to come for your Medicare Wellness Visit. I appreciate your ongoing commitment to your health goals. Please review the following plan we discussed and let me know if I can assist you in the future.   These are the goals we discussed:  Goals      DIET - EAT MORE FRUITS AND VEGETABLES     DIET - INCREASE WATER INTAKE     Recommend drinking at least 6-8 glasses of water a day      Patient Stated     09/09/2020, I will continue to play golf every Sunday for 4-5 hours.      Patient Stated     Goal is to stay out of the hospital. Has been hospitalized three times this year         This is a list of the screening recommended for you and due dates:  Health Maintenance  Topic Date Due   Zoster (Shingles) Vaccine (1 of 2) Never done   COVID-19 Vaccine (5 - 2023-24 season) 11/25/2021   Flu Shot  10/26/2022   Medicare Annual Wellness Visit  09/14/2023   DTaP/Tdap/Td vaccine (2 - Td or Tdap) 08/09/2024   Pneumonia Vaccine  Completed   HPV Vaccine  Aged Out    Advanced directives: Advance directive discussed with you today. Even though you declined this today, please call our office should you change your mind, and we can give you the proper paperwork for you to fill out. Advance care planning is a way to make decisions about medical care that fits your values in case you are ever unable to make these decisions for yourself.  Information on Advanced Care Planning can be found at [facility] Advance Health Care Directives Advance Health Care Directives ([email or web address]    Conditions/risks identified:  You are due for the following vaccines:Shingles vaccine and Covid booster You can have these done at your preferred pharmacy. Please have them send [place] documentation of the vaccines given so that we can update your chart.     Next appointment: VIRTUAL/ TELEPHONE VISIT Follow up in one year for your annual wellness visit  September 17, 2023 at 2:30pm    Preventive Care 54 Years and Older, Male  Preventive care refers to lifestyle choices and visits with your health care provider that can promote health and wellness. What does preventive care include? A yearly physical exam. This is also called an annual well check. Dental exams once or twice a year. Routine eye exams. Ask your health care provider how often you should have your eyes checked. Personal lifestyle choices, including: Daily care of your teeth and gums. Regular physical activity. Eating a healthy diet. Avoiding tobacco and drug use. Limiting alcohol use. Practicing safe sex. Taking low doses of aspirin every day. Taking vitamin and mineral supplements as recommended by your health care provider. What happens during an annual well check? The services and screenings done by your health care provider during your annual well check will depend on your age, overall health, lifestyle risk factors, and family history of disease. Counseling  Your health care provider may ask you questions about your: Alcohol use. Tobacco use. Drug use. Emotional well-being. Home and relationship well-being. Sexual activity. Eating habits. History of falls. Memory and ability to understand (cognition). Work and work Astronomer. Screening  You may have the following tests or measurements: Height, weight, and BMI. Blood pressure. Lipid and cholesterol levels. These may be  checked every 5 years, or more frequently if you are over 89 years old. Skin check. Lung cancer screening. You may have this screening every year starting at age 26 if you have a 30-pack-year history of smoking and currently smoke or have quit within the past 15 years. Fecal occult blood test (FOBT) of the stool. You may have this test every year starting at age 59. Flexible sigmoidoscopy or colonoscopy. You may have a sigmoidoscopy every 5 years or a colonoscopy every 10 years starting at age  58. Prostate cancer screening. Recommendations will vary depending on your family history and other risks. Hepatitis C blood test. Hepatitis B blood test. Sexually transmitted disease (STD) testing. Diabetes screening. This is done by checking your blood sugar (glucose) after you have not eaten for a while (fasting). You may have this done every 1-3 years. Abdominal aortic aneurysm (AAA) screening. You may need this if you are a current or former smoker. Osteoporosis. You may be screened starting at age 70 if you are at high risk. Talk with your health care provider about your test results, treatment options, and if necessary, the need for more tests. Vaccines  Your health care provider may recommend certain vaccines, such as: Influenza vaccine. This is recommended every year. Tetanus, diphtheria, and acellular pertussis (Tdap, Td) vaccine. You may need a Td booster every 10 years. Zoster vaccine. You may need this after age 5. Pneumococcal 13-valent conjugate (PCV13) vaccine. One dose is recommended after age 9. Pneumococcal polysaccharide (PPSV23) vaccine. One dose is recommended after age 6. Talk to your health care provider about which screenings and vaccines you need and how often you need them. This information is not intended to replace advice given to you by your health care provider. Make sure you discuss any questions you have with your health care provider. Document Released: 04/09/2015 Document Revised: 12/01/2015 Document Reviewed: 01/12/2015 Elsevier Interactive Patient Education  2017 [employer].  Fall Prevention in the Home Falls can cause injuries. They can happen to people of all ages. There are many things you can do to make your home safe and to help prevent falls. What can I do on the outside of my home? Regularly fix the edges of walkways and driveways and fix any cracks. Remove anything that might make you trip as you walk through a door, such as a raised step or  threshold. Trim any bushes or trees on the path to your home. Use bright outdoor lighting. Clear any walking paths of anything that might make someone trip, such as rocks or tools. Regularly check to see if handrails are loose or broken. Make sure that both sides of any steps have handrails. Any raised decks and porches should have guardrails on the edges. Have any leaves, snow, or ice cleared regularly. Use sand or salt on walking paths during winter. Clean up any spills in your garage right away. This includes oil or grease spills. What can I do in the bathroom? Use night lights. Install grab bars by the toilet and in the tub and shower. Do not use towel bars as grab bars. Use non-skid mats or decals in the tub or shower. If you need to sit down in the shower, use a plastic, non-slip stool. Keep the floor dry. Clean up any water that spills on the floor as soon as it happens. Remove soap buildup in the tub or shower regularly. Attach bath mats securely with double-sided non-slip rug tape. Do not have throw rugs and other  things on the floor that can make you trip. What can I do in the bedroom? Use night lights. Make sure that you have a light by your bed that is easy to reach. Do not use any sheets or blankets that are too big for your bed. They should not hang down onto the floor. Have a firm chair that has side arms. You can use this for support while you get dressed. Do not have throw rugs and other things on the floor that can make you trip. What can I do in the kitchen? Clean up any spills right away. Avoid walking on wet floors. Keep items that you use a lot in easy-to-reach places. If you need to reach something above you, use a strong step stool that has a grab bar. Keep electrical cords out of the way. Do not use floor polish or wax that makes floors slippery. If you must use wax, use non-skid floor wax. Do not have throw rugs and other things on the floor that can make you  trip. What can I do with my stairs? Do not leave any items on the stairs. Make sure that there are handrails on both sides of the stairs and use them. Fix handrails that are broken or loose. Make sure that handrails are as long as the stairways. Check any carpeting to make sure that it is firmly attached to the stairs. Fix any carpet that is loose or worn. Avoid having throw rugs at the top or bottom of the stairs. If you do have throw rugs, attach them to the floor with carpet tape. Make sure that you have a light switch at the top of the stairs and the bottom of the stairs. If you do not have them, ask someone to add them for you. What else can I do to help prevent falls? Wear shoes that: Do not have high heels. Have rubber bottoms. Are comfortable and fit you well. Are closed at the toe. Do not wear sandals. If you use a stepladder: Make sure that it is fully opened. Do not climb a closed stepladder. Make sure that both sides of the stepladder are locked into place. Ask someone to hold it for you, if possible. Clearly mark and make sure that you can see: Any grab bars or handrails. First and last steps. Where the edge of each step is. Use tools that help you move around (mobility aids) if they are needed. These include: Canes. Walkers. Scooters. Crutches. Turn on the lights when you go into a dark area. Replace any light bulbs as soon as they burn out. Set up your furniture so you have a clear path. Avoid moving your furniture around. If any of your floors are uneven, fix them. If there are any pets around you, be aware of where they are. Review your medicines with your doctor. Some medicines can make you feel dizzy. This can increase your chance of falling. Ask your doctor what other things that you can do to help prevent falls. This information is not intended to replace advice given to you by your health care provider. Make sure you discuss any questions you have with your  health care provider. Document Released: 01/07/2009 Document Revised: 08/19/2015 Document Reviewed: 04/17/2014 Elsevier Interactive Patient Education  2017 [employer].

## 2022-09-14 NOTE — Progress Notes (Signed)
 Subjective:   KOBI PLY is a 88 y.o. male who presents for Medicare Annual/Subsequent preventive examination.  Visit Complete: Virtual  I connected with  Elie Confer on 09/14/22 by a audio enabled telemedicine application and verified that I am speaking with the correct person using two identifiers.  Patient Location: Home  Provider Location: Home Office  I discussed the limitations of evaluation and management by telemedicine. The patient expressed understanding and agreed to proceed.  Patient Medicare AWV questionnaire was completed by the patient on n/a; I have confirmed that all information answered by patient is correct and no changes since this date.  Review of Systems     Cardiac Risk Factors include: advanced age (>47men, >70 women);male gender     Objective:    Today's Vitals   09/14/22 1433 09/14/22 1434  BP: 128/84   Weight: 197 lb (89.4 kg)   Height: 5\' 10"  (1.778 m)   PainSc:  0-No pain   Body mass index is 28.27 kg/m.     09/14/2022    2:43 PM 08/28/2022    4:46 PM 08/28/2022    2:00 PM 08/27/2022    7:25 PM 07/16/2022   10:58 AM 07/14/2022    8:55 PM 07/14/2022    2:25 PM  Advanced Directives  Does Patient Have a Medical Advance Directive? No No No No No No No  Copy of Healthcare Power of Attorney in Chart?     No - copy requested    Would patient like information on creating a medical advance directive? No - Patient declined No - Patient declined No - Patient declined  No - Patient declined No - Patient declined No - Patient declined    Current Medications (verified) Outpatient Encounter Medications as of 09/14/2022  Medication Sig   allopurinol (ZYLOPRIM) 300 MG tablet Take 1 tablet (300 mg total) by mouth daily. (Patient not taking: Reported on 09/14/2022)   ibuprofen (ADVIL) 600 MG tablet Take 1 tablet (600 mg total) by mouth every 6 (six) hours as needed. (Patient not taking: Reported on 09/14/2022)   Iron-FA-B Cmp-C-Biot-Probiotic (FUSION  PLUS) CAPS Take 1 capsule by mouth every other day. (Patient not taking: Reported on 09/14/2022)   Potassium 99 MG TABS Take 1 tablet by mouth daily as needed (leg cramps). (Patient not taking: Reported on 09/14/2022)   No facility-administered encounter medications on file as of 09/14/2022.    Allergies (verified) Indocin [indomethacin], Iodinated contrast media, and Penicillins   History: Past Medical History:  Diagnosis Date   Acute lower GI bleeding 07/14/2022   Adenocarcinoma of prostate (HCC) 05/08/2012   a.) Gleason = 3+3 = 6; PSA = 8.26; volume = 57cc; b.) s/p brachytherapy   Adenocarcinoma of transverse colon (HCC) 08/24/2021   a.) Bx (+) for moderately differentiated adenocarcinoma   Aortic atherosclerosis (HCC)    Aural vertigo    AVM (arteriovenous malformation) of duodenum, acquired    Basal cell carcinoma 12/31/2006   L mid infraclavicular. Excised 02/27/2007, margins free.   Basal cell carcinoma 10/30/2016    R post shoulder. Nodular pattern.   Basal cell carcinoma 03/31/2020   L lat pretibial - ED&C    Coronary artery disease involving native coronary artery of native heart with other form of angina pectoris (HCC) 09/2003   a.) followed by [facility] Gwen Pounds); transitioned to [facility] Herbie [place]) 06/2021; b.) NSTEMI 09/2003 (Sx= heaviness in chest ~100lb brick, no SOB); LHC --> 95% oLAD with diffuse 50% stenosis -> referred to CVTS;  c.) s/p CABG x 1 (LIMA-LAD) 10/19/2003; d.) Non-Ischemic Cardiolite 07/2016   Daily headache 09/22/2019   Diastolic dysfunction    a.) TTE 08/04/2014: EF 45%, mild global HK, mild LVH, mild MAC, mild TR/PR; b.) TTE 08/10/2016: EF 50%, mild LVH, mild MAC, mild MR/TR/PR; c.) TTE 07/29/2021: ED 55-60%, G1DD, Ao sclerosis with no stenosis.   Diverticulitis    DOE (dyspnea on exertion)    DOE (dyspnea on exertion) 07/12/2021   ED (erectile dysfunction)    Frequent headaches    GI bleed 11/20/2018   Goals of care, counseling/discussion  08/31/2021   Gout, arthritis    Hemorrhoids    History of heart attack 09/2003   History of lower GI bleeding 03/02/2015   History of prostate cancer 05/08/2012   Adenocarcinoma,gleason=3+3=6,PSA=8.26,vol=57cc  75 seeds treatment   HTN (hypertension)    Hyperlipidemia    Hypotension due to hypovolemia    IDA (iron deficiency anemia)    Lower GI bleed 07/15/2022   Myalgia due to statin    NSTEMI (non-ST elevated myocardial infarction) (HCC) 10/17/2003   a.) LHC 10/18/2003 --> 95% oLAD with multiple 50% sequential lesions --> referred to CVTS. b.) CABG x 1 (LIMA-LAD) 10/19/2003.   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 surgery.    BILATERAL CARPAL TUNNEL RELEASE Right 06/28/2001   Right (endoscopic)   CARPECTOMY Right    wrist   CATARACT EXTRACTION W/ INTRAOCULAR LENS  IMPLANT, BILATERAL     COLON SURGERY     COLONOSCOPY WITH PROPOFOL N/A 10/31/2016   Procedure: COLONOSCOPY WITH PROPOFOL;  Surgeon: Kieth Brightly, MD;  Location: [facility] ENDOSCOPY;  Service: Endoscopy;  Laterality: N/A;   COLONOSCOPY WITH PROPOFOL N/A 08/24/2021   Procedure: COLONOSCOPY WITH PROPOFOL;  Surgeon: Toney Reil, MD;  Location: [facility] ENDOSCOPY;  Service: Gastroenterology;  Laterality: N/A;   COLONOSCOPY WITH PROPOFOL N/A 07/16/2022   Procedure: COLONOSCOPY WITH  PROPOFOL;  Surgeon: Toney Reil, MD;  Location: [facility] ENDOSCOPY;  Service: Gastroenterology;  Laterality: N/A;   CORONARY ARTERY BYPASS GRAFT  10/19/2003   Procedure: CORONARY ARTERY BYPASS GRAFT; Locaation: [facility]; Surgeon:  Carlyon Prows, IV, MD   DORSAL COMPARTMENT RELEASE Left 06/25/2017   Procedure: LEFT WRIST TENDON SHEATH RELEASE;  Surgeon: Mack Hook, MD;  Location: [facility];  Service: Orthopedics;  Laterality: Left;   EMBOLIZATION N/A 11/21/2018   Procedure: EMBOLIZATION (Colonic);  Surgeon: Renford Dills, MD;  Location: [facility] INVASIVE CV LAB;  Service: Cardiovascular;  Laterality: N/A;   ESOPHAGOGASTRODUODENOSCOPY (EGD) WITH PROPOFOL N/A 08/24/2021   Procedure: ESOPHAGOGASTRODUODENOSCOPY (EGD) WITH PROPOFOL;  Surgeon: Toney Reil, MD;  Location: [facility] ENDOSCOPY;  Service: Gastroenterology;  Laterality: N/A;   FOOT SURGERY Bilateral  GOUT   KNEE ARTHROSCOPY Bilateral 1986   Right: 1986; left 09/06/2009   LAPAROSCOPIC RIGHT COLECTOMY N/A 10/11/2021   Procedure: LAPAROSCOPIC RIGHT COLECTOMY-RNFA to assist Converted to open procedure;  Surgeon: Leafy Ro, MD;  Location: [facility] ORS;  Service: General;  Laterality: N/A;  Provider is requesting 3 hours(180 minutes) for this case   LEFT ELBOW SURGERY  AGE 57  &  2007   LEFT HEART CATH AND CORONARY ANGIOGRAPHY  10/18/2003   [facility] (non-STEMI): Ostial LAD 95% with multiple sequential 50% stenoses. => Referred for CABG   NM GATED MYOCARDIAL STUDY (ARMX HX)  07/31/2016   Treadmill Myoview/Cardiolite : [facility]): 4.6 METS, 92 % MPHR.  EF 55 to 60%.  Normal wall motion.  No ischemia or infarction.   OLECRANON BURSA EXCISION Right 06/28/2001   OLECRANON BURSA EXCISION Left 06/24/2007   RADIOACTIVE SEED IMPLANT N/A 08/02/2012   Procedure: RADIOACTIVE SEED IMPLANT;  Surgeon: Garnett Farm, MD;  Location: [facility];  Service: Urology;  Laterality: N/A;   TOTAL KNEE  ARTHROPLASTY Bilateral 04/14/2004   RIGHT 2006; LEFT 11/21/2010   TRANSTHORACIC ECHOCARDIOGRAM  07/31/2016   ([facility]) normal LV function.  Mild LVH.  EF 50%.  Normal RV.  Mild MR, TR and PR.  No stenosis.   Family History  Problem Relation Age of Onset   Cerebral aneurysm Mother    Alzheimer's disease Father    Lung cancer Sister 18       treated surgically   Cancer - Lung Sister    Social History   Socioeconomic History   Marital status: Media planner    Spouse name: Alona Bene   Number of children: 1   Years of education: Not on file   Highest education level: Some college, no degree  Occupational History   Occupation: Retired  Tobacco Use   Smoking status: Former    Packs/day: 3.00    Years: 31.00    Additional pack years: 0.00    Total pack years: 93.00    Types: Cigarettes    Quit date: 08/01/1987    Years since quitting: 35.1   Smokeless tobacco: Never  Vaping Use   Vaping Use: Never used  Substance and Sexual Activity   Alcohol use: Not Currently    Comment: once a year on NYE   Drug use: No   Sexual activity: Not Currently  Other Topics Concern   Not on file  Social History Narrative   09/22/19   From: the area   Living: with partner - Clyde Canterbury (dementia) 1984   Work: retired - Marine scientist shop      Family: 1 son - but does not have a relationship      Enjoys: golf, reading      Exercise: golf once a week, yardwork   Diet: not great due to caring for wife -> also limits his activity level.  He does all of the housework including cooking, cleaning, laundry, vacuuming and yard work.      Safety   Seat belts: Yes    Guns: Yes  and secure   Safe in relationships: Yes    Social Determinants of Health   Financial Resource Strain: Low Risk  (09/14/2022)   Overall Financial Resource Strain (CARDIA)    Difficulty of Paying Living Expenses: Not hard at all  Food Insecurity: No Food Insecurity (09/14/2022)   Hunger Vital Sign     Worried About Running Out of Food in the  Last Year: Never true    Ran Out of Food in the Last Year: Never true  Transportation Needs: No Transportation Needs (09/14/2022)   PRAPARE - Administrator, Civil Service (Medical): No    Lack of Transportation (Non-Medical): No  Physical Activity: Insufficiently Active (09/14/2022)   Exercise Vital Sign    Days of Exercise per Week: 3 days    Minutes of Exercise per Session: 30 min  Stress: No Stress Concern Present (09/14/2022)   [employer] of Occupational Health - Occupational Stress Questionnaire    Feeling of Stress : Not at all  Social Connections: Moderately Isolated (09/14/2022)   Social Connection and Isolation Panel [NHANES]    Frequency of Communication with Friends and Family: More than three times a week    Frequency of Social Gatherings with Friends and Family: More than three times a week    Attends Religious Services: Never    Database administrator or Organizations: No    Attends Engineer, structural: Never    Marital Status: Married    Tobacco Counseling Counseling given: Yes   Clinical Intake:  Pre-visit preparation completed: Yes  Pain : No/denies pain Pain Score: 0-No pain     BMI - recorded: 28.27 Nutritional Status: BMI 25 -29 Overweight Nutritional Risks: None Diabetes: No  How often do you need to have someone help you when you read instructions, pamphlets, or other written materials from your doctor or pharmacy?: 1 - Never  Interpreter Needed?: No  Information entered by ::  Kani Jobson, CMA   Activities of Daily Living    09/14/2022    2:41 PM 08/28/2022    2:00 PM  In your present state of health, do you have any difficulty performing the following activities:  Hearing? 0 0  Vision? 0 0  Difficulty concentrating or making decisions? 0 0  Walking or climbing stairs? 0 0  Dressing or bathing? 0 0  Doing errands, shopping? 0 0  Preparing Food and eating ? N   Using the  Toilet? N   In the past six months, have you accidently leaked urine? N   Do you have problems with loss of bowel control? N   Managing your Medications? N   Managing your Finances? N   Housekeeping or managing your Housekeeping? N     Patient Care Team: Dana Allan, MD as PCP - General (Family Medicine) Marykay Lex, MD as PCP - Cardiology (Cardiology) Linus Salmons, MD (Otolaryngology) Deirdre Evener, MD (Dermatology) Lamar Blinks, MD as Consulting Physician (Cardiology) Galen Manila, MD (Ophthalmology)  Indicate any recent Medical Services you may have received from other than [facility] providers in the past year (date may be approximate).     Assessment:   This is a routine wellness examination for Abdishakur.  Hearing/Vision screen Hearing Screening - Comments:: Patient denies any hearing difficulties.   Vision Screening - Comments:: Patient states they wear reading glasses only.    Dietary issues and exercise activities discussed:     Goals Addressed             This Visit's Progress    Patient Stated       Goal is to stay out of the hospital. Has been hospitalized three times this year        Depression Screen    09/14/2022    2:36 PM 06/12/2022    2:35 PM 05/09/2022    1:18 PM 01/11/2022    8:23 AM  12/13/2021   11:11 AM 09/12/2021    9:52 AM 09/09/2020    9:59 AM  PHQ 2/9 Scores  PHQ - 2 Score 0 0 0 0 0 0 0  PHQ- 9 Score   0    0    Fall Risk    09/14/2022    2:42 PM 06/12/2022    2:35 PM 05/09/2022    1:18 PM 01/11/2022    8:23 AM 12/13/2021   11:11 AM  Fall Risk   Falls in the past year? 0 0 0 0 0  Number falls in past yr: 0 0 0 0 0  Injury with Fall? 0 0 0 0 0  Risk for fall due to : No Fall Risks No Fall Risks No Fall Risks No Fall Risks No Fall Risks  Follow up Falls prevention discussed Falls evaluation completed Falls evaluation completed Falls evaluation completed Falls evaluation completed    MEDICARE RISK AT HOME:   Medicare Risk at Home - 09/14/22 1442     Any stairs in or around the home? No    If so, are there any without handrails? No    Home free of loose throw rugs in walkways, pet beds, electrical cords, etc? Yes    Adequate lighting in your home to reduce risk of falls? Yes    Life alert? No    Use of a cane, walker or w/c? No    Grab bars in the bathroom? No    Shower chair or bench in shower? No    Elevated toilet seat or a handicapped toilet? No             TIMED UP AND GO:  Was the test performed?  No    Cognitive Function:    09/09/2020   10:09 AM  MMSE - Mini Mental State Exam  Not completed: Refused        09/14/2022    2:36 PM 12/11/2017    9:07 AM  6CIT Screen  What Year? 0 points 0 points  What month? 0 points 0 points  What time? 0 points 0 points  Count back from 20 0 points 0 points  Months in reverse 0 points 0 points  Repeat phrase 0 points 0 points  Total Score 0 points 0 points    Immunizations Immunization History  Administered Date(s) Administered   Fluad Quad(high Dose 65+) 12/24/2018, 12/13/2021   Influenza, High Dose Seasonal PF 02/02/2015, 01/10/2017, 01/10/2018, 01/14/2021   Influenza-Unspecified 01/02/2013, 01/08/2014, 02/02/2015   PFIZER(Purple Top)SARS-COV-2 Vaccination 04/17/2019, 05/08/2019, 12/29/2019, 07/16/2020   PNEUMOCOCCAL CONJUGATE-20 01/11/2022   Pneumococcal Conjugate-13 08/10/2010   Pneumococcal Polysaccharide-23 01/02/2003, 08/10/2014   Tdap 08/10/2014    TDAP status: Up to date  Flu Vaccine status: Up to date  Pneumococcal vaccine status: Up to date  Covid-19 vaccine status: Information provided on how to obtain vaccines.   Qualifies for Shingles Vaccine? Yes   Zostavax completed No   Shingrix Completed?: No.    Education has been provided regarding the importance of this vaccine. Patient has been advised to call insurance company to determine out of pocket expense if they have not yet received this vaccine. Advised  may also receive vaccine at local pharmacy or Health Dept. Verbalized acceptance and understanding.  Screening Tests Health Maintenance  Topic Date Due   Zoster Vaccines- Shingrix (1 of 2) Never done   COVID-19 Vaccine (5 - 2023-24 season) 11/25/2021   INFLUENZA VACCINE  10/26/2022   Medicare Annual Wellness (AWV)  09/14/2023   DTaP/Tdap/Td (2 - Td or Tdap) 08/09/2024   Pneumonia Vaccine 65+ Years old  Completed   HPV VACCINES  Aged Out    Health Maintenance  Health Maintenance Due  Topic Date Due   Zoster Vaccines- Shingrix (1 of 2) Never done   COVID-19 Vaccine (5 - 2023-24 season) 11/25/2021    Colorectal cancer screening: No longer required.   Lung Cancer Screening: (Low Dose CT Chest recommended if Age 43-80 years, 20 pack-year currently smoking OR have quit w/in 15years.) does not qualify.   Lung Cancer Screening Referral: n/a  Additional Screening:  Hepatitis C Screening: does not qualify;  Vision Screening: Recommended annual ophthalmology exams for early detection of glaucoma and other disorders of the eye. Is the patient up to date with their annual eye exam?  Yes  Who is the provider or what is the name of the office in which the patient attends annual eye exams? Dr. Chrystine Oiler If pt is not established with a provider, would they like to be referred to a provider to establish care? No .   Dental Screening: Recommended annual dental exams for proper oral hygiene  Diabetic Foot Exam: n/a  Community Resource Referral / Chronic Care Management: CRR required this visit?  No   CCM required this visit?  No     Plan:     I have personally reviewed and noted the following in the patient's chart:   Medical and social history Use of alcohol, tobacco or illicit drugs  Current medications and supplements including opioid prescriptions. Patient is not currently taking opioid prescriptions. Functional ability and status Nutritional status Physical  activity Advanced directives List of other physicians Hospitalizations, surgeries, and ER visits in previous 12 months Vitals Screenings to include cognitive, depression, and falls Referrals and appointments  In addition, I have reviewed and discussed with patient certain preventive protocols, quality metrics, and best practice recommendations. A written personalized care plan for preventive services as well as general preventive health recommendations were provided to patient.    Because this visit was a virtual/telehealth visit,  certain criteria was not obtained, such a blood pressure, CBG if patient is a diabetic, and timed up and go.   Jordan Hawks Chayton Murata, CMA   09/14/2022   After Visit Summary: (Mail) Due to this being a telephonic visit, the after visit summary with patients personalized plan was offered to patient via mail   Nurse Notes: health maintenance up to date

## 2022-09-18 NOTE — Anesthesia Postprocedure Evaluation (Signed)
Anesthesia Post Note  Patient: Phillip Nelson  Procedure(s) Performed: XI ROBOTIC ASSISTED LAPAROSCOPIC CHOLECYSTECTOMY (Abdomen) INDOCYANINE GREEN FLUORESCENCE IMAGING (ICG)  Patient location during evaluation: PACU Anesthesia Type: General Level of consciousness: awake and alert Pain management: pain level controlled Vital Signs Assessment: post-procedure vital signs reviewed and stable Respiratory status: spontaneous breathing, nonlabored ventilation, respiratory function stable and patient connected to nasal cannula oxygen Cardiovascular status: blood pressure returned to baseline and stable Postop Assessment: no apparent nausea or vomiting Anesthetic complications: no   No notable events documented.   Last Vitals:  Vitals:   08/29/22 0443 08/29/22 0833  BP: 125/62 (!) 111/53  Pulse: 86 84  Resp: 18 18  Temp: 37.1 C 36.5 C  SpO2: 99% 94%    Last Pain:  Vitals:   08/29/22 0749  TempSrc:   PainSc: 0-No pain                 Lenard Simmer

## 2022-10-17 ENCOUNTER — Telehealth: Payer: Self-pay

## 2022-10-17 NOTE — Telephone Encounter (Signed)
-----   Message from [facility] sent at 07/18/2022  9:22 AM EDT ----- Recheck CBC, iron panel, B12 levels in 3 months unless he develops rectal bleeding again, then he should call our office

## 2022-10-17 NOTE — Telephone Encounter (Signed)
Patient states he will come on Wednesday to have lab work

## 2022-10-24 DIAGNOSIS — D509 Iron deficiency anemia, unspecified: Secondary | ICD-10-CM | POA: Diagnosis not present

## 2022-10-24 DIAGNOSIS — E538 Deficiency of other specified B group vitamins: Secondary | ICD-10-CM | POA: Diagnosis not present

## 2022-10-25 ENCOUNTER — Telehealth: Payer: Self-pay

## 2022-10-25 NOTE — Telephone Encounter (Signed)
Patient verbalized understanding results  

## 2022-10-25 NOTE — Telephone Encounter (Signed)
-----   Message from [facility] sent at 10/25/2022 12:11 PM EDT ----- His hemoglobin is normal along with B12 and iron levels.  He can stop taking iron supplements at this time  RV

## 2022-11-09 ENCOUNTER — Encounter (INDEPENDENT_AMBULATORY_CARE_PROVIDER_SITE_OTHER): Payer: Self-pay

## 2022-12-13 ENCOUNTER — Encounter: Payer: Self-pay | Admitting: Family Medicine

## 2022-12-13 ENCOUNTER — Ambulatory Visit: Payer: Medicare Other | Admitting: Family Medicine

## 2022-12-13 VITALS — BP 122/76 | HR 87 | Temp 97.9°F | Resp 16 | Ht 69.5 in | Wt 210.4 lb

## 2022-12-13 DIAGNOSIS — Z636 Dependent relative needing care at home: Secondary | ICD-10-CM | POA: Diagnosis not present

## 2022-12-13 DIAGNOSIS — Z23 Encounter for immunization: Secondary | ICD-10-CM

## 2022-12-13 DIAGNOSIS — Z87898 Personal history of other specified conditions: Secondary | ICD-10-CM

## 2022-12-13 DIAGNOSIS — M1A9XX0 Chronic gout, unspecified, without tophus (tophi): Secondary | ICD-10-CM

## 2022-12-13 MED ORDER — ALLOPURINOL 300 MG PO TABS: 300.0000 mg | ORAL_TABLET | Freq: Every day | ORAL | 3 refills | Status: DC

## 2022-12-13 MED ORDER — LOPERAMIDE HCL 2 MG PO CAPS: 2.0000 mg | ORAL_CAPSULE | ORAL | 1 refills | Status: DC | PRN

## 2022-12-13 NOTE — Patient Instructions (Addendum)
It was a pleasure meeting you today. Thank you for allowing me to take part in your health care.  Our goals for today as we discussed include:  Refills sent for requested medications  Follow up with eye exam  Received Flu vaccine today  Follow up in 3 months   If you have any questions or concerns, please do not hesitate to call the office at [phone].  I look forward to our next visit and until then take care and stay safe.  Regards,   Dana Allan, MD   [facility]

## 2022-12-13 NOTE — Progress Notes (Signed)
SUBJECTIVE:   Chief Complaint  Patient presents with   Medical Management of Chronic Issues   HPI Patient presents to clinic for management of chronic disease management  No acute concerns.  Requesting refill for Loperamide  Hyperlipidemia Self discontinued Crestor.  Was previously prescribed 10 mg q. weekly.  No history of myalgias.  Agreeable to restarting medication.  Caregiver stress Cares for significant other with Dementia.  Does have some time to self when partners sons take her for few hours on Sundays.  Also plays golf but has decreased time.  Endorses having good friend to talk to so not interested in therapy or medication.  Gout Requesting refill of Allopurinol.  No recent flare up .   PERTINENT PMH / PSH: CAD Prediabetes History of anemia   OBJECTIVE:  BP 122/76   Pulse 87   Temp 97.9 F (36.6 C)   Resp 16   Ht 5' 9.5" (1.765 m)   Wt 210 lb 6 oz (95.4 kg)   SpO2 95%   BMI 30.62 kg/m    Physical Exam Vitals reviewed.  Constitutional:      General: He is not in acute distress.    Appearance: Normal appearance. He is obese. He is not ill-appearing, toxic-appearing or diaphoretic.  HENT:     Right Ear: Tympanic membrane normal.     Left Ear: Tympanic membrane normal.     Mouth/Throat:     Mouth: Mucous membranes are moist.  Eyes:     General:        Right eye: No discharge.        Left eye: No discharge.     Conjunctiva/sclera: Conjunctivae normal.  Neck:     Thyroid: No thyromegaly or thyroid tenderness.  Cardiovascular:     Rate and Rhythm: Normal rate and regular rhythm.     Heart sounds: Normal heart sounds.  Pulmonary:     Effort: Pulmonary effort is normal.     Breath sounds: Normal breath sounds.  Abdominal:     General: Bowel sounds are normal.  Musculoskeletal:        General: Normal range of motion.     Cervical back: Normal range of motion.  Skin:    General: Skin is warm and dry.  Neurological:     Mental Status: He is  alert and oriented to person, place, and time. Mental status is at baseline.  Psychiatric:        Mood and Affect: Mood normal.        Behavior: Behavior normal.        Thought Content: Thought content normal.        Judgment: Judgment normal.     ASSESSMENT/PLAN:  Need for influenza vaccination -     Flu Vaccine Trivalent High Dose (Fluad)  Chronic gout without tophus, unspecified cause, unspecified site -     Allopurinol; Take 1 tablet (300 mg total) by mouth daily.  Dispense: 90 tablet; Refill: 3  History of diarrhea Assessment & Plan: Intermittent but likes to have medication on hand. Refill loperamide 2 mg, max dose 8 mg daily     Caregiver stress Assessment & Plan: Coping well. Cares for partner with Dementia Has good social support Follow up as needed   Other orders -     Loperamide HCl; Take 1 capsule (2 mg total) by mouth as needed for diarrhea or loose stools.  Dispense: 30 capsule; Refill: 1     PDMP reviewed  Return in about 3  months (around 03/14/2023) for PCP.  Dana Allan, MD

## 2022-12-19 ENCOUNTER — Encounter: Payer: Self-pay | Admitting: Family Medicine

## 2022-12-19 DIAGNOSIS — Z636 Dependent relative needing care at home: Secondary | ICD-10-CM | POA: Insufficient documentation

## 2022-12-19 NOTE — Assessment & Plan Note (Signed)
Coping well. Cares for partner with Dementia Has good social support Follow up as needed

## 2022-12-19 NOTE — Assessment & Plan Note (Signed)
Intermittent but likes to have medication on hand. Refill loperamide 2 mg, max dose 8 mg daily

## 2022-12-19 NOTE — Assessment & Plan Note (Signed)
Chronic.  Asymptomatic. Refill allopurinol

## 2022-12-21 ENCOUNTER — Ambulatory Visit: Payer: Medicare Other | Admitting: Nurse Practitioner

## 2023-01-01 ENCOUNTER — Ambulatory Visit: Payer: Medicare Other

## 2023-01-01 DIAGNOSIS — Z719 Counseling, unspecified: Secondary | ICD-10-CM

## 2023-01-01 DIAGNOSIS — Z23 Encounter for immunization: Secondary | ICD-10-CM | POA: Diagnosis not present

## 2023-01-01 NOTE — Progress Notes (Signed)
In nurse clinic requesting Covid vaccine. Covid Pfizer Comirnaty [phone] (12 yrs+) given and tolerated well. Updated [facility] copy given and explained. Unable to stay for 15 min observation. Jerel Shepherd, RN

## 2023-01-03 ENCOUNTER — Other Ambulatory Visit: Payer: Self-pay

## 2023-01-03 ENCOUNTER — Emergency Department: Payer: Medicare Other

## 2023-01-03 ENCOUNTER — Emergency Department
Admission: EM | Admit: 2023-01-03 | Discharge: 2023-01-03 | Disposition: A | Discharge status: Home / Self Care | Payer: Medicare Other | Attending: Emergency Medicine | Admitting: Emergency Medicine

## 2023-01-03 DIAGNOSIS — Z85038 Personal history of other malignant neoplasm of large intestine: Secondary | ICD-10-CM | POA: Diagnosis not present

## 2023-01-03 DIAGNOSIS — R531 Weakness: Secondary | ICD-10-CM | POA: Insufficient documentation

## 2023-01-03 DIAGNOSIS — U071 COVID-19: Secondary | ICD-10-CM | POA: Insufficient documentation

## 2023-01-03 DIAGNOSIS — Z0389 Encounter for observation for other suspected diseases and conditions ruled out: Secondary | ICD-10-CM | POA: Diagnosis not present

## 2023-01-03 DIAGNOSIS — R5383 Other fatigue: Secondary | ICD-10-CM | POA: Diagnosis present

## 2023-01-03 DIAGNOSIS — R791 Abnormal coagulation profile: Secondary | ICD-10-CM | POA: Insufficient documentation

## 2023-01-03 DIAGNOSIS — R059 Cough, unspecified: Secondary | ICD-10-CM | POA: Diagnosis not present

## 2023-01-03 LAB — COMPREHENSIVE METABOLIC PANEL
ALT: 29 U/L (ref 0–44)
AST: 38 U/L (ref 15–41)
Albumin: 3.7 g/dL (ref 3.5–5.0)
Alkaline Phosphatase: 59 U/L (ref 38–126)
Anion gap: 13 (ref 5–15)
BUN: 14 mg/dL (ref 8–23)
CO2: 19 mmol/L — ABNORMAL LOW (ref 22–32)
Calcium: 8.3 mg/dL — ABNORMAL LOW (ref 8.9–10.3)
Chloride: 101 mmol/L (ref 98–111)
Creatinine, Ser: 0.91 mg/dL (ref 0.61–1.24)
GFR, Estimated: >60 mL/min (ref >60)
Glucose, Bld: 89 mg/dL (ref 70–99)
Potassium: 3.8 mmol/L (ref 3.5–5.1)
Sodium: 133 mmol/L — ABNORMAL LOW (ref 135–145)
Total Bilirubin: 1.4 mg/dL — ABNORMAL HIGH (ref 0.3–1.2)
Total Protein: 7.3 g/dL (ref 6.5–8.1)

## 2023-01-03 LAB — APTT: aPTT: 29 s (ref 24–36)

## 2023-01-03 LAB — CBC WITH DIFFERENTIAL/PLATELET
Abs Immature Granulocytes: 0.04 10*3/uL (ref 0.00–0.07)
Basophils Absolute: 0 10*3/uL (ref 0.0–0.1)
Basophils Relative: 0 %
Eosinophils Absolute: 0 10*3/uL (ref 0.0–0.5)
Eosinophils Relative: 0 %
HCT: 40.2 % (ref 39.0–52.0)
Hemoglobin: 13.2 g/dL (ref 13.0–17.0)
Immature Granulocytes: 1 %
Lymphocytes Relative: 16 %
Lymphs Abs: 1.2 10*3/uL (ref 0.7–4.0)
MCH: 32.6 pg (ref 26.0–34.0)
MCHC: 32.8 g/dL (ref 30.0–36.0)
MCV: 99.3 fL (ref 80.0–100.0)
Monocytes Absolute: 1 10*3/uL (ref 0.1–1.0)
Monocytes Relative: 13 %
Neutro Abs: 5.3 10*3/uL (ref 1.7–7.7)
Neutrophils Relative %: 70 %
Platelets: 256 10*3/uL (ref 150–400)
RBC: 4.05 MIL/uL — ABNORMAL LOW (ref 4.22–5.81)
RDW: 14.3 % (ref 11.5–15.5)
WBC: 7.6 10*3/uL (ref 4.0–10.5)
nRBC: 0 % (ref 0.0–0.2)

## 2023-01-03 LAB — RESP PANEL BY RT-PCR (RSV, FLU A&B, COVID)  RVPGX2
Influenza A by PCR: NEGATIVE
Influenza B by PCR: NEGATIVE
Resp Syncytial Virus by PCR: NEGATIVE
SARS Coronavirus 2 by RT PCR: POSITIVE — AB

## 2023-01-03 LAB — URINALYSIS, W/ REFLEX TO CULTURE (INFECTION SUSPECTED)
Bilirubin Urine: NEGATIVE
Glucose, UA: NEGATIVE mg/dL
Hgb urine dipstick: NEGATIVE
Ketones, ur: 20 mg/dL — AB
Leukocytes,Ua: NEGATIVE
Nitrite: NEGATIVE
Protein, ur: NEGATIVE mg/dL
Specific Gravity, Urine: 1.013 (ref 1.005–1.030)
Squamous Epithelial / HPF: 0 /[HPF] (ref 0–5)
pH: 5 (ref 5.0–8.0)

## 2023-01-03 LAB — PROTIME-INR
INR: 1.1 (ref 0.8–1.2)
Prothrombin Time: 14.5 s (ref 11.4–15.2)

## 2023-01-03 LAB — LACTIC ACID, PLASMA: Lactic Acid, Venous: 1.2 mmol/L (ref 0.5–1.9)

## 2023-01-03 LAB — TROPONIN I (HIGH SENSITIVITY)
Troponin I (High Sensitivity): 12 ng/L (ref <18)
Troponin I (High Sensitivity): 13 ng/L (ref <18)

## 2023-01-03 MED ORDER — ACETAMINOPHEN 500 MG PO TABS
1000.0000 mg | ORAL_TABLET | Freq: Once | ORAL | Status: AC
  Administered 2023-01-03: 1000 mg via ORAL
  Filled 2023-01-03: qty 2

## 2023-01-03 MED ORDER — SODIUM CHLORIDE 0.9 % IV BOLUS
500.0000 mL | Freq: Once | INTRAVENOUS | Status: AC
  Administered 2023-01-03: 500 mL via INTRAVENOUS

## 2023-01-03 MED ORDER — NIRMATRELVIR/RITONAVIR (PAXLOVID)TABLET: 3.0000 | ORAL_TABLET | Freq: Two times a day (BID) | ORAL | 0 refills | Status: AC

## 2023-01-03 NOTE — ED Triage Notes (Signed)
Pt states he got a covid vaccine on Monday and after that has had a nonproductive cough, fatigue, and weakness. Pt has been afebrile. Cx pt thast has been in remission for 1 year.    Past Medical History:  Diagnosis Date   Acute lower GI bleeding 07/14/2022   Adenocarcinoma of prostate (HCC) 05/08/2012   a.) Gleason = 3+3 = 6; PSA = 8.26; volume = 57cc; b.) s/p brachytherapy   Adenocarcinoma of transverse colon (HCC) 08/24/2021   a.) Bx (+) for moderately differentiated adenocarcinoma   Aortic atherosclerosis (HCC)    Aural vertigo    AVM (arteriovenous malformation) of duodenum, acquired    Basal cell carcinoma 12/31/2006   L mid infraclavicular. Excised 02/27/2007, margins free.   Basal cell carcinoma 10/30/2016    R post shoulder. Nodular pattern.   Basal cell carcinoma 03/31/2020   L lat pretibial - ED&C    Coronary artery disease involving native coronary artery of native heart with other form of angina pectoris (HCC) 09/2003   a.) followed by [facility] Gwen Pounds); transitioned to [facility] Herbie [place]) 06/2021; b.) NSTEMI 09/2003 (Sx= heaviness in chest ~100lb brick, no SOB); LHC --> 95% oLAD with diffuse 50% stenosis -> referred to CVTS; c.) s/p CABG x 1 (LIMA-LAD) 10/19/2003; d.) Non-Ischemic Cardiolite 07/2016   Daily headache 09/22/2019   Diastolic dysfunction    a.) TTE 08/04/2014: EF 45%, mild global HK, mild LVH, mild MAC, mild TR/PR; b.) TTE 08/10/2016: EF 50%, mild LVH, mild MAC, mild MR/TR/PR; c.) TTE 07/29/2021: ED 55-60%, G1DD, Ao sclerosis with no stenosis.   Diverticulitis    DOE (dyspnea on exertion)    DOE (dyspnea on exertion) 07/12/2021   ED (erectile dysfunction)    Frequent headaches    GI bleed 11/20/2018   Goals of care, counseling/discussion 08/31/2021   Gout, arthritis    Hemorrhoids    History of heart attack 09/2003   History of lower GI bleeding 03/02/2015   History of prostate cancer 05/08/2012   Adenocarcinoma,gleason=3+3=6,PSA=8.26,vol=57cc   75 seeds treatment   HTN (hypertension)    Hyperlipidemia    Hypotension due to hypovolemia    IDA (iron deficiency anemia)    Lower GI bleed 07/15/2022   Myalgia due to statin    NSTEMI (non-ST elevated myocardial infarction) (HCC) 10/17/2003   a.) LHC 10/18/2003 --> 95% oLAD with multiple 50% sequential lesions --> referred to CVTS. b.) CABG x 1 (LIMA-LAD) 10/19/2003.   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

## 2023-01-03 NOTE — ED Notes (Signed)
Pt was able to ambulate in his room safely and able to tolerate moving on his own. His vitals remained stable at the time of ambulation.

## 2023-01-03 NOTE — ED Notes (Signed)
Pt aware we need urine sample. Pt has urinal at bedside.

## 2023-01-03 NOTE — ED Notes (Signed)
Pt c/o extreme fatigue and has been having trouble getting around at home.

## 2023-01-03 NOTE — Discharge Instructions (Signed)
You can take Tylenol 1 g every 8 hours, ibuprofen 600 every 8 hours with food and take the Paxlovid for your COVID-19.  Please stay quarantined and return to the ER for shortness of breath, worsening weakness or any other concerns

## 2023-01-03 NOTE — ED Notes (Signed)
ED Provider at bedside. 

## 2023-01-03 NOTE — ED Provider Notes (Signed)
Patient ambulatory without difficult and feels much better. He is satting well on RA. He has been given IVF. He would like to attempt management at home as he cares for his wife. He is aware of return precautions. Will d/c on Paxlovid. I advised him to call his wife's PCP to discuss and see if she would be a candidate for prophylactic treatment.   Shaune Pollack, MD 01/03/23 [phone]

## 2023-01-03 NOTE — ED Provider Notes (Signed)
[facility] Provider Note    Event Date/Time   First MD Initiated Contact with Patient 01/03/23 [phone]     (approximate)   History   Cough and Weakness   HPI  Phillip Nelson is a 88 y.o. male with prior history of colon cancer status post surgical removal who comes in with concerns for fatigue and weakness.  Patient reports having his COVID-vaccine on Monday.  He reports having 6 prior COVID vaccines previously and never having significant side effects.  However on Tuesday he started developing a nonproductive cough fatigue weakness.  He reports that the main reason that he is here is because he is so weak he was having difficulty standing up.  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  He denies any chest pain, shortness of breath.  No abdominal pain.  He denies any falls and hitting his head.  Physical Exam   Triage Vital Signs: ED Triage Vitals  Encounter Vitals Group     BP 01/03/23 0356 (!) 108/53     Systolic BP Percentile --      Diastolic BP Percentile --      Pulse Rate 01/03/23 0356 83     Resp 01/03/23 0356 16     Temp 01/03/23 0356 99.7 F (37.6 C)     Temp src --      SpO2 01/03/23 0349 96 %     Weight 01/03/23 0356 218 lb (98.9 kg)     Height --      Head Circumference --      Peak Flow --      Pain Score 01/03/23 0356 0     Pain Loc --      Pain Education --      Exclude from Growth Chart --     Most recent vital signs: Vitals:   01/03/23 0349 01/03/23 0356  BP:  (!) 108/53  Pulse:  83  Resp:  16  Temp:  99.7 F (37.6 C)  SpO2: 96% 97%     General: Awake, no distress.  CV:  Good peripheral perfusion.  Resp:  Normal effort.  Abd:  No distention.  Nontender Other:  No swelling in legs.  No calf tenderness   ED Results / Procedures / Treatments   Labs (all labs ordered are listed, but only abnormal results are displayed) Labs Reviewed  CULTURE,  BLOOD (ROUTINE X 2)  CULTURE, BLOOD (ROUTINE X 2)  RESP PANEL BY RT-PCR (RSV, FLU A&B, COVID)  RVPGX2  LACTIC ACID, PLASMA  LACTIC ACID, PLASMA  COMPREHENSIVE METABOLIC PANEL  CBC WITH DIFFERENTIAL/PLATELET  PROTIME-INR  APTT  URINALYSIS, W/ REFLEX TO CULTURE (INFECTION SUSPECTED)  TROPONIN I (HIGH SENSITIVITY)     EKG  My interpretation of EKG:  Normal sinus 77, no st elevation, no twi, RBBB and LAFB   Has had similar prior blocks previously  RADIOLOGY I have reviewed the xray personally and interpreted and no pna    PROCEDURES:  Critical Care performed: No  .1-3 Lead EKG Interpretation  Performed by: Concha Se, MD Authorized by: Concha Se, MD     Interpretation: normal     ECG rate:  90   ECG rate assessment: normal     Rhythm: sinus rhythm     Ectopy: none     Conduction: normal      MEDICATIONS ORDERED IN ED: Medications  sodium chloride  0.9 % bolus 500 mL (has no administration in time range)     IMPRESSION / MDM / ASSESSMENT AND PLAN / ED COURSE  I reviewed the triage vital signs and the nursing notes.   Patient's presentation is most consistent with acute presentation with potential threat to life or bodily function.   Patient comes in with generalized weakness in the setting of COVID-vaccine.  Will get blood work to ensure there is no concurrent bacterial infection such as UTI, pneumonia, labs to evaluate for AKI, COVID, flu.  Will give a little bit of fluids  COVID test is positive.  Lactate is normal.  CMP is reassuring with slight elevation of total bilirubin.  CBC is normal.  Given his positive COVID test I suspect this is most likely viral in nature.  Will give some Tylenol we have rechecked a temperature 3 hours after being here and he remains afebrile does not meet sepsis criteria prior to tylenol being given.  He is given some fluids.  Patient will be handed off to oncoming team pending UA, ambulating sat to see if patient can  manage his outpatient on Paxlovid.  Discussed with patient and he would like to use this medication if he is able to go home  The patient is on the cardiac monitor to evaluate for evidence of arrhythmia and/or significant heart rate changes.      FINAL CLINICAL IMPRESSION(S) / ED DIAGNOSES   Final diagnoses:  COVID-19  Weakness     Rx / DC Orders   ED Discharge Orders          Ordered    nirmatrelvir/ritonavir (PAXLOVID) 20 x 150 MG & 10 x 100MG  TABS  2 times daily        01/03/23 0656             Note:  This document was prepared using Dragon voice recognition software and may include unintentional dictation errors.   Concha Se, MD 01/03/23 [phone]

## 2023-01-08 LAB — CULTURE, BLOOD (ROUTINE X 2)
Culture: NO GROWTH
Culture: NO GROWTH
Special Requests: ADEQUATE

## 2023-01-15 DIAGNOSIS — J069 Acute upper respiratory infection, unspecified: Secondary | ICD-10-CM | POA: Diagnosis not present

## 2023-02-06 ENCOUNTER — Telehealth: Payer: Self-pay

## 2023-02-06 NOTE — Telephone Encounter (Signed)
Transition Care Management Unsuccessful Follow-up Telephone Call  Date of discharge and from where:  [facility] 10/9  Attempts:  1st Attempt  Reason for unsuccessful TCM follow-up call:  No answer/busy   [facility]  [facility] [employer] Guide, Phone: [phone] Website: [email or web address]

## 2023-03-14 ENCOUNTER — Ambulatory Visit: Payer: Medicare Other | Admitting: Family Medicine

## 2023-03-14 ENCOUNTER — Encounter: Payer: Self-pay | Admitting: Family Medicine

## 2023-03-14 VITALS — BP 130/74 | HR 80 | Temp 97.9°F | Resp 21 | Ht 69.5 in | Wt 210.6 lb

## 2023-03-14 DIAGNOSIS — M1A9XX0 Chronic gout, unspecified, without tophus (tophi): Secondary | ICD-10-CM

## 2023-03-14 DIAGNOSIS — E871 Hypo-osmolality and hyponatremia: Secondary | ICD-10-CM | POA: Diagnosis not present

## 2023-03-14 DIAGNOSIS — R151 Fecal smearing: Secondary | ICD-10-CM | POA: Diagnosis not present

## 2023-03-14 MED ORDER — LOPERAMIDE HCL 2 MG PO CAPS: 2.0000 mg | ORAL_CAPSULE | ORAL | 2 refills | Status: DC | PRN

## 2023-03-14 NOTE — Patient Instructions (Signed)
It was a pleasure meeting you today. Thank you for allowing me to take part in your health care.  Our goals for today as we discussed include:  Refills sent for requested medication  Schedule lab appointment in 1 month  Follow up in 6 months    This is a list of the screening recommended for you and due dates:  Health Maintenance  Topic Date Due   Zoster (Shingles) Vaccine (1 of 2) Never done   COVID-19 Vaccine (6 - 2024-25 season) 02/26/2023   Medicare Annual Wellness Visit  09/14/2023   DTaP/Tdap/Td vaccine (2 - Td or Tdap) 08/09/2024   Pneumonia Vaccine  Completed   Flu Shot  Completed   HPV Vaccine  Aged Out    If you have any questions or concerns, please do not hesitate to call the office at [phone].  I look forward to our next visit and until then take care and stay safe.  Regards,   Dana Allan, MD   [facility]

## 2023-03-14 NOTE — Progress Notes (Signed)
SUBJECTIVE:   Chief Complaint  Patient presents with   Medical Management of Chronic Issues   HPI Presents for follow up chronic disease management  Discussed the use of AI scribe software for clinical note transcription with the patient, who gave verbal consent to proceed.  History of Present Illness The patient, with a history of colon and prostate cancer, presents for a follow-up consultation. The patient reports occasional diarrhea, which is managed with Imodium, taken as needed, particularly before leaving the house. The patient notes that the frequency of diarrhea has decreased with dietary modifications, including increased vegetable intake and reduced meat consumption. The patient also reports arthritic hands, which make it difficult to access the Imodium packaging.  The patient is on allopurinol for gout, with no recent flare-ups reported. The patient also takes Advil occasionally for headaches. The patient has not been taking Fusion Plus with iron recently. The patient also reports taking potassium supplements infrequently, only when experiencing cramps.  The patient has a history of colon cancer, for which 18 inches of the colon was surgically removed. The patient also has a history of prostate cancer. The patient has not had any recent follow-ups with the gastroenterologist or the surgeon. The patient acknowledges the potential for recurrent cancer but reports no current symptoms suggestive of recurrence.  The patient also reports a recent incident of a missing gold piece, which led to the installation of a lock on a room in the house. The patient lives with a significant other, who the patient suspects may have been involved in the incident. The patient reports no significant issues with blood pressure and does not monitor it at home. The patient's lifestyle includes maintaining older vehicles and avoiding unnecessary expenditures. The patient does not express any financial  concerns.    PERTINENT PMH / PSH: As above  OBJECTIVE:  BP 130/74   Pulse 80   Temp 97.9 F (36.6 C) (Oral)   Resp (!) 21   Ht 5' 9.5" (1.765 m)   Wt 210 lb 9.6 oz (95.5 kg)   SpO2 98%   BMI 30.65 kg/m    Physical Exam Vitals reviewed.  Constitutional:      General: He is not in acute distress.    Appearance: Normal appearance. He is obese. He is not ill-appearing, toxic-appearing or diaphoretic.  Eyes:     General:        Right eye: No discharge.        Left eye: No discharge.  Cardiovascular:     Rate and Rhythm: Normal rate and regular rhythm.     Heart sounds: Normal heart sounds.  Pulmonary:     Effort: Pulmonary effort is normal.     Breath sounds: Normal breath sounds.  Abdominal:     General: Bowel sounds are normal.  Musculoskeletal:        General: Normal range of motion.     Cervical back: Normal range of motion.  Skin:    General: Skin is warm and dry.  Neurological:     Mental Status: He is alert and oriented to person, place, and time. Mental status is at baseline.  Psychiatric:        Mood and Affect: Mood normal.        Behavior: Behavior normal.        Thought Content: Thought content normal.        Judgment: Judgment normal.        03/20/2023    1:05 PM 12/13/2022  1:15 PM 09/14/2022    2:36 PM 06/12/2022    2:35 PM 05/09/2022    1:18 PM  Depression screen PHQ 2/9  Decreased Interest 0 0 0 0 0  Down, Depressed, Hopeless 0 0 0 0 0  PHQ - 2 Score 0 0 0 0 0  Altered sleeping 0 0   0  Tired, decreased energy 0 0   0  Change in appetite 0 0   0  Feeling bad or failure about yourself  0 0   0  Trouble concentrating 0 0   0  Moving slowly or fidgety/restless 0 0   0  Suicidal thoughts 0 0   0  PHQ-9 Score 0 0   0  Difficult doing work/chores Not difficult at all Not difficult at all  Not difficult at all Not difficult at all      03/20/2023    1:05 PM 12/13/2022    1:15 PM 06/12/2022    2:37 PM 05/09/2022    1:18 PM  GAD 7 :  Generalized Anxiety Score  Nervous, Anxious, on Edge 0 0 0 0  Control/stop worrying 0 0 0 0  Worry too much - different things 0 0 0 0  Trouble relaxing 0 0 0 0  Restless 0 0 0 0  Easily annoyed or irritable 0 0 0 0  Afraid - awful might happen 0 0 0 0  Total GAD 7 Score 0 0 0 0  Anxiety Difficulty Not difficult at all Not difficult at all Not difficult at all Not difficult at all    ASSESSMENT/PLAN:  Fecal smearing Assessment & Plan: Occasional episodes, particularly when going out. Currently using Imodium as needed, usually 2 tablets. Discussed the possibility of reducing to 1 tablet and carrying an extra for emergencies. -Continue Imodium as needed, consider reducing to 1 tablet and carrying an extra for emergencies.  Orders: -     Loperamide HCl; Take 1 capsule (2 mg total) by mouth as needed for diarrhea or loose stools.  Dispense: 90 capsule; Refill: 2  Hyponatremia Assessment & Plan: Seen in ED recently, Na 133. Normal mentation Recheck sodium level.  Orders: -     Comprehensive metabolic panel; Future  Chronic gout without tophus, unspecified cause, unspecified site Assessment & Plan: No recent flare-ups. Currently on Allopurinol 300mg  daily. -Continue Allopurinol 300mg  daily. -Check uric acid level at next blood draw.  Orders: -     Uric acid; Future    PDMP reviewed  Return in about 6 months (around 09/12/2023) for PCP.  Dana Allan, MD

## 2023-03-20 ENCOUNTER — Encounter: Payer: Self-pay | Admitting: Family Medicine

## 2023-03-20 DIAGNOSIS — R151 Fecal smearing: Secondary | ICD-10-CM | POA: Insufficient documentation

## 2023-03-20 DIAGNOSIS — E871 Hypo-osmolality and hyponatremia: Secondary | ICD-10-CM | POA: Insufficient documentation

## 2023-03-20 NOTE — Assessment & Plan Note (Addendum)
Seen in ED recently, Na 133. Normal mentation Recheck sodium level.

## 2023-03-20 NOTE — Assessment & Plan Note (Signed)
No recent flare-ups. Currently on Allopurinol 300mg  daily. -Continue Allopurinol 300mg  daily. -Check uric acid level at next blood draw.

## 2023-03-20 NOTE — Assessment & Plan Note (Deleted)
Occasional episodes, particularly when going out. Currently using Imodium as needed, usually 2 tablets. Discussed the possibility of reducing to 1 tablet and carrying an extra for emergencies. -Continue Imodium as needed, consider reducing to 1 tablet and carrying an extra for emergencies.

## 2023-03-20 NOTE — Assessment & Plan Note (Signed)
Occasional episodes, particularly when going out. Currently using Imodium as needed, usually 2 tablets. Discussed the possibility of reducing to 1 tablet and carrying an extra for emergencies. -Continue Imodium as needed, consider reducing to 1 tablet and carrying an extra for emergencies.

## 2023-04-11 ENCOUNTER — Other Ambulatory Visit: Payer: Medicare Other

## 2023-04-18 ENCOUNTER — Other Ambulatory Visit: Payer: Medicare Other

## 2023-04-26 ENCOUNTER — Ambulatory Visit: Payer: Medicare Other | Attending: Cardiology | Admitting: Cardiology

## 2023-04-26 ENCOUNTER — Encounter: Payer: Self-pay | Admitting: Cardiology

## 2023-04-26 VITALS — BP 116/80 | HR 78 | Ht 70.0 in | Wt 210.0 lb

## 2023-04-26 DIAGNOSIS — K922 Gastrointestinal hemorrhage, unspecified: Secondary | ICD-10-CM | POA: Diagnosis not present

## 2023-04-26 DIAGNOSIS — M791 Myalgia, unspecified site: Secondary | ICD-10-CM | POA: Insufficient documentation

## 2023-04-26 DIAGNOSIS — Z7189 Other specified counseling: Secondary | ICD-10-CM | POA: Diagnosis not present

## 2023-04-26 DIAGNOSIS — I6523 Occlusion and stenosis of bilateral carotid arteries: Secondary | ICD-10-CM | POA: Diagnosis not present

## 2023-04-26 DIAGNOSIS — T466X5A Adverse effect of antihyperlipidemic and antiarteriosclerotic drugs, initial encounter: Secondary | ICD-10-CM | POA: Diagnosis not present

## 2023-04-26 DIAGNOSIS — T466X5D Adverse effect of antihyperlipidemic and antiarteriosclerotic drugs, subsequent encounter: Secondary | ICD-10-CM

## 2023-04-26 DIAGNOSIS — I2510 Atherosclerotic heart disease of native coronary artery without angina pectoris: Secondary | ICD-10-CM | POA: Diagnosis not present

## 2023-04-26 DIAGNOSIS — Z636 Dependent relative needing care at home: Secondary | ICD-10-CM | POA: Diagnosis not present

## 2023-04-26 DIAGNOSIS — E785 Hyperlipidemia, unspecified: Secondary | ICD-10-CM | POA: Insufficient documentation

## 2023-04-26 NOTE — Patient Instructions (Signed)
Medication Instructions:  Your physician has recommended you make the following change in your medication:   START Aspirin 81 mg once daily    *If you need a refill on your cardiac medications before your next appointment, please call your pharmacy*   Lab Work: None  If you have labs (blood work) drawn today and your tests are completely normal, you will receive your results only by: MyChart Message (if you have MyChart) OR A paper copy in the mail If you have any lab test that is abnormal or we need to change your treatment, we will call you to review the results.   Testing/Procedures: None   Follow-Up: At [facility], you and your health needs are our priority.  As part of our continuing mission to provide you with exceptional heart care, we have created designated Provider Care Teams.  These Care Teams include your primary Cardiologist (physician) and Advanced Practice Providers (APPs -  Physician Assistants and Nurse Practitioners) who all work together to provide you with the care you need, when you need it.   Your next appointment:   1 year(s)  Provider:   You may see Bryan Lemma, MD or one of the following Advanced Practice Providers on your designated Care Team:   Nicolasa Ducking, NP Eula Listen, PA-C Cadence Fransico Michael, PA-C Charlsie Quest, NP Carlos Levering, NP  Other Instructions Discuss with your primary care provider about a DNR form.

## 2023-04-26 NOTE — Progress Notes (Signed)
Cardiology Office Note:  .   Date:  04/26/2023  ID:  Phillip Nelson, DOB 11/26/1934, MRN [id number] PCP: Dana Allan, MD  [facility] Providers Cardiologist:  Bryan Lemma, MD     Chief Complaint  Patient presents with   Follow-up    12 month follow up. Patient feels well. Medications reviewed verbally.   Coronary Artery Disease    History of single-vessel CABG.  No angina.    Patient Profile: Marland Kitchen     Phillip Nelson is an obese 89 y.o. male with a PMH notable for CAD-non-STEMI (one-vessel CABG in July 2005), HTN, HLD, OSA, bilateral carotid stenosis, Colon Cancer (Right Hemicolectomy, Laparoscopic Converted to Open-July 2023) who presents here for follow-up at the request of Dana Allan, MD.  CV History: Cardiac cath 10/19/2003: [facility] (non-STEMI): Ostial LAD 95% with multiple sequential 50% stenoses. => Referred for CABG Angina Sx was "heaviness in chest - like 100 lb block on his chest, no real SOB.  Occurred while playing Golf. NO Sx like that since   CABGx1 LIMA-LAD 10/19/2003: [facility], Carlyon Prows, IV, MD    Echocardiogram 07/31/2016: [facility]) normal LV function.  Mild LVH.  EF 50%.  Normal RV.  Mild MR, TR and PR.  No stenosis. Treadmill Myoview/Cardiolite 07/31/2016: [facility]): 4.6 METS, 92 % MPHR.  EF 55 to 60%.  Normal wall motion.  No ischemia or infarction.   TTE 07/29/2021: EF 55 to 60%.  Normal wall motion.  GR 1 DD.  Normal RV size and function.  Unable to assess PAP, normal RAP.Marland Kitchen  Normal MV.  AOV sclerosis with no stenosis.   Phillip Nelson was last seen on February 09, 2022 for his first cardiology follow-up after his hemicolectomy.Marland Kitchen  He was doing quite well.  He never did take his rosuvastatin citing muscle tightness after 1 dose.  Otherwise doing well.  Not as robust, but still walking and doing yard work.  Somewhat deconditioned.  Limited by leg and shoulder pain. Restarted Crestor 20 mg daily starting on for 10 mg  and titrating up.  ER-hospitalization with lower GI bleed 07/14/2022-colonoscopy on 07/16/2022 ER Hospitalization (08/27/2022) with Lap Chole on 08/28/2022 Went to the ER on 01/03/2023: for COVID-19  Subjective  Discussed the use of AI scribe software for clinical note transcription with the patient, who gave verbal consent to proceed.  History of Present Illness   The patient is an 89 year old male with coronary artery disease who presents for a cardiology follow-up.  He has a history of coronary artery disease and underwent bypass surgery in 2005. He is currently asymptomatic, experiencing no chest pain, pressure, or tightness. He is able to play nine holes of golf without shortness of breath or other symptoms. No heart racing, dizziness, or lightheadedness. No swelling in his legs or pain in his calves or thighs when walking.  He takes allopurinol for gout, which he has been on since 1968, and uses potassium occasionally for cramping. He has stopped taking cholesterol medication due to personal preference and reports taking a baby aspirin only occasionally. He also mentions experiencing headaches occasionally, which he attributes to a fall four to five years ago where he hit his head on concrete, but he was evaluated and did not have a concussion.  He has a history of colon surgery and gallbladder extraction, with the latter occurring in June 2023. He experienced a gastrointestinal bleed in April 2023, which was evaluated with a colonoscopy that did not  reveal any significant findings. No current issues with blood in his stools and notes that his stools have improved since discontinuing iron pills post-gallbladder surgery. He occasionally takes loperamide to manage his bowel movements. . He contracted COVID-19 in October 2024 and has since recovered without any lingering physical symptoms. He mentions feeling mentally fatigued from medical interventions and expresses a desire to avoid further invasive  procedures.  He is the primary caregiver for his wife, Alona Bene, who had a stroke four years ago, which limits his ability to engage in physical activities. He uses hand weights at home to maintain muscle strength.     Cardiovascular ROS: no chest pain or dyspnea on exertion negative for - edema, irregular heartbeat, orthopnea, palpitations, paroxysmal nocturnal dyspnea, rapid heart rate, shortness of breath, or syncope/near syncope/TIA/CVA or amaurosis fugax symptoms, claudication. Melena, hematochezia, hematuria, epistaxis -- no longer on Iron supplement.   ROS:  Review of Systems - Negative except occasional loose stools - takes loperamide; usses allopurinol for gout prophylaxis.  Only issue is not being as active as he would like b/c needing to be care-giver for his wife.     Objective    Studies Reviewed: Marland Kitchen   EKG Interpretation Date/Time:  Thursday April 26 2023 08:22:29 EST Ventricular Rate:  78 PR Interval:  246 QRS Duration:  126 QT Interval:  412 QTC Calculation: 469 R Axis:   -80  Text Interpretation: Sinus rhythm with 1st degree A-V block Left anterior fasicular block Right bundle branch block When compared with ECG of 03-Jan-2023 05:37, No significant change since last tracing Confirmed by Bryan Lemma ([id number]) on 04/26/2023 8:31:52 AM    Lab Results  Component Value Date   CHOL 171 12/16/2021   HDL 63 12/16/2021   LDLCALC 85 12/16/2021   TRIG 113 12/16/2021   CHOLHDL 2.7 12/16/2021   Risk Assessment/Calculations:            Physical Exam:   VS:  BP 116/80 (BP Location: Left Arm, Patient Position: Sitting, Cuff Size: Normal)   Pulse 78   Ht 5\' 10"  (1.778 m)   Wt 210 lb (95.3 kg)   SpO2 98%   BMI 30.13 kg/m    Wt Readings from Last 3 Encounters:  04/26/23 210 lb (95.3 kg)  03/14/23 210 lb 9.6 oz (95.5 kg)  01/03/23 218 lb (98.9 kg)    GEN: Well nourished, well groomed; in no acute distress; healthy appearing NECK: No JVD; No carotid bruits CARDIAC: RRR,  Normal S1, split S2; no murmurs, rubs, gallops RESPIRATORY:  Clear to auscultation without rales, wheezing or rhonchi ; nonlabored, good air movement. ABDOMEN: Soft, non-tender, non-distended EXTREMITIES:  No edema; No deformity    ASSESSMENT AND PLAN: .    Problem List Items Addressed This Visit       Cardiology Problems   Bilateral carotid artery stenosis (Chronic)   Coronary artery disease involving native coronary artery of native heart without angina pectoris - Primary (Chronic)   Remarkably healthy and doing well.  Most recent Myoview was in 2018 which was 13 years after single-vessel CABG with LIMA to LAD. He currently denies any current symptoms of chest pain, shortness of breath, or palpitations.  By choice, patient is not on any cardiac medications and in the past has declined statin therapy. -Recommended starting 81mg  aspirin most days of the week for prevention. -Given his advanced age, he was not interested in starting a statin or any other medications. -Normal blood pressure and no active symptoms, no  real indication to start a beta-blocker or ARB.  As part of his goals of care discussion, he simply just does not want to have too much more done.  He is tired of "people poking and prodding on him ".  His several issues last year simply worn out. -We recommended statin which he took for about a month and then stopped.  His mood interested in taking any medicines.      Relevant Orders   EKG 12-Lead (Completed)   Hyperlipidemia with target LDL less than 70 (Chronic)   Reluctance to take statin.  Blood and take any medications. At this point he just wants to live his life and what will happen will happen.  As such we will hold off trying to restart medications, and similarly will avoid checking labs.        Other   Caregiver stress (Chronic)   Counseling regarding goals of care (Chronic)   Patient expressed desire not to be resuscitated in the event of a collapse. No  official documentation in place. -Discuss with primary care physician about formalizing a Do Not Resuscitate order.  General Health Maintenance Patient is physically active, playing golf and lifting hand weights. -Encourage continued physical activity. -Check cholesterol levels if patient decides to reconsider statin therapy.       History of GI bleed (Chronic)   History of GI bleed in April 2023, no current symptoms of blood in stool. Patient is not on iron supplementation and occasionally takes loperamide. -Continue current management.      Myalgia due to statin (Chronic)     Follow-Up: Return in about 1 year (around 04/25/2024).     Signed, Marykay Lex, MD, MS Bryan Lemma, M.D., M.S. Interventional Cardiologist  [facility]  Pager # [phone] Phone # [phone] [address] [place], [place] [address]

## 2023-04-26 NOTE — Assessment & Plan Note (Signed)
Patient expressed desire not to be resuscitated in the event of a collapse. No official documentation in place. -Discuss with primary care physician about formalizing a Do Not Resuscitate order.  General Health Maintenance Patient is physically active, playing golf and lifting hand weights. -Encourage continued physical activity. -Check cholesterol levels if patient decides to reconsider statin therapy.

## 2023-04-26 NOTE — Assessment & Plan Note (Signed)
History of GI bleed in April 2023, no current symptoms of blood in stool. Patient is not on iron supplementation and occasionally takes loperamide. -Continue current management.

## 2023-04-26 NOTE — Assessment & Plan Note (Signed)
Reluctance to take statin.  Blood and take any medications. At this point he just wants to live his life and what will happen will happen.  As such we will hold off trying to restart medications, and similarly will avoid checking labs.

## 2023-04-26 NOTE — Assessment & Plan Note (Signed)
Remarkably healthy and doing well.  Most recent Myoview was in 2018 which was 13 years after single-vessel CABG with LIMA to LAD. He currently denies any current symptoms of chest pain, shortness of breath, or palpitations.  By choice, patient is not on any cardiac medications and in the past has declined statin therapy. -Recommended starting 81mg  aspirin most days of the week for prevention. -Given his advanced age, he was not interested in starting a statin or any other medications. -Normal blood pressure and no active symptoms, no real indication to start a beta-blocker or ARB.  As part of his goals of care discussion, he simply just does not want to have too much more done.  He is tired of "people poking and prodding on him ".  His several issues last year simply worn out. -We recommended statin which he took for about a month and then stopped.  His mood interested in taking any medicines.

## 2023-05-21 ENCOUNTER — Ambulatory Visit (INDEPENDENT_AMBULATORY_CARE_PROVIDER_SITE_OTHER): Payer: Medicare Other | Admitting: Podiatry

## 2023-05-21 ENCOUNTER — Ambulatory Visit: Payer: Medicare Other | Admitting: Podiatry

## 2023-05-21 ENCOUNTER — Encounter: Payer: Self-pay | Admitting: Podiatry

## 2023-05-21 DIAGNOSIS — L600 Ingrowing nail: Secondary | ICD-10-CM

## 2023-05-22 NOTE — Progress Notes (Signed)
 Subjective:  Patient ID: Phillip Nelson, male    DOB: 09-Feb-1935,  MRN: [id number] HPI Chief Complaint  Patient presents with   Toe Pain    Hallux left - medial border, thinks could be ingrown, but not sore, thinks maybe could get it clipped out   New Patient (Initial Visit)    89 y.o. male presents with the above complaint.   Presents today chief concern of painful ingrown nail to the hallux left.  This along the medial border he says.  ROS: Denies fever chills nausea vomit muscle aches and pains.  Past Medical History:  Diagnosis Date   Acute lower GI bleeding 07/14/2022   Adenocarcinoma of prostate (HCC) 05/08/2012   a.) Gleason = 3+3 = 6; PSA = 8.26; volume = 57cc; b.) s/p brachytherapy   Adenocarcinoma of transverse colon (HCC) 08/24/2021   a.) Bx (+) for moderately differentiated adenocarcinoma   Aortic atherosclerosis (HCC)    Aural vertigo    AVM (arteriovenous malformation) of duodenum, acquired    Basal cell carcinoma 12/31/2006   L mid infraclavicular. Excised 02/27/2007, margins free.   Basal cell carcinoma 10/30/2016    R post shoulder. Nodular pattern.   Basal cell carcinoma 03/31/2020   L lat pretibial - ED&C    Coronary artery disease involving native coronary artery of native heart with other form of angina pectoris (HCC) 09/2003   a.) followed by [facility] Gwen Pounds); transitioned to [facility] Herbie Baltimore) 06/2021; b.) NSTEMI 09/2003 (Sx= heaviness in chest ~100lb brick, no SOB); LHC --> 95% oLAD with diffuse 50% stenosis -> referred to CVTS; c.) s/p CABG x 1 (LIMA-LAD) 10/19/2003; d.) Non-Ischemic Cardiolite 07/2016   Daily headache 09/22/2019   Diastolic dysfunction    a.) TTE 08/04/2014: EF 45%, mild global HK, mild LVH, mild MAC, mild TR/PR; b.) TTE 08/10/2016: EF 50%, mild LVH, mild MAC, mild MR/TR/PR; c.) TTE 07/29/2021: ED 55-60%, G1DD, Ao sclerosis with no stenosis.   Diverticulitis    DOE (dyspnea on exertion)    DOE (dyspnea on exertion)  07/12/2021   ED (erectile dysfunction)    Frequent headaches    GI bleed 11/20/2018   Goals of care, counseling/discussion 08/31/2021   Gout, arthritis    Hemorrhoids    History of heart attack 09/2003   History of lower GI bleeding 03/02/2015   History of prostate cancer 05/08/2012   Adenocarcinoma,gleason=3+3=6,PSA=8.26,vol=57cc  75 seeds treatment   HTN (hypertension)    Hyperlipidemia    Hypotension due to hypovolemia    IDA (iron deficiency anemia)    Lower GI bleed 07/15/2022   Myalgia due to statin    NSTEMI (non-ST elevated myocardial infarction) (HCC) 10/17/2003   a.) LHC 10/18/2003 --> 95% oLAD with multiple 50% sequential lesions --> referred to [facility]. b.) CABG x 1 (LIMA-LAD) 10/19/2003.   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 surgery.    BILATERAL CARPAL TUNNEL RELEASE Right 06/28/2001   Right (endoscopic)   CARPECTOMY Right    wrist   CATARACT EXTRACTION W/ INTRAOCULAR LENS  IMPLANT, BILATERAL     COLON SURGERY     COLONOSCOPY WITH PROPOFOL N/A 10/31/2016   Procedure: COLONOSCOPY WITH PROPOFOL;  Surgeon: Kieth Brightly, MD;  Location: [facility] ENDOSCOPY;  Service: Endoscopy;  Laterality: N/A;   COLONOSCOPY WITH PROPOFOL N/A 08/24/2021   Procedure: COLONOSCOPY WITH PROPOFOL;  Surgeon: Toney Reil, MD;  Location:  [facility] ENDOSCOPY;  Service: Gastroenterology;  Laterality: N/A;   COLONOSCOPY WITH PROPOFOL N/A 07/16/2022   Procedure: COLONOSCOPY WITH PROPOFOL;  Surgeon: Toney Reil, MD;  Location: [facility] ENDOSCOPY;  Service: Gastroenterology;  Laterality: N/A;   CORONARY ARTERY BYPASS GRAFT  10/19/2003   Procedure: CORONARY ARTERY BYPASS GRAFT; Locaation: [facility]; Surgeon:  Carlyon Prows, IV, MD   DORSAL COMPARTMENT RELEASE Left 06/25/2017   Procedure: LEFT WRIST TENDON SHEATH RELEASE;  Surgeon: Mack Hook, MD;  Location: [facility];  Service: Orthopedics;  Laterality: Left;   EMBOLIZATION (CATH LAB) N/A 11/21/2018   Procedure: EMBOLIZATION (Colonic);  Surgeon: Renford Dills, MD;  Location: [facility] INVASIVE CV LAB;  Service: Cardiovascular;  Laterality: N/A;   ESOPHAGOGASTRODUODENOSCOPY (EGD) WITH PROPOFOL N/A 08/24/2021   Procedure: ESOPHAGOGASTRODUODENOSCOPY (EGD) WITH PROPOFOL;  Surgeon: Toney Reil, MD;  Location: [facility] ENDOSCOPY;  Service: Gastroenterology;  Laterality: N/A;   FOOT SURGERY Bilateral    GOUT   KNEE ARTHROSCOPY Bilateral 1986   Right: 1986; left 09/06/2009   LAPAROSCOPIC RIGHT COLECTOMY N/A 10/11/2021   Procedure: LAPAROSCOPIC RIGHT COLECTOMY-RNFA to assist Converted to open procedure;  Surgeon: Leafy Ro, MD;  Location: [facility] ORS;  Service: General;  Laterality: N/A;  Provider is requesting 3 hours(180 minutes) for this case   LEFT ELBOW SURGERY  AGE 84  &  2007   LEFT HEART CATH AND CORONARY ANGIOGRAPHY  10/18/2003   [facility] (non-STEMI): Ostial LAD 95% with multiple sequential 50% stenoses. => Referred for CABG   NM GATED MYOCARDIAL STUDY (ARMX HX)  07/31/2016   Treadmill Myoview/Cardiolite : [facility]): 4.6 METS, 92 % MPHR.  EF 55 to 60%.  Normal wall motion.  No ischemia or infarction.   OLECRANON BURSA EXCISION Right 06/28/2001   OLECRANON BURSA EXCISION Left 06/24/2007   RADIOACTIVE SEED IMPLANT N/A 08/02/2012   Procedure:  RADIOACTIVE SEED IMPLANT;  Surgeon: Garnett Farm, MD;  Location: [facility];  Service: Urology;  Laterality: N/A;   TOTAL KNEE ARTHROPLASTY Bilateral 04/14/2004   RIGHT 2006; LEFT 11/21/2010   TRANSTHORACIC ECHOCARDIOGRAM  07/31/2016   ([facility]) normal LV function.  Mild LVH.  EF 50%.  Normal RV.  Mild MR, TR and PR.  No stenosis.    Current Outpatient Medications:    allopurinol (ZYLOPRIM) 300 MG tablet, Take 1 tablet (300 mg total) by mouth daily., Disp: 90 tablet, Rfl: 3   loperamide (IMODIUM) 2 MG capsule, Take 1 capsule (2 mg total) by mouth as needed for diarrhea or loose stools., Disp: 90 capsule, Rfl: 2   Potassium 99 MG TABS, Take 1 tablet by mouth daily as needed (leg cramps)., Disp: , Rfl:   Allergies  Allergen Reactions   Indocin [Indomethacin] Other (See Comments)    Shakes. tremors   Iodinated Contrast Media Hives  Penicillins Itching, Rash and Other (See Comments)    Did it involve swelling of the face/tongue/throat, SOB, or low BP? Unknown Did it involve sudden or severe rash/hives, skin peeling, or any reaction on the inside of your mouth or nose? Unknown Did you need to seek medical attention at a hospital or doctor's office? Unknown When did it last happen? unknown If all above answers are "NO", may proceed with cephalosporin use.    Review of Systems Objective:  There were no vitals filed for this visit.  General: Well developed, nourished, in no acute distress, alert and oriented x3   Dermatological: Skin is warm, dry and supple bilateral. Nails x 10 are well maintained; remaining integument appears unremarkable at this time. There are no open sores, no preulcerative lesions, no rash or signs of infection present.  Ingrown nail margin tibial-fibular border of the hallux left no purulence no malodor no signs of infection.  Vascular: Dorsalis Pedis artery and Posterior Tibial artery pedal pulses are 2/4 bilateral with immedate  capillary fill time. Pedal hair growth present. No varicosities and no lower extremity edema present bilateral.   Neruologic: Grossly intact via light touch bilateral. Vibratory intact via tuning fork bilateral. Protective threshold with Semmes Wienstein monofilament intact to all pedal sites bilateral. Patellar and Achilles deep tendon reflexes 2+ bilateral. No Babinski or clonus noted bilateral.   Musculoskeletal: No gross boney pedal deformities bilateral. No pain, crepitus, or limitation noted with foot and ankle range of motion bilateral. Muscular strength 5/5 in all groups tested bilateral.  Gait: Unassisted, Nonantalgic.    Radiographs:  None taken  Assessment & Plan:   Assessment: Ingrown toenail  Plan: Clipped the margin of the toenail out.     Kyson Kupper T. Hart[place], [place]

## 2023-09-12 ENCOUNTER — Ambulatory Visit (INDEPENDENT_AMBULATORY_CARE_PROVIDER_SITE_OTHER): Payer: Medicare Other | Admitting: Family Medicine

## 2023-09-12 DIAGNOSIS — M1A9XX0 Chronic gout, unspecified, without tophus (tophi): Secondary | ICD-10-CM

## 2023-09-12 DIAGNOSIS — R151 Fecal smearing: Secondary | ICD-10-CM

## 2023-09-13 ENCOUNTER — Encounter: Payer: Self-pay | Admitting: Family Medicine

## 2023-09-13 MED ORDER — LOPERAMIDE HCL 2 MG PO CAPS: 2.0000 mg | ORAL_CAPSULE | ORAL | 2 refills | Status: DC | PRN

## 2023-09-13 MED ORDER — ALLOPURINOL 300 MG PO TABS: 300.0000 mg | ORAL_TABLET | Freq: Every day | ORAL | 3 refills | Status: DC

## 2023-09-13 NOTE — Progress Notes (Signed)
 Medications refilled

## 2023-09-17 ENCOUNTER — Ambulatory Visit (INDEPENDENT_AMBULATORY_CARE_PROVIDER_SITE_OTHER): Payer: Medicare Other | Admitting: *Deleted

## 2023-09-17 VITALS — Ht 69.5 in | Wt 199.0 lb

## 2023-09-17 DIAGNOSIS — Z0000 Encounter for general adult medical examination without abnormal findings: Secondary | ICD-10-CM

## 2023-09-17 NOTE — Progress Notes (Signed)
 Subjective:   Phillip Nelson is a 89 y.o. who presents for a Medicare Wellness preventive visit.  As a reminder, Annual Wellness Visits don't include a physical exam, and some assessments may be limited, especially if this visit is performed virtually. We may recommend an in-person follow-up visit with your provider if needed.  Visit Complete: Virtual I connected with  Phillip Nelson Phillip Nelson on 09/17/23 by a audio enabled telemedicine application and verified that I am speaking with the correct person using two identifiers.  Patient Location: Home  Provider Location: Home Office  I discussed the limitations of evaluation and management by telemedicine. The patient expressed understanding and agreed to proceed.  Vital Signs: Because this visit was a virtual/telehealth visit, some criteria may be missing or patient reported. Any vitals not documented were not able to be obtained and vitals that have been documented are patient reported.  VideoDeclined- This patient declined Librarian, academic. Therefore the visit was completed with audio only.  Persons Participating in Visit: Patient.  AWV Questionnaire: No: Patient Medicare AWV questionnaire was not completed prior to this visit.  Cardiac Risk Factors include: advanced age (>66men, >49 women);male gender;dyslipidemia;Other (see comment), Risk factor comments: CAD     Objective:    Today's Vitals   09/17/23 1507  Weight: 199 lb (90.3 kg)  Height: 5' 9.5 (1.765 m)   Body mass index is 28.97 kg/m.     09/17/2023    3:20 PM 01/03/2023    3:52 AM 09/14/2022    2:43 PM 08/28/2022    4:46 PM 08/28/2022    2:00 PM 08/27/2022    7:25 PM 07/16/2022   10:58 AM  Advanced Directives  Does Patient Have a Medical Advance Directive? No Yes No No No No No  Type of Energy manager of Healthcare Power of Attorney in Chart?       No - copy requested  Would patient like  information on creating a medical advance directive? No - Patient declined  No - Patient declined No - Patient declined No - Patient declined  No - Patient declined    Current Medications (verified) Outpatient Encounter Medications as of 09/17/2023  Medication Sig   allopurinol  (ZYLOPRIM ) 300 MG tablet Take 1 tablet (300 mg total) by mouth daily.   aspirin  EC 81 MG tablet Take 81 mg by mouth daily. Swallow whole.   loperamide  (IMODIUM ) 2 MG capsule Take 1 capsule (2 mg total) by mouth as needed for diarrhea or loose stools.   Potassium 99 MG TABS Take 1 tablet by mouth daily as needed (leg cramps).   No facility-administered encounter medications on file as of 09/17/2023.    Allergies (verified) Indocin [indomethacin], Iodinated contrast media, and Penicillins   History: Past Medical History:  Diagnosis Date   Acute lower GI bleeding 07/14/2022   Adenocarcinoma of prostate (HCC) 05/08/2012   a.) Gleason = 3+3 = 6; PSA = 8.26; volume = 57cc; b.) s/p brachytherapy   Adenocarcinoma of transverse colon (HCC) 08/24/2021   a.) Bx (+) for moderately differentiated adenocarcinoma   Aortic atherosclerosis (HCC)    Aural vertigo    AVM (arteriovenous malformation) of duodenum, acquired    Basal cell carcinoma 12/31/2006   L mid infraclavicular. Excised 02/27/2007, margins free.   Basal cell carcinoma 10/30/2016    R post shoulder. Nodular pattern.   Basal cell carcinoma 03/31/2020   L lat pretibial - ED&C  Coronary artery disease involving native coronary artery of native heart with other form of angina pectoris (HCC) 09/2003   a.) followed by [facility] [place]); transitioned to [facility] [place]) 06/2021; b.) NSTEMI 09/2003 (Sx= heaviness in chest ~100lb brick, no SOB); LHC --> 95% oLAD with diffuse 50% stenosis -> referred to CVTS; c.) s/p CABG x 1 (LIMA-LAD) 10/19/2003; d.) Non-Ischemic Cardiolite 07/2016   Daily headache 09/22/2019   Diastolic dysfunction    a.) TTE  08/04/2014: EF 45%, mild global HK, mild LVH, mild MAC, mild TR/PR; b.) TTE 08/10/2016: EF 50%, mild LVH, mild MAC, mild MR/TR/PR; c.) TTE 07/29/2021: ED 55-60%, G1DD, Ao sclerosis with no stenosis.   Diverticulitis    DOE (dyspnea on exertion)    DOE (dyspnea on exertion) 07/12/2021   ED (erectile dysfunction)    Frequent headaches    GI bleed 11/20/2018   Goals of care, counseling/discussion 08/31/2021   Gout, arthritis    Hemorrhoids    History of heart attack 09/2003   History of lower GI bleeding 03/02/2015   History of prostate cancer 05/08/2012   Adenocarcinoma,gleason=3+3=6,PSA=8.26,vol=57cc  75 seeds treatment   HTN (hypertension)    Hyperlipidemia    Hypotension due to hypovolemia    IDA (iron  deficiency anemia)    Lower GI bleed 07/15/2022   Myalgia due to statin    NSTEMI (non-ST elevated myocardial infarction) (HCC) 10/17/2003   a.) LHC 10/18/2003 --> 95% oLAD with multiple 50% sequential lesions --> referred to CVTS. b.) CABG x 1 (LIMA-LAD) 10/19/2003.   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 surgery.    BILATERAL CARPAL TUNNEL RELEASE Right 06/28/2001   Right (endoscopic)   CARPECTOMY Right    wrist   CATARACT EXTRACTION W/ INTRAOCULAR LENS  IMPLANT, BILATERAL     COLON SURGERY     COLONOSCOPY WITH PROPOFOL  N/A 10/31/2016    Procedure: COLONOSCOPY WITH PROPOFOL ;  Surgeon: Dellie Louanne MATSU, MD;  Location: [facility] ENDOSCOPY;  Service: Endoscopy;  Laterality: N/A;   COLONOSCOPY WITH PROPOFOL  N/A 08/24/2021   Procedure: COLONOSCOPY WITH PROPOFOL ;  Surgeon: Unk Corinn Skiff, MD;  Location: [facility] ENDOSCOPY;  Service: Gastroenterology;  Laterality: N/A;   COLONOSCOPY WITH PROPOFOL  N/A 07/16/2022   Procedure: COLONOSCOPY WITH PROPOFOL ;  Surgeon: Unk Corinn Skiff, MD;  Location: [facility] ENDOSCOPY;  Service: Gastroenterology;  Laterality: N/A;   CORONARY ARTERY BYPASS GRAFT  10/19/2003   Procedure: CORONARY ARTERY BYPASS GRAFT; Locaation: [facility]; Surgeon:  Zachary Carlin Remington, IV, MD   DORSAL COMPARTMENT RELEASE Left 06/25/2017   Procedure: LEFT WRIST TENDON SHEATH RELEASE;  Surgeon: Sebastian Lenis, MD;  Location: [facility];  Service: Orthopedics;  Laterality: Left;   EMBOLIZATION (CATH LAB) N/A 11/21/2018   Procedure:  EMBOLIZATION (Colonic);  Surgeon: Jama Cordella MATSU, MD;  Location: [facility] INVASIVE CV LAB;  Service: Cardiovascular;  Laterality: N/A;   ESOPHAGOGASTRODUODENOSCOPY (EGD) WITH PROPOFOL  N/A 08/24/2021   Procedure: ESOPHAGOGASTRODUODENOSCOPY (EGD) WITH PROPOFOL ;  Surgeon: Unk Corinn Skiff, MD;  Location: [facility] ENDOSCOPY;  Service: Gastroenterology;  Laterality: N/A;   FOOT SURGERY Bilateral    GOUT   KNEE ARTHROSCOPY Bilateral 1986   Right: 1986; left 09/06/2009   LAPAROSCOPIC RIGHT COLECTOMY N/A 10/11/2021   Procedure: LAPAROSCOPIC RIGHT COLECTOMY-RNFA to assist Converted to open procedure;  Surgeon: Jordis Laneta FALCON, MD;  Location: [facility] ORS;  Service: General;  Laterality: N/A;  Provider is requesting 3 hours(180 minutes) for this case   LEFT ELBOW SURGERY  AGE 74  &  2007   LEFT HEART CATH AND CORONARY ANGIOGRAPHY  10/18/2003   [facility] (non-STEMI): Ostial LAD 95% with multiple sequential 50% stenoses. => Referred for CABG   NM GATED MYOCARDIAL STUDY (ARMX HX)  07/31/2016   Treadmill  Myoview /Cardiolite : [facility]): 4.6 METS, 92 % MPHR.  EF 55 to 60%.  Normal wall motion.  No ischemia or infarction.   OLECRANON BURSA EXCISION Right 06/28/2001   OLECRANON BURSA EXCISION Left 06/24/2007   RADIOACTIVE SEED IMPLANT N/A 08/02/2012   Procedure: RADIOACTIVE SEED IMPLANT;  Surgeon: Mark C Ottelin, MD;  Location: [facility];  Service: Urology;  Laterality: N/A;   TOTAL KNEE ARTHROPLASTY Bilateral 04/14/2004   RIGHT 2006; LEFT 11/21/2010   TRANSTHORACIC ECHOCARDIOGRAM  07/31/2016   ([facility]) normal LV function.  Mild LVH.  EF 50%.  Normal RV.  Mild MR, TR and PR.  No stenosis.   Family History  Problem Relation Age of Onset   Cerebral aneurysm Mother    Alzheimer's disease Father    Lung cancer Sister 43       treated surgically   Cancer - Lung Sister    Social History   Socioeconomic History   Marital status: Media planner    Spouse name: Merlynn   Number of children: 1   Years of education: Not on file   Highest education level: Some college, no degree  Occupational History   Occupation: Retired  Tobacco Use   Smoking status: Former    Current packs/day: 0.00    Average packs/day: 3.0 packs/day for 31.0 years (93.0 ttl pk-yrs)    Types: Cigarettes    Start date: 07/31/1956    Quit date: 08/01/1987    Years since quitting: 36.1   Smokeless tobacco: Never  Vaping Use   Vaping status: Never Used  Substance and Sexual Activity   Alcohol use: Not Currently    Comment: once a year on NYE   Drug use: No   Sexual activity: Not Currently  Other Topics Concern   Not on file  Social History Narrative   09/22/19   From: the area   Living: with partner - Merlynn Bowers (dementia) 1984   Work: retired - Marine scientist shop      Family: 1 son - but does not have a relationship      Enjoys: golf, reading      Exercise: golf once a week, yardwork   Diet: not great due to caring for wife -> also limits his activity level.   He does all of the housework including cooking, cleaning, laundry, vacuuming and yard work.      Safety   Seat belts: Yes    Guns: Yes  and secure   Safe in  relationships: Yes    Social Drivers of Corporate investment banker Strain: Low Risk  (09/17/2023)   Overall Financial Resource Strain (CARDIA)    Difficulty of Paying Living Expenses: Not hard at all  Food Insecurity: No Food Insecurity (09/17/2023)   Hunger Vital Sign    Worried About Running Out of Food in the Last Year: Never true    Ran Out of Food in the Last Year: Never true  Transportation Needs: No Transportation Needs (09/17/2023)   PRAPARE - Administrator, Civil Service (Medical): No    Lack of Transportation (Non-Medical): No  Physical Activity: Inactive (09/17/2023)   Exercise Vital Sign    Days of Exercise per Week: 0 days    Minutes of Exercise per Session: 0 min  Stress: No Stress Concern Present (09/17/2023)   [employer] of Occupational Health - Occupational Stress Questionnaire    Feeling of Stress: Not at all  Social Connections: Moderately Isolated (09/17/2023)   Social Connection and Isolation Panel    Frequency of Communication with Friends and Family: More than three times a week    Frequency of Social Gatherings with Friends and Family: Twice a week    Attends Religious Services: Never    Database administrator or Organizations: No    Attends Engineer, structural: Never    Marital Status: Living with partner    Tobacco Counseling Counseling given: Not Answered    Clinical Intake:  Pre-visit preparation completed: Yes  Pain : No/denies pain     BMI - recorded: 28.97 Nutritional Status: BMI 25 -29 Overweight Nutritional Risks: None Diabetes: No  Lab Results  Component Value Date   HGBA1C 5.8 06/12/2022   HGBA1C 6.3 07/12/2021   HGBA1C 5.8 09/30/2020     How often do you need to have someone help you when you read instructions, pamphlets, or other written  materials from your doctor or pharmacy?: 1 - Never  Interpreter Needed?: No  Information entered by :: R. Kenyatte Chatmon LPN   Activities of Daily Living     09/17/2023    3:09 PM  In your present state of health, do you have any difficulty performing the following activities:  Hearing? 0  Vision? 0  Comment readers  Difficulty concentrating or making decisions? 1  Walking or climbing stairs? 0  Dressing or bathing? 0  Doing errands, shopping? 0  Preparing Food and eating ? N  Using the Toilet? N  In the past six months, have you accidently leaked urine? N  Do you have problems with loss of bowel control? N  Managing your Medications? N  Managing your Finances? N  Housekeeping or managing your Housekeeping? N    Patient Care Team: Hope Merle, MD as PCP - General (Family Medicine) Anner Alm ORN, MD as PCP - Cardiology (Cardiology) Herminio Miu, MD (Otolaryngology) Hester Alm BROCKS, MD (Dermatology) Hester Wolm PARAS, MD as Consulting Physician (Cardiology) Jaye Fallow, MD (Ophthalmology)  I have updated your Care Teams any recent Medical Services you may have received from other providers in the past year.     Assessment:   This is a routine wellness examination for Phillip Nelson.  Hearing/Vision screen Hearing Screening - Comments:: No issues Vision Screening - Comments:: readers   Goals Addressed             This Visit's Progress    Patient Stated       Wants to walk more and continue to play golf  Depression Screen     09/17/2023    3:16 PM 03/20/2023    1:05 PM 12/13/2022    1:15 PM 09/14/2022    2:36 PM 06/12/2022    2:35 PM 05/09/2022    1:18 PM 01/11/2022    8:23 AM  PHQ 2/9 Scores  PHQ - 2 Score 0 0 0 0 0 0 0  PHQ- 9 Score 0 0 0   0     Fall Risk     09/17/2023    3:11 PM 12/13/2022    1:15 PM 09/14/2022    2:42 PM 06/12/2022    2:35 PM 05/09/2022    1:18 PM  Fall Risk   Falls in the past year? 0 0 0 0 0  Number falls in past yr: 0 0  0 0 0  Injury with Fall? 0 0 0 0 0  Risk for fall due to : No Fall Risks No Fall Risks No Fall Risks No Fall Risks No Fall Risks  Follow up Falls evaluation completed;Falls prevention discussed Falls evaluation completed Falls prevention discussed Falls evaluation completed Falls evaluation completed    MEDICARE RISK AT HOME:  Medicare Risk at Home Any stairs in or around the home?: Yes If so, are there any without handrails?: No Home free of loose throw rugs in walkways, pet beds, electrical cords, etc?: No Adequate lighting in your home to reduce risk of falls?: Yes Life alert?: No Use of a cane, walker or w/c?: No Grab bars in the bathroom?: No Shower chair or bench in shower?: No Elevated toilet seat or a handicapped toilet?: Yes  TIMED UP AND GO:  Was the test performed?  No  Cognitive Function: 6CIT completed    09/09/2020   10:09 AM  MMSE - Mini Mental State Exam  Not completed: Refused        09/17/2023    3:21 PM 09/14/2022    2:36 PM 12/11/2017    9:07 AM  6CIT Screen  What Year? 0 points 0 points 0 points  What month? 0 points 0 points 0 points  What time? 0 points 0 points 0 points  Count back from 20 0 points 0 points 0 points  Months in reverse 0 points 0 points 0 points  Repeat phrase 0 points 0 points 0 points  Total Score 0 points 0 points 0 points    Immunizations Immunization History  Administered Date(s) Administered   Fluad Quad(high Dose 65+) 12/24/2018, 12/13/2021   Fluad Trivalent(High Dose 65+) 12/13/2022   Influenza, High Dose Seasonal PF 02/02/2015, 01/10/2017, 01/10/2018, 01/14/2021   Influenza-Unspecified 01/02/2013, 01/08/2014, 02/02/2015   PFIZER(Purple Top)SARS-COV-2 Vaccination 04/17/2019, 05/08/2019, 12/29/2019, 07/16/2020   PNEUMOCOCCAL CONJUGATE-20 01/11/2022   Pfizer(Comirnaty)Fall Seasonal Vaccine 12 years and older 01/01/2023   Pneumococcal Conjugate-13 08/10/2010   Pneumococcal Polysaccharide-23 01/02/2003, 08/10/2014   Tdap  08/10/2014    Screening Tests Health Maintenance  Topic Date Due   Zoster Vaccines- Shingrix (1 of 2) Never done   COVID-19 Vaccine (6 - Pfizer risk 2024-25 season) 07/02/2023   Medicare Annual Wellness (AWV)  09/14/2023   INFLUENZA VACCINE  10/26/2023   DTaP/Tdap/Td (2 - Td or Tdap) 08/09/2024   Pneumococcal Vaccine: 50+ Years  Completed   HPV VACCINES  Aged Out   Meningococcal B Vaccine  Aged Out    Health Maintenance  Health Maintenance Due  Topic Date Due   Zoster Vaccines- Shingrix (1 of 2) Never done   COVID-19 Vaccine (6 - Pfizer risk 2024-25 season)  07/02/2023   Medicare Annual Wellness (AWV)  09/14/2023   Health Maintenance Items Addressed: Patient declines shingles vaccines  Additional Screening:  Vision Screening: Recommended annual ophthalmology exams for early detection of glaucoma and other disorders of the eye.Overdue [facility] Patient was advised that he needs to call and schedule an eye appointment Would you like a referral to an eye doctor? No    Dental Screening: Recommended annual dental exams for proper oral hygiene  Community Resource Referral / Chronic Care Management: CRR required this visit?  No   CCM required this visit?  No   Plan:    I have personally reviewed and noted the following in the patient's chart:   Medical and social history Use of alcohol, tobacco or illicit drugs  Current medications and supplements including opioid prescriptions. Patient is not currently taking opioid prescriptions. Functional ability and status Nutritional status Physical activity Advanced directives List of other physicians Hospitalizations, surgeries, and ER visits in previous 12 months Vitals Screenings to include cognitive, depression, and falls Referrals and appointments  In addition, I have reviewed and discussed with patient certain preventive protocols, quality metrics, and best practice recommendations. A written personalized care plan  for preventive services as well as general preventive health recommendations were provided to patient.   Angeline Fredericks, LPN   [id number]   After Visit Summary: (MyChart) Due to this being a telephonic visit, the after visit summary with patients personalized plan was offered to patient via MyChart   Notes: Nothing significant to report at this time.

## 2023-09-17 NOTE — Patient Instructions (Addendum)
 Phillip Nelson , Thank you for taking time out of your busy schedule to complete your Annual Wellness Visit with me. I enjoyed our conversation and look forward to speaking with you again next year. I, as well as your care team,  appreciate your ongoing commitment to your health goals. Please review the following plan we discussed and let me know if I can assist you in the future. Your Game plan/ To Do List    Referrals: If you haven't heard from the office you've been referred to, please reach out to them at the phone provided.  Consider updating your shingles vaccines. Remember to call and schedule an eye appointment. Follow up Visits: Next Medicare AWV with our clinical staff: 09/17/24 @ 1:40   Have you seen your provider in the last 6 months (3 months if uncontrolled diabetes)? Yes Next Office Visit with your provider: 09/19/23  Clinician Recommendations:  Aim for 30 minutes of exercise or brisk walking, 6-8 glasses of water , and 5 servings of fruits and vegetables each day.       This is a list of the screening recommended for you and due dates:  Health Maintenance  Topic Date Due   Zoster (Shingles) Vaccine (1 of 2) Never done   COVID-19 Vaccine (6 - Pfizer risk 2024-25 season) 07/02/2023   Flu Shot  10/26/2023   DTaP/Tdap/Td vaccine (2 - Td or Tdap) 08/09/2024   Medicare Annual Wellness Visit  09/16/2024   Pneumococcal Vaccine for age over 12  Completed   HPV Vaccine  Aged Out   Meningitis B Vaccine  Aged Out    Advanced directives: (Declined) Advance directive discussed with you today. Even though you declined this today, please call our office should you change your mind, and we can give you the proper paperwork for you to fill out. Advance Care Planning is important because it:  [x]  Makes sure you receive the medical care that is consistent with your values, goals, and preferences  [x]  It provides guidance to your family and loved ones and reduces their decisional burden about  whether or not they are making the right decisions based on your wishes.

## 2023-09-19 ENCOUNTER — Ambulatory Visit (INDEPENDENT_AMBULATORY_CARE_PROVIDER_SITE_OTHER): Admitting: Internal Medicine

## 2023-09-19 ENCOUNTER — Encounter: Payer: Self-pay | Admitting: Internal Medicine

## 2023-09-19 VITALS — BP 122/78 | HR 85 | Temp 97.5°F | Ht 69.5 in | Wt 208.4 lb

## 2023-09-19 DIAGNOSIS — D500 Iron deficiency anemia secondary to blood loss (chronic): Secondary | ICD-10-CM

## 2023-09-19 DIAGNOSIS — E785 Hyperlipidemia, unspecified: Secondary | ICD-10-CM | POA: Diagnosis not present

## 2023-09-19 DIAGNOSIS — I2510 Atherosclerotic heart disease of native coronary artery without angina pectoris: Secondary | ICD-10-CM

## 2023-09-19 DIAGNOSIS — M1A9XX0 Chronic gout, unspecified, without tophus (tophi): Secondary | ICD-10-CM

## 2023-09-19 DIAGNOSIS — R7303 Prediabetes: Secondary | ICD-10-CM | POA: Diagnosis not present

## 2023-09-19 LAB — LIPID PANEL
Cholesterol: 172 mg/dL (ref 0–200)
HDL: 64.2 mg/dL (ref >39.00)
LDL Cholesterol: 79 mg/dL (ref 0–99)
NonHDL: 107.98
Total CHOL/HDL Ratio: 3
Triglycerides: 146 mg/dL (ref 0.0–149.0)
VLDL: 29.2 mg/dL (ref 0.0–40.0)

## 2023-09-19 LAB — COMPREHENSIVE METABOLIC PANEL WITH GFR
ALT: 33 U/L (ref 0–53)
AST: 32 U/L (ref 0–37)
Albumin: 4.4 g/dL (ref 3.5–5.2)
Alkaline Phosphatase: 64 U/L (ref 39–117)
BUN: 17 mg/dL (ref 6–23)
CO2: 26 meq/L (ref 19–32)
Calcium: 9.3 mg/dL (ref 8.4–10.5)
Chloride: 104 meq/L (ref 96–112)
Creatinine, Ser: 1 mg/dL (ref 0.40–1.50)
GFR: 68.12 mL/min (ref >60.00)
Glucose, Bld: 96 mg/dL (ref 70–99)
Potassium: 4.4 meq/L (ref 3.5–5.1)
Sodium: 137 meq/L (ref 135–145)
Total Bilirubin: 1.2 mg/dL (ref 0.2–1.2)
Total Protein: 7.6 g/dL (ref 6.0–8.3)

## 2023-09-19 LAB — CBC WITH DIFFERENTIAL/PLATELET
Basophils Absolute: 0 10*3/uL (ref 0.0–0.1)
Basophils Relative: 0.5 % (ref 0.0–3.0)
Eosinophils Absolute: 0.3 10*3/uL (ref 0.0–0.7)
Eosinophils Relative: 3.1 % (ref 0.0–5.0)
HCT: 45.3 % (ref 39.0–52.0)
Hemoglobin: 14.9 g/dL (ref 13.0–17.0)
Lymphocytes Relative: 26.2 % (ref 12.0–46.0)
Lymphs Abs: 2.6 10*3/uL (ref 0.7–4.0)
MCHC: 32.8 g/dL (ref 30.0–36.0)
MCV: 98.5 fl (ref 78.0–100.0)
Monocytes Absolute: 0.8 10*3/uL (ref 0.1–1.0)
Monocytes Relative: 8.1 % (ref 3.0–12.0)
Neutro Abs: 6.2 10*3/uL (ref 1.4–7.7)
Neutrophils Relative %: 62.1 % (ref 43.0–77.0)
Platelets: 284 10*3/uL (ref 150.0–400.0)
RBC: 4.6 Mil/uL (ref 4.22–5.81)
RDW: 13.6 % (ref 11.5–15.5)
WBC: 9.9 10*3/uL (ref 4.0–10.5)

## 2023-09-19 LAB — HEMOGLOBIN A1C: Hgb A1c MFr Bld: 5.8 % (ref 4.6–6.5)

## 2023-09-19 LAB — URIC ACID: Uric Acid, Serum: 6.1 mg/dL (ref 4.0–7.8)

## 2023-09-19 NOTE — Patient Instructions (Addendum)
-   It was a pleasure meeting you today -Your blood pressure is well-controlled.  Keep up the great work! - We will check some blood work on you including your cholesterol, uric acid, kidney function, liver function and blood counts -We will contact you with these results as well -Continue to remain active - If you have any questions or concerns please contact us

## 2023-09-19 NOTE — Progress Notes (Signed)
 Established Patient Office Visit  Subjective   Patient ID: Phillip Nelson, male    DOB: 1934-10-14  Age: 89 y.o. MRN: [id number]  Chief Complaint  Patient presents with   Medical Management of Chronic Issues    HPI  Patient is here for follow-up of his chronic medical issues.  He denies any new complaints today.  Patient does have a history of intermittent diarrhea but states that he has not had many episodes recently.  He does take loperamide  as needed and takes this when he plays golf every Sunday in order to avoid an accident but has not had any accidents recently.  Patient also has a history of gout but has not had any acute flares recently.  He is stable on allopurinol  300 mg daily.  Patient does have a history of coronary artery disease and follows up with cardiology for this.  He was recently started on aspirin .  Denies any chest pain.  He is unable to tolerate statin secondary to muscle pain.    Review of Systems  Constitutional: Negative.   HENT: Negative.    Respiratory: Negative.    Cardiovascular: Negative.   Musculoskeletal: Negative.   Neurological: Negative.   Psychiatric/Behavioral: Negative.        Objective:     BP 122/78   Pulse 85   Temp (!) 97.5 F (36.4 C)   Ht 5' 9.5 (1.765 m)   Wt 208 lb 6.4 oz (94.5 kg)   SpO2 97%   BMI 30.33 kg/m    Physical Exam Constitutional:      Appearance: Normal appearance.  HENT:     Head: Normocephalic and atraumatic.     Mouth/Throat:     Pharynx: Oropharynx is clear. No oropharyngeal exudate or posterior oropharyngeal erythema.   Cardiovascular:     Rate and Rhythm: Normal rate and regular rhythm.     Heart sounds: Normal heart sounds.  Pulmonary:     Breath sounds: Normal breath sounds. No wheezing or rales.  Abdominal:     General: There is no distension.     Palpations: Abdomen is soft.     Tenderness: There is no abdominal tenderness. There is no guarding or rebound.   Musculoskeletal:         General: No swelling or tenderness.     Cervical back: Neck supple.  Lymphadenopathy:     Cervical: No cervical adenopathy.   Neurological:     Mental Status: He is alert and oriented to person, place, and time.   Psychiatric:        Mood and Affect: Mood normal.        Behavior: Behavior normal.      No results found for any visits on 09/19/23.    The ASCVD Risk score (Arnett DK, et al., 2019) failed to calculate for the following reasons:   The 2019 ASCVD risk score is only valid for ages 46 to 3   Risk score cannot be calculated because patient has a medical history suggesting prior/existing ASCVD    Assessment & Plan:   Problem List Items Addressed This Visit       Cardiovascular and Mediastinum   Coronary artery disease involving native coronary artery of native heart without angina pectoris - Primary (Chronic)   - This problem chronic and stable -Patient denies any chest pain or shortness of breath currently -He plays golf every Sunday and and denies any issues with this activity -He recently saw cardiology in January and was  started on aspirin  for his history of CAD -Patient was unable to start statin secondary to statin induced myopathy -His blood pressure is well-controlled on no medications -No further workup at this time      Relevant Orders   Comprehensive metabolic panel with GFR     Other   Hyperlipidemia with target LDL less than 70 (Chronic)   - Patient has a history of  CAD -He is unable to take statin secondary to statin induced muscle pain - He is open to trying Zetia if needed -Will recheck lipid panel today -No further workup at this time      Relevant Orders   Comprehensive metabolic panel with GFR   Lipid panel   Anemia   - Patient has a history of a GI bleed and iron  deficiency anemia -His last hemoglobin in October of last year was within normal limits -Will recheck his CBC today -No further workup at this time       Relevant Orders   CBC with Differential/Platelet   Gout   - Patient has history of gout but has not had any recent flareups -Will continue with allopurinol  300 mg daily -Recheck uric acid level today -No further workup at this time      Relevant Orders   Uric acid   Prediabetes   - This problem is chronic and stable -Patient had a mildly elevated A1c in the prediabetic range (5.8) last year -Will recheck his A1c today -No further workup at this time      Relevant Orders   Comprehensive metabolic panel with GFR   Hemoglobin A1c    No follow-ups on file.    Oluwadamilola Deliz, MD

## 2023-09-19 NOTE — Assessment & Plan Note (Signed)
-   This problem chronic and stable -Patient denies any chest pain or shortness of breath currently -He plays golf every Sunday and and denies any issues with this activity -He recently saw cardiology in January and was started on aspirin  for his history of CAD -Patient was unable to start statin secondary to statin induced myopathy -His blood pressure is well-controlled on no medications -No further workup at this time

## 2023-09-19 NOTE — Assessment & Plan Note (Signed)
-   Patient has a history of  CAD -He is unable to take statin secondary to statin induced muscle pain - He is open to trying Zetia if needed -Will recheck lipid panel today -No further workup at this time

## 2023-09-19 NOTE — Assessment & Plan Note (Signed)
-   This problem is chronic and stable -Patient had a mildly elevated A1c in the prediabetic range (5.8) last year -Will recheck his A1c today -No further workup at this time

## 2023-09-19 NOTE — Assessment & Plan Note (Signed)
-   Patient has a history of a GI bleed and iron  deficiency anemia -His last hemoglobin in October of last year was within normal limits -Will recheck his CBC today -No further workup at this time

## 2023-09-19 NOTE — Assessment & Plan Note (Signed)
-   Patient has history of gout but has not had any recent flareups -Will continue with allopurinol  300 mg daily -Recheck uric acid level today -No further workup at this time

## 2023-09-21 ENCOUNTER — Ambulatory Visit: Payer: Self-pay | Admitting: Internal Medicine

## 2023-12-11 DIAGNOSIS — R1310 Dysphagia, unspecified: Secondary | ICD-10-CM | POA: Diagnosis not present

## 2023-12-11 DIAGNOSIS — R0982 Postnasal drip: Secondary | ICD-10-CM | POA: Diagnosis not present

## 2023-12-17 DIAGNOSIS — L578 Other skin changes due to chronic exposure to nonionizing radiation: Secondary | ICD-10-CM | POA: Diagnosis not present

## 2023-12-17 DIAGNOSIS — C44629 Squamous cell carcinoma of skin of left upper limb, including shoulder: Secondary | ICD-10-CM | POA: Diagnosis not present

## 2023-12-17 DIAGNOSIS — L570 Actinic keratosis: Secondary | ICD-10-CM | POA: Diagnosis not present

## 2023-12-17 DIAGNOSIS — Z85828 Personal history of other malignant neoplasm of skin: Secondary | ICD-10-CM | POA: Diagnosis not present

## 2023-12-17 DIAGNOSIS — L821 Other seborrheic keratosis: Secondary | ICD-10-CM | POA: Diagnosis not present

## 2023-12-31 DIAGNOSIS — C44629 Squamous cell carcinoma of skin of left upper limb, including shoulder: Secondary | ICD-10-CM | POA: Diagnosis not present

## 2023-12-31 DIAGNOSIS — Z85828 Personal history of other malignant neoplasm of skin: Secondary | ICD-10-CM | POA: Diagnosis not present

## 2024-01-04 DIAGNOSIS — Z23 Encounter for immunization: Secondary | ICD-10-CM | POA: Diagnosis not present

## 2024-01-23 DIAGNOSIS — H353131 Nonexudative age-related macular degeneration, bilateral, early dry stage: Secondary | ICD-10-CM | POA: Diagnosis not present

## 2024-01-23 DIAGNOSIS — H43813 Vitreous degeneration, bilateral: Secondary | ICD-10-CM | POA: Diagnosis not present

## 2024-01-23 DIAGNOSIS — H35373 Puckering of macula, bilateral: Secondary | ICD-10-CM | POA: Diagnosis not present

## 2024-01-23 DIAGNOSIS — M3501 Sicca syndrome with keratoconjunctivitis: Secondary | ICD-10-CM | POA: Diagnosis not present

## 2024-02-13 DIAGNOSIS — M79645 Pain in left finger(s): Secondary | ICD-10-CM | POA: Diagnosis not present

## 2024-02-13 DIAGNOSIS — M65342 Trigger finger, left ring finger: Secondary | ICD-10-CM | POA: Insufficient documentation

## 2024-02-14 NOTE — Telephone Encounter (Signed)
 open in error

## 2024-02-26 DIAGNOSIS — M65342 Trigger finger, left ring finger: Secondary | ICD-10-CM | POA: Diagnosis not present

## 2024-03-11 DIAGNOSIS — M65342 Trigger finger, left ring finger: Secondary | ICD-10-CM | POA: Diagnosis not present

## 2024-03-17 ENCOUNTER — Telehealth: Payer: Self-pay

## 2024-03-17 ENCOUNTER — Ambulatory Visit

## 2024-03-17 ENCOUNTER — Ambulatory Visit: Payer: Self-pay

## 2024-03-17 ENCOUNTER — Ambulatory Visit (INDEPENDENT_AMBULATORY_CARE_PROVIDER_SITE_OTHER)

## 2024-03-17 VITALS — BP 110/70 | HR 83 | Temp 98.3°F | Ht 70.0 in | Wt 215.2 lb

## 2024-03-17 DIAGNOSIS — Z8711 Personal history of peptic ulcer disease: Secondary | ICD-10-CM | POA: Diagnosis not present

## 2024-03-17 DIAGNOSIS — E785 Hyperlipidemia, unspecified: Secondary | ICD-10-CM

## 2024-03-17 DIAGNOSIS — R053 Chronic cough: Secondary | ICD-10-CM

## 2024-03-17 DIAGNOSIS — Z8739 Personal history of other diseases of the musculoskeletal system and connective tissue: Secondary | ICD-10-CM | POA: Diagnosis not present

## 2024-03-17 DIAGNOSIS — R197 Diarrhea, unspecified: Secondary | ICD-10-CM

## 2024-03-17 DIAGNOSIS — I2510 Atherosclerotic heart disease of native coronary artery without angina pectoris: Secondary | ICD-10-CM

## 2024-03-17 DIAGNOSIS — M65342 Trigger finger, left ring finger: Secondary | ICD-10-CM

## 2024-03-17 DIAGNOSIS — R7303 Prediabetes: Secondary | ICD-10-CM

## 2024-03-17 DIAGNOSIS — Z85038 Personal history of other malignant neoplasm of large intestine: Secondary | ICD-10-CM

## 2024-03-17 DIAGNOSIS — L989 Disorder of the skin and subcutaneous tissue, unspecified: Secondary | ICD-10-CM

## 2024-03-17 MED ORDER — ALLOPURINOL 300 MG PO TABS: 300.0000 mg | ORAL_TABLET | Freq: Every day | ORAL | 3 refills | Status: AC

## 2024-03-17 MED ORDER — LOPERAMIDE HCL 2 MG PO CAPS: 2.0000 mg | ORAL_CAPSULE | Freq: Every day | ORAL | 2 refills | Status: AC | PRN

## 2024-03-17 MED ORDER — LOPERAMIDE HCL 2 MG PO CAPS: 2.0000 mg | ORAL_CAPSULE | ORAL | 21 refills | Status: DC | PRN

## 2024-03-17 NOTE — Progress Notes (Signed)
 "  Established Patient Office Visit TOC from Dr. Hope    Subjective  Patient ID: Phillip Nelson, male    DOB: 1934-12-01  Age: 89 y.o. MRN: [id number]  Chief Complaint  Patient presents with   Establish Care   Skin Cancer    Discussed the use of AI scribe software for clinical note transcription with the patient, who gave verbal consent to proceed.  History of Present Illness Phillip Nelson is an 89 year old male with a history of prostate cancer (2014) and colon cancer (2023) who presents for transfer of care from previous PCP and medication management.   Cough intermittent for 3 months: He has been experiencing intermittent cough with phlegm production for the past three months. This symptom has been concerning to him, especially after a friend's wife passed away from cancer. No unintentional weight loss, fevers, or chills. He has a significant smoking history, having smoked three packs a day until quitting in 1984.  He has a history of prostate cancer diagnosed approximately ten years ago and colon cancer 2023, for which he underwent surgery. He has been cancer-free for two years and does not currently follow up with a GI doctor. He has intermittent diarrhea that he manages with Imodium  prn. He only takes this for times when he has to leave home for extended time (like playing golf, doctors appointment).   He experiences trigger finger, left pinky which has affected his ability to play golf. He has received two injections at [facility] and is awaiting surgery in thirty days if the condition does not improve.  He has a history of gout since 1968 and takes allopurinol  regularly to maintain normal uric acid levels. He occasionally takes naproxen, approximately 500 mg, when he feels a 'tingle' in his left foot, which helps prevent flare-ups. He has not had a gout flare-up in a long time.  CAD: He underwent open-heart surgery and takes aspirin  81 mg regularly. He takes  potassium periodically for cramps and Imodium  on "Sundays when he plays golf to prevent loose bowels.  For right forearm skin lesion he is undergoing surgery and recently saw dermatologist for this.      ROS As per HPI    Objective:     BP 110/70 (BP Location: Left Arm, Patient Position: Sitting, Cuff Size: Normal)   Pulse 83   Temp 98.3 F (36.8 C) (Oral)   Ht 5' 10 (1.778 m)   Wt 215 lb 3.2 oz (97.6 kg)   SpO2 99%   BMI 30.88 kg/m      03/17/2024   11:14 AM 09/19/2023    1:15 PM 09/17/2023    3:16 PM  Depression screen PHQ 2/9  Decreased Interest 0 0 0  Down, Depressed, Hopeless 0 0 0  PHQ - 2 Score 0 0 0  Altered sleeping 0 0 0  Tired, decreased energy 0 0 0  Change in appetite 0 0 0  Feeling bad or failure about yourself  0 0 0  Trouble concentrating 0 0 0  Moving slowly or fidgety/restless 0 0 0  Suicidal thoughts 0 0 0  PHQ-9 Score 0 0  0   Difficult doing work/chores Not difficult at all Not difficult at all Not difficult at all     Data saved with a previous flowsheet row definition      12" /22/2025   11:14 AM 09/19/2023    1:15 PM 03/20/2023    1:05 PM 12/13/2022    1:15 PM  GAD 7 : Generalized Anxiety Score  Nervous, Anxious, on Edge 0 0 0 0  Control/stop worrying 0 0 0 0  Worry too much - different things 0 0 0 0  Trouble relaxing 0 0 0 0  Restless 0 0 0 0  Easily annoyed or irritable 0 0 0 0  Afraid - awful might happen 0 0 0 0  Total GAD 7 Score 0 0 0 0  Anxiety Difficulty Not difficult at all Not difficult at all Not difficult at all Not difficult at all      03/17/2024   11:14 AM 09/19/2023    1:15 PM 09/17/2023    3:16 PM  Depression screen PHQ 2/9  Decreased Interest 0 0 0  Down, Depressed, Hopeless 0 0 0  PHQ - 2 Score 0 0 0  Altered sleeping 0 0 0  Tired, decreased energy 0 0 0  Change in appetite 0 0 0  Feeling bad or failure about yourself  0 0 0  Trouble concentrating 0 0 0  Moving slowly or fidgety/restless 0 0 0  Suicidal  thoughts 0 0 0  PHQ-9 Score 0 0  0   Difficult doing work/chores Not difficult at all Not difficult at all Not difficult at all     Data saved with a previous flowsheet row definition      03/17/2024   11:14 AM 09/19/2023    1:15 PM 03/20/2023    1:05 PM 12/13/2022    1:15 PM  GAD 7 : Generalized Anxiety Score  Nervous, Anxious, on Edge 0 0 0 0  Control/stop worrying 0 0 0 0  Worry too much - different things 0 0 0 0  Trouble relaxing 0 0 0 0  Restless 0 0 0 0  Easily annoyed or irritable 0 0 0 0  Afraid - awful might happen 0 0 0 0  Total GAD 7 Score 0 0 0 0  Anxiety Difficulty Not difficult at all Not difficult at all Not difficult at all Not difficult at all   SDOH Screenings   Food Insecurity: No Food Insecurity (09/17/2023)  Housing: Unknown (09/17/2023)  Transportation Needs: No Transportation Needs (09/17/2023)  Utilities: Not At Risk (09/17/2023)  Alcohol Screen: Low Risk (09/17/2023)  Depression (PHQ2-9): Low Risk (03/17/2024)  Financial Resource Strain: Low Risk (09/17/2023)  Physical Activity: Inactive (09/17/2023)  Social Connections: Moderately Isolated (09/17/2023)  Stress: No Stress Concern Present (09/17/2023)  Tobacco Use: Medium Risk (03/17/2024)  Health Literacy: Adequate Health Literacy (09/17/2023)     Physical Exam Constitutional:      General: He is not in acute distress. HENT:     Head: Normocephalic and atraumatic.     Right Ear: Tympanic membrane normal.     Left Ear: Tympanic membrane normal.     Nose: No congestion.     Mouth/Throat:     Mouth: Mucous membranes are moist.  Cardiovascular:     Rate and Rhythm: Normal rate.  Pulmonary:     Effort: Pulmonary effort is normal.     Breath sounds: Wheezing (mild bibasilar wheezing, resolved with deep inspiration) present.  Abdominal:     General: Bowel sounds are normal.     Palpations: Abdomen is soft.     Tenderness: There is no guarding.  Musculoskeletal:     Cervical back: Neck supple.      Right lower leg: No edema.     Left lower leg: No edema.  Skin:    General: Skin is warm.  Comments: Multiple s/p cryo treatment spots on face and forearm. Left forearm lesion without discharge measuring about 3 cm in diameter.   Neurological:     Mental Status: He is alert and oriented to person, place, and time.  Psychiatric:        Mood and Affect: Mood normal.        No results found for any visits on 03/17/24.  The ASCVD Risk score (Arnett DK, et al., 2019) failed to calculate for the following reasons:   The 2019 ASCVD risk score is only valid for ages 16 to 46   Risk score cannot be calculated because patient has a medical history suggesting prior/existing ASCVD   * - Cholesterol units were assumed     Assessment & Plan:   Assessment & Plan Chronic cough Three-month history with phlegm. Smoking history significant, 20 years smoking history with about 3 pack/day, quit around 1984.  Recommend chest x-ray to r/o infection. Ordered chest x-ray to rule out infection or other pulmonary pathology. Consider CT scan if chest x-ray shows abnormalities. Orders:   DG Chest 2 View; Future  Prediabetes Previous A1c has been stable, repeat A1c before next office visit in 6 months, future lab ordered.  Orders:   HgB A1c; Future  Hyperlipidemia with target LDL less than 70 Check fasting lipid panel with next lab.  Has a h/o statin myopathy.  Continue Aspirin , take 81 mg daily.  Orders:   Lipid panel; Future  Intermittent diarrhea Patient reports intermittent diarrhea mostly after drinking ice-tea. Likely multifactorial caffeine, h/o colon surgery, h/o treatment for prostate cancer.  Managed with Imodium  as needed. No abdominal pain. Continue Imodium  2 mg as needed. Monitor frequency and consider re-evaluation and GI referral if symptoms worsen. Orders:   Comp Met (CMET); Future   loperamide  (IMODIUM ) 2 MG capsule; Take 1 capsule (2 mg total) by mouth as needed for  diarrhea or loose stools.  Personal history of gout Stable without recent flare-ups, continue Allopurinol  300 mg daily.  Orders:   Uric acid; Future   allopurinol  (ZYLOPRIM ) 300 MG tablet; Take 1 tablet (300 mg total) by mouth daily.  History of peptic ulcer disease No abdominal pain, concerns for GI bleed at this time.     Coronary artery disease involving native coronary artery of native heart without angina pectoris No chest pain, palpitations, reduced exercise tolerance. Intolerant to statin due to myopathy, recommend taking aspirin  81 mg daily.     History of colon cancer In 2023, s/p right hemicolectomy with primary anastomosis in July 2023. No new concerns at this time. No fever, chills, unintentional weight loss.     Trigger finger, left ring finger Continue follow up for symptomatic and potential surgical release with [facility].     Skin lesions Continue follow up with dermatology.     Return in 6 months (on 09/15/2024) for chronic follow up, fasting labs 2 days before apt.   Luke Shade, MD "

## 2024-03-17 NOTE — Telephone Encounter (Signed)
 1. Intermittent diarrhea - loperamide  (IMODIUM ) 2 MG capsule; Take 1 capsule (2 mg total) by mouth daily as needed for diarrhea or loose stools.  Dispense: 90 capsule; Refill: 2  Bonetta Mostek, MD

## 2024-03-17 NOTE — Assessment & Plan Note (Deleted)
 SABRA

## 2024-03-17 NOTE — Assessment & Plan Note (Addendum)
 Stable without recent flare-ups, continue Allopurinol  300 mg daily.  Orders:   Uric acid; Future   allopurinol  (ZYLOPRIM ) 300 MG tablet; Take 1 tablet (300 mg total) by mouth daily.

## 2024-03-17 NOTE — Patient Instructions (Signed)
 We will update your chest x-ray today.  Recommend fasting lab before next visit with me in 6 months, I have ordered labs.

## 2024-03-17 NOTE — Assessment & Plan Note (Signed)
 Continue follow up with dermatology.

## 2024-03-17 NOTE — Assessment & Plan Note (Addendum)
 Patient reports intermittent diarrhea mostly after drinking ice-tea. Likely multifactorial caffeine, h/o colon surgery, h/o treatment for prostate cancer.  Managed with Imodium  as needed. No abdominal pain. Continue Imodium  2 mg as needed. Monitor frequency and consider re-evaluation and GI referral if symptoms worsen. Orders:   Comp Met (CMET); Future   loperamide  (IMODIUM ) 2 MG capsule; Take 1 capsule (2 mg total) by mouth as needed for diarrhea or loose stools.

## 2024-03-17 NOTE — Assessment & Plan Note (Deleted)
 Phillip Nelson

## 2024-03-17 NOTE — Telephone Encounter (Signed)
 Copied from CRM #[id number]. Topic: Clinical - Prescription Issue >> Mar 17, 2024  3:12 PM Ashley R wrote: Reason for CRM: loperamide  (IMODIUM ) 2 MG capsule need an amount per day, [facility] callback [phone] or fax [phone]

## 2024-03-17 NOTE — Assessment & Plan Note (Addendum)
 Three-month history with phlegm. Smoking history significant, 20 years smoking history with about 3 pack/day, quit around 1984.  Recommend chest x-ray to r/o infection. Ordered chest x-ray to rule out infection or other pulmonary pathology. Consider CT scan if chest x-ray shows abnormalities. Orders:   DG Chest 2 View; Future

## 2024-03-17 NOTE — Assessment & Plan Note (Addendum)
 Check fasting lipid panel with next lab.  Has a h/o statin myopathy.  Continue Aspirin , take 81 mg daily.  Orders:   Lipid panel; Future

## 2024-03-17 NOTE — Addendum Note (Signed)
 Addended by: Jasmyne Lodato on: 03/17/2024 05:20 PM   Modules accepted: Orders

## 2024-03-17 NOTE — Assessment & Plan Note (Addendum)
 No chest pain, palpitations, reduced exercise tolerance. Intolerant to statin due to myopathy, recommend taking aspirin  81 mg daily.

## 2024-03-17 NOTE — Assessment & Plan Note (Addendum)
 Continue follow up for symptomatic and potential surgical release with [facility].

## 2024-03-17 NOTE — Assessment & Plan Note (Addendum)
 Previous A1c has been stable, repeat A1c before next office visit in 6 months, future lab ordered.  Orders:   HgB A1c; Future

## 2024-03-17 NOTE — Assessment & Plan Note (Addendum)
 In 2023, s/p right hemicolectomy with primary anastomosis in July 2023. No new concerns at this time. No fever, chills, unintentional weight loss.

## 2024-03-17 NOTE — Assessment & Plan Note (Addendum)
 No abdominal pain, concerns for GI bleed at this time.

## 2024-03-21 NOTE — Telephone Encounter (Signed)
 Copied from CRM #[id number]. Topic: Clinical - Lab/Test Results >> Mar 21, 2024 12:26 PM Viola F wrote: Reason for CRM: Patient would like call today with chest x-ray results. Please call him at [phone]

## 2024-03-21 NOTE — Telephone Encounter (Signed)
 Patient notified of recent imaging results and recommendations per Dr Abbey. Patient verbalized understanding and has no further questions at this time.

## 2024-06-09 IMAGING — CT CT CHEST-ABD-PELV W/ CM
3 of 5 series · 15 of 36 positions shown, 17 images · IV contrast (agent unspecified)
Comparison: AP only CT on 11/20/2018

CLINICAL DATA: Newly diagnosed colon carcinoma.  Staging.

* Tracking Code: BO *
EXAM:
CT CHEST, ABDOMEN, AND PELVIS WITH CONTRAST
TECHNIQUE: Multidetector CT imaging of the chest, abdomen and pelvis was
performed following the standard protocol during bolus
administration of intravenous contrast.

[Series 2: cap with · axial · 0.86mm/px · z∈[-979,-424]mm · 9 of 139 slices shown, 11 images]
[im 14/139  mediastinal]
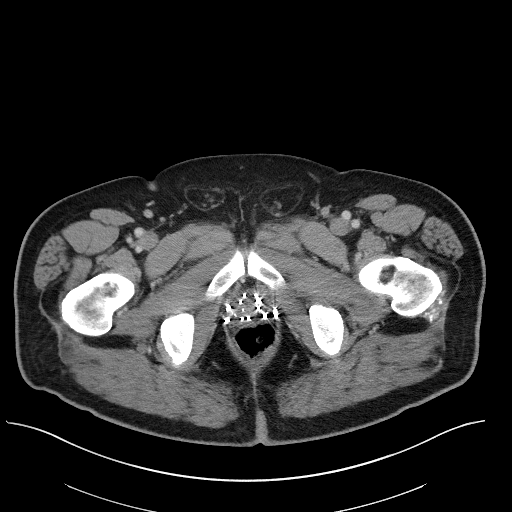
[im 14/139  bone]
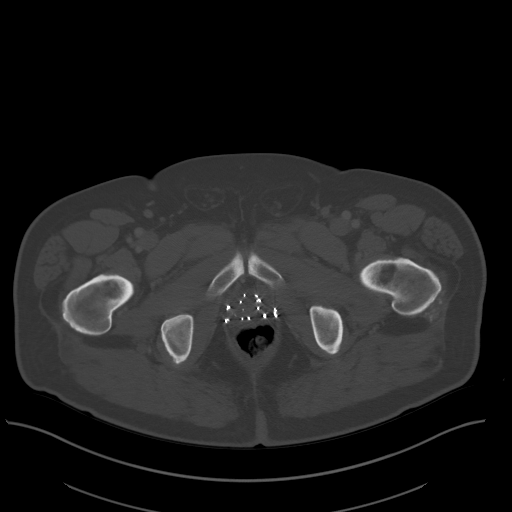
[im 28/139  mediastinal]
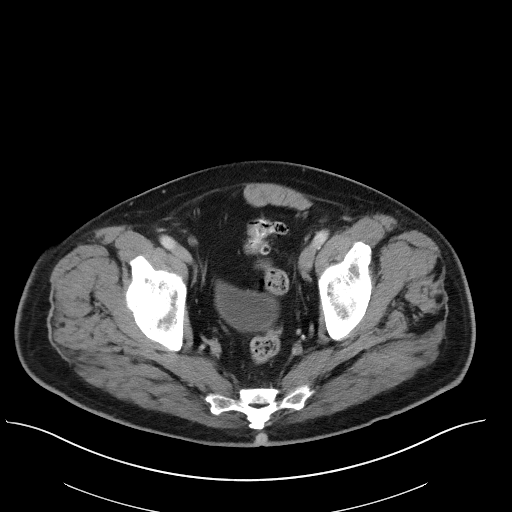
[im 42/139  mediastinal]
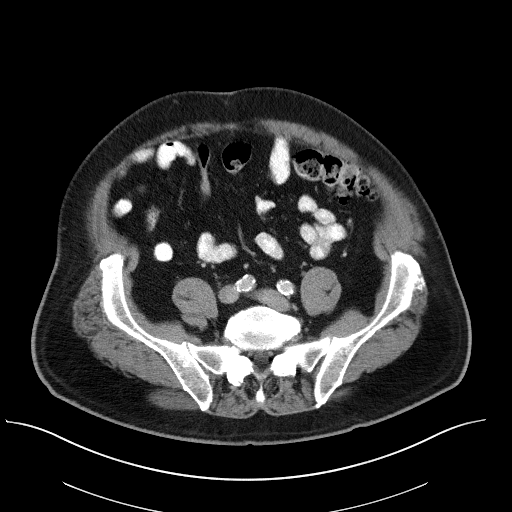
[im 56/139  mediastinal]
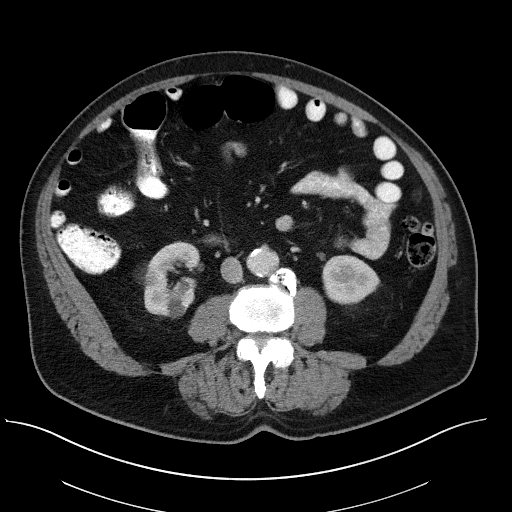
[im 70/139  mediastinal]
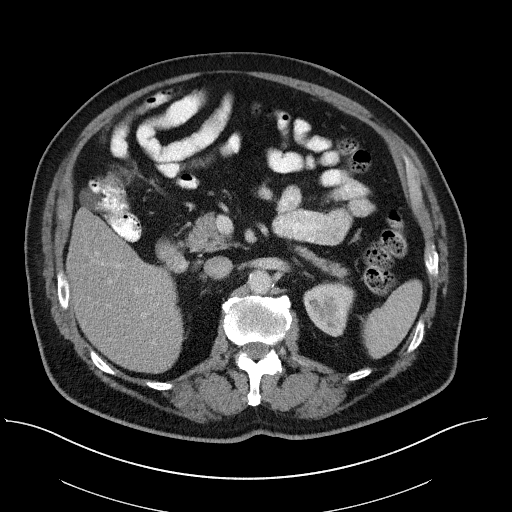
[im 83/139  mediastinal]
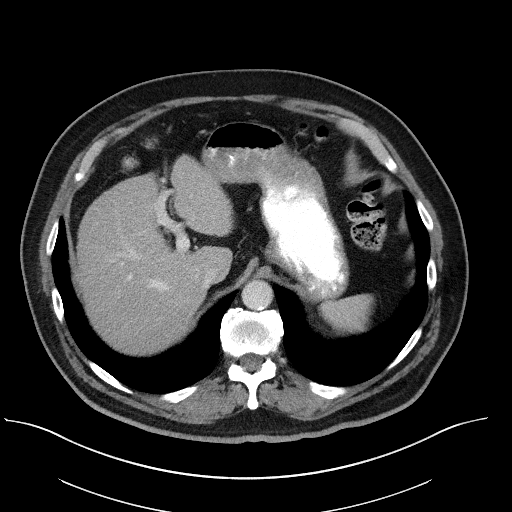
[im 97/139  mediastinal]
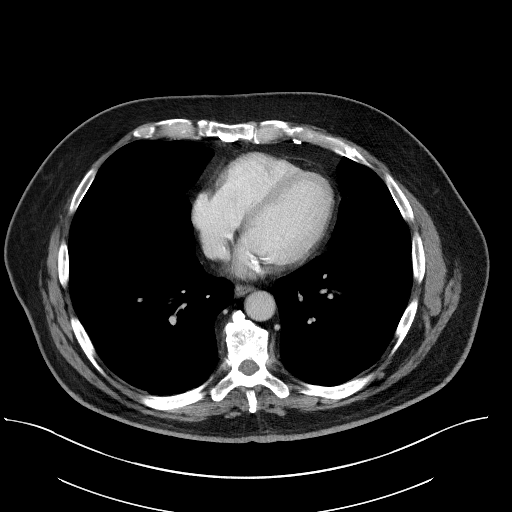
[im 111/139  mediastinal]
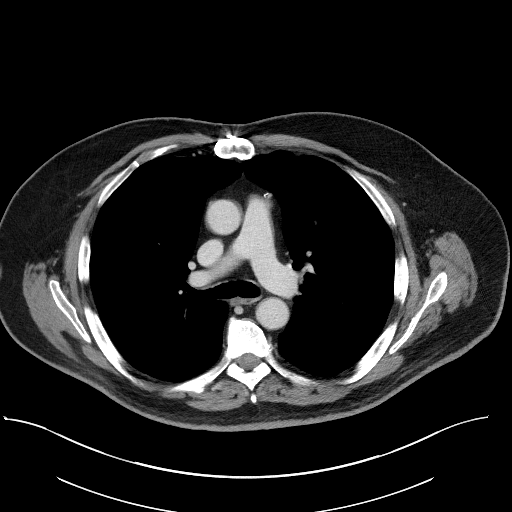
[im 125/139  mediastinal]
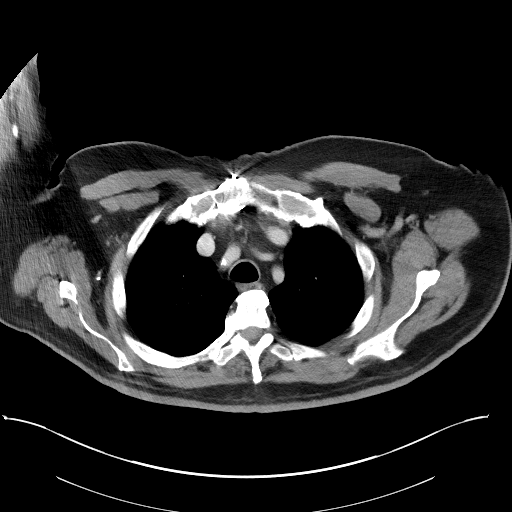
[im 125/139  bone]
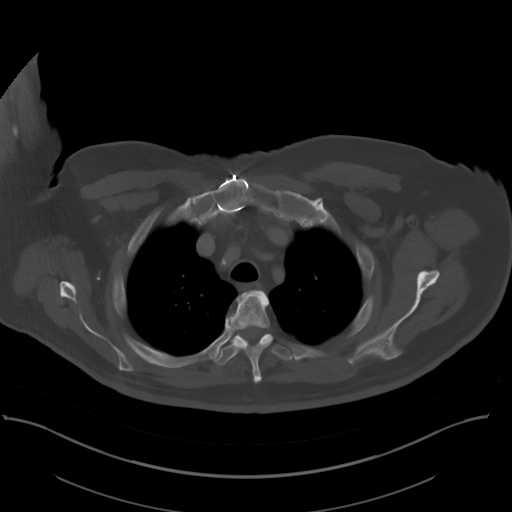

[Series 4: lung · axial · 0.86mm/px · z∈[-694,-610]mm · 3 of 185 slices shown]
[im 15/185  bone]
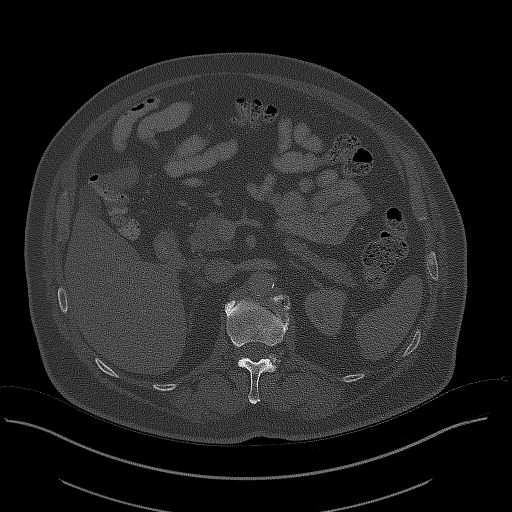
[im 43/185  bone]
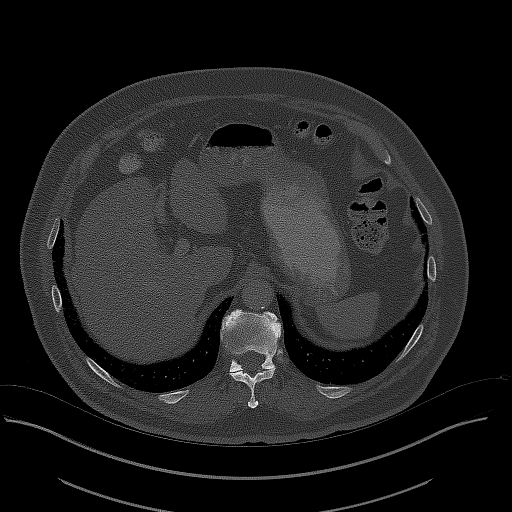
[im 57/185  bone]
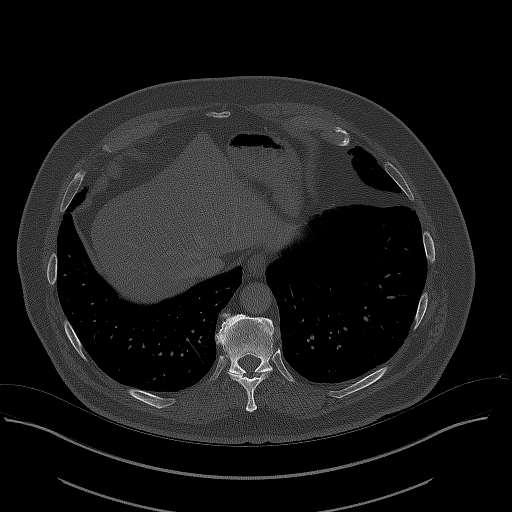

[Series 5: coronals · coronal · 0.97mm/px · 3 of 180 slices shown]
[im 36/180  mediastinal]
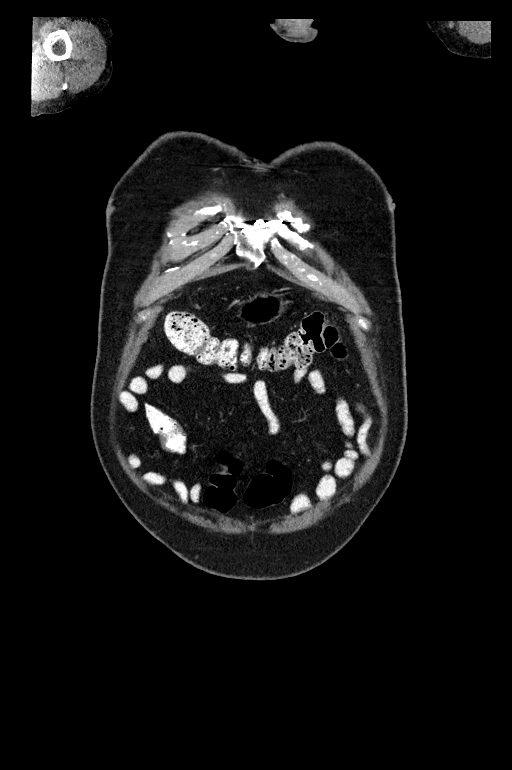
[im 72/180  mediastinal]
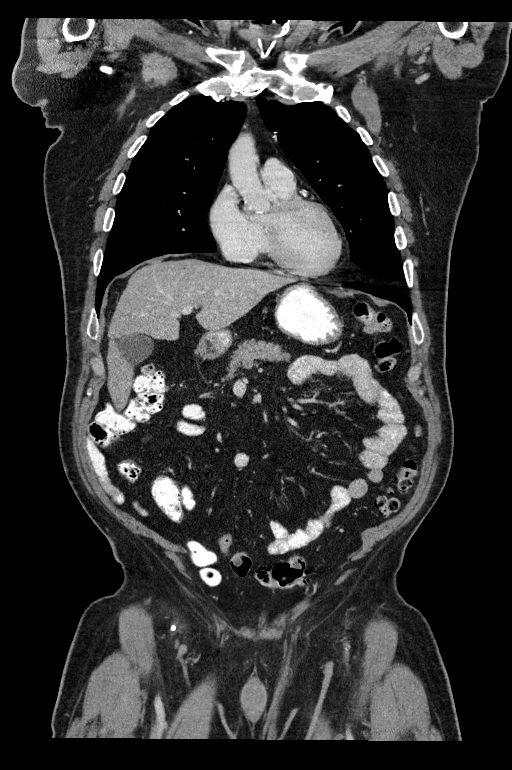
[im 108/180  mediastinal]
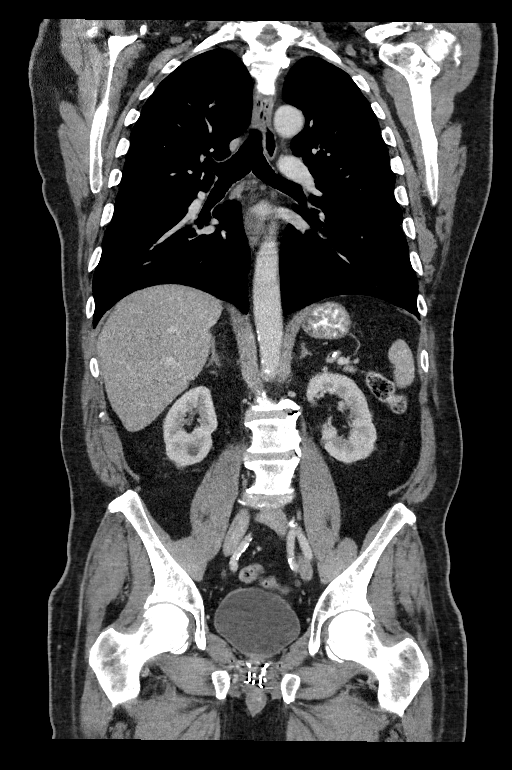

[15 of 36 positions shown; findings below may reference images not displayed]

RADIATION DOSE REDUCTION: This exam was performed according to the
departmental dose-optimization program which includes automated
exposure control, adjustment of the mA and/or kV according to
patient size and/or use of iterative reconstruction technique.

CONTRAST:  100mL OMNIPAQUE IOHEXOL 300 MG/ML  SOLN
FINDINGS: CT CHEST FINDINGS

Cardiovascular: No acute findings. Aortic and coronary
atherosclerotic calcification incidentally noted.

Mediastinum/Lymph Nodes: No masses or pathologically enlarged lymph
nodes identified.

Lungs/Pleura: No suspicious pulmonary nodules or masses identified.
No evidence of infiltrate or pleural effusion.

Musculoskeletal:  No suspicious bone lesions identified.

CT ABDOMEN AND PELVIS FINDINGS

Hepatobiliary: No masses identified. Gallbladder is unremarkable. No
evidence of biliary ductal dilatation.

Pancreas:  No mass or inflammatory changes.

Spleen:  Within normal limits in size and appearance.

Adrenals/Urinary tract: No masses or hydronephrosis. Several
benign-appearing right renal cysts show no significant change (no
followup imaging recommended).

Stomach/Bowel: Short-segment area of annular wall thickening is seen
involving the hepatic flexure of the colon, consistent with known
primary colon carcinoma. No evidence of bowel obstruction. Colonic
diverticulosis is noted, however there is no evidence of
diverticulitis.

Vascular/Lymphatic: No pathologically enlarged lymph nodes
identified. No acute vascular findings. Aortic atherosclerotic
calcification incidentally noted.

Reproductive: Brachytherapy seeds noted throughout the prostate
gland. No mass or other significant abnormality identified.

Other:  None.

Musculoskeletal:  No suspicious bone lesions identified.
IMPRESSION: Short-segment annular wall thickening involving the hepatic flexure
of the colon, consistent with known primary colon carcinoma.

No evidence of metastatic disease.

Colonic diverticulosis. No radiographic evidence of diverticulitis.

Aortic Atherosclerosis (43RH1-PTH.H).

## 2024-09-17 ENCOUNTER — Ambulatory Visit

## 2024-09-22 ENCOUNTER — Other Ambulatory Visit

## 2024-09-24 ENCOUNTER — Ambulatory Visit
